# Patient Record
Sex: Female | Born: 1960 | Race: Black or African American | Hispanic: No | Marital: Single | Smoking: Former smoker
Health system: Southern US, Community
[De-identification: ages and names within clinical notes are randomized; demographics above are authoritative.]

## PROBLEM LIST (undated history)

## (undated) DIAGNOSIS — F22 Delusional disorders: Secondary | ICD-10-CM

## (undated) DIAGNOSIS — Z87442 Personal history of urinary calculi: Secondary | ICD-10-CM

## (undated) DIAGNOSIS — J45909 Unspecified asthma, uncomplicated: Secondary | ICD-10-CM

## (undated) DIAGNOSIS — E785 Hyperlipidemia, unspecified: Secondary | ICD-10-CM

## (undated) DIAGNOSIS — D509 Iron deficiency anemia, unspecified: Secondary | ICD-10-CM

## (undated) DIAGNOSIS — B1920 Unspecified viral hepatitis C without hepatic coma: Secondary | ICD-10-CM

## (undated) DIAGNOSIS — G4733 Obstructive sleep apnea (adult) (pediatric): Secondary | ICD-10-CM

## (undated) DIAGNOSIS — F250 Schizoaffective disorder, bipolar type: Secondary | ICD-10-CM

## (undated) DIAGNOSIS — E669 Obesity, unspecified: Secondary | ICD-10-CM

## (undated) DIAGNOSIS — F32A Depression, unspecified: Secondary | ICD-10-CM

## (undated) DIAGNOSIS — J449 Chronic obstructive pulmonary disease, unspecified: Secondary | ICD-10-CM

## (undated) DIAGNOSIS — E119 Type 2 diabetes mellitus without complications: Secondary | ICD-10-CM

## (undated) HISTORY — DX: Chronic obstructive pulmonary disease, unspecified: J44.9

## (undated) HISTORY — DX: Schizoaffective disorder, bipolar type: F25.0

## (undated) HISTORY — PX: UMBILICAL HERNIA REPAIR: SHX196

## (undated) HISTORY — PX: COLONOSCOPY: SHX174

## (undated) HISTORY — DX: Unspecified asthma, uncomplicated: J45.909

## (undated) HISTORY — DX: Obesity, unspecified: E66.9

## (undated) HISTORY — DX: Delusional disorders: F22

## (undated) HISTORY — DX: Obstructive sleep apnea (adult) (pediatric): G47.33

## (undated) HISTORY — DX: Type 2 diabetes mellitus without complications: E11.9

## (undated) HISTORY — DX: Iron deficiency anemia, unspecified: D50.9

## (undated) HISTORY — DX: Unspecified viral hepatitis C without hepatic coma: B19.20

## (undated) HISTORY — PX: CHOLECYSTECTOMY: SHX55

## (undated) HISTORY — DX: Hyperlipidemia, unspecified: E78.5

## (undated) HISTORY — DX: Depression, unspecified: F32.A

---

## 2000-06-19 HISTORY — PX: INGUINAL HERNIA REPAIR: SUR1180

## 2011-06-20 HISTORY — PX: BREAST CYST EXCISION: SHX579

## 2012-06-19 DIAGNOSIS — I639 Cerebral infarction, unspecified: Secondary | ICD-10-CM

## 2012-06-19 HISTORY — DX: Cerebral infarction, unspecified: I63.9

## 2018-06-19 HISTORY — PX: ESOPHAGOGASTRODUODENOSCOPY: SHX1529

## 2020-01-20 ENCOUNTER — Emergency Department: Payer: Medicaid Other

## 2020-01-20 ENCOUNTER — Inpatient Hospital Stay
Admission: EM | Admit: 2020-01-20 | Discharge: 2020-01-30 | DRG: 637 | Disposition: A | Discharge status: Home Health | Payer: Medicaid Other | Attending: Internal Medicine | Admitting: Internal Medicine

## 2020-01-20 DIAGNOSIS — R509 Fever, unspecified: Secondary | ICD-10-CM

## 2020-01-20 DIAGNOSIS — E1111 Type 2 diabetes mellitus with ketoacidosis with coma: Secondary | ICD-10-CM | POA: Diagnosis present

## 2020-01-20 DIAGNOSIS — E870 Hyperosmolality and hypernatremia: Secondary | ICD-10-CM | POA: Diagnosis not present

## 2020-01-20 DIAGNOSIS — Z6832 Body mass index (BMI) 32.0-32.9, adult: Secondary | ICD-10-CM | POA: Diagnosis not present

## 2020-01-20 DIAGNOSIS — N170 Acute kidney failure with tubular necrosis: Secondary | ICD-10-CM | POA: Diagnosis present

## 2020-01-20 DIAGNOSIS — G92 Toxic encephalopathy: Secondary | ICD-10-CM | POA: Diagnosis present

## 2020-01-20 DIAGNOSIS — Z794 Long term (current) use of insulin: Secondary | ICD-10-CM | POA: Diagnosis not present

## 2020-01-20 DIAGNOSIS — R140 Abdominal distension (gaseous): Secondary | ICD-10-CM

## 2020-01-20 DIAGNOSIS — E1110 Type 2 diabetes mellitus with ketoacidosis without coma: Secondary | ICD-10-CM | POA: Diagnosis present

## 2020-01-20 DIAGNOSIS — F319 Bipolar disorder, unspecified: Secondary | ICD-10-CM | POA: Diagnosis present

## 2020-01-20 DIAGNOSIS — J690 Pneumonitis due to inhalation of food and vomit: Secondary | ICD-10-CM | POA: Diagnosis present

## 2020-01-20 DIAGNOSIS — E871 Hypo-osmolality and hyponatremia: Secondary | ICD-10-CM | POA: Diagnosis present

## 2020-01-20 DIAGNOSIS — J9601 Acute respiratory failure with hypoxia: Secondary | ICD-10-CM | POA: Diagnosis present

## 2020-01-20 DIAGNOSIS — Z20822 Contact with and (suspected) exposure to covid-19: Secondary | ICD-10-CM | POA: Diagnosis present

## 2020-01-20 DIAGNOSIS — Z79899 Other long term (current) drug therapy: Secondary | ICD-10-CM | POA: Diagnosis not present

## 2020-01-20 DIAGNOSIS — E875 Hyperkalemia: Secondary | ICD-10-CM | POA: Diagnosis present

## 2020-01-20 DIAGNOSIS — E861 Hypovolemia: Secondary | ICD-10-CM | POA: Diagnosis present

## 2020-01-20 DIAGNOSIS — J9600 Acute respiratory failure, unspecified whether with hypoxia or hypercapnia: Secondary | ICD-10-CM

## 2020-01-20 DIAGNOSIS — R579 Shock, unspecified: Secondary | ICD-10-CM | POA: Diagnosis present

## 2020-01-20 DIAGNOSIS — Z9114 Patient's other noncompliance with medication regimen: Secondary | ICD-10-CM | POA: Diagnosis not present

## 2020-01-20 DIAGNOSIS — J9602 Acute respiratory failure with hypercapnia: Secondary | ICD-10-CM | POA: Diagnosis present

## 2020-01-20 DIAGNOSIS — E0811 Diabetes mellitus due to underlying condition with ketoacidosis with coma: Secondary | ICD-10-CM | POA: Diagnosis not present

## 2020-01-20 DIAGNOSIS — E872 Acidosis: Secondary | ICD-10-CM | POA: Diagnosis present

## 2020-01-20 DIAGNOSIS — Z978 Presence of other specified devices: Secondary | ICD-10-CM

## 2020-01-20 LAB — CBC
HCT: 40.1 % (ref 36.0–46.0)
Hemoglobin: 12.9 g/dL (ref 12.0–15.0)
MCH: 32 pg (ref 26.0–34.0)
MCHC: 32.2 g/dL (ref 30.0–36.0)
MCV: 99.5 fL (ref 80.0–100.0)
Platelets: 231 10*3/uL (ref 150–400)
RBC: 4.03 MIL/uL (ref 3.87–5.11)
RDW: 13.5 % (ref 11.5–15.5)
WBC: 15.1 10*3/uL — ABNORMAL HIGH (ref 4.0–10.5)
nRBC: 0.1 % (ref 0.0–0.2)

## 2020-01-20 LAB — BLOOD GAS, VENOUS
Acid-base deficit: 26 mmol/L — ABNORMAL HIGH (ref 0.0–2.0)
Bicarbonate: 6.1 mmol/L — ABNORMAL LOW (ref 20.0–28.0)
O2 Saturation: 13.1 %
Patient temperature: 37
pCO2, Ven: 31 mmHg — ABNORMAL LOW (ref 44.0–60.0)
pH, Ven: 6.9 — CL (ref 7.250–7.430)
pO2, Ven: <31 mmHg — CL (ref 32.0–45.0)

## 2020-01-20 LAB — COMPREHENSIVE METABOLIC PANEL
ALT: 23 U/L (ref 0–44)
AST: 26 U/L (ref 15–41)
Albumin: 4.2 g/dL (ref 3.5–5.0)
Alkaline Phosphatase: 89 U/L (ref 38–126)
BUN: 27 mg/dL — ABNORMAL HIGH (ref 6–20)
CO2: <7 mmol/L — ABNORMAL LOW (ref 22–32)
Calcium: 9.6 mg/dL (ref 8.9–10.3)
Chloride: 89 mmol/L — ABNORMAL LOW (ref 98–111)
Creatinine, Ser: 2.41 mg/dL — ABNORMAL HIGH (ref 0.44–1.00)
GFR calc Af Amer: 24 mL/min — ABNORMAL LOW (ref >60)
GFR calc non Af Amer: 21 mL/min — ABNORMAL LOW (ref >60)
Glucose, Bld: 800 mg/dL (ref 70–99)
Potassium: 6.1 mmol/L — ABNORMAL HIGH (ref 3.5–5.1)
Sodium: 123 mmol/L — ABNORMAL LOW (ref 135–145)
Total Bilirubin: 2.5 mg/dL — ABNORMAL HIGH (ref 0.3–1.2)
Total Protein: 7.6 g/dL (ref 6.5–8.1)

## 2020-01-20 LAB — TROPONIN I (HIGH SENSITIVITY): Troponin I (High Sensitivity): 15 ng/L (ref <18)

## 2020-01-20 LAB — LACTIC ACID, PLASMA: Lactic Acid, Venous: 3.5 mmol/L (ref 0.5–1.9)

## 2020-01-20 LAB — BETA-HYDROXYBUTYRIC ACID: Beta-Hydroxybutyric Acid: >8 mmol/L — ABNORMAL HIGH (ref 0.05–0.27)

## 2020-01-20 IMAGING — DX DG CHEST 1V PORT
1 series · 1 of 1 positions shown · non-contrast
Comparison: None.

CLINICAL DATA: Altered mental status

EXAM:
PORTABLE CHEST 1 VIEW

[chest ap]
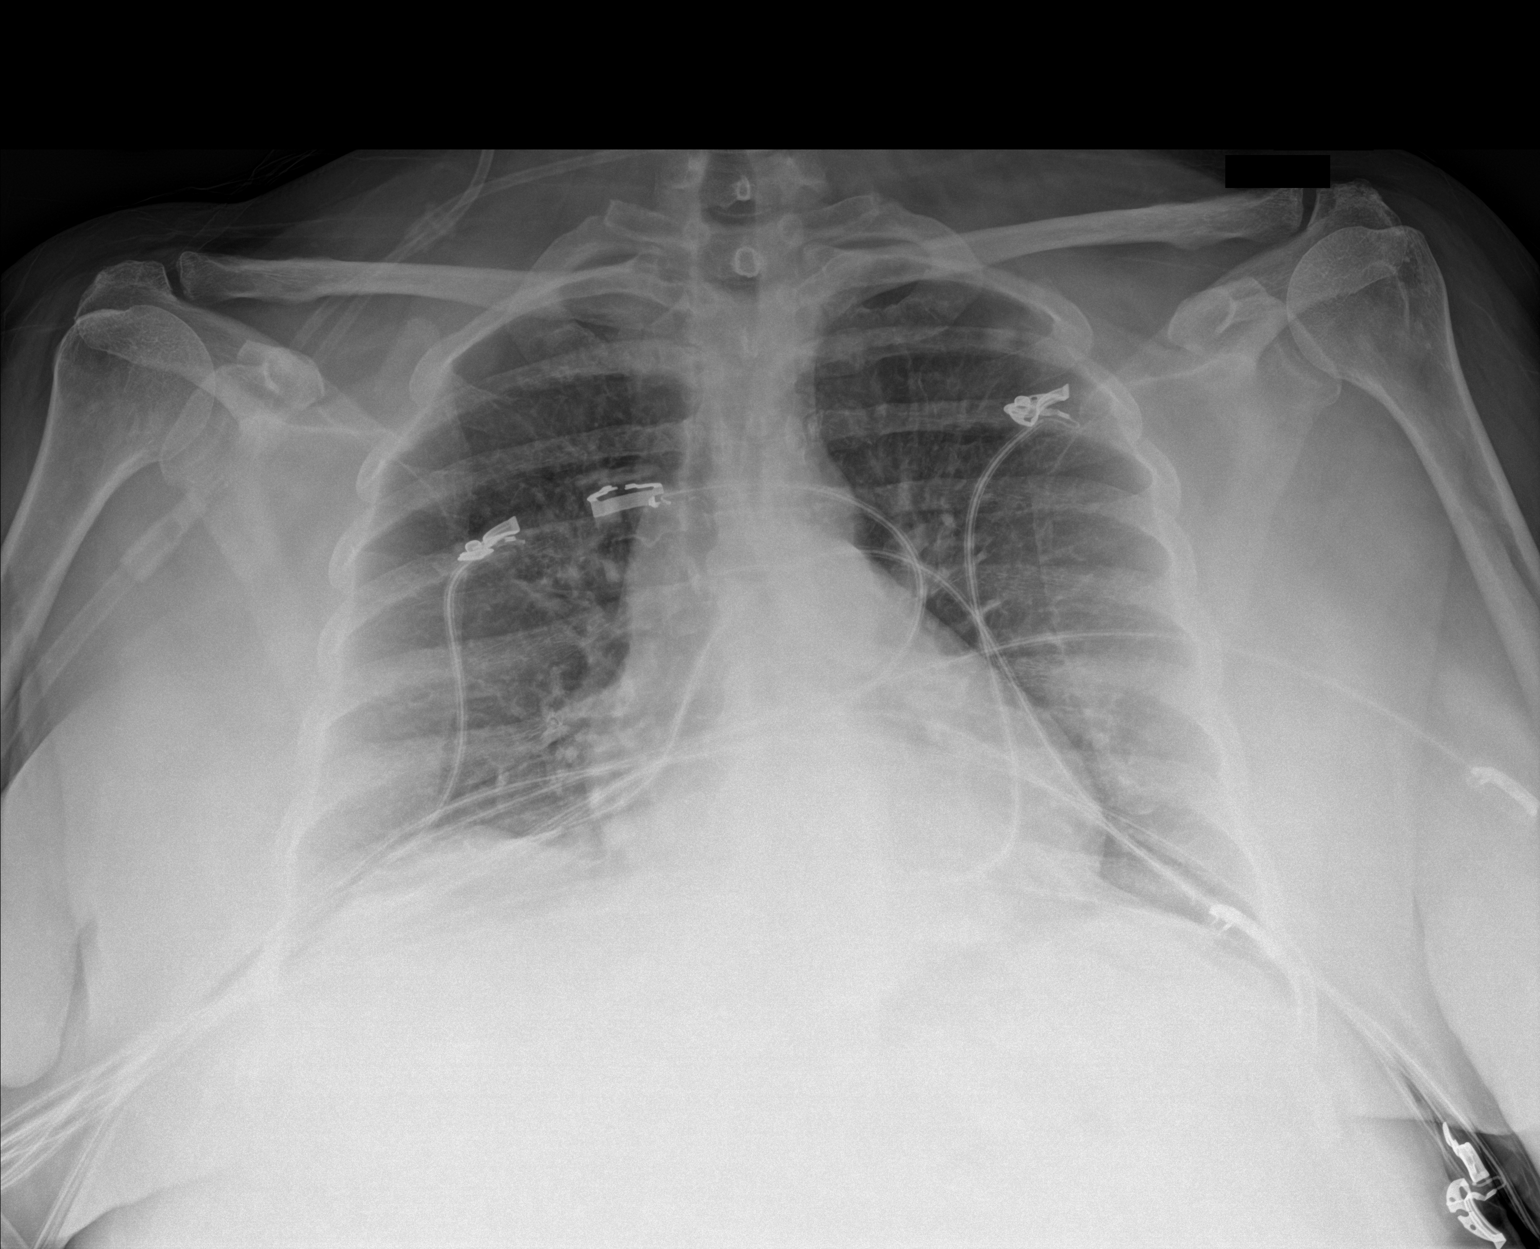

[1 of 1 positions shown; findings below may reference images not displayed]

FINDINGS: Possible patchy airspace opacity in the left lower lung. Normal
heart size. No pneumothorax.
IMPRESSION: Possible patchy airspace opacity/mild pneumonia in the left lower
lung.

## 2020-01-20 MED ORDER — SODIUM CHLORIDE 0.9 % IV SOLN: INTRAVENOUS | Status: DC

## 2020-01-20 MED ORDER — SODIUM CHLORIDE 0.9 % IV SOLN
1000.0000 mL | Freq: Once | INTRAVENOUS | Status: AC
  Administered 2020-01-20: 1000 mL via INTRAVENOUS

## 2020-01-20 MED ORDER — ONDANSETRON HCL 4 MG/2ML IJ SOLN
4.0000 mg | Freq: Four times a day (QID) | INTRAMUSCULAR | Status: DC | PRN
  Administered 2020-01-21: 4 mg via INTRAVENOUS
  Filled 2020-01-20: qty 2

## 2020-01-20 MED ORDER — ACETAMINOPHEN 325 MG PO TABS: 650.0000 mg | ORAL_TABLET | ORAL | Status: DC | PRN

## 2020-01-20 MED ORDER — INSULIN REGULAR(HUMAN) IN NACL 100-0.9 UT/100ML-% IV SOLN
INTRAVENOUS | Status: DC
  Administered 2020-01-21: 5 [IU]/h via INTRAVENOUS
  Administered 2020-01-21: 21 [IU]/h via INTRAVENOUS
  Administered 2020-01-21: 5 [IU]/h via INTRAVENOUS
  Administered 2020-01-21: 8 [IU]/h via INTRAVENOUS
  Administered 2020-01-22: 15 [IU]/h via INTRAVENOUS
  Administered 2020-01-22: 23 [IU]/h via INTRAVENOUS
  Filled 2020-01-20 (×5): qty 100

## 2020-01-20 MED ORDER — DEXTROSE-NACL 5-0.45 % IV SOLN: INTRAVENOUS | Status: DC

## 2020-01-20 MED ORDER — HEPARIN SODIUM (PORCINE) 5000 UNIT/ML IJ SOLN
5000.0000 [IU] | Freq: Three times a day (TID) | INTRAMUSCULAR | Status: DC
  Administered 2020-01-21 – 2020-01-28 (×21): 5000 [IU] via SUBCUTANEOUS
  Filled 2020-01-20 (×20): qty 1

## 2020-01-20 MED ORDER — DEXTROSE 50 % IV SOLN: 0.0000 mL | INTRAVENOUS | Status: DC | PRN

## 2020-01-20 NOTE — ED Notes (Signed)
Patient began to jump out out of the bed. Patient said I need to go to the bathroom. I assisted patient to bathroom and when getting her back in bed she was jumping out the bed. Doctor her at bedside.

## 2020-01-20 NOTE — ED Provider Notes (Signed)
[facility] Emergency Department Provider Note   ____________________________________________    I have reviewed the triage vital signs and the nursing notes.   HISTORY  Chief Complaint Hyperglycemia  History limited by altered mental status   HPI Natalie [name] is a 59 y.o. female brought in by EMS unresponsive.  Apparently son told him that patient recently moved from [place], has not yet obtained insurance here and has been out of her insulin.  They report elevated glucose.  No past medical history on file.  There are no problems to display for this patient.     Prior to Admission medications   Medication Sig Start Date End Date Taking? Authorizing Provider  atorvastatin (LIPITOR) 40 MG tablet Take 40 mg by mouth at bedtime. 01/15/20  Yes [provider]  benztropine (COGENTIN) 1 MG tablet Take 1 tablet by mouth daily. 01/15/20  Yes [provider]  citalopram (CELEXA) 20 MG tablet Take 20 mg by mouth every morning. 01/15/20  Yes [provider]  JARDIANCE 25 MG TABS tablet Take 25 mg by mouth daily. 01/15/20  Yes [provider]  latanoprost (XALATAN) 0.005 % ophthalmic solution Place 1 drop into both eyes at bedtime. 01/15/20  Yes [provider]  LORazepam (ATIVAN) 1 MG tablet Take 1 mg by mouth 2 (two) times daily. 12/17/19  Yes [provider]  Multiple Vitamin (DAILY-VITE MULTIVITAMIN) TABS Take 1 tablet by mouth daily. 01/15/20  Yes [provider]  TRELEGY ELLIPTA 100-62.5-25 MCG/INH AEPB Inhale 1 puff into the lungs daily. 01/15/20  Yes [provider]  TRULICITY 1.5 PP/2.9JJ SOPN Inject 1.5 mg into the skin once a week. 01/15/20  Yes [provider]  ziprasidone (GEODON) 40 MG capsule Take 1 capsule by mouth at bedtime. 01/19/20  Yes [provider]     Allergies Patient has no known allergies.  No family history on file.  Social History  unable to obtain Social History   Tobacco Use  . Smoking status: Not on file  Substance Use Topics  . Alcohol use: Not on file  . Drug use: Not on file    Unable to obtain review of Systems     ____________________________________________   PHYSICAL EXAM:  VITAL SIGNS: ED Triage Vitals  Enc Vitals Group     BP 01/20/20 2213 (!) 126/115     Pulse Rate 01/20/20 2213 (!) 104     Resp 01/20/20 2213 (!) 24     Temp 01/20/20 2216 97.6 F (36.4 C)     Temp Source 01/20/20 2216 Axillary     SpO2 01/20/20 2212 96 %     Weight 01/20/20 2214 90.7 kg (200 lb)     Height 01/20/20 2214 1.651 m (5\' 5" )     Head Circumference --      Peak Flow --      Pain Score --      Pain Loc --      Pain Edu? --      Excl. in [place]? --     Constitutional: Unresponsive Eyes: Conjunctivae are normal.  Pinpoint pupils Head: Atraumatic. Nose: No epistaxis Mouth/Throat: Mucous membranes are moist.    Cardiovascular: Tachycardia, regular rhythm. Grossly normal heart sounds.  Good peripheral circulation. Respiratory: Tachypnea.  No retractions. Lungs CTAB. Gastrointestinal: Soft and nontender. No distention.  No CVA tenderness. Musculoskeletal:   Warm and well perfused Neurologic: Moving all extremities Skin:  Skin is warm, dry and intact. No rash noted. Psychiatric:  Mood and affect are normal. Speech and behavior are normal.  ____________________________________________   LABS (all labs ordered are listed, but only abnormal results are displayed)  Labs Reviewed  CBC - Abnormal; Notable for the following components:      Result Value   WBC 15.1 (*)    All other components within normal limits  COMPREHENSIVE METABOLIC PANEL - Abnormal; Notable for the following components:   Sodium 123 (*)    Potassium 6.1 (*)    Chloride 89 (*)    CO2 <7 (*)    Glucose, Bld 800 (*)    BUN 27 (*)    Creatinine, Ser 2.41 (*)    Total Bilirubin 2.5 (*)    GFR calc non Af Amer 21 (*)    GFR calc Af  Amer 24 (*)    All other components within normal limits  BLOOD GAS, VENOUS - Abnormal; Notable for the following components:   pH, Ven 6.90 (*)    pCO2, Ven 31 (*)    pO2, Ven <31.0 (*)    Bicarbonate 6.1 (*)    Acid-base deficit 26.0 (*)    All other components within normal limits  LACTIC ACID, PLASMA - Abnormal; Notable for the following components:   Lactic Acid, Venous 3.5 (*)    All other components within normal limits  LACTIC ACID, PLASMA  BETA-HYDROXYBUTYRIC ACID  URINALYSIS, COMPLETE (UACMP) WITH MICROSCOPIC  TROPONIN I (HIGH SENSITIVITY)   ____________________________________________  EKG  ED ECG REPORT I, Lavonia Drafts, the attending physician, personally viewed and interpreted this ECG.  Date: 01/20/2020  Rhythm: Sinus tachycardia QRS Axis: normal Intervals: normal ST/T Wave abnormalities: Abnormal Narrative Interpretation: no evidence of acute ischemia  ____________________________________________  RADIOLOGY  Chest x-ray pending ____________________________________________   PROCEDURES  Procedure(s) performed: yes   Angiocath insertion Performed by: Lavonia Drafts  Consent: Verbal consent obtained. Risks and benefits: risks, benefits and alternatives were discussed Time out: Immediately prior to procedure a "time out" was called to verify the correct patient, procedure, equipment, support staff and site/side marked as required.  Preparation: Patient was prepped and draped in the usual sterile fashion.  Vein Location: right brachial  Ultrasound Guided  Gauge: 18  Normal blood return and flush without difficulty Patient tolerance: Patient tolerated the procedure well with no immediate complications.      Marland Kitchen1-3 Lead EKG Interpretation Performed by: Lavonia Drafts, MD Authorized by: Lavonia Drafts, MD     Interpretation: normal     ECG rate assessment: tachycardic     Rhythm: sinus tachycardia     Ectopy: none     Conduction: normal        Critical Care performed: yes  CRITICAL CARE Performed by: Lavonia Drafts   Total critical care time: 35 minutes  Critical care time was exclusive of separately billable procedures and treating other patients.  Critical care was necessary to treat or prevent imminent or life-threatening deterioration.  Critical care was time spent personally by me on the following activities: development of treatment plan with patient and/or surrogate as well as nursing, discussions with consultants, evaluation of patient's response to treatment, examination of patient, obtaining history from patient or surrogate, ordering and performing treatments and interventions, ordering and review of laboratory studies, ordering and review of radiographic studies, pulse oximetry and re-evaluation of patient's condition.  ____________________________________________   INITIAL IMPRESSION / ASSESSMENT AND PLAN / ED COURSE  Pertinent labs & imaging results that were available during my care of the patient were reviewed by  me and considered in my medical decision making (see chart for details).  Patient presents with altered mental status.  Found to have pinpoint pupils.  EMS has some concerns about track marks  Differential includes substance abuse, DKA, sepsis  Patient given 1 mg of IV Narcan with some improvement in responsiveness.  IV fluids infusing  Patient with likely Kussmaul breathing, strongly suspicious for DKA.  Glucometer reads greater than 600  VBG obtained demonstrates pH of 6.9  CMP is resulted demonstrating glucose of 800, sodium of 123, potassium 6.1, creatinine of 2.41  Consistent with DKA, insulin drip ordered  Discussed with son who confirms patient has been unable to get her medications  ----------------------------------------- 11:20 PM on 01/20/2020 -----------------------------------------  Patient is much more alert now, she has been able to sit on the toilet on her  own    ____________________________________________   FINAL CLINICAL IMPRESSION(S) / ED DIAGNOSES  Final diagnoses:  Diabetic ketoacidosis with coma associated with type 2 diabetes mellitus ([place])        Note:  This document was prepared using Dragon voice recognition software and may include unintentional dictation errors.   Lavonia Drafts, MD 01/20/20 [phone]

## 2020-01-20 NOTE — ED Triage Notes (Signed)
Pt from home via [facility] after being found tonight by son to be responsive. Pt family reports patient has been out of insulin for days and unable to get more.   Pt 70's% o2 RA per EMS 98% NRB  CBG 570 per EMS >600 on arrival  Pt presents with needle tracks on arms 1mg  narcan on arrival with slight improvement noted

## 2020-01-21 ENCOUNTER — Inpatient Hospital Stay: Payer: Medicaid Other

## 2020-01-21 DIAGNOSIS — E1110 Type 2 diabetes mellitus with ketoacidosis without coma: Secondary | ICD-10-CM

## 2020-01-21 DIAGNOSIS — J9601 Acute respiratory failure with hypoxia: Secondary | ICD-10-CM

## 2020-01-21 LAB — BASIC METABOLIC PANEL
Anion gap: 24 — ABNORMAL HIGH (ref 5–15)
Anion gap: 21 — ABNORMAL HIGH (ref 5–15)
Anion gap: 17 — ABNORMAL HIGH (ref 5–15)
Anion gap: 18 — ABNORMAL HIGH (ref 5–15)
BUN: 29 mg/dL — ABNORMAL HIGH (ref 6–20)
BUN: 37 mg/dL — ABNORMAL HIGH (ref 6–20)
BUN: 40 mg/dL — ABNORMAL HIGH (ref 6–20)
BUN: 41 mg/dL — ABNORMAL HIGH (ref 6–20)
BUN: 43 mg/dL — ABNORMAL HIGH (ref 6–20)
CO2: <7 mmol/L — ABNORMAL LOW (ref 22–32)
CO2: 14 mmol/L — ABNORMAL LOW (ref 22–32)
CO2: 17 mmol/L — ABNORMAL LOW (ref 22–32)
CO2: 20 mmol/L — ABNORMAL LOW (ref 22–32)
CO2: 17 mmol/L — ABNORMAL LOW (ref 22–32)
Calcium: 8.6 mg/dL — ABNORMAL LOW (ref 8.9–10.3)
Calcium: 8.6 mg/dL — ABNORMAL LOW (ref 8.9–10.3)
Calcium: 8.3 mg/dL — ABNORMAL LOW (ref 8.9–10.3)
Calcium: 8.3 mg/dL — ABNORMAL LOW (ref 8.9–10.3)
Calcium: 7.9 mg/dL — ABNORMAL LOW (ref 8.9–10.3)
Chloride: 96 mmol/L — ABNORMAL LOW (ref 98–111)
Chloride: 97 mmol/L — ABNORMAL LOW (ref 98–111)
Chloride: 98 mmol/L (ref 98–111)
Chloride: 99 mmol/L (ref 98–111)
Chloride: 101 mmol/L (ref 98–111)
Creatinine, Ser: 2.25 mg/dL — ABNORMAL HIGH (ref 0.44–1.00)
Creatinine, Ser: 2.43 mg/dL — ABNORMAL HIGH (ref 0.44–1.00)
Creatinine, Ser: 2.66 mg/dL — ABNORMAL HIGH (ref 0.44–1.00)
Creatinine, Ser: 3.08 mg/dL — ABNORMAL HIGH (ref 0.44–1.00)
Creatinine, Ser: 3.27 mg/dL — ABNORMAL HIGH (ref 0.44–1.00)
GFR calc Af Amer: 27 mL/min — ABNORMAL LOW (ref >60)
GFR calc Af Amer: 24 mL/min — ABNORMAL LOW (ref >60)
GFR calc Af Amer: 22 mL/min — ABNORMAL LOW (ref >60)
GFR calc Af Amer: 18 mL/min — ABNORMAL LOW (ref >60)
GFR calc Af Amer: 17 mL/min — ABNORMAL LOW (ref >60)
GFR calc non Af Amer: 23 mL/min — ABNORMAL LOW (ref >60)
GFR calc non Af Amer: 21 mL/min — ABNORMAL LOW (ref >60)
GFR calc non Af Amer: 19 mL/min — ABNORMAL LOW (ref >60)
GFR calc non Af Amer: 16 mL/min — ABNORMAL LOW (ref >60)
GFR calc non Af Amer: 15 mL/min — ABNORMAL LOW (ref >60)
Glucose, Bld: 720 mg/dL (ref 70–99)
Glucose, Bld: 403 mg/dL — ABNORMAL HIGH (ref 70–99)
Glucose, Bld: 262 mg/dL — ABNORMAL HIGH (ref 70–99)
Glucose, Bld: 250 mg/dL — ABNORMAL HIGH (ref 70–99)
Glucose, Bld: 298 mg/dL — ABNORMAL HIGH (ref 70–99)
Potassium: 4.9 mmol/L (ref 3.5–5.1)
Potassium: 3.9 mmol/L (ref 3.5–5.1)
Potassium: 3.2 mmol/L — ABNORMAL LOW (ref 3.5–5.1)
Potassium: 3.8 mmol/L (ref 3.5–5.1)
Potassium: 3.6 mmol/L (ref 3.5–5.1)
Sodium: 126 mmol/L — ABNORMAL LOW (ref 135–145)
Sodium: 135 mmol/L (ref 135–145)
Sodium: 136 mmol/L (ref 135–145)
Sodium: 136 mmol/L (ref 135–145)
Sodium: 136 mmol/L (ref 135–145)

## 2020-01-21 LAB — GLUCOSE, CAPILLARY
Glucose-Capillary: >600 mg/dL (ref 70–99)
Glucose-Capillary: 491 mg/dL — ABNORMAL HIGH (ref 70–99)
Glucose-Capillary: 383 mg/dL — ABNORMAL HIGH (ref 70–99)
Glucose-Capillary: 396 mg/dL — ABNORMAL HIGH (ref 70–99)
Glucose-Capillary: 351 mg/dL — ABNORMAL HIGH (ref 70–99)
Glucose-Capillary: 308 mg/dL — ABNORMAL HIGH (ref 70–99)
Glucose-Capillary: 310 mg/dL — ABNORMAL HIGH (ref 70–99)
Glucose-Capillary: 254 mg/dL — ABNORMAL HIGH (ref 70–99)
Glucose-Capillary: 268 mg/dL — ABNORMAL HIGH (ref 70–99)
Glucose-Capillary: 220 mg/dL — ABNORMAL HIGH (ref 70–99)
Glucose-Capillary: 239 mg/dL — ABNORMAL HIGH (ref 70–99)
Glucose-Capillary: 262 mg/dL — ABNORMAL HIGH (ref 70–99)
Glucose-Capillary: 302 mg/dL — ABNORMAL HIGH (ref 70–99)
Glucose-Capillary: 290 mg/dL — ABNORMAL HIGH (ref 70–99)
Glucose-Capillary: 292 mg/dL — ABNORMAL HIGH (ref 70–99)
Glucose-Capillary: 316 mg/dL — ABNORMAL HIGH (ref 70–99)
Glucose-Capillary: 344 mg/dL — ABNORMAL HIGH (ref 70–99)

## 2020-01-21 LAB — BLOOD GAS, ARTERIAL
Acid-base deficit: 12.5 mmol/L — ABNORMAL HIGH (ref 0.0–2.0)
Allens test (pass/fail): POSITIVE — AB
Bicarbonate: 14.3 mmol/L — ABNORMAL LOW (ref 20.0–28.0)
FIO2: 0.28
MECHVT: 500 mL
O2 Saturation: 96.3 %
PEEP: 5 cmH2O
Patient temperature: 37
RATE: 18 resp/min
pCO2 arterial: 35 mmHg (ref 32.0–48.0)
pH, Arterial: 7.22 — ABNORMAL LOW (ref 7.350–7.450)
pO2, Arterial: 100 mmHg (ref 83.0–108.0)

## 2020-01-21 LAB — PHOSPHORUS: Phosphorus: 6 mg/dL — ABNORMAL HIGH (ref 2.5–4.6)

## 2020-01-21 LAB — URINALYSIS, COMPLETE (UACMP) WITH MICROSCOPIC
Bilirubin Urine: NEGATIVE
Glucose, UA: >=500 mg/dL — AB
Ketones, ur: 80 mg/dL — AB
Leukocytes,Ua: NEGATIVE
Nitrite: NEGATIVE
Protein, ur: 100 mg/dL — AB
Specific Gravity, Urine: 1.016 (ref 1.005–1.030)
pH: 5 (ref 5.0–8.0)

## 2020-01-21 LAB — ETHANOL: Alcohol, Ethyl (B): <10 mg/dL (ref <10)

## 2020-01-21 LAB — URINE DRUG SCREEN, QUALITATIVE (ARMC ONLY)
Amphetamines, Ur Screen: NOT DETECTED
Barbiturates, Ur Screen: NOT DETECTED
Benzodiazepine, Ur Scrn: NOT DETECTED
Cannabinoid 50 Ng, Ur ~~LOC~~: NOT DETECTED
Cocaine Metabolite,Ur ~~LOC~~: NOT DETECTED
MDMA (Ecstasy)Ur Screen: NOT DETECTED
Methadone Scn, Ur: NOT DETECTED
Opiate, Ur Screen: NOT DETECTED
Phencyclidine (PCP) Ur S: NOT DETECTED
Tricyclic, Ur Screen: NOT DETECTED

## 2020-01-21 LAB — HEMOGLOBIN A1C
Hgb A1c MFr Bld: 11.7 % — ABNORMAL HIGH (ref 4.8–5.6)
Mean Plasma Glucose: 289.09 mg/dL

## 2020-01-21 LAB — CBC
HCT: 40.6 % (ref 36.0–46.0)
Hemoglobin: 13.4 g/dL (ref 12.0–15.0)
MCH: 32 pg (ref 26.0–34.0)
MCHC: 33 g/dL (ref 30.0–36.0)
MCV: 96.9 fL (ref 80.0–100.0)
Platelets: 169 10*3/uL (ref 150–400)
RBC: 4.19 MIL/uL (ref 3.87–5.11)
RDW: 13.2 % (ref 11.5–15.5)
WBC: 13.6 10*3/uL — ABNORMAL HIGH (ref 4.0–10.5)
nRBC: 0.1 % (ref 0.0–0.2)

## 2020-01-21 LAB — HIV ANTIBODY (ROUTINE TESTING W REFLEX): HIV Screen 4th Generation wRfx: NONREACTIVE

## 2020-01-21 LAB — TRIGLYCERIDES: Triglycerides: 782 mg/dL — ABNORMAL HIGH (ref <150)

## 2020-01-21 LAB — MAGNESIUM: Magnesium: 2.8 mg/dL — ABNORMAL HIGH (ref 1.7–2.4)

## 2020-01-21 LAB — SARS CORONAVIRUS 2 BY RT PCR (HOSPITAL ORDER, PERFORMED IN ~~LOC~~ HOSPITAL LAB): SARS Coronavirus 2: NEGATIVE

## 2020-01-21 LAB — LACTIC ACID, PLASMA: Lactic Acid, Venous: 2 mmol/L (ref 0.5–1.9)

## 2020-01-21 LAB — PROCALCITONIN: Procalcitonin: 2.2 ng/mL

## 2020-01-21 IMAGING — DX DG CHEST 1V PORT
2 series · 2 of 2 positions shown · non-contrast
Comparison: [DATE]

CLINICAL DATA: Endotracheal and NG tube placements

EXAM:
PORTABLE CHEST 1 VIEW

[chest ap (1 of 2)]
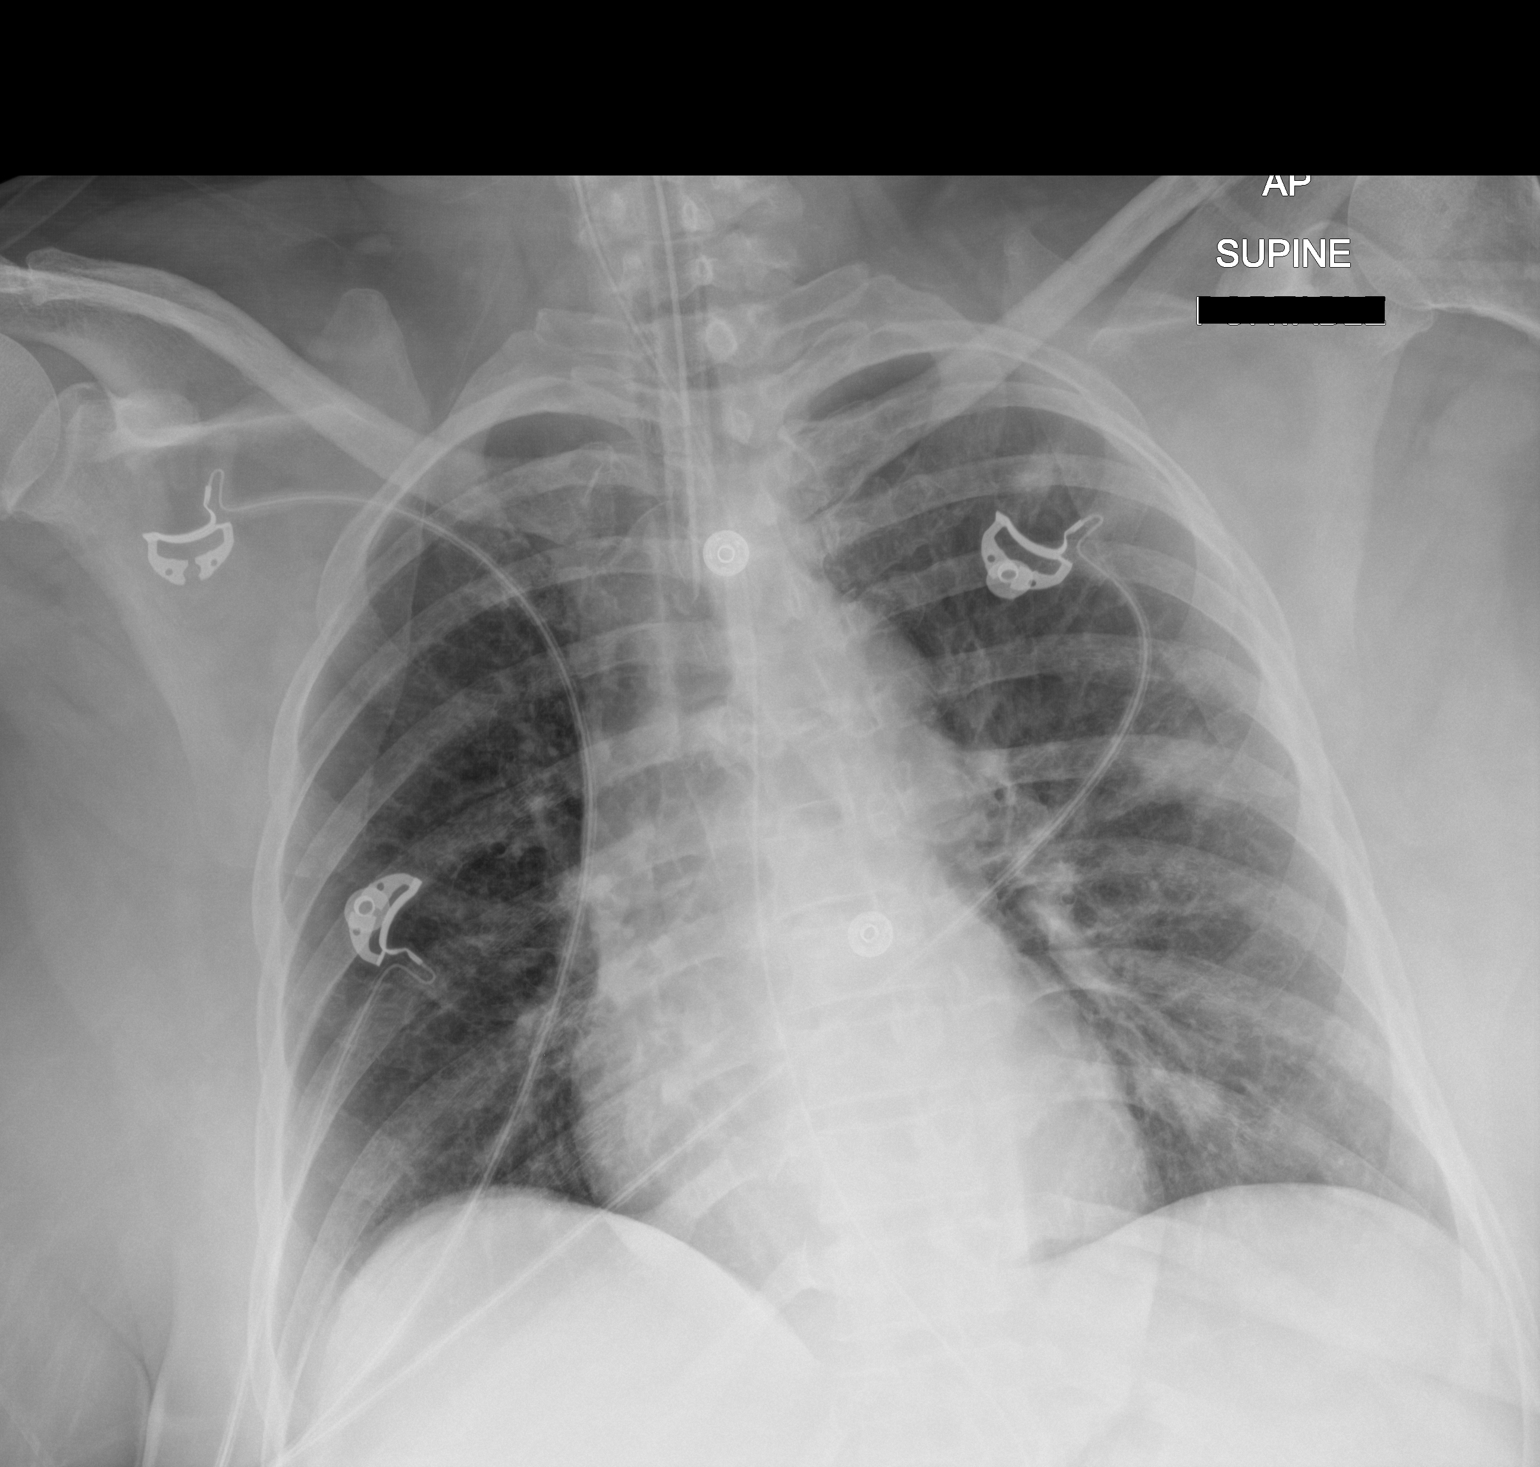

[chest ap (2 of 2)]
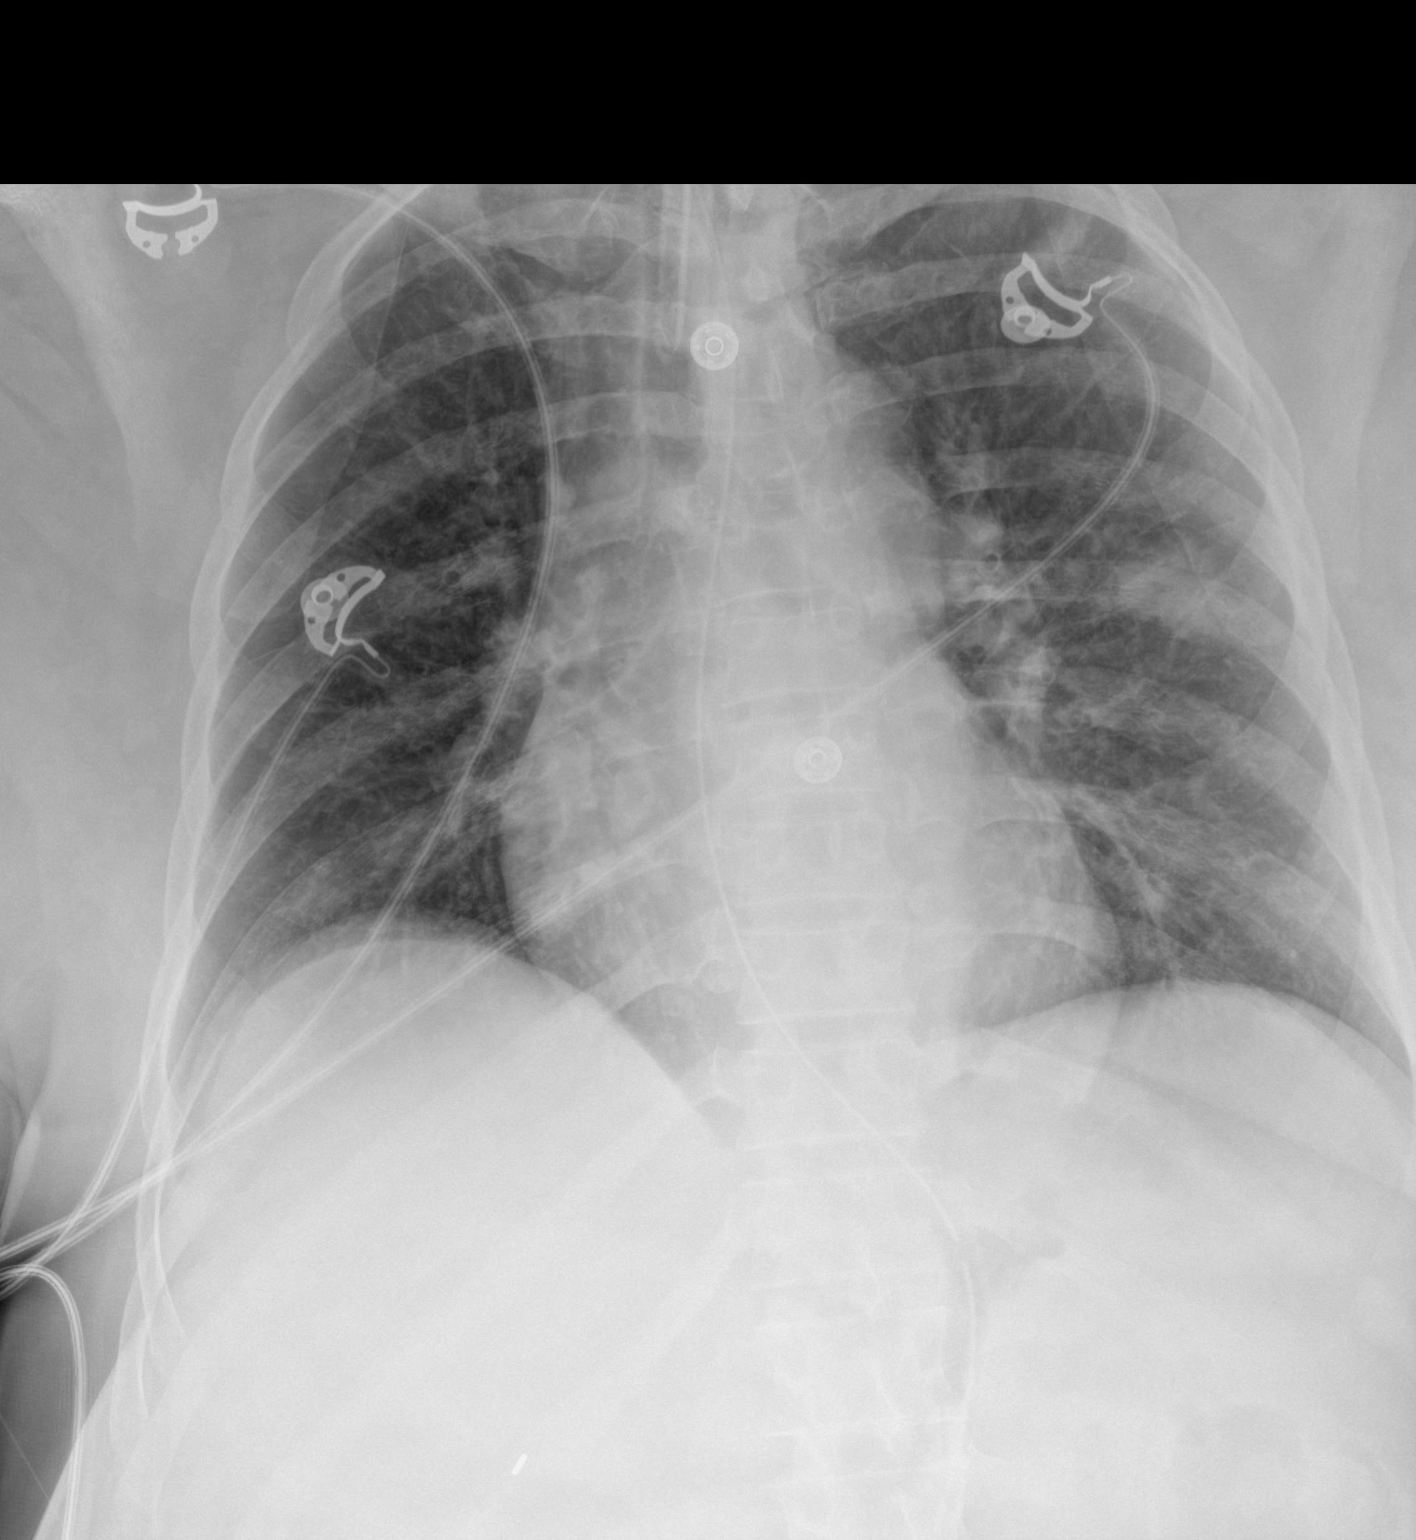

[2 of 2 positions shown; findings below may reference images not displayed]

FINDINGS: Endotracheal tube placed with tip measuring 4.2 cm above the carina.
Enteric tube tip is projected over the upper mid abdomen consistent
location in the upper stomach. Shallow inspiration. Heart size and
pulmonary vascularity are normal. Nodular infiltration in the left
mid and lower lung zone.
IMPRESSION: Appliances appear in satisfactory location. Nodular infiltration in
the left mid and lower lung zone.

## 2020-01-21 IMAGING — CT CT HEAD W/O CM
2 series · 16 of 39 positions shown, 20 images · non-contrast
Comparison: None.

CLINICAL DATA: Severe agitation.  Delirium.

EXAM:
CT HEAD WITHOUT CONTRAST
TECHNIQUE: Contiguous axial images were obtained from the base of the skull
through the vertex without intravenous contrast.

[Series 3: head wo · axial · 0.41mm/px · z∈[-107,+13]mm · 13 of 29 slices shown, 17 images]
[im 3/29  brain]
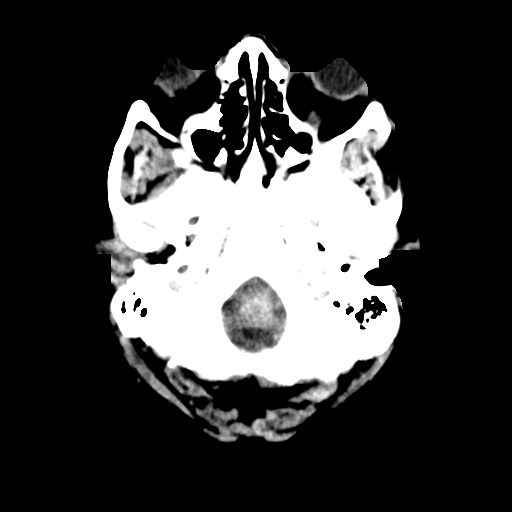
[im 3/29  bone]
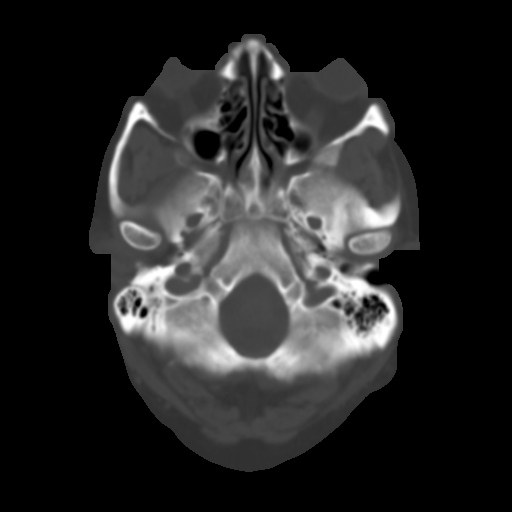
[im 5/29  brain]
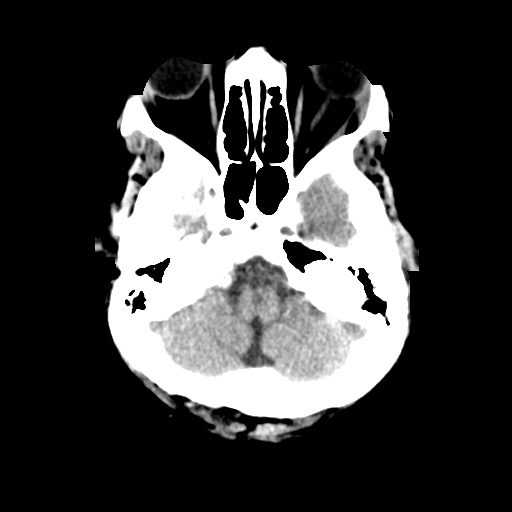
[im 7/29  brain]
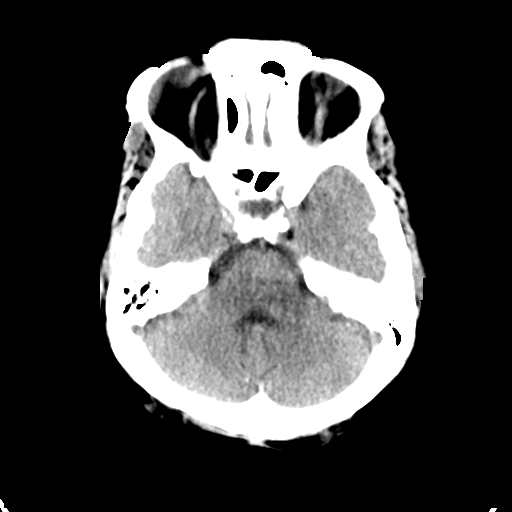
[im 9/29  brain]
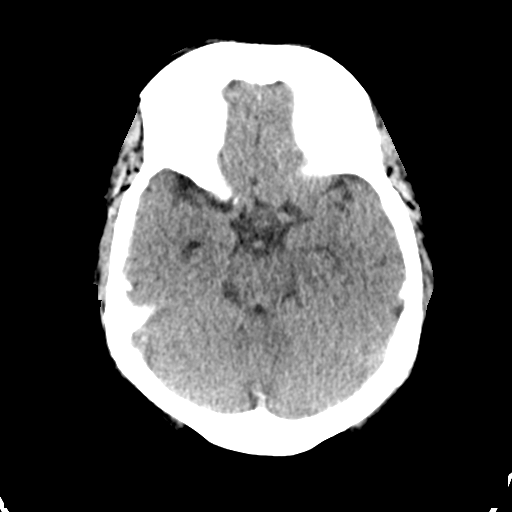
[im 11/29  brain]
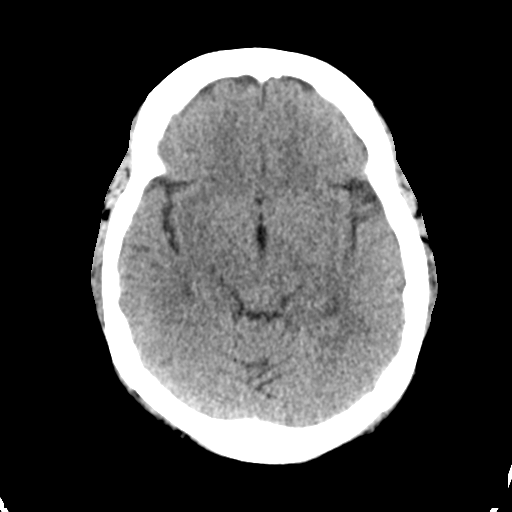
[im 11/29  bone]
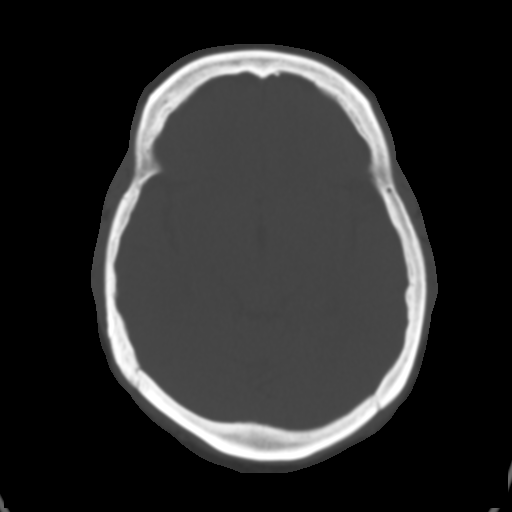
[im 13/29  brain]
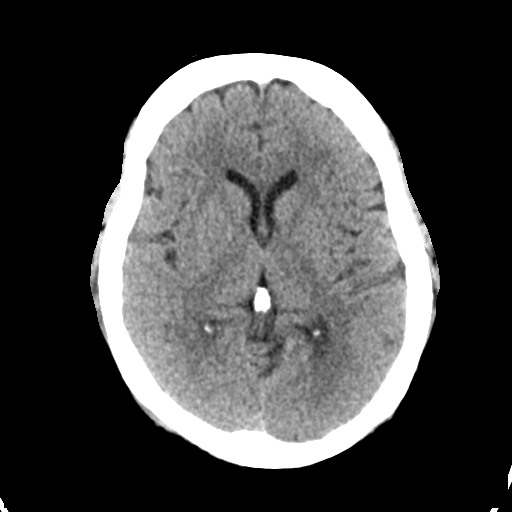
[im 15/29  brain]
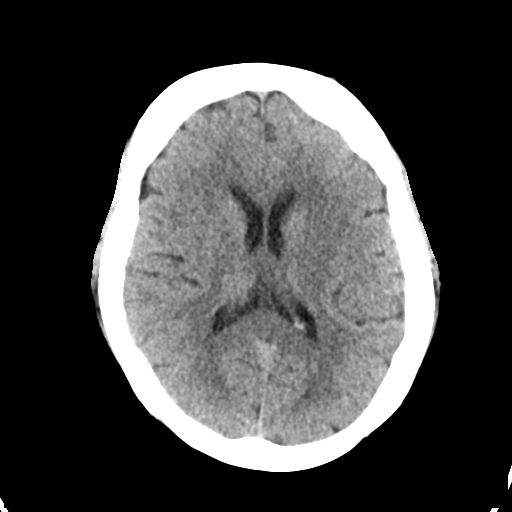
[im 17/29  brain]
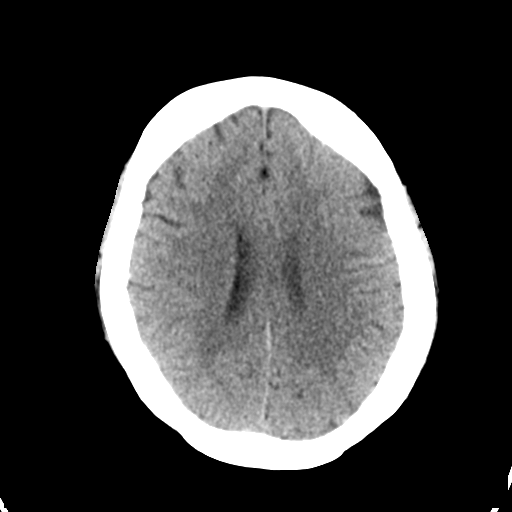
[im 19/29  brain]
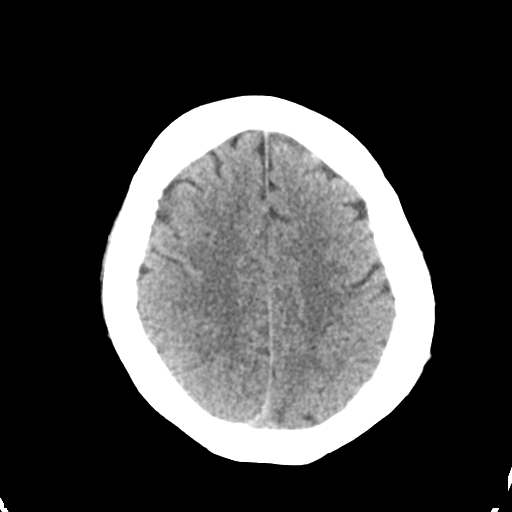
[im 19/29  bone]
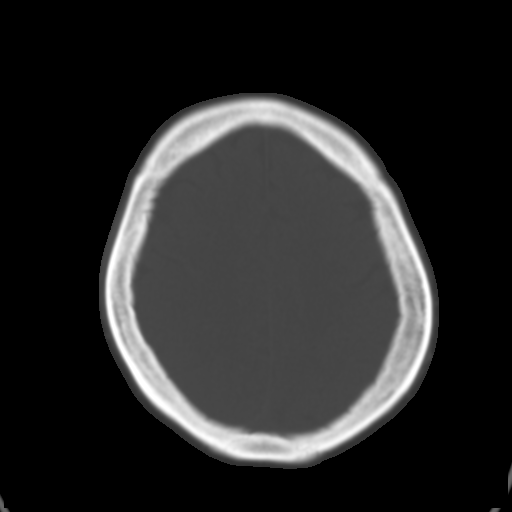
[im 21/29  brain]
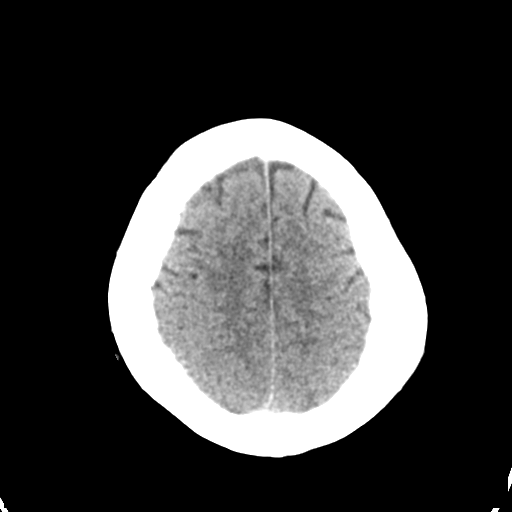
[im 23/29  brain]
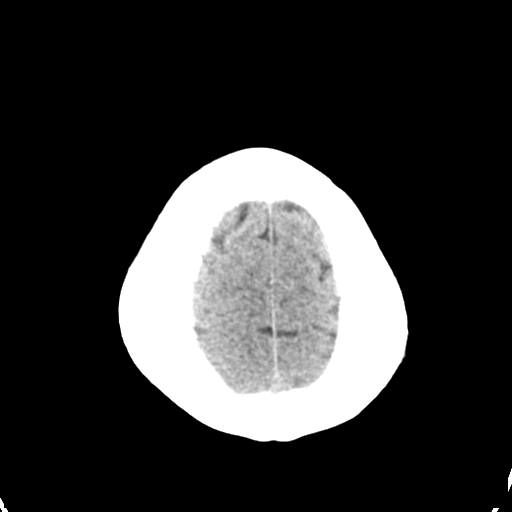
[im 25/29  brain]
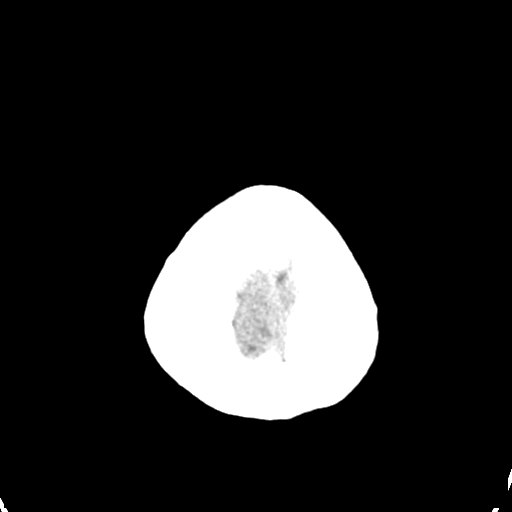
[im 27/29  brain]
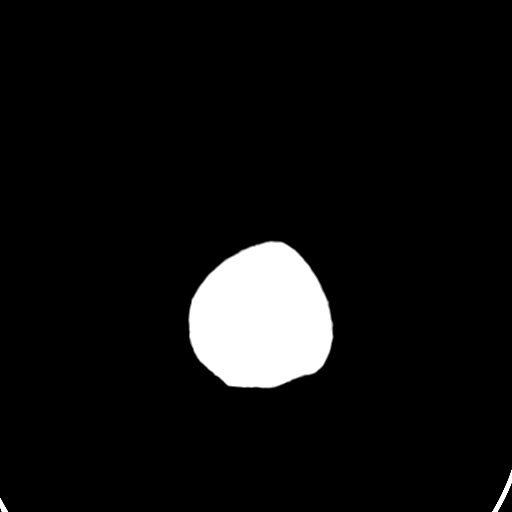
[im 27/29  bone]
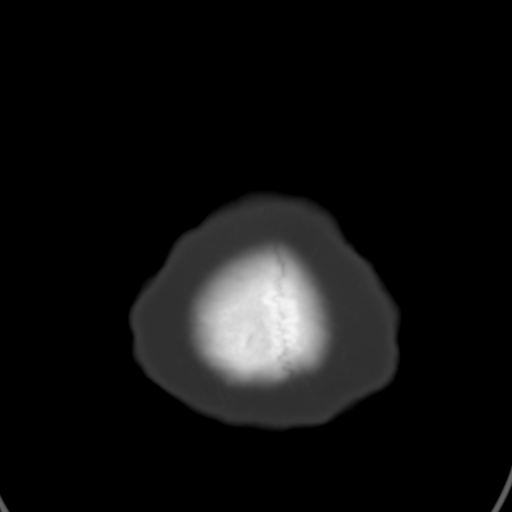

[Series 4: coronal soft tissue · coronal · 0.29mm/px · 3 of 63 slices shown]
[im 21/63  brain]
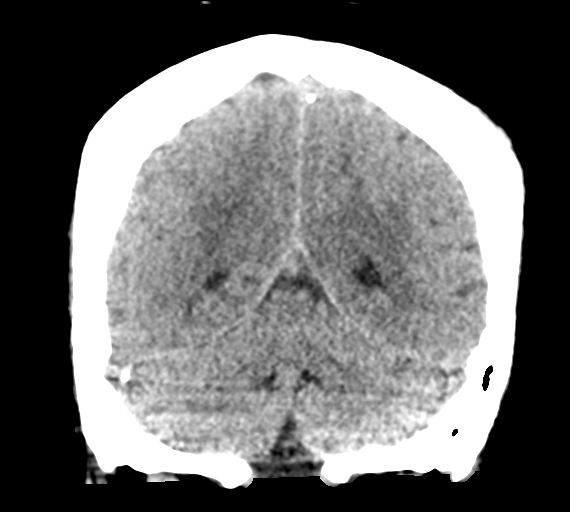
[im 28/63  brain]
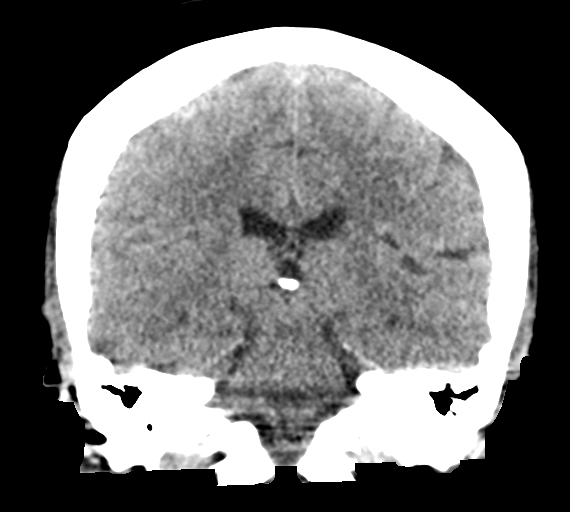
[im 35/63  brain]
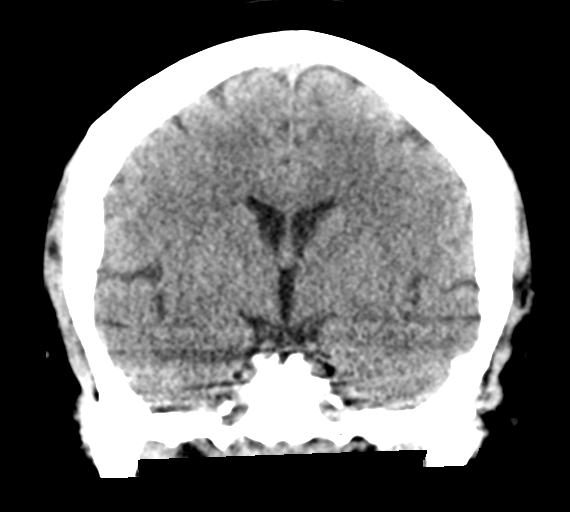

[16 of 39 positions shown; findings below may reference images not displayed]

FINDINGS: Brain: No evidence of acute infarction, hemorrhage, hydrocephalus,
extra-axial collection or mass lesion/mass effect.

Vascular: No hyperdense vessel or unexpected calcification.

Skull: Normal. Negative for fracture or focal lesion.

Sinuses/Orbits: No acute finding.
IMPRESSION: Negative head CT.

## 2020-01-21 MED ORDER — POTASSIUM CHLORIDE 20 MEQ PO PACK
20.0000 meq | PACK | Freq: Once | ORAL | Status: AC
  Administered 2020-01-21: 20 meq
  Filled 2020-01-21: qty 1

## 2020-01-21 MED ORDER — VITAL HIGH PROTEIN PO LIQD: 1000.0000 mL | ORAL | Status: DC

## 2020-01-21 MED ORDER — FENTANYL BOLUS VIA INFUSION
50.0000 ug | INTRAVENOUS | Status: DC | PRN
  Administered 2020-01-22: 50 ug via INTRAVENOUS
  Filled 2020-01-21: qty 50

## 2020-01-21 MED ORDER — ROCURONIUM BROMIDE 50 MG/5ML IV SOLN
INTRAVENOUS | Status: AC
  Administered 2020-01-21: 50 mg via INTRAVENOUS
  Filled 2020-01-21: qty 1

## 2020-01-21 MED ORDER — FENTANYL CITRATE (PF) 100 MCG/2ML IJ SOLN: 100.0000 ug | INTRAMUSCULAR | Status: AC

## 2020-01-21 MED ORDER — DOCUSATE SODIUM 50 MG/5ML PO LIQD
100.0000 mg | Freq: Two times a day (BID) | ORAL | Status: DC
  Administered 2020-01-21 – 2020-01-27 (×9): 100 mg via ORAL
  Filled 2020-01-21 (×17): qty 10

## 2020-01-21 MED ORDER — SODIUM CHLORIDE 0.9% FLUSH: 10.0000 mL | INTRAVENOUS | Status: DC | PRN

## 2020-01-21 MED ORDER — MIDAZOLAM HCL 2 MG/2ML IJ SOLN
2.0000 mg | INTRAMUSCULAR | Status: AC | PRN
  Administered 2020-01-21 (×3): 2 mg via INTRAVENOUS
  Filled 2020-01-21 (×3): qty 2

## 2020-01-21 MED ORDER — POLYETHYLENE GLYCOL 3350 17 G PO PACK
17.0000 g | PACK | Freq: Every day | ORAL | Status: DC
  Administered 2020-01-22 – 2020-01-30 (×6): 17 g via ORAL
  Filled 2020-01-21 (×9): qty 1

## 2020-01-21 MED ORDER — ETOMIDATE 2 MG/ML IV SOLN
INTRAVENOUS | Status: AC
  Administered 2020-01-21: 20 mg via INTRAVENOUS
  Filled 2020-01-21: qty 10

## 2020-01-21 MED ORDER — SODIUM BICARBONATE-DEXTROSE 150-5 MEQ/L-% IV SOLN
150.0000 meq | INTRAVENOUS | Status: DC
  Administered 2020-01-21 – 2020-01-22 (×2): 150 meq via INTRAVENOUS
  Filled 2020-01-21 (×9): qty 1000
  Filled 2020-01-21: qty 150

## 2020-01-21 MED ORDER — FENTANYL CITRATE (PF) 100 MCG/2ML IJ SOLN
INTRAMUSCULAR | Status: AC
  Administered 2020-01-21: 100 ug via INTRAVENOUS
  Filled 2020-01-21: qty 2

## 2020-01-21 MED ORDER — POTASSIUM CHLORIDE 20 MEQ PO PACK
20.0000 meq | PACK | Freq: Once | ORAL | Status: AC
  Administered 2020-01-21: 20 meq

## 2020-01-21 MED ORDER — SODIUM CHLORIDE 0.9 % IV SOLN
2.0000 g | Freq: Two times a day (BID) | INTRAVENOUS | Status: DC
  Administered 2020-01-21: 2 g via INTRAVENOUS
  Filled 2020-01-21 (×3): qty 2

## 2020-01-21 MED ORDER — NOREPINEPHRINE 4 MG/250ML-% IV SOLN
2.0000 ug/min | INTRAVENOUS | Status: DC
  Administered 2020-01-21: 2 ug/min via INTRAVENOUS
  Filled 2020-01-21 (×2): qty 250

## 2020-01-21 MED ORDER — DEXMEDETOMIDINE HCL IN NACL 400 MCG/100ML IV SOLN
0.4000 ug/kg/h | INTRAVENOUS | Status: DC
  Administered 2020-01-21: 0.8 ug/kg/h via INTRAVENOUS

## 2020-01-21 MED ORDER — FENTANYL 2500MCG IN NS 250ML (10MCG/ML) PREMIX INFUSION
0.0000 ug/h | INTRAVENOUS | Status: DC
  Administered 2020-01-21: 50 ug/h via INTRAVENOUS
  Administered 2020-01-21 – 2020-01-22 (×2): 300 ug/h via INTRAVENOUS
  Administered 2020-01-22 (×2): 350 ug/h via INTRAVENOUS
  Administered 2020-01-23: 250 ug/h via INTRAVENOUS
  Filled 2020-01-21 (×7): qty 250

## 2020-01-21 MED ORDER — CHLORHEXIDINE GLUCONATE CLOTH 2 % EX PADS
6.0000 | MEDICATED_PAD | Freq: Every day | CUTANEOUS | Status: DC
  Administered 2020-01-22 – 2020-01-30 (×7): 6 via TOPICAL

## 2020-01-21 MED ORDER — PANTOPRAZOLE SODIUM 40 MG PO PACK
40.0000 mg | PACK | Freq: Every day | ORAL | Status: DC
  Administered 2020-01-21 – 2020-01-25 (×5): 40 mg
  Filled 2020-01-21 (×5): qty 20

## 2020-01-21 MED ORDER — STERILE WATER FOR INJECTION IV SOLN
INTRAVENOUS | Status: DC
  Filled 2020-01-21: qty 150
  Filled 2020-01-21 (×6): qty 850

## 2020-01-21 MED ORDER — FENTANYL CITRATE (PF) 100 MCG/2ML IJ SOLN
50.0000 ug | Freq: Once | INTRAMUSCULAR | Status: DC
  Filled 2020-01-21: qty 2

## 2020-01-21 MED ORDER — SODIUM CHLORIDE 0.9 % IV SOLN
250.0000 mL | INTRAVENOUS | Status: DC
  Administered 2020-01-22: 250 mL via INTRAVENOUS

## 2020-01-21 MED ORDER — VITAL AF 1.2 CAL PO LIQD
1000.0000 mL | ORAL | Status: DC
  Administered 2020-01-21 – 2020-01-22 (×2): 1000 mL

## 2020-01-21 MED ORDER — ROCURONIUM BROMIDE 50 MG/5ML IV SOLN: 50.0000 mg | Freq: Once | INTRAVENOUS | Status: AC

## 2020-01-21 MED ORDER — MIDAZOLAM HCL 2 MG/2ML IJ SOLN
2.0000 mg | INTRAMUSCULAR | Status: DC | PRN
  Administered 2020-01-21 – 2020-01-24 (×9): 2 mg via INTRAVENOUS
  Filled 2020-01-21 (×9): qty 2

## 2020-01-21 MED ORDER — SODIUM BICARBONATE 8.4 % IV SOLN
150.0000 meq | Freq: Once | INTRAVENOUS | Status: AC
  Administered 2020-01-21: 150 meq via INTRAVENOUS
  Filled 2020-01-21: qty 50

## 2020-01-21 MED ORDER — SODIUM BICARBONATE-DEXTROSE 150-5 MEQ/L-% IV SOLN
150.0000 meq | INTRAVENOUS | Status: DC
  Filled 2020-01-21: qty 1000

## 2020-01-21 MED ORDER — SODIUM CHLORIDE 0.9% FLUSH
10.0000 mL | Freq: Two times a day (BID) | INTRAVENOUS | Status: DC
  Administered 2020-01-21 – 2020-01-30 (×14): 10 mL

## 2020-01-21 MED ORDER — PROPOFOL 1000 MG/100ML IV EMUL
5.0000 ug/kg/min | INTRAVENOUS | Status: DC
  Administered 2020-01-21: 10 ug/kg/min via INTRAVENOUS
  Administered 2020-01-21: 30 ug/kg/min via INTRAVENOUS
  Administered 2020-01-22 (×2): 70 ug/kg/min via INTRAVENOUS
  Administered 2020-01-22: 80 ug/kg/min via INTRAVENOUS
  Administered 2020-01-22: 60 ug/kg/min via INTRAVENOUS
  Filled 2020-01-21 (×7): qty 100

## 2020-01-21 MED ORDER — POTASSIUM CHLORIDE 20 MEQ PO PACK: 20.0000 meq | PACK | Freq: Once | ORAL | Status: DC

## 2020-01-21 MED ORDER — POTASSIUM CHLORIDE 20 MEQ PO PACK
40.0000 meq | PACK | ORAL | Status: AC
  Administered 2020-01-21 (×2): 40 meq
  Filled 2020-01-21 (×2): qty 2

## 2020-01-21 MED ORDER — ORAL CARE MOUTH RINSE
15.0000 mL | OROMUCOSAL | Status: DC
  Administered 2020-01-21 – 2020-01-26 (×40): 15 mL via OROMUCOSAL

## 2020-01-21 MED ORDER — SODIUM CHLORIDE 0.9 % IV SOLN
2.0000 g | INTRAVENOUS | Status: DC
  Administered 2020-01-22: 2 g via INTRAVENOUS
  Filled 2020-01-21: qty 2

## 2020-01-21 MED ORDER — CHLORHEXIDINE GLUCONATE 0.12% ORAL RINSE (MEDLINE KIT)
15.0000 mL | Freq: Two times a day (BID) | OROMUCOSAL | Status: DC
  Administered 2020-01-21 – 2020-01-26 (×10): 15 mL via OROMUCOSAL

## 2020-01-21 MED ORDER — ETOMIDATE 2 MG/ML IV SOLN: 20.0000 mg | Freq: Once | INTRAVENOUS | Status: AC

## 2020-01-21 NOTE — Progress Notes (Signed)
PHARMACY CONSULT NOTE - FOLLOW UP  Pharmacy Consult for Electrolyte Monitoring and Replacement   Recent Labs: Potassium (mmol/L)  Date Value  01/21/2020 3.8   Magnesium (mg/dL)  Date Value  01/21/2020 2.8 (H)   Calcium (mg/dL)  Date Value  01/21/2020 8.3 (L)   Albumin (g/dL)  Date Value  01/20/2020 4.2   Phosphorus (mg/dL)  Date Value  01/21/2020 6.0 (H)   Sodium (mmol/L)  Date Value  01/21/2020 136     Assessment: 59 year old female admitted to the ICU intubated and sedated. Patient with DKA on insulin drip, also on bicarbonate drip for acidosis. Pharmacy to manage electrolytes.  Goal of Therapy:  Electrolytes WNL  Plan:  --8/4 1626 K 3.8 improved from 3.2 with repletion. Acidosis is resolving with insulin + bicarb drip. Serum creatinine is trending up. Will order additional potassium 20 mEq per tube x 1 --BMP ordered every 4 hours while on insulin drip.  Benita Gutter  01/21/2020 5:37 PM

## 2020-01-21 NOTE — Progress Notes (Signed)
Pharmacy Antibiotic Note  Natalie [name] is a 59 y.o. female admitted on 01/20/2020 with sepsis.  Pharmacy has been consulted for Cefepime dosing.  Plan: Cefepime 2gm IV q12hrs (renally adjusted)  Height: 5\' 5"  (165.1 cm) Weight: 90.7 kg (200 lb) IBW/kg (Calculated) : 57  Temp (24hrs), Avg:96.9 F (36.1 C), Min:96.2 F (35.7 C), Max:97.6 F (36.4 C)  Recent Labs  Lab 01/20/20 2216 01/20/20 2217 01/21/20 0040 01/21/20 0045 01/21/20 0325  WBC 15.1*  --   --   --  13.6*  CREATININE 2.41*  --  2.25*  --   --   LATICACIDVEN  --  3.5*  --  2.0*  --     Estimated Creatinine Clearance: 29.6 mL/min (A) (by C-G formula based on SCr of 2.25 mg/dL (H)).    No Known Allergies  Antimicrobials this admission:   >>    >>   Dose adjustments this admission:   Microbiology results:  BCx:   UCx:    Sputum:    MRSA PCR:   Thank you for allowing pharmacy to be a part of this patients care.  Hart Robinsons A 01/21/2020 6:06 AM

## 2020-01-21 NOTE — ED Notes (Signed)
Called floor to give report, Per receiving RN she is reviewing the chart and will call back with any questions. Name and number left,   Natalie [name] [phone]

## 2020-01-21 NOTE — ED Notes (Signed)
Pt very mobile in bed requiring frequent redirection. Will require sitter once family leaves

## 2020-01-21 NOTE — H&P (Addendum)
Name: Natalie [name] MRN: [id number] DOB: 06/22/1960    ADMISSION DATE:  01/20/2020 CONSULTATION DATE: 01/21/2020  REFERRING MD : Dr. Corky Downs   CHIEF COMPLAINT: Hyperglycemia   BRIEF PATIENT DESCRIPTION:  59 yo female admitted with DKA requiring insulin gtt after running out of her insulin gtt   SIGNIFICANT EVENTS/STUDIES:  08/4: Pt admitted to ICU with DKA   HISTORY OF PRESENT ILLNESS:   This is a 59 yo female with a PMH of Type II Diabetes Mellitus who presented to [facility] ER on 08/3 with unresponsiveness and hyperglycemia.  Per ER notes pts son reported the pt recently moved from [place], has not obtained insurance here, and has been out of her insulin for days.  EMS reported pt hypoxic on RA with O2 sats in the 70's requiring NRB and CBG 570.  EMS also had concern of possible polysubstance abuse due to suspected track marks on pts arms.  Upon arrival to the ER pt remained encephalopathic with pinpoint pupils.  Therefore, she received 1 mg of iv narcan x1 dose with improvement in mentation. Pt also noted to have Kussmaul respirations, lab result ruled pt in for DKA.  Insulin gtt initiated and ER physician contacted PCCM team for ICU admission.   PAST MEDICAL HISTORY :   has no past medical history on file.  has no past surgical history on file. Prior to Admission medications   Medication Sig Start Date End Date Taking? Authorizing Provider  atorvastatin (LIPITOR) 40 MG tablet Take 40 mg by mouth at bedtime. 01/15/20  Yes [provider]  benztropine (COGENTIN) 1 MG tablet Take 1 tablet by mouth daily. 01/15/20  Yes [provider]  citalopram (CELEXA) 20 MG tablet Take 20 mg by mouth every morning. 01/15/20  Yes [provider]  JARDIANCE 25 MG TABS tablet Take 25 mg by mouth daily. 01/15/20  Yes [provider]  latanoprost (XALATAN) 0.005 % ophthalmic solution Place 1 drop into both eyes at bedtime. 01/15/20  Yes [provider]    LORazepam (ATIVAN) 1 MG tablet Take 1 mg by mouth 2 (two) times daily. 12/17/19  Yes [provider]  Multiple Vitamin (DAILY-VITE MULTIVITAMIN) TABS Take 1 tablet by mouth daily. 01/15/20  Yes [provider]  TRELEGY ELLIPTA 100-62.5-25 MCG/INH AEPB Inhale 1 puff into the lungs daily. 01/15/20  Yes [provider]  TRULICITY 1.5 FG/1.8EX SOPN Inject 1.5 mg into the skin once a week. 01/15/20  Yes [provider]  ziprasidone (GEODON) 40 MG capsule Take 1 capsule by mouth at bedtime. 01/19/20  Yes [provider]   No Known Allergies  FAMILY HISTORY:  family history is not on file. SOCIAL HISTORY:    REVIEW OF SYSTEMS:  Unable to assess pt confused and agitated  SUBJECTIVE:  Unable to assess pt confused and agitated   VITAL SIGNS: Temp:  [97.6 F (36.4 C)] 97.6 F (36.4 C) (08/03 2216) Pulse Rate:  [104-117] 109 (08/04 0027) Resp:  [24-31] 31 (08/03 2222) BP: (112-126)/(60-115) 112/60 (08/04 0027) SpO2:  [91 %-100 %] 100 % (08/04 0027) Weight:  [90.7 kg] 90.7 kg (08/03 2214)  PHYSICAL EXAMINATION: General: restless/agitated Neuro: not following commands, moves all extremities, bilateral pupils 2 mm sluggish HEENT: supple, no JVD  Cardiovascular: sinus tach, no R/G  Lungs: kussmaul respirations, clear throughout Abdomen: +BS x4, obese, soft, non distended  Musculoskeletal: normal bulk and tone, no edema  Skin: intact no rashes or lesions present   Recent Labs  Lab 01/20/20 2216  NA 123*  K 6.1*  CL 89*  CO2 <7*  BUN 27*  CREATININE 2.41*  GLUCOSE 800*   Recent Labs  Lab 01/20/20 2216  HGB 12.9  HCT 40.1  WBC 15.1*  PLT 231   DG Chest Portable 1 View  Result Date: 01/21/2020 CLINICAL DATA:  Altered mental status EXAM: PORTABLE CHEST 1 VIEW COMPARISON:  None. FINDINGS: Possible patchy airspace opacity in the left lower lung. Normal heart size. No pneumothorax. IMPRESSION: Possible patchy airspace opacity/mild pneumonia  in the left lower lung. Electronically Signed   By: Donavan Foil M.D.   On: 01/21/2020 00:11    ASSESSMENT / PLAN:  Diabetic ketoacidosis secondary to medication noncompliance Acute respiratory failure secondary to metabolic acidosis in the setting of DKA   Supplemental O2 for dyspnea and/or hypoxia  HIGH risk for mechanical intubation  Continue insulin gtt until anion gap closed and serum CO2 >20 IV fluids per DKA protocol  BMP q4hrs and CBG's per endo tool recommendations  Hemoglobin A1c pending  Keep NPO for now  Diabetes coordinator consulted appreciate input   Acute renal failure with hyperkalemia, hyponatremia, and metabolic acidosis secondary to hypovolemia in setting of DKA  Lactic acidosis  Trend BMP and lactic acid  Replace electrolytes as indicated  Monitor UOP Avoid nephrotoxic medications  Aggressive fluid resuscitation   Mild leukocytosis without signs of infection  Trend WBC and monitor fever curve Will check PCT  No indication for abx therapy   Acute encephalopathy likely secondary to metabolic derangements Severe agitation/delirium  Urine drug and alcohol level pending  Will start precedex gtt and obtain CT Head   Marda Stalker, [place] Pager [phone] (please enter 7 digits) PCCM Consult Pager [phone] (please enter 7 digits)

## 2020-01-21 NOTE — Procedures (Signed)
Intubation Procedure Note  Natalie [name]  [id number]  1960-10-31  Date:01/21/20  Time:2:56 AM   Provider Performing:Kenadee Gates Shauna Hugh    Procedure: Intubation (76720)  Indication(s) Respiratory Failure  Consent Unable to obtain consent due to emergent nature of procedure.   Anesthesia Etomidate, Fentanyl and Rocuronium   Time Out Verified patient identification, verified procedure, site/side was marked, verified correct patient position, special equipment/implants available, medications/allergies/relevant history reviewed, required imaging and test results available.   Sterile Technique Usual hand hygeine, masks, and gloves were used   Procedure Description Patient positioned in bed supine.  Sedation given as noted above.  Patient was intubated with endotracheal tube using Glidescope.  View was Grade 1 full glottis .  Number of attempts was 1.  Colorimetric CO2 detector was consistent with tracheal placement.   Complications/Tolerance None; patient tolerated the procedure well. Chest X-ray is ordered to verify placement.   EBL Minimal   Specimen(s) None  Pt with severe acute respiratory failure secondary to metabolic acidosis in the setting of DKA.  Marda Stalker, [place] Pager [phone] (please enter 7 digits) PCCM Consult Pager [phone] (please enter 7 digits) a

## 2020-01-21 NOTE — Progress Notes (Signed)
Pt taken to CT on the vent and returned to ICU 13 without incident. Pt remains on vent and is tol well at this time.

## 2020-01-21 NOTE — Progress Notes (Signed)
PHARMACY CONSULT NOTE - FOLLOW UP  Pharmacy Consult for Electrolyte Monitoring and Replacement   Recent Labs: Potassium (mmol/L)  Date Value  01/21/2020 3.6   Magnesium (mg/dL)  Date Value  01/21/2020 2.8 (H)   Calcium (mg/dL)  Date Value  01/21/2020 7.9 (L)   Albumin (g/dL)  Date Value  01/20/2020 4.2   Phosphorus (mg/dL)  Date Value  01/21/2020 6.0 (H)   Sodium (mmol/L)  Date Value  01/21/2020 136     Assessment: 59 year old female admitted to the ICU intubated and sedated. Patient with DKA on insulin drip, also on bicarbonate drip for acidosis. Pharmacy to manage electrolytes.  Goal of Therapy:  Electrolytes WNL  Plan:  --8/4 2005 K 3.6 from 3.8 despite repletion with enteral potassium 20 mEq x 1. Remains acidotic and continues on insulin + bicarb drip. Serum creatinine is trending up. Will order additional potassium 20 mEq per tube x 1 --BMP ordered every 4 hours while on insulin drip.  Benita Gutter  01/21/2020 9:22 PM

## 2020-01-21 NOTE — Progress Notes (Signed)
Updated pts son Ka-Lid Majchrzak via telephone regarding decline in pts condition requiring emergent mechanical intubation and all questions were answered.    Marda Stalker, [place] Pager [phone] (please enter 7 digits) PCCM Consult Pager [phone] (please enter 7 digits)

## 2020-01-21 NOTE — Progress Notes (Signed)
Pharmacy Antibiotic Note  Natalie [name] is a 59 y.o. female admitted on 01/20/2020 with sepsis.  Pharmacy has been consulted for Cefepime dosing.  Plan: Cefepime 2gm IV q24h, adjusted for renal function  Height: 5\' 5"  (165.1 cm) Weight: 82.9 kg (182 lb 12.2 oz) IBW/kg (Calculated) : 57  Temp (24hrs), Avg:98.3 F (36.8 C), Min:96.2 F (35.7 C), Max:99.6 F (37.6 C)  Recent Labs  Lab 01/20/20 2216 01/20/20 2217 01/21/20 0040 01/21/20 0045 01/21/20 0325 01/21/20 0740 01/21/20 1227  WBC 15.1*  --   --   --  13.6*  --   --   CREATININE 2.41*  --  2.25*  --   --  2.43* 2.66*  LATICACIDVEN  --  3.5*  --  2.0*  --   --   --     Estimated Creatinine Clearance: 23.9 mL/min (A) (by C-G formula based on SCr of 2.66 mg/dL (H)).    No Known Allergies  Antimicrobials this admission: Cefepime 8/4 >>   Microbiology results: 8/4 BCx: NGTD 8/4 UCx:  pending   Thank you for allowing pharmacy to be a part of this patient's care.  Tawnya Crook, PharmD 01/21/2020 3:42 PM

## 2020-01-21 NOTE — Progress Notes (Signed)
PHARMACY CONSULT NOTE - FOLLOW UP  Pharmacy Consult for Electrolyte Monitoring and Replacement   Recent Labs: Potassium (mmol/L)  Date Value  01/21/2020 3.2 (L)   Magnesium (mg/dL)  Date Value  01/21/2020 2.8 (H)   Calcium (mg/dL)  Date Value  01/21/2020 8.3 (L)   Albumin (g/dL)  Date Value  01/20/2020 4.2   Phosphorus (mg/dL)  Date Value  01/21/2020 6.0 (H)   Sodium (mmol/L)  Date Value  01/21/2020 136     Assessment: 59 year old female admitted to the ICU intubated and sedated. Patient with DKA on insulin drip, also on bicarbonate drip for acidosis. Pharmacy to manage electrolytes.  Goal of Therapy:  Electrolytes WNL  Plan:  8/4 1227 K 3.2. Potassium 40 mEq per tube x 2 doses ordered. BMP ordered every 4 hours while on insulin drip.  Tawnya Crook ,PharmD Clinical Pharmacist 01/21/2020 3:08 PM

## 2020-01-21 NOTE — Progress Notes (Signed)
Initial Nutrition Assessment  DOCUMENTATION CODES:   Obesity unspecified  INTERVENTION:  Initiate Vital AF 1.2 Cal at 60 mL/hr (1440 mL goal daily volume). Provides 1728 kcal, 108 grams of protein, 1166 mL H2O daily.   NUTRITION DIAGNOSIS:   Inadequate oral intake related to inability to eat as evidenced by NPO status.  GOAL:   Patient will meet greater than or equal to 90% of their needs  MONITOR:   Vent status, Labs, Weight trends, TF tolerance, I & O's  REASON FOR ASSESSMENT:   Ventilator, Consult Enteral/tube feeding initiation and management  ASSESSMENT:   59 year old female with PMHx of DM admitted with DKA, AKI, and acute respiratory failure secondary to metabolic acidosis requiring intubation.   Patient is currently intubated on ventilator support MV: 9.3 L/min Temp (24hrs), Avg:98.1 F (36.7 C), Min:96.2 F (35.7 C), Max:99.6 F (37.6 C)  Medications reviewed and include: Colace 100 mg BID, Protonix, Miralax, cefepime, fentanyl gtt, regular insulin gtt, norepinephrine gtt (off at time of RD assessment), sodium bicarbonate 150 mEq in sterile water at 125 mL/hr.  Labs reviewed: CBG 308-351, Chloride 97, CO2 14, BUN 37, Creatinine 2.43.  I/O: 1200 mL UOP yesterday  No wt history to trend in chart; RD will use [employer] to estimate needs and continue to monitor wt trends  Enteral Access: NGT; terminates in upper stomach per chest x-ray today  Discussed with RN and MD. Plan is to start tube feeds today.  NUTRITION - FOCUSED PHYSICAL EXAM:    Most Recent Value  Orbital Region No depletion  Upper Arm Region No depletion  Thoracic and Lumbar Region No depletion  Buccal Region Unable to assess  Temple Region No depletion  Clavicle Bone Region No depletion  Clavicle and Acromion Bone Region No depletion  Scapular Bone Region Unable to assess  Dorsal Hand No depletion  Patellar Region No depletion  Anterior Thigh Region No depletion  Posterior Calf Region  No depletion  Edema (RD Assessment) None  Hair Reviewed  Eyes Unable to assess  Mouth Unable to assess  Skin Reviewed  Nails Reviewed     Diet Order:   Diet Order            Diet NPO time specified  Diet effective now                EDUCATION NEEDS:   No education needs have been identified at this time  Skin:  Skin Assessment: Reviewed RN Assessment  Last BM:  Unknown/PTA  Height:   Ht Readings from Last 1 Encounters:  01/21/20 5\' 5"  (1.651 m)   Weight:   Wt Readings from Last 1 Encounters:  01/21/20 82.9 kg   Ideal Body Weight:  56.8 kg  BMI:  Body mass index is 30.41 kg/m.  Estimated Nutritional Needs:   Kcal:  1703  Protein:  100-115 grams  Fluid:  2 L/day  Jacklynn Barnacle, MS, RD, LDN Pager number available on Amion

## 2020-01-21 NOTE — Progress Notes (Signed)
Inpatient Diabetes Program Recommendations  AACE/ADA: New Consensus Statement on Inpatient Glycemic Control   Target Ranges:  Prepandial:   less than 140 mg/dL      Peak postprandial:   less than 180 mg/dL (1-2 hours)      Critically ill patients:  140 - 180 mg/dL   Results for Natalie [name], Natalie [name] (MRN [id number]) as of 01/21/2020 09:02  Ref. Range 01/21/2020 01:18 01/21/2020 03:20 01/21/2020 05:39 01/21/2020 06:43 01/21/2020 08:13  Glucose-Capillary Latest Ref Range: 70 - 99 mg/dL >600 (HH) 491 (H) 383 (H) 396 (H) 351 (H)  Results for Natalie [name], Natalie [name] (MRN [id number]) as of 01/21/2020 09:02  Ref. Range 01/20/2020 22:16  CO2 Latest Ref Range: 22 - 32 mmol/L <7 (L)  Glucose Latest Ref Range: 70 - 99 mg/dL 800 (HH)  Anion gap Latest Ref Range: 5 - 15  NOT CALCULATED  Results for Natalie [name], Natalie [name] (MRN [id number]) as of 01/21/2020 09:02  Ref. Range 01/20/2020 22:16  Beta-Hydroxybutyric Acid Latest Ref Range: 0.05 - 0.27 mmol/L >8.00 (H)   Review of Glycemic Control  Diabetes history: DM2 Outpatient Diabetes medications: Trulicity 1.5 mg Qweek, Jardiance 25 mg daily Current orders for Inpatient glycemic control: IV insulin per DKA  Inpatient Diabetes Program Recommendations:    IV insulin: IV insulin should be continued until acidosis completely resolved as evidenced by CO2 20 or greater, Anion Gap <10-12, and Beta-hydroxybutyric acid <0.5).  Labs: Please consider ordering beta-hydroxybutyric acid Q8H until acidosis completely resolved.  Insulin at transition from IV to SQ insulin: Once acidosis is completely resolved and MD is ready to transition from IV to SQ insulin, please consider ordering Lantus 17 units Q24H (based on 82.9 kg x 0.2 units), CBGs Q4H, Novolog 0-15 units Q4H.  NOTE: Per chart, patient admitted with DKA and per H&P family reported patient had recently moved to [facility] from [place] and had been out of insulin for days.  However, noted that [place] Medicaid listed as insurance in chart. No  insulin listed on home medication list so unclear what insulin patient was taking. Patient is currently intubated and unable to provide any information at this time.  Called patient's son Nitya Cauthon) and spoke with him over the phone regarding his mother and DM. He reports that patient moved to [facility] from [place] in April of this year and that she had recently ran out of DM medications. He states that patient does have [place] Medicaid and she recently seen local provider; he took her to [facility] for an appointment last week and they sent in a prescription for her for DM medication. He states that when he went to pick of the medication, he was told it was $5,000 so he was not able to get it filled.  He was planning to call [facility] back today to see about getting something else prescribed or see why the DM medications were so expensive. He reports that when he came home from work yesterday that his mother was less responsive and just mumbling when he was trying to find out what was going on with her so he called 911 and patient was brought to the hospital. [facility] and spoke with Sharee Pimple (nurse) and she confirms that patient had initial visit last week and she was provided with prescriptions to refill Jardiance 25 mg daily and Trulicity 1.5 mg Qweek. Will consult TOC to run benefit check to be sure Jardiance and Trulicity are covered.  Thanks, Barnie Alderman, RN, MSN, CDE Diabetes Coordinator Inpatient Diabetes Program [phone]  Team Pager from [place] to [place])

## 2020-01-22 LAB — BASIC METABOLIC PANEL
Anion gap: 12 (ref 5–15)
Anion gap: 15 (ref 5–15)
Anion gap: 15 (ref 5–15)
Anion gap: 15 (ref 5–15)
Anion gap: 17 — ABNORMAL HIGH (ref 5–15)
BUN: 39 mg/dL — ABNORMAL HIGH (ref 6–20)
BUN: 40 mg/dL — ABNORMAL HIGH (ref 6–20)
BUN: 41 mg/dL — ABNORMAL HIGH (ref 6–20)
BUN: 44 mg/dL — ABNORMAL HIGH (ref 6–20)
BUN: 45 mg/dL — ABNORMAL HIGH (ref 6–20)
CO2: 22 mmol/L (ref 22–32)
CO2: 25 mmol/L (ref 22–32)
CO2: 25 mmol/L (ref 22–32)
CO2: 26 mmol/L (ref 22–32)
CO2: 27 mmol/L (ref 22–32)
Calcium: 7.5 mg/dL — ABNORMAL LOW (ref 8.9–10.3)
Calcium: 7.7 mg/dL — ABNORMAL LOW (ref 8.9–10.3)
Calcium: 8 mg/dL — ABNORMAL LOW (ref 8.9–10.3)
Calcium: 8.2 mg/dL — ABNORMAL LOW (ref 8.9–10.3)
Calcium: 8.3 mg/dL — ABNORMAL LOW (ref 8.9–10.3)
Chloride: 100 mmol/L (ref 98–111)
Chloride: 93 mmol/L — ABNORMAL LOW (ref 98–111)
Chloride: 93 mmol/L — ABNORMAL LOW (ref 98–111)
Chloride: 97 mmol/L — ABNORMAL LOW (ref 98–111)
Chloride: 99 mmol/L (ref 98–111)
Creatinine, Ser: 2.89 mg/dL — ABNORMAL HIGH (ref 0.44–1.00)
Creatinine, Ser: 3.01 mg/dL — ABNORMAL HIGH (ref 0.44–1.00)
Creatinine, Ser: 3.21 mg/dL — ABNORMAL HIGH (ref 0.44–1.00)
Creatinine, Ser: 3.23 mg/dL — ABNORMAL HIGH (ref 0.44–1.00)
Creatinine, Ser: 3.35 mg/dL — ABNORMAL HIGH (ref 0.44–1.00)
GFR calc Af Amer: 16 mL/min — ABNORMAL LOW (ref >60)
GFR calc Af Amer: 17 mL/min — ABNORMAL LOW (ref >60)
GFR calc Af Amer: 17 mL/min — ABNORMAL LOW (ref >60)
GFR calc Af Amer: 19 mL/min — ABNORMAL LOW (ref >60)
GFR calc Af Amer: 20 mL/min — ABNORMAL LOW (ref >60)
GFR calc non Af Amer: 14 mL/min — ABNORMAL LOW (ref >60)
GFR calc non Af Amer: 15 mL/min — ABNORMAL LOW (ref >60)
GFR calc non Af Amer: 15 mL/min — ABNORMAL LOW (ref >60)
GFR calc non Af Amer: 16 mL/min — ABNORMAL LOW (ref >60)
GFR calc non Af Amer: 17 mL/min — ABNORMAL LOW (ref >60)
Glucose, Bld: 207 mg/dL — ABNORMAL HIGH (ref 70–99)
Glucose, Bld: 289 mg/dL — ABNORMAL HIGH (ref 70–99)
Glucose, Bld: 312 mg/dL — ABNORMAL HIGH (ref 70–99)
Glucose, Bld: 402 mg/dL — ABNORMAL HIGH (ref 70–99)
Glucose, Bld: 433 mg/dL — ABNORMAL HIGH (ref 70–99)
Potassium: 2.7 mmol/L — CL (ref 3.5–5.1)
Potassium: 3.2 mmol/L — ABNORMAL LOW (ref 3.5–5.1)
Potassium: 3.4 mmol/L — ABNORMAL LOW (ref 3.5–5.1)
Potassium: 3.5 mmol/L (ref 3.5–5.1)
Potassium: 3.9 mmol/L (ref 3.5–5.1)
Sodium: 135 mmol/L (ref 135–145)
Sodium: 135 mmol/L (ref 135–145)
Sodium: 135 mmol/L (ref 135–145)
Sodium: 137 mmol/L (ref 135–145)
Sodium: 139 mmol/L (ref 135–145)

## 2020-01-22 LAB — GLUCOSE, CAPILLARY
Glucose-Capillary: 187 mg/dL — ABNORMAL HIGH (ref 70–99)
Glucose-Capillary: 221 mg/dL — ABNORMAL HIGH (ref 70–99)
Glucose-Capillary: 227 mg/dL — ABNORMAL HIGH (ref 70–99)
Glucose-Capillary: 233 mg/dL — ABNORMAL HIGH (ref 70–99)
Glucose-Capillary: 234 mg/dL — ABNORMAL HIGH (ref 70–99)
Glucose-Capillary: 234 mg/dL — ABNORMAL HIGH (ref 70–99)
Glucose-Capillary: 237 mg/dL — ABNORMAL HIGH (ref 70–99)
Glucose-Capillary: 243 mg/dL — ABNORMAL HIGH (ref 70–99)
Glucose-Capillary: 243 mg/dL — ABNORMAL HIGH (ref 70–99)
Glucose-Capillary: 247 mg/dL — ABNORMAL HIGH (ref 70–99)
Glucose-Capillary: 280 mg/dL — ABNORMAL HIGH (ref 70–99)
Glucose-Capillary: 298 mg/dL — ABNORMAL HIGH (ref 70–99)
Glucose-Capillary: 313 mg/dL — ABNORMAL HIGH (ref 70–99)
Glucose-Capillary: 316 mg/dL — ABNORMAL HIGH (ref 70–99)
Glucose-Capillary: 327 mg/dL — ABNORMAL HIGH (ref 70–99)
Glucose-Capillary: 331 mg/dL — ABNORMAL HIGH (ref 70–99)
Glucose-Capillary: 336 mg/dL — ABNORMAL HIGH (ref 70–99)
Glucose-Capillary: 364 mg/dL — ABNORMAL HIGH (ref 70–99)
Glucose-Capillary: 383 mg/dL — ABNORMAL HIGH (ref 70–99)
Glucose-Capillary: 393 mg/dL — ABNORMAL HIGH (ref 70–99)
Glucose-Capillary: 400 mg/dL — ABNORMAL HIGH (ref 70–99)
Glucose-Capillary: 400 mg/dL — ABNORMAL HIGH (ref 70–99)
Glucose-Capillary: 413 mg/dL — ABNORMAL HIGH (ref 70–99)
Glucose-Capillary: 429 mg/dL — ABNORMAL HIGH (ref 70–99)

## 2020-01-22 LAB — BLOOD GAS, ARTERIAL
Acid-base deficit: 24 mmol/L — ABNORMAL HIGH (ref 0.0–2.0)
Bicarbonate: 6.7 mmol/L — ABNORMAL LOW (ref 20.0–28.0)
FIO2: 40
MECHVT: 450 mL
Mechanical Rate: 18
O2 Saturation: 97 %
PEEP: 5 cmH2O
Patient temperature: 37
pCO2 arterial: 29 mmHg — ABNORMAL LOW (ref 32.0–48.0)
pH, Arterial: 6.97 — CL (ref 7.350–7.450)
pO2, Arterial: 135 mmHg — ABNORMAL HIGH (ref 83.0–108.0)

## 2020-01-22 LAB — CBC
HCT: 26.5 % — ABNORMAL LOW (ref 36.0–46.0)
Hemoglobin: 9.8 g/dL — ABNORMAL LOW (ref 12.0–15.0)
MCH: 33.1 pg (ref 26.0–34.0)
MCHC: 37 g/dL — ABNORMAL HIGH (ref 30.0–36.0)
MCV: 89.5 fL (ref 80.0–100.0)
Platelets: 89 10*3/uL — ABNORMAL LOW (ref 150–400)
RBC: 2.96 MIL/uL — ABNORMAL LOW (ref 3.87–5.11)
RDW: 13.8 % (ref 11.5–15.5)
WBC: 5.1 10*3/uL (ref 4.0–10.5)
nRBC: 0 % (ref 0.0–0.2)

## 2020-01-22 LAB — URINE CULTURE: Culture: NO GROWTH

## 2020-01-22 LAB — PHOSPHORUS: Phosphorus: <1 mg/dL — CL (ref 2.5–4.6)

## 2020-01-22 LAB — BETA-HYDROXYBUTYRIC ACID
Beta-Hydroxybutyric Acid: 0.79 mmol/L — ABNORMAL HIGH (ref 0.05–0.27)
Beta-Hydroxybutyric Acid: 0.42 mmol/L — ABNORMAL HIGH (ref 0.05–0.27)

## 2020-01-22 LAB — MAGNESIUM: Magnesium: 2.2 mg/dL (ref 1.7–2.4)

## 2020-01-22 MED ORDER — DEXTROSE-NACL 5-0.45 % IV SOLN: INTRAVENOUS | Status: DC

## 2020-01-22 MED ORDER — MIDAZOLAM 50MG/50ML (1MG/ML) PREMIX INFUSION: 0.5000 mg/h | INTRAVENOUS | Status: DC

## 2020-01-22 MED ORDER — POTASSIUM PHOSPHATES 15 MMOLE/5ML IV SOLN
30.0000 mmol | Freq: Once | INTRAVENOUS | Status: AC
  Administered 2020-01-22: 30 mmol via INTRAVENOUS
  Filled 2020-01-22: qty 10

## 2020-01-22 MED ORDER — INSULIN REGULAR(HUMAN) IN NACL 100-0.9 UT/100ML-% IV SOLN
INTRAVENOUS | Status: DC
  Administered 2020-01-22: 14 [IU]/h via INTRAVENOUS
  Administered 2020-01-22: 15 [IU]/h via INTRAVENOUS
  Administered 2020-01-23 (×2): 30 [IU]/h via INTRAVENOUS
  Filled 2020-01-22 (×4): qty 100

## 2020-01-22 MED ORDER — POTASSIUM CHLORIDE 10 MEQ/100ML IV SOLN
10.0000 meq | INTRAVENOUS | Status: AC
  Administered 2020-01-22 (×2): 10 meq via INTRAVENOUS
  Filled 2020-01-22 (×2): qty 100

## 2020-01-22 MED ORDER — LACTATED RINGERS IV BOLUS
500.0000 mL | Freq: Once | INTRAVENOUS | Status: AC
  Administered 2020-01-22: 500 mL via INTRAVENOUS

## 2020-01-22 MED ORDER — CITALOPRAM HYDROBROMIDE 20 MG PO TABS
20.0000 mg | ORAL_TABLET | Freq: Every day | ORAL | Status: DC
  Administered 2020-01-22 – 2020-01-28 (×7): 20 mg
  Filled 2020-01-22 (×8): qty 1

## 2020-01-22 MED ORDER — DEXTROSE 50 % IV SOLN: 0.0000 mL | INTRAVENOUS | Status: DC | PRN

## 2020-01-22 MED ORDER — SODIUM CHLORIDE 0.9 % IV SOLN: INTRAVENOUS | Status: DC

## 2020-01-22 MED ORDER — POTASSIUM CHLORIDE 20 MEQ PO PACK
40.0000 meq | PACK | Freq: Once | ORAL | Status: AC
  Administered 2020-01-22: 40 meq
  Filled 2020-01-22: qty 2

## 2020-01-22 MED ORDER — LORAZEPAM 1 MG PO TABS
1.0000 mg | ORAL_TABLET | Freq: Two times a day (BID) | ORAL | Status: DC
  Administered 2020-01-22 – 2020-01-28 (×12): 1 mg
  Filled 2020-01-22 (×13): qty 1

## 2020-01-22 MED ORDER — POTASSIUM CHLORIDE 20 MEQ PO PACK
20.0000 meq | PACK | Freq: Once | ORAL | Status: AC
  Administered 2020-01-22: 20 meq
  Filled 2020-01-22: qty 1

## 2020-01-22 MED ORDER — POTASSIUM CHLORIDE 10 MEQ/100ML IV SOLN
10.0000 meq | INTRAVENOUS | Status: AC
  Administered 2020-01-22 – 2020-01-23 (×4): 10 meq via INTRAVENOUS
  Filled 2020-01-22 (×4): qty 100

## 2020-01-22 NOTE — Progress Notes (Signed)
CRITICAL CARE NOTE BRIEF PATIENT DESCRIPTION:  59 yo female admitted with DKA requiring insulin gtt after running out of her insulin gtt   SIGNIFICANT EVENTS/STUDIES:  08/4: Pt admitted to ICU with DKA  8/5 severe DKA  CC  follow up respiratory failure  SUBJECTIVE Patient remains critically ill Prognosis is guarded   CBC    Component Value Date/Time   WBC 5.1 01/22/2020 0415   RBC 2.96 (L) 01/22/2020 0415   HGB 9.8 (L) 01/22/2020 0415   HCT 26.5 (L) 01/22/2020 0415   PLT 89 (L) 01/22/2020 0415   MCV 89.5 01/22/2020 0415   MCH 33.1 01/22/2020 0415   MCHC 37.0 (H) 01/22/2020 0415   RDW 13.8 01/22/2020 0415   BMP Latest Ref Rng & Units 01/22/2020 01/22/2020 01/21/2020  Glucose 70 - 99 mg/dL 207(H) 312(H) 298(H)  BUN 6 - 20 mg/dL 44(H) 45(H) 43(H)  Creatinine 0.44 - 1.00 mg/dL 3.23(H) 3.35(H) 3.27(H)  Sodium 135 - 145 mmol/L 139 137 136  Potassium 3.5 - 5.1 mmol/L 3.4(L) 3.5 3.6  Chloride 98 - 111 mmol/L 99 100 101  CO2 22 - 32 mmol/L 25 22 17(L)  Calcium 8.9 - 10.3 mg/dL 8.2(L) 8.3(L) 7.9(L)     BP 121/67   Pulse 95   Temp 99.2 F (37.3 C) (Oral)   Resp 18   Ht 5' 5"  (1.651 m)   Wt 88.5 kg   SpO2 98%   BMI 32.47 kg/m    I/O last 3 completed shifts: In: 1876.4 [I.V.:1776.3; IV Piggyback:100] Out: 1925 [Urine:1925] No intake/output data recorded.  SpO2: 98 % O2 Flow Rate (L/min): 15 L/min FiO2 (%): 28 %  Estimated body mass index is 32.47 kg/m as calculated from the following:   Height as of this encounter: 5' 5"  (1.651 m).   Weight as of this encounter: 88.5 kg.  SIGNIFICANT EVENTS   REVIEW OF SYSTEMS  PATIENT IS UNABLE TO PROVIDE COMPLETE REVIEW OF SYSTEMS DUE TO SEVERE CRITICAL ILLNESS        PHYSICAL EXAMINATION:  GENERAL:critically ill appearing, +resp distress HEAD: Normocephalic, atraumatic.  EYES: Pupils equal, round, reactive to light.  No scleral icterus.  MOUTH: Moist mucosal membrane. NECK: Supple.  PULMONARY: +rhonchi,  +wheezing CARDIOVASCULAR: S1 and S2. Regular rate and rhythm. No murmurs, rubs, or gallops.  GASTROINTESTINAL: Soft, nontender, -distended.  Positive bowel sounds.   MUSCULOSKELETAL: No swelling, clubbing, or edema.  NEUROLOGIC: obtunded, GCS<8 SKIN:intact,warm,dry  MEDICATIONS: I have reviewed all medications and confirmed regimen as documented   CULTURE RESULTS   Recent Results (from the past 240 hour(s))  SARS Coronavirus 2 by RT PCR (hospital order, performed in [facility] hospital lab) Nasopharyngeal Nasopharyngeal Swab     Status: None   Collection Time: 01/21/20 12:45 AM   Specimen: Nasopharyngeal Swab  Result Value Ref Range Status   SARS Coronavirus 2 NEGATIVE NEGATIVE Final    Comment: (NOTE) SARS-CoV-2 target nucleic acids are NOT DETECTED.  The SARS-CoV-2 RNA is generally detectable in upper and lower respiratory specimens during the acute phase of infection. The lowest concentration of SARS-CoV-2 viral copies this assay can detect is 250 copies / mL. A negative result does not preclude SARS-CoV-2 infection and should not be used as the sole basis for treatment or other patient management decisions.  A negative result may occur with improper specimen collection / handling, submission of specimen other than nasopharyngeal swab, presence of viral mutation(s) within the areas targeted by this assay, and inadequate number of viral copies (<250 copies /  mL). A negative result must be combined with clinical observations, patient history, and epidemiological information.  Fact Sheet for Patients:   StrictlyIdeas.no  Fact Sheet for Healthcare Providers: BankingDealers.co.za  This test is not yet approved or  cleared by the [place] FDA and has been authorized for detection and/or diagnosis of SARS-CoV-2 by FDA under an Emergency Use Authorization (EUA).  This EUA will remain in effect (meaning this test can be used) for  the duration of the COVID-19 declaration under Section 564(b)(1) of the Act, 21 U.S.C. section 360bbb-3(b)(1), unless the authorization is terminated or revoked sooner.  Performed at [facility] [address] [place], [place] [address]   CULTURE, BLOOD (ROUTINE X 2) w Reflex to ID Panel     Status: None (Preliminary result)   Collection Time: 01/21/20  3:24 AM   Specimen: BLOOD RIGHT FOREARM  Result Value Ref Range Status   Specimen Description BLOOD RIGHT FOREARM  Final   Special Requests   Final    BOTTLES DRAWN AEROBIC AND ANAEROBIC Blood Culture adequate volume   Culture   Final    NO GROWTH 1 DAY Performed at [facility], [address] [place], [place] [address]    Report Status PENDING  Incomplete  CULTURE, BLOOD (ROUTINE X 2) w Reflex to ID Panel     Status: None (Preliminary result)   Collection Time: 01/21/20  3:24 AM   Specimen: BLOOD  Result Value Ref Range Status   Specimen Description BLOOD LEFT ANTECUBITAL  Final   Special Requests   Final    BOTTLES DRAWN AEROBIC AND ANAEROBIC Blood Culture adequate volume   Culture   Final    NO GROWTH 1 DAY Performed at [facility], [address] [place],  [address]    Report Status PENDING  Incomplete          IMAGING    No results found.   Nutrition Status: Nutrition Problem: Inadequate oral intake Etiology: inability to eat Signs/Symptoms: NPO status Interventions: Tube feeding     Indwelling Urinary Catheter continued, requirement due to   Reason to continue Indwelling Urinary Catheter strict Intake/Output monitoring for hemodynamic instability         Ventilator continued, requirement due to severe respiratory failure   Ventilator Sedation RASS 0 to -2      ASSESSMENT AND PLAN SYNOPSIS  SEVERE ACIDOSIS AND DKA WITH ACUTE RENAL FAILURE ATN   Severe ACUTE Hypoxic and Hypercapnic Respiratory Failure -continue Full MV support -continue  Bronchodilator Therapy -Wean Fio2 and PEEP as tolerated -will perform SAT/SBT when respiratory parameters are met -VAP/VENT bundle implementation  Morbid obesity, possible OSA.   Will certainly impact respiratory mechanics, ventilator weaning Suspect will need to consider additional PEEP  ACUTE KIDNEY INJURY/Renal Failure -continue Foley Catheter-assess need -Avoid nephrotoxic agents -Follow urine output, BMP -Ensure adequate renal perfusion, optimize oxygenation -Renal dose medications     NEUROLOGY Acute toxic metabolic encephalopathy, need for sedation Goal RASS -2 to -3  SHOCK-HYPOVOLUMIC -use vasopressors to keep MAP>65 if needed -aggressive IV fluid resuscitation  CARDIAC ICU monitoring  ID -continue IV abx as prescibed -follow up cultures  GI GI PROPHYLAXIS as indicated  NUTRITIONAL STATUS Nutrition Status: Nutrition Problem: Inadequate oral intake Etiology: inability to eat Signs/Symptoms: NPO status Interventions: Tube feeding   DIET-->TF's as tolerated Constipation protocol as indicated  ENDO - will use ICU hypoglycemic\Hyperglycemia protocol if indicated     ELECTROLYTES -follow labs as needed -replace as needed -pharmacy consultation  and following   DVT/GI PRX ordered and assessed TRANSFUSIONS AS NEEDED MONITOR FSBS I Assessed the need for Labs I Assessed the need for Foley I Assessed the need for Central Venous Line Family Discussion when available I Assessed the need for Mobilization I made an Assessment of medications to be adjusted accordingly Safety Risk assessment completed   CASE DISCUSSED IN MULTIDISCIPLINARY ROUNDS WITH ICU TEAM  Critical Care Time devoted to patient care services described in this note is 32 minutes.   Overall, patient is critically ill, prognosis is guarded.  Patient with Multiorgan failure and at high risk for cardiac arrest and death.    Corrin Parker, M.D.  [facility]  [facility]icine  Medical Director [place] Director [facility] Cardio-Pulmonary Department

## 2020-01-22 NOTE — Clinical Social Work Note (Addendum)
CSW acknowledges consult regarding benefits check for DM medications. Unable to do a benefits check for these medications. CSW attempted calling [facility] twice but phone hung up both times. Will try again later.  Dayton Scrape, [place]  12:23 pm Still unable to reach anyone at [facility]. Tried twice to speak to a pharmacist with same result as this morning. Tried speaking to just a Teaching laboratory technician member but no answer after several minutes on hold.  Dayton Scrape, [place]

## 2020-01-22 NOTE — Progress Notes (Signed)
PHARMACY CONSULT NOTE - FOLLOW UP  Pharmacy Consult for Electrolyte Monitoring and Replacement   Recent Labs: Potassium (mmol/L)  Date Value  01/22/2020 3.2 (L)   Magnesium (mg/dL)  Date Value  01/22/2020 2.2   Calcium (mg/dL)  Date Value  01/22/2020 7.5 (L)   Albumin (g/dL)  Date Value  01/20/2020 4.2   Phosphorus (mg/dL)  Date Value  01/22/2020 <1.0 (LL)   Sodium (mmol/L)  Date Value  01/22/2020 135   Assessment: 59 year old female admitted to the ICU intubated and sedated. Patient with DKA on insulin drip, now off bicarbonate drip with resolution of acidosis. Pharmacy to manage electrolytes.  Goal of Therapy:  Electrolytes WNL  Plan:  --8/5 at 2149 K 3.2, trending down to below goal.  Will order KCL 23meq per tube x 1 dose and recheck labs with next draw.  Pt remains on insulin drip. --Will continue to monitor q4h BMPs --All other electrolytes with morning labs  Hart Robinsons A 01/22/2020 10:50 PM

## 2020-01-22 NOTE — Progress Notes (Signed)
CRITICAL VALUE ALERT  Critical Value:  Phosphorous  Date & Time Notied:  01/22/20 01:05  Provider Notified: NP  Orders Received/Actions taken: pending

## 2020-01-22 NOTE — Plan of Care (Signed)
Pt unable follow commands, combative @ times.. CT scan head & EEG negative. Will continue to monitor pt closely.

## 2020-01-22 NOTE — Progress Notes (Signed)
CRITICAL VALUE ALERT  Critical Value:  2.7   Date & Time Notied:  01/22/2020 @1530    Provider Notified: DR Mortimer Fries  Orders Received/Actions taken: see  New orders of additional K+ runs

## 2020-01-22 NOTE — Progress Notes (Signed)
PHARMACY CONSULT NOTE - FOLLOW UP  Pharmacy Consult for Electrolyte Monitoring and Replacement   Recent Labs: Potassium (mmol/L)  Date Value  01/22/2020 3.5   Magnesium (mg/dL)  Date Value  01/21/2020 2.8 (H)   Calcium (mg/dL)  Date Value  01/22/2020 8.3 (L)   Albumin (g/dL)  Date Value  01/20/2020 4.2   Phosphorus (mg/dL)  Date Value  01/21/2020 6.0 (H)   Sodium (mmol/L)  Date Value  01/22/2020 137   Assessment: 59 year old female admitted to the ICU intubated and sedated. Patient with DKA on insulin drip, also on bicarbonate drip for acidosis. Pharmacy to manage electrolytes.  Goal of Therapy:  Electrolytes WNL  Plan:  --8/4 2005 K 3.6 from 3.8 despite repletion with enteral potassium 20 mEq x 1. Remains acidotic and continues on insulin + bicarb drip. Serum creatinine is trending up. Will order additional potassium 20 mEq per tube x 1 --BMP ordered every 4 hours while on insulin drip.  08/05 @ 0002 K+ = 3.5, WNL but goal for ICU is K+ >/= 4.  Will order an additional KCL 42mEq per tube and recheck K+ w/ next lab draw in am  Ena Dawley  01/22/2020 12:51 AM

## 2020-01-22 NOTE — Progress Notes (Signed)
PHARMACY CONSULT NOTE - FOLLOW UP  Pharmacy Consult for Electrolyte Monitoring and Replacement   Recent Labs: Potassium (mmol/L)  Date Value  01/22/2020 3.9   Magnesium (mg/dL)  Date Value  01/22/2020 2.2   Calcium (mg/dL)  Date Value  01/22/2020 7.7 (L)   Albumin (g/dL)  Date Value  01/20/2020 4.2   Phosphorus (mg/dL)  Date Value  01/22/2020 <1.0 (LL)   Sodium (mmol/L)  Date Value  01/22/2020 135   Assessment: 59 year old female admitted to the ICU intubated and sedated. Patient with DKA on insulin drip, now off bicarbonate drip with resolution of acidosis. Pharmacy to manage electrolytes.  Goal of Therapy:  Electrolytes WNL  Plan:  --8/5 at 1803 K 3.9. No electrolyte repletion indicated at this time. Patient remains on insulin infusion --Will continue to monitor q4h BMPs --All other electrolytes with morning labs  Benita Gutter 01/22/2020 8:03 PM

## 2020-01-22 NOTE — Plan of Care (Signed)
Pt is still on full vent support, weaned off propofol gtt, Levo gtt, pt endo tube was restarted for ICU DKA protocol per Diabetic education suggestion. Blood sugar >400 now Dr.kasa was notified and stated to check the next 2 schedule and NP for night shift will follow up. Pt cont on insulin gtt, fent gtt, NS  MIVF per DKA protocols.  Pt son was updated this morning at bedside. He has concern about his mommy not able to fill medications. MD was notified and CM consult placed.

## 2020-01-22 NOTE — Progress Notes (Addendum)
PHARMACY CONSULT NOTE - FOLLOW UP  Pharmacy Consult for Electrolyte Monitoring and Replacement   Recent Labs: Potassium (mmol/L)  Date Value  01/22/2020 3.4 (L)   Magnesium (mg/dL)  Date Value  01/22/2020 2.2   Calcium (mg/dL)  Date Value  01/22/2020 8.2 (L)   Albumin (g/dL)  Date Value  01/20/2020 4.2   Phosphorus (mg/dL)  Date Value  01/22/2020 <1.0 (LL)   Sodium (mmol/L)  Date Value  01/22/2020 139   Assessment: 59 year old female admitted to the ICU intubated and sedated. Patient with DKA on insulin drip, now off bicarbonate drip with resolution of acidosis. Pharmacy to manage electrolytes.  Goal of Therapy:  Electrolytes WNL  Plan:  Receiving potassium phos 30 mmol IV x 1 for low phos this morning. Awaiting results of 1400 BMP. Patient is currently receiving 2 runs of potassium (hung after BMP drawn). BMPs q4h while on insulin drip. All other electrolytes with morning labs.  UPDATE: K 2.7. I have ordered an additional 2 runs of potassium and 40 mEq per tube. Will continue to monitor q4h BMPs.  Tawnya Crook, PharmD 01/22/2020 3:23 PM

## 2020-01-22 NOTE — Progress Notes (Signed)
Inpatient Diabetes Program Recommendations  AACE/ADA: New Consensus Statement on Inpatient Glycemic Control   Target Ranges:  Prepandial:   less than 140 mg/dL      Peak postprandial:   less than 180 mg/dL (1-2 hours)      Critically ill patients:  140 - 180 mg/dL  Results for Natalie [name], Natalie [name] (MRN [id number]) as of 01/22/2020 07:54  Ref. Range 01/22/2020 00:01 01/22/2020 01:06 01/22/2020 02:06 01/22/2020 03:06 01/22/2020 04:05 01/22/2020 04:56 01/22/2020 06:08 01/22/2020 07:10  Glucose-Capillary Latest Ref Range: 70 - 99 mg/dL 313 (H) 316 (H) 243 (H) 243 (H) 227 (H) 187 (H) 247 (H) 234 (H)  Results for Natalie [name], Natalie [name] (MRN [id number]) as of 01/22/2020 07:54  Ref. Range 01/22/2020 04:15  CO2 Latest Ref Range: 22 - 32 mmol/L 25  Glucose Latest Ref Range: 70 - 99 mg/dL 207 (H)  BUN Latest Ref Range: 6 - 20 mg/dL 44 (H)  Creatinine Latest Ref Range: 0.44 - 1.00 mg/dL 3.23 (H)  Anion gap Latest Ref Range: 5 - 15  15   Results for Natalie [name], Natalie [name] (MRN [id number]) as of 01/22/2020 07:54  Ref. Range 01/21/2020 03:23  Hemoglobin A1C Latest Ref Range: 4.8 - 5.6 % 11.7 (H)   Review of Glycemic Control  Diabetes history: DM2 Outpatient Diabetes medications: Trulicity 1.5 mg Qweek, Jardiance 25 mg daily (out of DM medications for several days) Current orders for Inpatient glycemic control: IV insulin   Inpatient Diabetes Program Recommendations:   Order set: Please discontinue ED Hyperglycemia order set and use Hyperglycemic Crisis Admission order set and select DKA as mode of therapy. Once acidosis is cleared then RN should answer "yes" to acidosis question on EndoTool then Endotool will automatically change goal range to Hyperglycemia goal range=140-180 mg/dl.   HbgA1C: A1C 11.7% on 01/21/2020 indicating an average glucose of 289 mg/dl over the past 2-3 months.  NOTE: Per conversation with patient's son on 01/21/20, patient has [place] Medicaid insurance and she ran out of all DM medications. Patient was seen at  [facility] last week for initial appointment and they sent in a prescription for her for DM medication. He states that when he went to pick of the medication, he was told it was $5,000 so he was not able to get it filled. Consulted TOC on 01/21/20 to run benefit check to be sure Jardiance and Trulicity are covered medications.   Patient currently still has ED Hyperglycemia order set ordered and glucose goals for ENDOX1 is still at 200-250 mg/dl. Once acidosis is cleared, RN should answer "yes" to acidosis question on EndoTool and then glucose goals will change to 140-180 mg/dl. Current IV insulin drip rate is 17 units/hour and has been consistently 10-25 units/hour over the past 8 hours.  Will continue to follow along and see how much insulin patient requires once glucose goals change to 140-180 mg/dl.  Thanks, Barnie Alderman, RN, MSN, CDE Diabetes Coordinator Inpatient Diabetes Program [phone] (Team Pager from 8am to 5pm)

## 2020-01-23 ENCOUNTER — Inpatient Hospital Stay: Payer: Medicaid Other

## 2020-01-23 LAB — BASIC METABOLIC PANEL
Anion gap: 10 (ref 5–15)
Anion gap: 12 (ref 5–15)
Anion gap: 11 (ref 5–15)
Anion gap: 13 (ref 5–15)
BUN: 37 mg/dL — ABNORMAL HIGH (ref 6–20)
BUN: 36 mg/dL — ABNORMAL HIGH (ref 6–20)
BUN: 36 mg/dL — ABNORMAL HIGH (ref 6–20)
BUN: 34 mg/dL — ABNORMAL HIGH (ref 6–20)
CO2: 26 mmol/L (ref 22–32)
CO2: 23 mmol/L (ref 22–32)
CO2: 26 mmol/L (ref 22–32)
CO2: 27 mmol/L (ref 22–32)
Calcium: 7.6 mg/dL — ABNORMAL LOW (ref 8.9–10.3)
Calcium: 7.9 mg/dL — ABNORMAL LOW (ref 8.9–10.3)
Calcium: 8 mg/dL — ABNORMAL LOW (ref 8.9–10.3)
Calcium: 8.2 mg/dL — ABNORMAL LOW (ref 8.9–10.3)
Chloride: 102 mmol/L (ref 98–111)
Chloride: 104 mmol/L (ref 98–111)
Chloride: 104 mmol/L (ref 98–111)
Chloride: 103 mmol/L (ref 98–111)
Creatinine, Ser: 2.83 mg/dL — ABNORMAL HIGH (ref 0.44–1.00)
Creatinine, Ser: 2.82 mg/dL — ABNORMAL HIGH (ref 0.44–1.00)
Creatinine, Ser: 2.64 mg/dL — ABNORMAL HIGH (ref 0.44–1.00)
Creatinine, Ser: 2.6 mg/dL — ABNORMAL HIGH (ref 0.44–1.00)
GFR calc Af Amer: 20 mL/min — ABNORMAL LOW (ref >60)
GFR calc Af Amer: 20 mL/min — ABNORMAL LOW (ref >60)
GFR calc Af Amer: 22 mL/min — ABNORMAL LOW (ref >60)
GFR calc Af Amer: 22 mL/min — ABNORMAL LOW (ref >60)
GFR calc non Af Amer: 17 mL/min — ABNORMAL LOW (ref >60)
GFR calc non Af Amer: 17 mL/min — ABNORMAL LOW (ref >60)
GFR calc non Af Amer: 19 mL/min — ABNORMAL LOW (ref >60)
GFR calc non Af Amer: 19 mL/min — ABNORMAL LOW (ref >60)
Glucose, Bld: 330 mg/dL — ABNORMAL HIGH (ref 70–99)
Glucose, Bld: 276 mg/dL — ABNORMAL HIGH (ref 70–99)
Glucose, Bld: 402 mg/dL — ABNORMAL HIGH (ref 70–99)
Glucose, Bld: 264 mg/dL — ABNORMAL HIGH (ref 70–99)
Potassium: 4.2 mmol/L (ref 3.5–5.1)
Potassium: 3.8 mmol/L (ref 3.5–5.1)
Potassium: 4 mmol/L (ref 3.5–5.1)
Potassium: 3.6 mmol/L (ref 3.5–5.1)
Sodium: 138 mmol/L (ref 135–145)
Sodium: 139 mmol/L (ref 135–145)
Sodium: 141 mmol/L (ref 135–145)
Sodium: 143 mmol/L (ref 135–145)

## 2020-01-23 LAB — GLUCOSE, CAPILLARY
Glucose-Capillary: 205 mg/dL — ABNORMAL HIGH (ref 70–99)
Glucose-Capillary: 209 mg/dL — ABNORMAL HIGH (ref 70–99)
Glucose-Capillary: 219 mg/dL — ABNORMAL HIGH (ref 70–99)
Glucose-Capillary: 221 mg/dL — ABNORMAL HIGH (ref 70–99)
Glucose-Capillary: 248 mg/dL — ABNORMAL HIGH (ref 70–99)
Glucose-Capillary: 271 mg/dL — ABNORMAL HIGH (ref 70–99)
Glucose-Capillary: 272 mg/dL — ABNORMAL HIGH (ref 70–99)
Glucose-Capillary: 277 mg/dL — ABNORMAL HIGH (ref 70–99)
Glucose-Capillary: 282 mg/dL — ABNORMAL HIGH (ref 70–99)
Glucose-Capillary: 292 mg/dL — ABNORMAL HIGH (ref 70–99)
Glucose-Capillary: 309 mg/dL — ABNORMAL HIGH (ref 70–99)
Glucose-Capillary: 319 mg/dL — ABNORMAL HIGH (ref 70–99)
Glucose-Capillary: 322 mg/dL — ABNORMAL HIGH (ref 70–99)
Glucose-Capillary: 322 mg/dL — ABNORMAL HIGH (ref 70–99)
Glucose-Capillary: 325 mg/dL — ABNORMAL HIGH (ref 70–99)
Glucose-Capillary: 332 mg/dL — ABNORMAL HIGH (ref 70–99)
Glucose-Capillary: 333 mg/dL — ABNORMAL HIGH (ref 70–99)
Glucose-Capillary: 344 mg/dL — ABNORMAL HIGH (ref 70–99)
Glucose-Capillary: 366 mg/dL — ABNORMAL HIGH (ref 70–99)
Glucose-Capillary: 368 mg/dL — ABNORMAL HIGH (ref 70–99)
Glucose-Capillary: 382 mg/dL — ABNORMAL HIGH (ref 70–99)
Glucose-Capillary: 387 mg/dL — ABNORMAL HIGH (ref 70–99)

## 2020-01-23 LAB — CBC
HCT: 27 % — ABNORMAL LOW (ref 36.0–46.0)
Hemoglobin: 9.8 g/dL — ABNORMAL LOW (ref 12.0–15.0)
MCH: 32.7 pg (ref 26.0–34.0)
MCHC: 36.3 g/dL — ABNORMAL HIGH (ref 30.0–36.0)
MCV: 90 fL (ref 80.0–100.0)
Platelets: 68 10*3/uL — ABNORMAL LOW (ref 150–400)
RBC: 3 MIL/uL — ABNORMAL LOW (ref 3.87–5.11)
RDW: 14.4 % (ref 11.5–15.5)
WBC: 4.5 10*3/uL (ref 4.0–10.5)
nRBC: 0 % (ref 0.0–0.2)

## 2020-01-23 LAB — PROCALCITONIN: Procalcitonin: 3.76 ng/mL

## 2020-01-23 LAB — BETA-HYDROXYBUTYRIC ACID: Beta-Hydroxybutyric Acid: 0.17 mmol/L (ref 0.05–0.27)

## 2020-01-23 LAB — MAGNESIUM: Magnesium: 1.8 mg/dL (ref 1.7–2.4)

## 2020-01-23 LAB — PHOSPHORUS: Phosphorus: <1 mg/dL — CL (ref 2.5–4.6)

## 2020-01-23 IMAGING — DX DG CHEST 1V PORT
1 series · 1 of 1 positions shown · non-contrast
Comparison: [DATE]

CLINICAL DATA: Fever and chills

EXAM:
PORTABLE CHEST 1 VIEW

[chest ap]
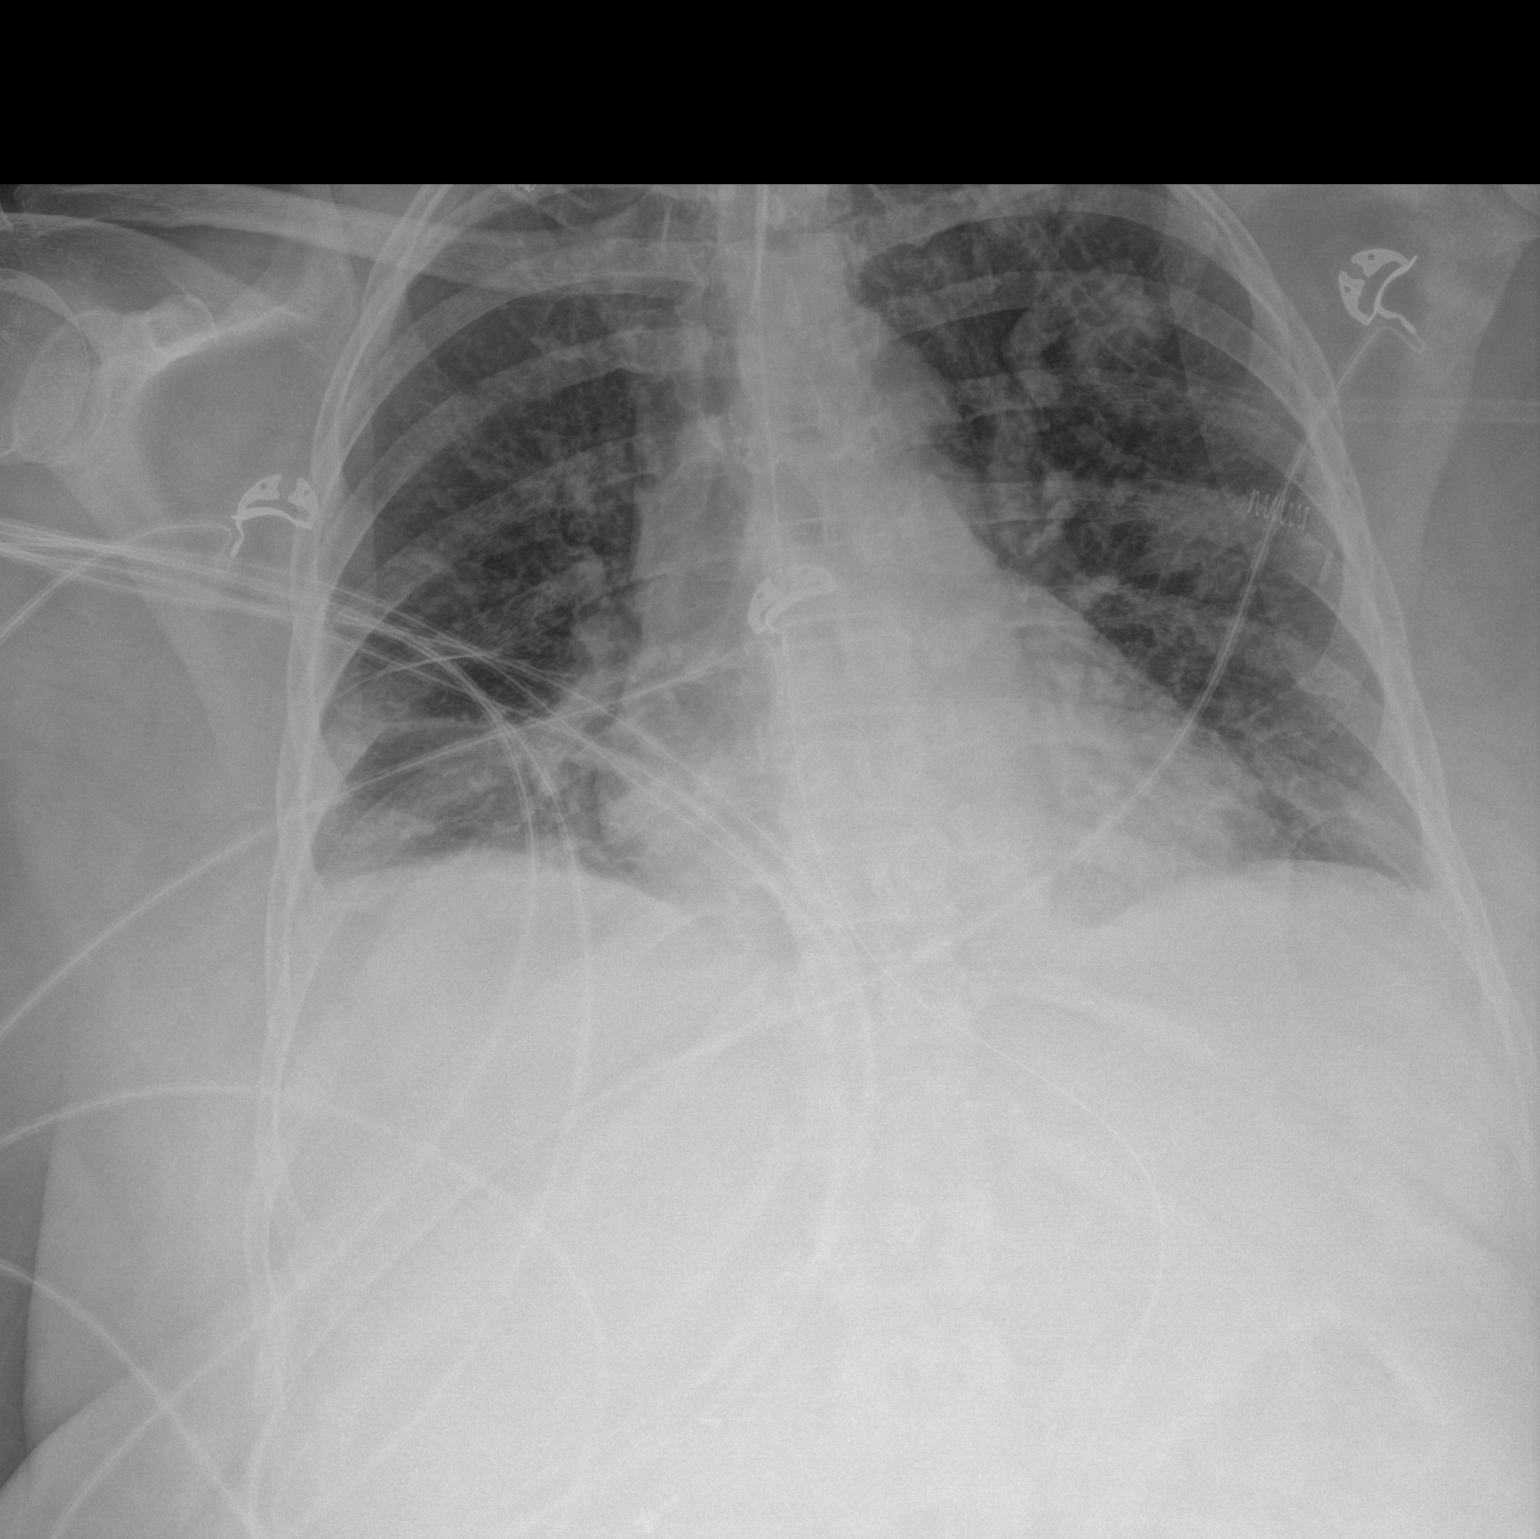

[1 of 1 positions shown; findings below may reference images not displayed]

FINDINGS: Endotracheal tube tip is about 3.4 cm superior to the carina.
Esophageal tube tip is below the diaphragm but incompletely
visualized. Small bilateral effusions. Mild cardiomegaly with
central vascular congestion. Hazy atelectasis at the lung bases.
IMPRESSION: Small bilateral pleural effusions with hazy atelectasis at the lung
bases. Mild cardiomegaly with central vascular congestion.

## 2020-01-23 IMAGING — DX DG ABDOMEN 1V
3 series · 3 of 3 positions shown · non-contrast
Comparison: None.

CLINICAL DATA: Abdominal distension.

EXAM:
ABDOMEN - 1 VIEW

[abdomen supine (1 of 3)]
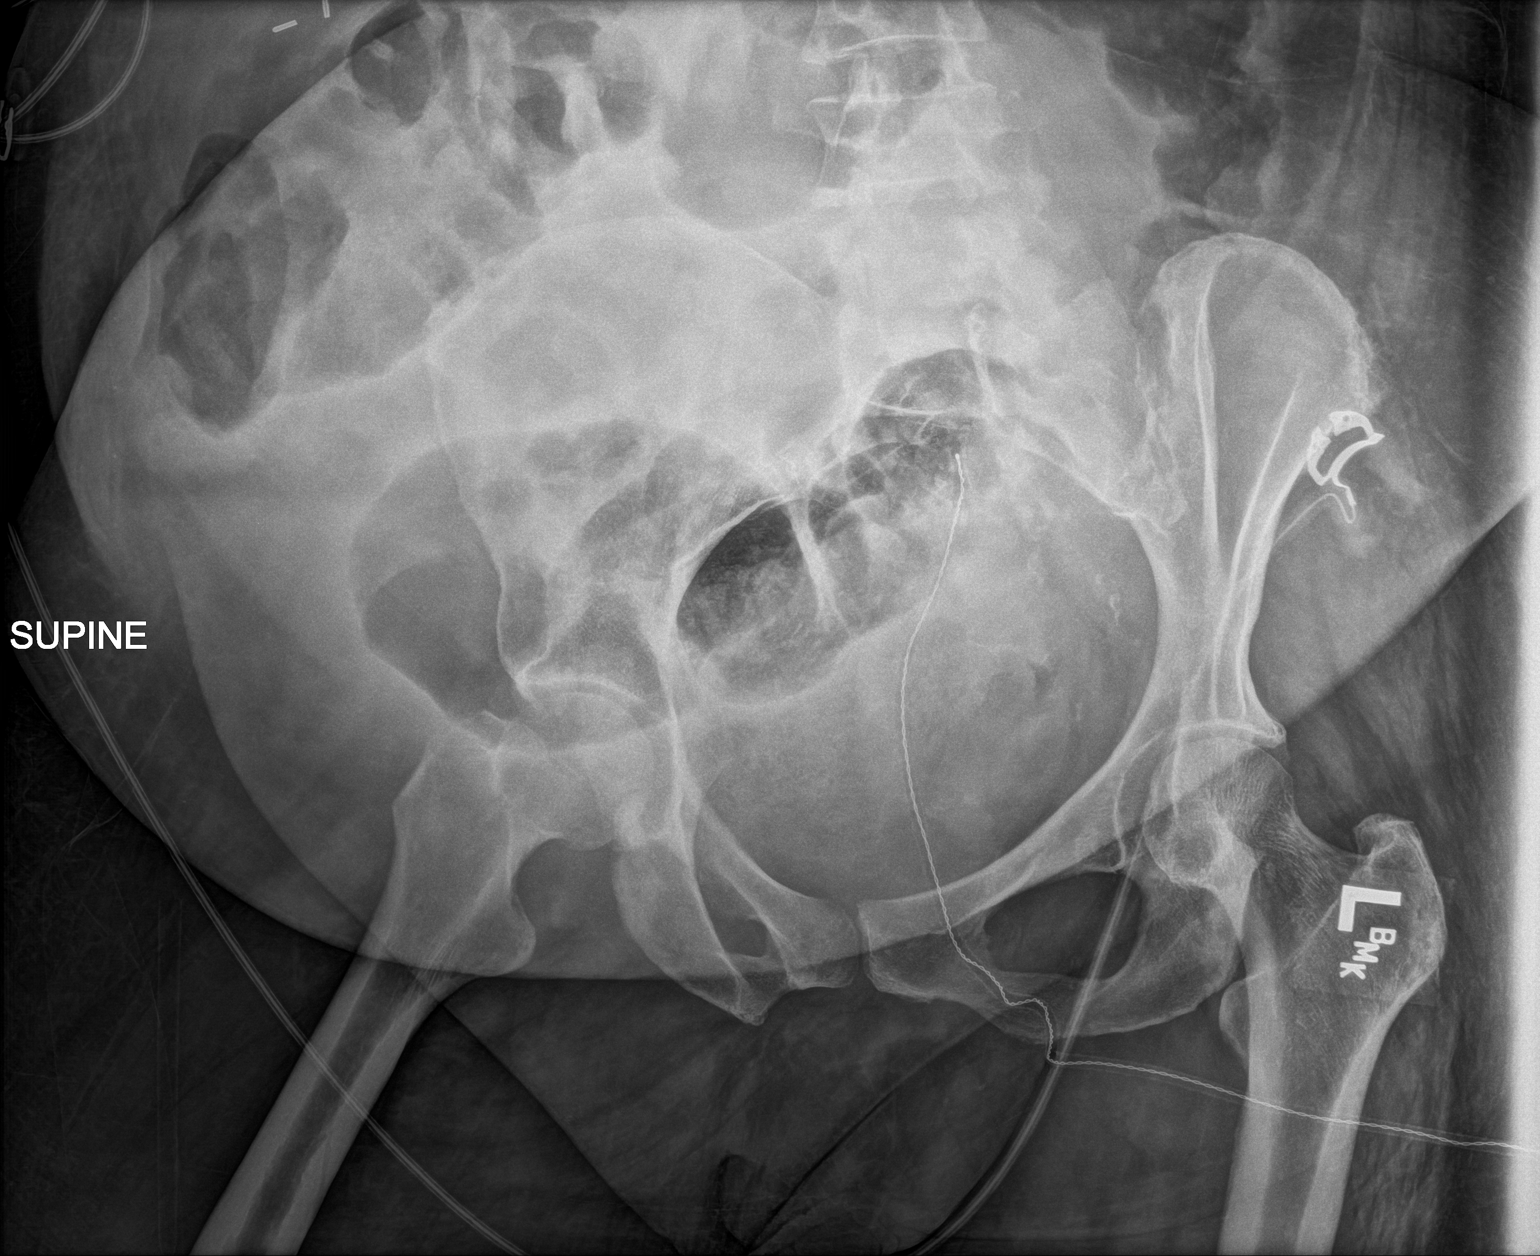

[abdomen supine (2 of 3)]
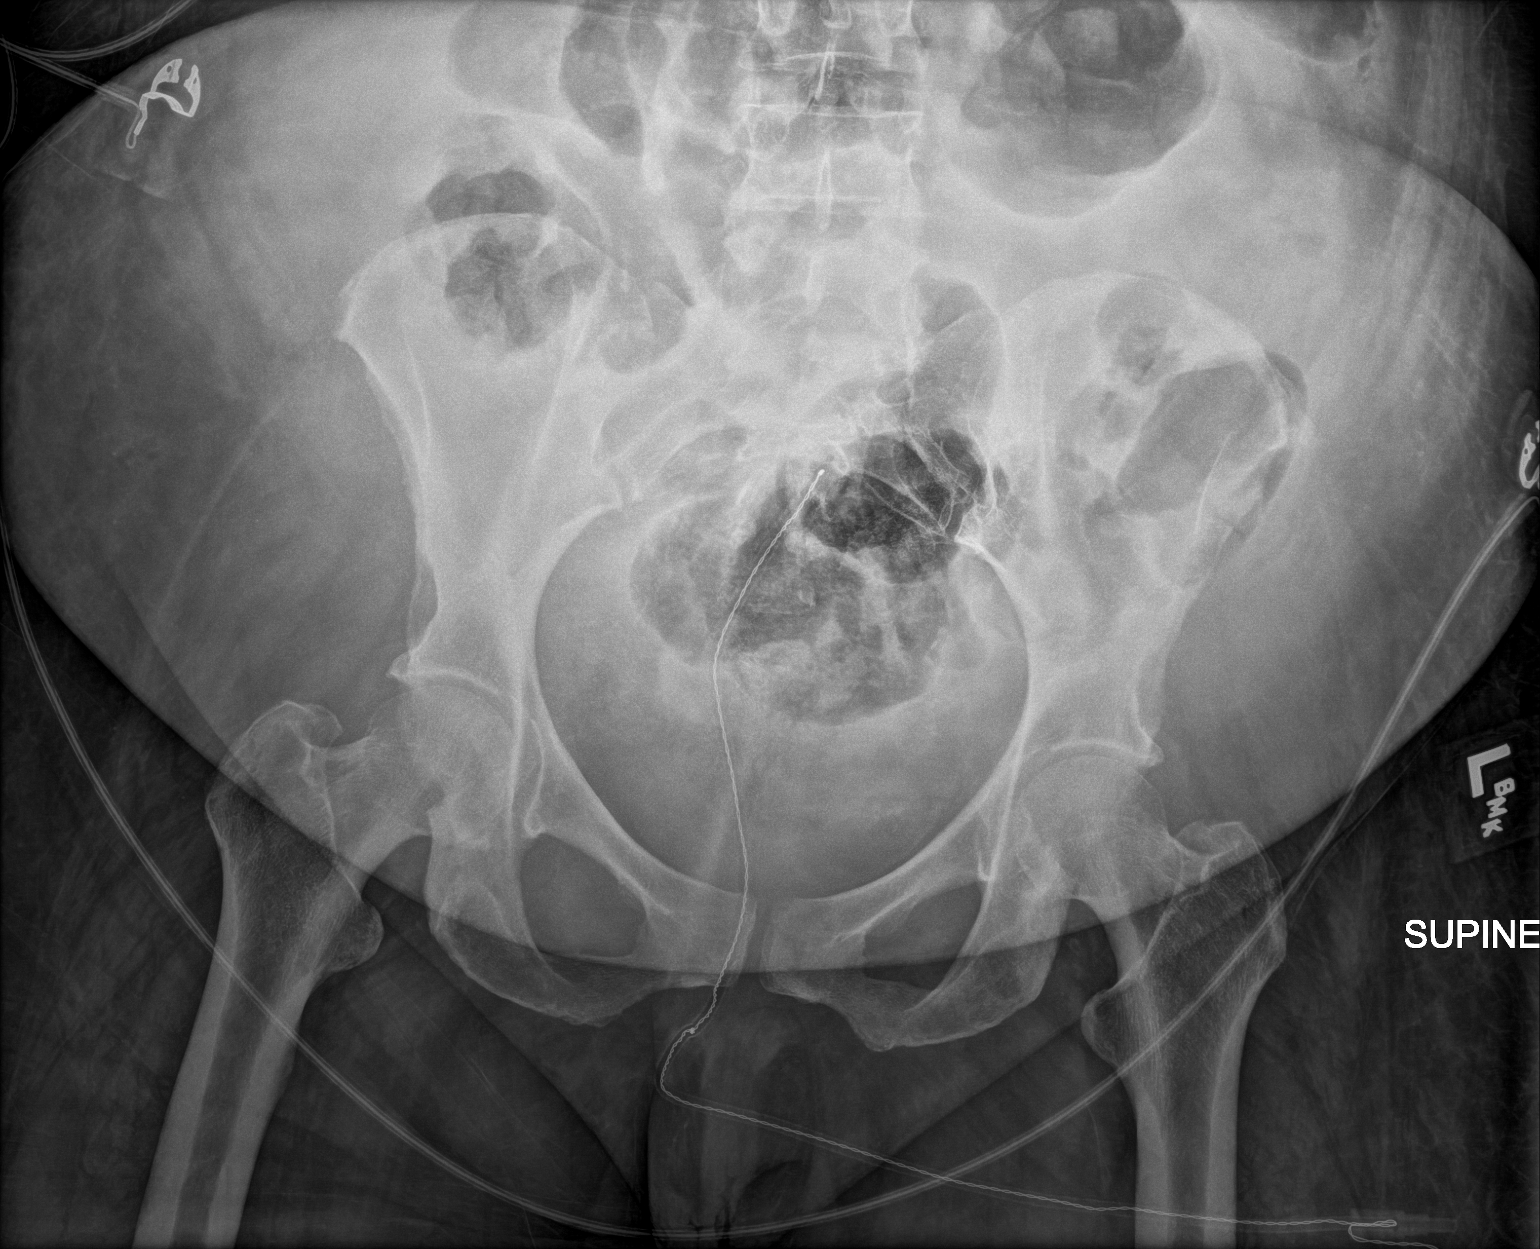

[abdomen supine (3 of 3)]
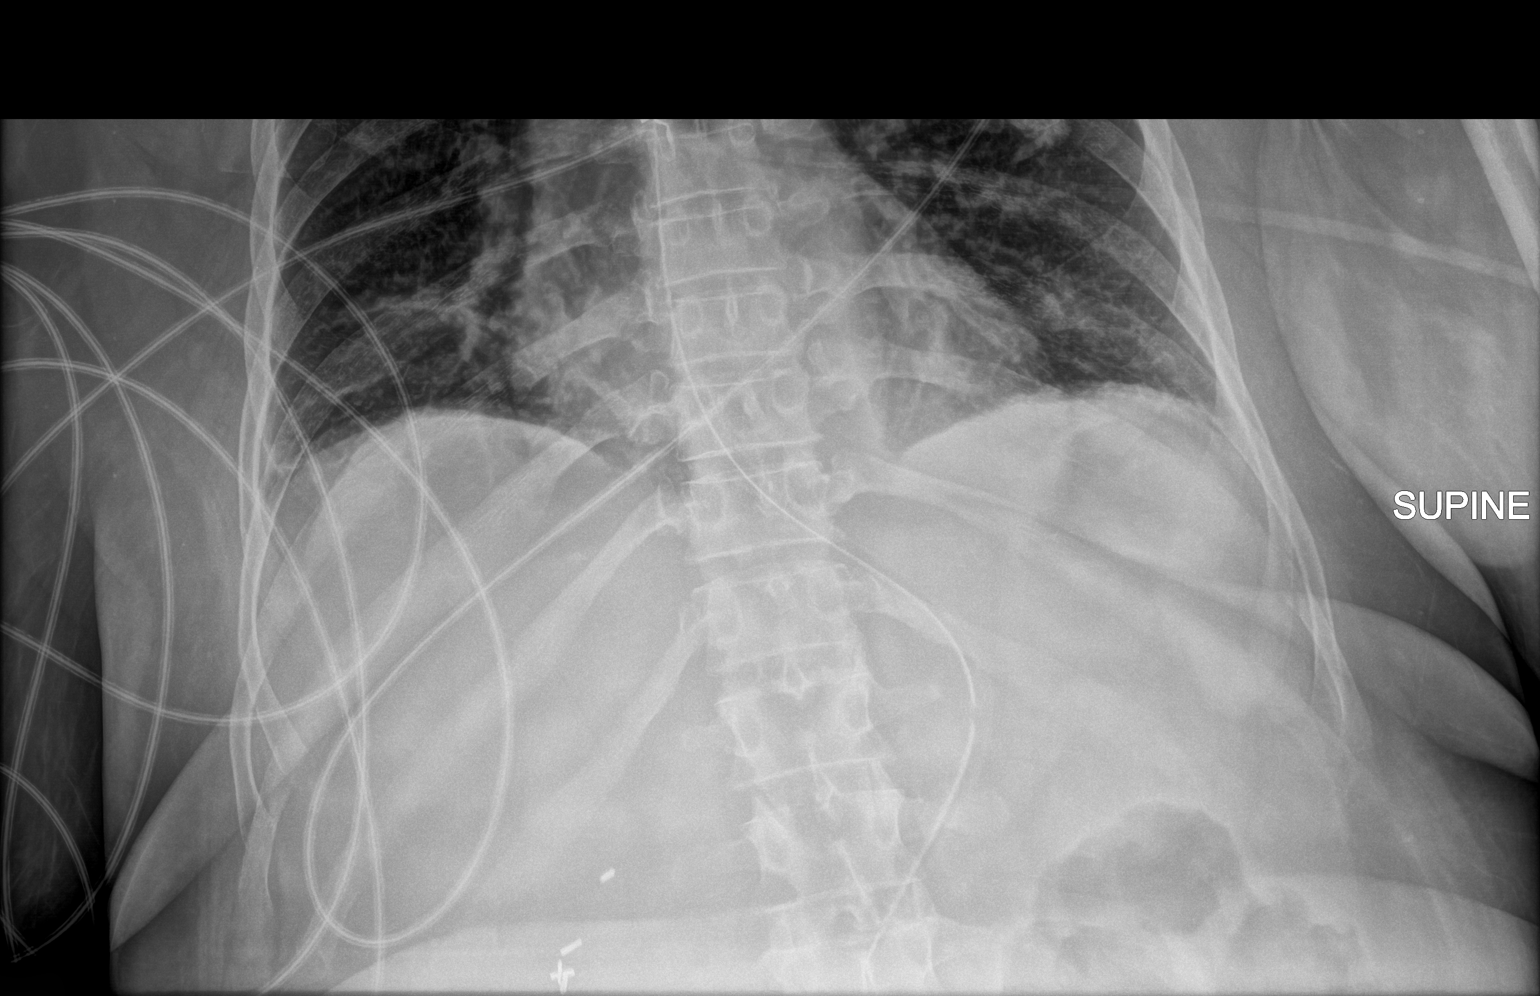

[3 of 3 positions shown; findings below may reference images not displayed]

FINDINGS: A nasogastric tube is seen with its distal tip overlying the body of
the stomach. The bowel gas pattern is normal. Radiopaque surgical
clips are seen overlying the right upper quadrant. No radio-opaque
calculi or other significant radiographic abnormality are seen.
IMPRESSION: No evidence of bowel obstruction or ileus.

## 2020-01-23 MED ORDER — DEXTROSE 50 % IV SOLN: 0.0000 mL | INTRAVENOUS | Status: DC | PRN

## 2020-01-23 MED ORDER — SODIUM CHLORIDE 0.9 % IV SOLN: INTRAVENOUS | Status: DC

## 2020-01-23 MED ORDER — VANCOMYCIN HCL IN DEXTROSE 1-5 GM/200ML-% IV SOLN
1000.0000 mg | INTRAVENOUS | Status: DC
  Administered 2020-01-25: 1000 mg via INTRAVENOUS
  Filled 2020-01-23: qty 200

## 2020-01-23 MED ORDER — DEXTROSE-NACL 5-0.45 % IV SOLN: INTRAVENOUS | Status: DC

## 2020-01-23 MED ORDER — INSULIN REGULAR(HUMAN) IN NACL 100-0.9 UT/100ML-% IV SOLN
INTRAVENOUS | Status: DC
  Administered 2020-01-23: 5 [IU]/h via INTRAVENOUS
  Administered 2020-01-23: 15 [IU]/h via INTRAVENOUS
  Administered 2020-01-24: 8.5 [IU]/h via INTRAVENOUS
  Filled 2020-01-23 (×2): qty 100

## 2020-01-23 MED ORDER — PIPERACILLIN-TAZOBACTAM 3.375 G IVPB
3.3750 g | Freq: Three times a day (TID) | INTRAVENOUS | Status: DC
  Administered 2020-01-23 – 2020-01-27 (×11): 3.375 g via INTRAVENOUS
  Filled 2020-01-23 (×13): qty 50

## 2020-01-23 MED ORDER — POTASSIUM PHOSPHATES 15 MMOLE/5ML IV SOLN
30.0000 mmol | Freq: Once | INTRAVENOUS | Status: AC
  Administered 2020-01-23: 30 mmol via INTRAVENOUS
  Filled 2020-01-23: qty 10

## 2020-01-23 MED ORDER — ACETAMINOPHEN 325 MG PO TABS
650.0000 mg | ORAL_TABLET | ORAL | Status: DC | PRN
  Administered 2020-01-23 – 2020-01-24 (×2): 650 mg via ORAL
  Filled 2020-01-23 (×3): qty 2

## 2020-01-23 MED ORDER — VANCOMYCIN HCL 1500 MG/300ML IV SOLN
1500.0000 mg | Freq: Once | INTRAVENOUS | Status: AC
  Administered 2020-01-23: 1500 mg via INTRAVENOUS
  Filled 2020-01-23: qty 300

## 2020-01-23 NOTE — Consult Note (Signed)
Pharmacy Antibiotic Note  Natalie [name] is a 59 y.o. female admitted on 01/20/2020 with DKA now with pneumonia.  Pharmacy has been consulted for vancomycin and Zosyn dosing. Her renal function is impaired but her baseline level is unclear.  Plan: 1) begin Zosyn 3.375g IV q8h (4 hour infusion)  2) vancomycin 1500 mg IV x 1 loading dose, then 1000 mg Q 48 hrs  Ke: 0.021 h-1, T1/2: 32.5 h  Css (calculated): 33.6/12.3 mcg/mL  SCr in am to assess renal function   Height: 5\' 5"  (165.1 cm) Weight: 93.3 kg (205 lb 11 oz) IBW/kg (Calculated) : 57  Temp (24hrs), Avg:100.9 F (38.3 C), Min:99.7 F (37.6 C), Max:102.9 F (39.4 C)  Recent Labs  Lab 01/20/20 2216 01/20/20 2217 01/21/20 0040 01/21/20 0045 01/21/20 0325 01/21/20 0740 01/22/20 0415 01/22/20 1404 01/22/20 1803 01/22/20 2149 01/23/20 0323 01/23/20 0717 01/23/20 1547  WBC 15.1*  --   --   --  13.6*  --  5.1  --   --   --  4.5  --   --   CREATININE 2.41*  --    < >  --   --    < > 3.23*   < > 3.01* 2.89* 2.83* 2.82* 2.64*  LATICACIDVEN  --  3.5*  --  2.0*  --   --   --   --   --   --   --   --   --    < > = values in this interval not displayed.    Estimated Creatinine Clearance: 25.6 mL/min (A) (by C-G formula based on SCr of 2.64 mg/dL (H)).    No Known Allergies  Antimicrobials this admission: Cefepime 8/3 >> 8/4 vancomycin 8/6 >>  Zosyn 8/6 >>   Microbiology results: 8/4 BCx: NG x 2 days 8/4 UCx: NG final  8/6 Sputum: pending  8/4 SARS CoV-2: negative 8/6 MRSA PCR: pending  Thank you for allowing pharmacy to be a part of this patient's care.  Dallie Piles 01/23/2020 6:12 PM

## 2020-01-23 NOTE — Progress Notes (Signed)
PHARMACY CONSULT NOTE - FOLLOW UP  Pharmacy Consult for Electrolyte Monitoring and Replacement   Recent Labs: Potassium (mmol/L)  Date Value  01/23/2020 4.2   Magnesium (mg/dL)  Date Value  01/22/2020 2.2   Calcium (mg/dL)  Date Value  01/23/2020 7.6 (L)   Albumin (g/dL)  Date Value  01/20/2020 4.2   Phosphorus (mg/dL)  Date Value  01/22/2020 <1.0 (LL)   Sodium (mmol/L)  Date Value  01/23/2020 138   Assessment: 59 year old female admitted to the ICU intubated and sedated. Patient with DKA on insulin drip, now off bicarbonate drip with resolution of acidosis. Pharmacy to manage electrolytes.  Goal of Therapy:  Electrolytes WNL  Plan:  --8/6 at 0323 K 4.2, at goal.  No additional supplementation needed at this time.  Pt remains on insulin drip. --Will continue to monitor q4h BMPs --All other electrolytes with morning labs  Hart Robinsons A 01/23/2020 4:22 AM

## 2020-01-23 NOTE — Progress Notes (Addendum)
Endo tool stopped one hour and will restated pt on ICU Glucemic control Phase 2 insulin Hyperglycemia mode at 1103.

## 2020-01-23 NOTE — Progress Notes (Signed)
Tube feed on hold per MD for now.

## 2020-01-23 NOTE — Progress Notes (Signed)
Pt FIO2 decreased to 30%, tolerating it well.

## 2020-01-23 NOTE — Progress Notes (Signed)
Pt placed on PS FiO2 30% peep of 5 and pt  toerating it well.

## 2020-01-23 NOTE — Clinical Social Work Note (Signed)
Tried Geographical information systems officer. Was on hold for 11 minutes, unable to speak with anyone. Will try again later.  Dayton Scrape, [place]

## 2020-01-23 NOTE — Progress Notes (Addendum)
PHARMACY CONSULT NOTE - FOLLOW UP  Pharmacy Consult for Electrolyte Monitoring and Replacement   Recent Labs: Potassium (mmol/L)  Date Value  01/23/2020 3.8   Magnesium (mg/dL)  Date Value  01/23/2020 1.8   Calcium (mg/dL)  Date Value  01/23/2020 7.9 (L)   Albumin (g/dL)  Date Value  01/20/2020 4.2   Phosphorus (mg/dL)  Date Value  01/23/2020 <1.0 (LL)   Sodium (mmol/L)  Date Value  01/23/2020 139   Assessment: 59 year old female admitted to the ICU intubated and sedated. Patient with DKA on insulin drip, now off bicarbonate drip with resolution of acidosis. Pharmacy to manage electrolytes.  Goal of Therapy:  Electrolytes WNL  Plan:  Phos remains low. Will give potassium phos 30 mmol again today. Repeat BMP pending.  Will continue to monitor q4h BMPs. All other electrolytes with morning labs.  ADDENDUM 8/6 1600: BMP ordered as add on at 1200 was discontinued as duplicate. Lab for 1600 has been drawn. Follow up potassium while patient remains on insulin drip.  Tawnya Crook, PharmD 01/23/2020 12:09 PM

## 2020-01-23 NOTE — Progress Notes (Signed)
CRITICAL CARE PROGRESS NOTE    Name: Natalie [name] MRN: [id number] DOB: [birth year]     LOS: 3   SUBJECTIVE FINDINGS & SIGNIFICANT EVENTS    Patient description:  59 yo female admitted with DKA requiring insulin gtt after running out of her insulingtt. Patient comatose.   SIGNIFICANT EVENTS/STUDIES: 08/4: Pt admitted to ICU with DKA 8/5 severe DKA and unresponsive state.  8/6- DKA regimen reviewed with Diabetic coordinator and adjusted, patient remains unresponsive despite no sedation today.   Lines/tubes : Airway 7.5 mm (Active)  Secured at (cm) 23 cm 01/23/20 0655  Measured From Lips 01/23/20 0655  Secured Location Right 01/23/20 0655  Secured By [employer] 01/23/20 0655  Tube Holder Repositioned Yes 01/23/20 0655  Cuff Pressure (cm H2O) 28 cm H2O 01/23/20 0655  Site Condition Cool;Dry 01/23/20 0655     NG/OG Tube Nasogastric Right nare Xray 60 cm (Active)  Cm Marking at Nare/Corner of Mouth (if applicable) 58 cm 43/32/95 1945  External Length of Tube (cm) - (if applicable) 59 cm 18/84/16 1930  Site Assessment Clean;Dry;Intact 01/22/20 1930  Ongoing Placement Verification No change in cm markings or external length of tube from initial placement 01/22/20 1930  Status Infusing tube feed 01/22/20 1930  Drainage Appearance None 01/22/20 1930  Intake (mL) 60 mL 01/22/20 1720  Output (mL) 0 mL 01/21/20 1537     Urethral Catheter (Active)  Indication for Insertion or Continuance of Catheter Unstable critically ill patients first 24-48 hours (See Criteria) 01/22/20 1600  Site Assessment Clean;Intact;Dry 01/22/20 2000  Catheter Maintenance Bag below level of bladder;Catheter secured;Drainage bag/tubing not touching floor;Insertion date on drainage bag;No dependent loops;Seal intact;Bag  emptied prior to transport 01/22/20 2000  Collection Container Standard drainage bag 01/22/20 2000  Securement Method Securing device (Describe) 01/22/20 2000  Urinary Catheter Interventions (if applicable) Unclamped 60/63/01 2000  Output (mL) 1650 mL 01/23/20 0615    Microbiology/Sepsis markers: Results for orders placed or performed during the hospital encounter of 01/20/20  SARS Coronavirus 2 by RT PCR (hospital order, performed in [facility] hospital lab) Nasopharyngeal Nasopharyngeal Swab     Status: None   Collection Time: 01/21/20 12:45 AM   Specimen: Nasopharyngeal Swab  Result Value Ref Range Status   SARS Coronavirus 2 NEGATIVE NEGATIVE Final    Comment: (NOTE) SARS-CoV-2 target nucleic acids are NOT DETECTED.  The SARS-CoV-2 RNA is generally detectable in upper and lower respiratory specimens during the acute phase of infection. The lowest concentration of SARS-CoV-2 viral copies this assay can detect is 250 copies / mL. A negative result does not preclude SARS-CoV-2 infection and should not be used as the sole basis for treatment or other patient management decisions.  A negative result may occur with improper specimen collection / handling, submission of specimen other than nasopharyngeal swab, presence of viral mutation(s) within the areas targeted by this assay, and inadequate number of viral copies (<250 copies / mL). A negative result must be combined with clinical observations, patient history, and epidemiological information.  Fact Sheet for Patients:   StrictlyIdeas.no  Fact Sheet for Healthcare Providers: BankingDealers.[name].za  This test is not yet approved or  cleared by the [place] FDA and has been authorized for detection and/or diagnosis of SARS-CoV-2 by FDA under an Emergency Use Authorization (EUA).  This EUA will remain in effect (meaning this test can be used) for the duration of the COVID-19  declaration under Section 564(b)(1) of the Act, 21 U.S.C. section 360bbb-3(b)(1), unless the authorization  is terminated or revoked sooner.  Performed at [facility], [address] [place], [place] [address]   CULTURE, BLOOD (ROUTINE X 2) w Reflex to ID Panel     Status: None (Preliminary result)   Collection Time: 01/21/20  3:24 AM   Specimen: BLOOD RIGHT FOREARM  Result Value Ref Range Status   Specimen Description BLOOD RIGHT FOREARM  Final   Special Requests   Final    BOTTLES DRAWN AEROBIC AND ANAEROBIC Blood Culture adequate volume   Culture   Final    NO GROWTH 2 DAYS Performed at [facility], [address], [place], [place] [address]    Report Status PENDING  Incomplete  CULTURE, BLOOD (ROUTINE X 2) w Reflex to ID Panel     Status: None (Preliminary result)   Collection Time: 01/21/20  3:24 AM   Specimen: BLOOD  Result Value Ref Range Status   Specimen Description BLOOD LEFT ANTECUBITAL  Final   Special Requests   Final    BOTTLES DRAWN AEROBIC AND ANAEROBIC Blood Culture adequate volume   Culture   Final    NO GROWTH 2 DAYS Performed at [facility], [address] [place], [place] [address]    Report Status PENDING  Incomplete  Urine Culture     Status: None   Collection Time: 01/21/20  5:06 AM   Specimen: Urine, Random  Result Value Ref Range Status   Specimen Description   Final    URINE, RANDOM Performed at [facility], [address], [place], [place] [address]    Special Requests   Final    NONE Performed at [facility], [address] [place], [place] [address]    Culture   Final    NO GROWTH Performed at [facility], [place] [address], [place], [place] [address]    Report Status 01/22/2020 FINAL  Final    Anti-infectives:  Anti-infectives (From admission, onward)   Start     Dose/Rate Route Frequency Ordered Stop   01/22/20 0800  ceFEPIme (MAXIPIME) 2 g in sodium chloride 0.9 % 100 mL  IVPB  Status:  Discontinued        2 g 200 mL/hr over 30 Minutes Intravenous Every 24 hours 01/21/20 1541 01/22/20 1032   01/21/20 0700  ceFEPIme (MAXIPIME) 2 g in sodium chloride 0.9 % 100 mL IVPB  Status:  Discontinued        2 g 200 mL/hr over 30 Minutes Intravenous Every 12 hours 01/21/20 0607 01/21/20 1541       PAST MEDICAL HISTORY   No past medical history on file.   SURGICAL HISTORY   None    FAMILY HISTORY   No family history on file.   SOCIAL HISTORY   Social History   Tobacco Use  . Smoking status: Not on file  Substance Use Topics  . Alcohol use: Not on file  . Drug use: Not on file     MEDICATIONS   Current Medication:  Current Facility-Administered Medications:  .  0.9 %  sodium chloride infusion, 250 mL, Intravenous, Continuous, Awilda Bill, NP, Stopped at 01/22/20 1720 .  0.9 %  sodium chloride infusion, , Intravenous, Continuous, Kasa, Kurian, MD, Last Rate: 75 mL/hr at 01/23/20 0800, Rate Verify at 01/23/20 0800 .  0.9 %  sodium chloride infusion, , Intravenous, Continuous, Milliani Herrada, MD .  chlorhexidine gluconate (MEDLINE KIT) (PERIDEX) 0.12 % solution 15 mL, 15 mL, Mouth Rinse, BID, Kasa,  Maretta Bees, MD, 15 mL at 01/23/20 0827 .  Chlorhexidine Gluconate Cloth 2 % PADS 6 each, 6 each, Topical, Daily, Flora Lipps, MD, 6 each at 01/22/20 0919 .  citalopram (CELEXA) tablet 20 mg, 20 mg, Per Tube, Daily, Kasa, Kurian, MD, 20 mg at 01/22/20 1206 .  dextrose 5 %-0.45 % sodium chloride infusion, , Intravenous, Continuous, Flora Lipps, MD, Paused at 01/22/20 1639 .  dextrose 5 %-0.45 % sodium chloride infusion, , Intravenous, Continuous, Keyasia Jolliff, MD .  dextrose 50 % solution 0-50 mL, 0-50 mL, Intravenous, PRN, Lanney Gins, Cristoval Teall, MD .  docusate (COLACE) 50 MG/5ML liquid 100 mg, 100 mg, Oral, BID, Awilda Bill, NP, 100 mg at 01/22/20 2139 .  feeding supplement (VITAL AF 1.2 CAL) liquid 1,000 mL, 1,000 mL, Per Tube, Continuous, Kasa,  Kurian, MD, Last Rate: 60 mL/hr at 01/22/20 1600, Rate Verify at 01/22/20 1600 .  fentaNYL (SUBLIMAZE) bolus via infusion 50 mcg, 50 mcg, Intravenous, Q15 min PRN, Awilda Bill, NP, 50 mcg at 01/22/20 0914 .  fentaNYL (SUBLIMAZE) injection 50 mcg, 50 mcg, Intravenous, Once, Blakeney, Dana G, NP .  fentaNYL 2517mg in NS 2570m(1038mml) infusion-PREMIX, 0-400 mcg/hr, Intravenous, Continuous, Kasa, Kurian, MD, Last Rate: 25 mL/hr at 01/23/20 0244, 250 mcg/hr at 01/23/20 0244 .  heparin injection 5,000 Units, 5,000 Units, Subcutaneous, Q8H, BlaAwilda BillP, 5,000 Units at 01/23/20 0511 .  insulin regular, human (MYXREDLIN) 100 units/ 100 mL infusion, , Intravenous, Continuous, Lilton Pare, MD .  LORazepam (ATIVAN) tablet 1 mg, 1 mg, Per Tube, BID, KasFlora LippsD, 1 mg at 01/22/20 2139 .  MEDLINE mouth rinse, 15 mL, Mouth Rinse, 10 times per day, KasFlora LippsD, 15 mL at 01/23/20 0936 .  midazolam (VERSED) 50 mg/50 mL (1 mg/mL) premix infusion, 0.5-10 mg/hr, Intravenous, Continuous, Kasa, Kurian, MD .  midazolam (VERSED) injection 2 mg, 2 mg, Intravenous, Q2H PRN, BlaAwilda BillP, 2 mg at 01/22/20 0519 .  norepinephrine (LEVOPHED) '4mg'$  in 250m58memix infusion, 2-10 mcg/min, Intravenous, Titrated, BlakAwilda Bill, Stopped at 01/22/20 1546 .  ondansetron (ZOFRAN) injection 4 mg, 4 mg, Intravenous, Q6H PRN, BlakAwilda Bill, 4 mg at 01/21/20 0021 .  pantoprazole sodium (PROTONIX) 40 mg/20 mL oral suspension 40 mg, 40 mg, Per Tube, Daily, KasaFlora Lipps, 40 mg at 01/22/20 0926 .  polyethylene glycol (MIRALAX / GLYCOLAX) packet 17 g, 17 g, Oral, Daily, BlakAwilda Bill, 17 g at 01/22/20 0925 .  potassium PHOSPHATE 30 mmol in dextrose 5 % 500 mL infusion, 30 mmol, Intravenous, Once, Kasa, Kurian, MD .  sodium chloride flush (NS) 0.9 % injection 10-40 mL, 10-40 mL, Intracatheter, Q12H, Kasa, Kurian, MD, 10 mL at 01/22/20 2140 .  sodium chloride flush (NS) 0.9 % injection  10-40 mL, 10-40 mL, Intracatheter, PRN, KasaFlora Lipps    ALLERGIES   Patient has no known allergies.    REVIEW OF SYSTEMS     Unable to Obtain due to  GCS-7T  PHYSICAL EXAMINATION   Vital Signs: Temp:  [99.7 F (37.6 C)-100.5 F (38.1 C)] 100.1 F (37.8 C) (08/06 0800) Pulse Rate:  [93-118] 112 (08/06 0800) Resp:  [16-18] 16 (08/06 0800) BP: (104-143)/(56-77) 143/77 (08/06 0800) SpO2:  [96 %-100 %] 100 % (08/06 0800) FiO2 (%):  [28 %-40 %] 40 % (08/06 0800) Weight:  [93.3 kg] 93.3 kg (08/06 0500)  GENERAL:Age appropirate HEAD: Normocephalic, atraumatic.  EYES: Pupils equal, round, reactive to light.  No scleral icterus.  MOUTH: Moist mucosal membrane. NECK: Supple. No thyromegaly. No nodules. No JVD.  PULMONARY: mildly rhonchorous breath sounds CARDIOVASCULAR: S1 and S2. Regular rate and rhythm. No murmurs, rubs, or gallops.  GASTROINTESTINAL: Soft, nontender,obese,  non-distended. No masses. Positive bowel sounds. No hepatosplenomegaly.  MUSCULOSKELETAL: No swelling, clubbing, or edema.  NEUROLOGIC: GCS7T SKIN:intact,warm,dry   PERTINENT DATA     Infusions: . sodium chloride Stopped (01/22/20 1720)  . sodium chloride 75 mL/hr at 01/23/20 0800  . sodium chloride    . dextrose 5 % and 0.45% NaCl Stopped (01/22/20 1639)  . dextrose 5 % and 0.45% NaCl    . feeding supplement (VITAL AF 1.2 CAL) 60 mL/hr at 01/22/20 1600  . fentaNYL infusion INTRAVENOUS 250 mcg/hr (01/23/20 0244)  . insulin    . midazolam    . norepinephrine (LEVOPHED) Adult infusion Stopped (01/22/20 1546)  . potassium PHOSPHATE IVPB (in mmol)     Scheduled Medications: . chlorhexidine gluconate (MEDLINE KIT)  15 mL Mouth Rinse BID  . Chlorhexidine Gluconate Cloth  6 each Topical Daily  . citalopram  20 mg Per Tube Daily  . docusate  100 mg Oral BID  . fentaNYL (SUBLIMAZE) injection  50 mcg Intravenous Once  . heparin  5,000 Units Subcutaneous Q8H  . LORazepam  1 mg Per Tube BID  .  mouth rinse  15 mL Mouth Rinse 10 times per day  . pantoprazole sodium  40 mg Per Tube Daily  . polyethylene glycol  17 g Oral Daily  . sodium chloride flush  10-40 mL Intracatheter Q12H   PRN Medications: dextrose, fentaNYL, midazolam, ondansetron (ZOFRAN) IV, sodium chloride flush Hemodynamic parameters:   Intake/Output: 08/05 0701 - 08/06 0700 In: 4181.6 [I.V.:2561.3; NG/GT:1343.1; IV Piggyback:277.2] Out: 2925 [Urine:2925]  Ventilator  Settings: Vent Mode: PRVC FiO2 (%):  [28 %-40 %] 40 % Set Rate:  [18 bmp] 18 bmp Vt Set:  [500 mL] 500 mL PEEP:  [5 cmH20-8 cmH20] 8 cmH20 Plateau Pressure:  [17 cmH20-23 cmH20] 22 cmH20   LAB RESULTS:  Basic Metabolic Panel: Recent Labs  Lab 01/21/20 0325 01/21/20 0740 01/22/20 0002 01/22/20 0415 01/22/20 1404 01/22/20 1404 01/22/20 1803 01/22/20 1803 01/22/20 2149 01/22/20 2149 01/23/20 0323 01/23/20 0717  NA  --    < > 137   < > 135  --  135  --  135  --  138 139  K  --    < > 3.5   < > 2.7*   < > 3.9   < > 3.2*   < > 4.2 3.8  CL  --    < > 100   < > 93*  --  93*  --  97*  --  102 104  CO2  --    < > 22   < > 27  --  25  --  26  --  26 23  GLUCOSE  --    < > 312*   < > 289*  --  433*  --  402*  --  330* 276*  BUN  --    < > 45*   < > 41*  --  40*  --  39*  --  37* 36*  CREATININE  --    < > 3.35*   < > 3.21*  --  3.01*  --  2.89*  --  2.83* 2.82*  CALCIUM  --    < > 8.3*   < > 8.0*  --  7.7*  --  7.5*  --  7.6* 7.9*  MG 2.8*  --  2.2  --   --   --   --   --   --   --  1.8  --   PHOS 6.0*  --  <1.0*  --   --   --   --   --   --   --  <1.0*  --    < > = values in this interval not displayed.   Liver Function Tests: Recent Labs  Lab 01/20/20 2216  AST 26  ALT 23  ALKPHOS 89  BILITOT 2.5*  PROT 7.6  ALBUMIN 4.2   No results for input(s): LIPASE, AMYLASE in the last 168 hours. No results for input(s): AMMONIA in the last 168 hours. CBC: Recent Labs  Lab 01/20/20 2216 01/21/20 0325 01/22/20 0415 01/23/20 0323  WBC  15.1* 13.6* 5.1 4.5  HGB 12.9 13.4 9.8* 9.8*  HCT 40.1 40.6 26.5* 27.0*  MCV 99.5 96.9 89.5 90.0  PLT 231 169 89* 68*   Cardiac Enzymes: No results for input(s): CKTOTAL, CKMB, CKMBINDEX, TROPONINI in the last 168 hours. BNP: Invalid input(s): POCBNP CBG: Recent Labs  Lab 01/23/20 0502 01/23/20 0608 01/23/20 0721 01/23/20 0821 01/23/20 0930  GLUCAP 309* 282* 272* 277* 248*       IMAGING RESULTS:  Imaging: No results found. _0 @ No results found.   ASSESSMENT AND PLAN    -Multidisciplinary rounds held today  Acute hypoxemic respiratory failure  - due to DKA with encephalopathy  - patient s/p intubation - weaning FiO2  -will continue to stay off of sedation to plan for SBT and liberation from MV - stop OGT nourishment in am   Diabetic Keto Acidosis  - DKA protocol phase 1-3  - discussed with coordinator and RN  - insulin gtt modified   -urine and blood cultures negative thus far.    Altered mental status with encephalopathy  - due to AKI with recirculation of sedatives and DKA related encephalopathy and metabolic encephalopathy due electrolyte derrangements and Uremic encephalopathy due to AKI   - will correct electrolytes, improve AKI, d/c centrally acting RX, treat DKA -CTH negative for acute abnormalities ICU telemetry monitoring   Acute Renal Failure stage 3  - due to decreased effective circulating volume in setting of DKA  - treat underlying cause    - d/c nephrotoxic meds -follow chem 7 -follow UO -continue Foley Catheter-assess need daily  Morbid obesity   - query OSA   - hindering liberation from MV   ID -continue IV abx as prescibed -follow up cultures  GI/Nutrition GI PROPHYLAXIS as indicated DIET-->TF's as tolerated Constipation protocol as indicated  ENDO - ICU hypoglycemic\Hyperglycemia protocol -check FSBS per protocol   ELECTROLYTES -follow labs as needed -replace as needed -pharmacy consultation   DVT/GI  PRX ordered -SCDs  TRANSFUSIONS AS NEEDED MONITOR FSBS ASSESS the need for LABS as needed   Critical care provider statement:    Critical care time (minutes):  109   Critical care time was exclusive of:  Separately billable procedures and treating other patients   Critical care was necessary to treat or prevent imminent or life-threatening deterioration of the following conditions:  DKA, encephalopathy, AKI, multiple comoribid conditions   Critical care was time spent personally by me on the following activities:  Development of treatment plan with patient or surrogate, discussions with consultants, evaluation of patient's response to treatment, examination of patient, obtaining history from patient or surrogate, ordering and performing  treatments and interventions, ordering and review of laboratory studies and re-evaluation of patient's condition.  I assumed direction of critical care for this patient from another provider in my specialty: no    This document was prepared using Dragon voice recognition software and may include unintentional dictation errors.    Ottie Glazier, M.D.  Division of [place]

## 2020-01-23 NOTE — Progress Notes (Signed)
   01/23/20 1000  Clinical Encounter Type  Visited With Patient and family together  Visit Type Initial  Referral From Chaplain  Consult/Referral To Chaplain  After rounds chaplain notice son at patient's bedside. As chaplain spoke with son, she noticed his eyes teared up. Chaplain asked son to explain what his tears were about. Son said, "I don't like seeing my mother like this." He said he brought his mother here from [place] in April and she has been doing fine. Son said that when he found his mother on Wednesday, he knew this was behind not being able to get her medicine. Son said when they tried to get meds from [facility] that it costed over $4000. He was told that someone here would check on how patient can get her meds. Chaplain will reach out to case manager, asking them to contact son, either visit with him in the room or call on at [phone]. Son said he won't be working as long as his mother is in this condition. Chaplain will follow up with him later.

## 2020-01-23 NOTE — Progress Notes (Signed)
Inpatient Diabetes Program Recommendations  AACE/ADA: New Consensus Statement on Inpatient Glycemic Control   Target Ranges:  Prepandial:   less than 140 mg/dL      Peak postprandial:   less than 180 mg/dL (1-2 hours)      Critically ill patients:  140 - 180 mg/dL   Results for PASCHA, FOGAL (MRN [id number]) as of 01/23/2020 07:19  Ref. Range 01/23/2020 00:39 01/23/2020 01:36 01/23/2020 02:37 01/23/2020 03:52 01/23/2020 05:02 01/23/2020 06:08  Glucose-Capillary Latest Ref Range: 70 - 99 mg/dL 322 (H) 344 (H) 325 (H) 332 (H) 309 (H) 282 (H)   Review of Glycemic Control  Diabetes history:DM2 Outpatient Diabetes medications:Trulicity 1.5 mg Qweek, Jardiance 25 mg daily (out of DM medications for several days) Current orders for Inpatient glycemic control:IV insulin   Inpatient Diabetes Program Recommendations:    IV insulin: Glucose remains consistently 272-344 mg/dl over the past 7 hours despite IV insulin. Patient remains on ventilator and on tube feedings at this time as well. Recommend stopping IV insulin drip for 1 hour then restarting EndoTool as a new patient and use ICU Glycemic Control Phase 2 IV insulin Hyperglycemia Mode. Would also recommend starting initial insulin drip rate at 15 units/hour (would have to override EndoTool recommendation and use per MD order).  RN can also note clinical events in EndoTool for ventilator and tube feeding.  Thanks, Barnie Alderman, RN, MSN, CDE Diabetes Coordinator Inpatient Diabetes Program [phone] (Team Pager from 8am to 5pm)

## 2020-01-24 ENCOUNTER — Inpatient Hospital Stay: Payer: Medicaid Other

## 2020-01-24 LAB — BASIC METABOLIC PANEL
Anion gap: 13 (ref 5–15)
Anion gap: 13 (ref 5–15)
Anion gap: 14 (ref 5–15)
Anion gap: 16 — ABNORMAL HIGH (ref 5–15)
Anion gap: 16 — ABNORMAL HIGH (ref 5–15)
BUN: 32 mg/dL — ABNORMAL HIGH (ref 6–20)
BUN: 34 mg/dL — ABNORMAL HIGH (ref 6–20)
BUN: 34 mg/dL — ABNORMAL HIGH (ref 6–20)
BUN: 35 mg/dL — ABNORMAL HIGH (ref 6–20)
BUN: 38 mg/dL — ABNORMAL HIGH (ref 6–20)
CO2: 22 mmol/L (ref 22–32)
CO2: 26 mmol/L (ref 22–32)
CO2: 26 mmol/L (ref 22–32)
CO2: 26 mmol/L (ref 22–32)
CO2: 26 mmol/L (ref 22–32)
Calcium: 7.8 mg/dL — ABNORMAL LOW (ref 8.9–10.3)
Calcium: 8.1 mg/dL — ABNORMAL LOW (ref 8.9–10.3)
Calcium: 8.1 mg/dL — ABNORMAL LOW (ref 8.9–10.3)
Calcium: 8.3 mg/dL — ABNORMAL LOW (ref 8.9–10.3)
Calcium: 8.3 mg/dL — ABNORMAL LOW (ref 8.9–10.3)
Chloride: 104 mmol/L (ref 98–111)
Chloride: 106 mmol/L (ref 98–111)
Chloride: 106 mmol/L (ref 98–111)
Chloride: 107 mmol/L (ref 98–111)
Chloride: 107 mmol/L (ref 98–111)
Creatinine, Ser: 2.36 mg/dL — ABNORMAL HIGH (ref 0.44–1.00)
Creatinine, Ser: 2.38 mg/dL — ABNORMAL HIGH (ref 0.44–1.00)
Creatinine, Ser: 2.45 mg/dL — ABNORMAL HIGH (ref 0.44–1.00)
Creatinine, Ser: 2.53 mg/dL — ABNORMAL HIGH (ref 0.44–1.00)
Creatinine, Ser: 2.56 mg/dL — ABNORMAL HIGH (ref 0.44–1.00)
GFR calc Af Amer: 23 mL/min — ABNORMAL LOW (ref >60)
GFR calc Af Amer: 23 mL/min — ABNORMAL LOW (ref >60)
GFR calc Af Amer: 24 mL/min — ABNORMAL LOW (ref >60)
GFR calc Af Amer: 25 mL/min — ABNORMAL LOW (ref >60)
GFR calc Af Amer: 25 mL/min — ABNORMAL LOW (ref >60)
GFR calc non Af Amer: 20 mL/min — ABNORMAL LOW (ref >60)
GFR calc non Af Amer: 20 mL/min — ABNORMAL LOW (ref >60)
GFR calc non Af Amer: 21 mL/min — ABNORMAL LOW (ref >60)
GFR calc non Af Amer: 21 mL/min — ABNORMAL LOW (ref >60)
GFR calc non Af Amer: 22 mL/min — ABNORMAL LOW (ref >60)
Glucose, Bld: 141 mg/dL — ABNORMAL HIGH (ref 70–99)
Glucose, Bld: 153 mg/dL — ABNORMAL HIGH (ref 70–99)
Glucose, Bld: 221 mg/dL — ABNORMAL HIGH (ref 70–99)
Glucose, Bld: 305 mg/dL — ABNORMAL HIGH (ref 70–99)
Glucose, Bld: 378 mg/dL — ABNORMAL HIGH (ref 70–99)
Potassium: 3.2 mmol/L — ABNORMAL LOW (ref 3.5–5.1)
Potassium: 3.9 mmol/L (ref 3.5–5.1)
Potassium: 3.9 mmol/L (ref 3.5–5.1)
Potassium: 4.4 mmol/L (ref 3.5–5.1)
Potassium: 4.4 mmol/L (ref 3.5–5.1)
Sodium: 144 mmol/L (ref 135–145)
Sodium: 144 mmol/L (ref 135–145)
Sodium: 146 mmol/L — ABNORMAL HIGH (ref 135–145)
Sodium: 146 mmol/L — ABNORMAL HIGH (ref 135–145)
Sodium: 148 mmol/L — ABNORMAL HIGH (ref 135–145)

## 2020-01-24 LAB — GLUCOSE, CAPILLARY
Glucose-Capillary: 108 mg/dL — ABNORMAL HIGH (ref 70–99)
Glucose-Capillary: 129 mg/dL — ABNORMAL HIGH (ref 70–99)
Glucose-Capillary: 162 mg/dL — ABNORMAL HIGH (ref 70–99)
Glucose-Capillary: 168 mg/dL — ABNORMAL HIGH (ref 70–99)
Glucose-Capillary: 179 mg/dL — ABNORMAL HIGH (ref 70–99)
Glucose-Capillary: 179 mg/dL — ABNORMAL HIGH (ref 70–99)
Glucose-Capillary: 201 mg/dL — ABNORMAL HIGH (ref 70–99)
Glucose-Capillary: 305 mg/dL — ABNORMAL HIGH (ref 70–99)
Glucose-Capillary: 91 mg/dL (ref 70–99)

## 2020-01-24 LAB — MAGNESIUM: Magnesium: 1.7 mg/dL (ref 1.7–2.4)

## 2020-01-24 LAB — TRIGLYCERIDES: Triglycerides: 315 mg/dL — ABNORMAL HIGH (ref <150)

## 2020-01-24 LAB — PHOSPHORUS: Phosphorus: 1.8 mg/dL — ABNORMAL LOW (ref 2.5–4.6)

## 2020-01-24 LAB — PROCALCITONIN: Procalcitonin: >150 ng/mL

## 2020-01-24 IMAGING — DX DG CHEST 1V PORT
1 series · 1 of 1 positions shown · non-contrast
Comparison: [DATE] and [DATE]

CLINICAL DATA: Acute respiratory failure.

EXAM:
PORTABLE CHEST 1 VIEW

[chest ap]
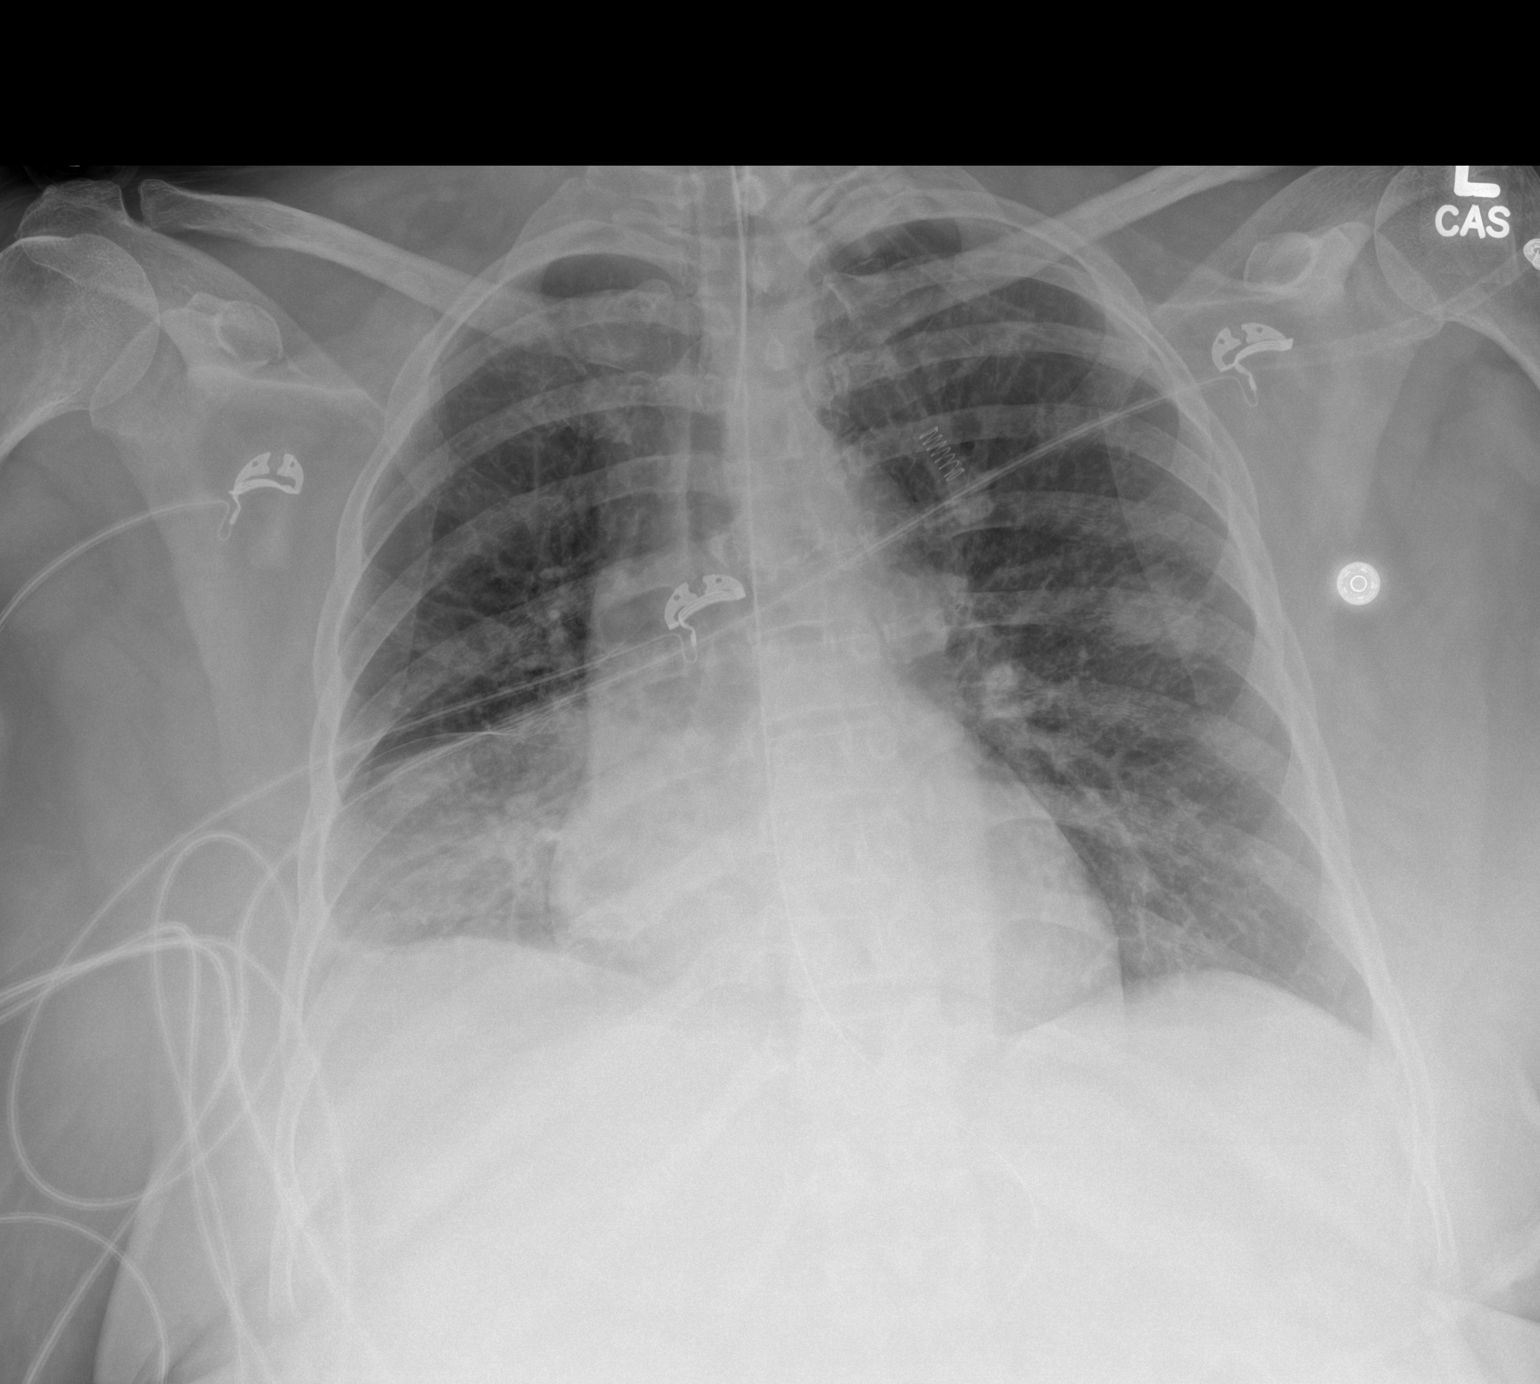

[1 of 1 positions shown; findings below may reference images not displayed]

FINDINGS: Endotracheal tube is 5.1 cm above the carina. Nasogastric tube
extends into the abdomen but the tip is beyond the image. Increased
densities at the right lung base. Focal rounded or nodular density
in the mid left lung is more conspicuous. Heart size is stable.
Negative for a pneumothorax.
IMPRESSION: 1. Increasing densities at the right lung base. Findings are
suggestive for right pleural fluid and likely airspace
disease/consolidation.
2. Focal density in the mid left lung has become more conspicuous
over the recent chest radiographs. Suspect this is a small focus of
airspace disease. Recommend attention on follow-up imaging.

## 2020-01-24 MED ORDER — INSULIN GLARGINE 100 UNIT/ML ~~LOC~~ SOLN
20.0000 [IU] | Freq: Every day | SUBCUTANEOUS | Status: DC
  Administered 2020-01-24: 20 [IU] via SUBCUTANEOUS
  Filled 2020-01-24 (×2): qty 0.2

## 2020-01-24 MED ORDER — INSULIN ASPART 100 UNIT/ML ~~LOC~~ SOLN
0.0000 [IU] | SUBCUTANEOUS | Status: DC
  Administered 2020-01-24: 11 [IU] via SUBCUTANEOUS
  Administered 2020-01-24: 15 [IU] via SUBCUTANEOUS
  Administered 2020-01-25: 5 [IU] via SUBCUTANEOUS
  Administered 2020-01-25: 3 [IU] via SUBCUTANEOUS
  Administered 2020-01-25 (×2): 8 [IU] via SUBCUTANEOUS
  Administered 2020-01-25 (×2): 5 [IU] via SUBCUTANEOUS
  Administered 2020-01-26 (×2): 2 [IU] via SUBCUTANEOUS
  Administered 2020-01-26: 3 [IU] via SUBCUTANEOUS
  Administered 2020-01-26: 2 [IU] via SUBCUTANEOUS
  Administered 2020-01-26 – 2020-01-27 (×3): 5 [IU] via SUBCUTANEOUS
  Administered 2020-01-27: 3 [IU] via SUBCUTANEOUS
  Filled 2020-01-24 (×17): qty 1

## 2020-01-24 MED ORDER — FENTANYL CITRATE (PF) 100 MCG/2ML IJ SOLN
100.0000 ug | Freq: Once | INTRAMUSCULAR | Status: AC
  Administered 2020-01-24: 100 ug via INTRAVENOUS

## 2020-01-24 MED ORDER — DEXTROSE-NACL 5-0.45 % IV SOLN: INTRAVENOUS | Status: DC

## 2020-01-24 MED ORDER — INSULIN ASPART 100 UNIT/ML ~~LOC~~ SOLN: 1.0000 [IU] | SUBCUTANEOUS | Status: DC

## 2020-01-24 MED ORDER — INSULIN GLARGINE 100 UNIT/ML ~~LOC~~ SOLN: 20.0000 [IU] | Freq: Every day | SUBCUTANEOUS | Status: DC

## 2020-01-24 MED ORDER — INSULIN DETEMIR 100 UNIT/ML ~~LOC~~ SOLN
15.0000 [IU] | Freq: Two times a day (BID) | SUBCUTANEOUS | Status: DC
Start: 2020-01-24 — End: 2020-01-24

## 2020-01-24 MED ORDER — MIDAZOLAM HCL 2 MG/2ML IJ SOLN: 2.0000 mg | Freq: Once | INTRAMUSCULAR | Status: DC

## 2020-01-24 MED ORDER — INSULIN GLARGINE 100 UNIT/ML ~~LOC~~ SOLN
10.0000 [IU] | Freq: Every day | SUBCUTANEOUS | Status: DC
  Administered 2020-01-25 – 2020-01-26 (×2): 10 [IU] via SUBCUTANEOUS
  Filled 2020-01-24 (×2): qty 0.1

## 2020-01-24 MED ORDER — INSULIN ASPART 100 UNIT/ML ~~LOC~~ SOLN: 3.0000 [IU] | SUBCUTANEOUS | Status: DC

## 2020-01-24 MED ORDER — POTASSIUM CHLORIDE 20 MEQ PO PACK
40.0000 meq | PACK | Freq: Once | ORAL | Status: AC
  Administered 2020-01-24: 40 meq
  Filled 2020-01-24: qty 2

## 2020-01-24 MED ORDER — DEXTROSE 10 % IV SOLN: INTRAVENOUS | Status: DC

## 2020-01-24 NOTE — Progress Notes (Signed)
[place] visited pt. per OR; pt. sitting up in bed, intubated but appearing to be aware of her son's presence in chair at bedside.  Son shared pt. recently relocated to [facility] from [place] and has had trouble getting necessary diabetes meds --> this led to current hospital admission. Son requests meeting w/social worker to discuss options for getting meds for pt.; per chart note, [facility] Bo has been following situation and already attempted social work contact yesterday, but it did not appear SW had been able to follow up yet.  [place] made phone call to SW Katharine Look who plans to visit pt./son later today if possible. No other apparent needs at this time.

## 2020-01-24 NOTE — Progress Notes (Signed)
PHARMACY CONSULT NOTE - FOLLOW UP  Pharmacy Consult for Electrolyte Monitoring and Replacement   Recent Labs: Potassium (mmol/L)  Date Value  01/23/2020 3.2 (L)   Magnesium (mg/dL)  Date Value  01/23/2020 1.8   Calcium (mg/dL)  Date Value  01/23/2020 8.1 (L)   Albumin (g/dL)  Date Value  01/20/2020 4.2   Phosphorus (mg/dL)  Date Value  01/23/2020 <1.0 (LL)   Sodium (mmol/L)  Date Value  01/23/2020 146 (H)   Assessment: 59 year old female admitted to the ICU intubated and sedated. Patient with DKA on insulin drip, now off bicarbonate drip with resolution of acidosis. Pharmacy to manage electrolytes.  Goal of Therapy:  Electrolytes WNL  Plan:  --8/6 at 2355 K 3.2, below goal.  Will give KCL 67meq per tube x 1 and f/u K+ w/ next labs.  Pt remains on insulin drip. --All other electrolytes with morning labs  Hart Robinsons A 01/24/2020 1:12 AM

## 2020-01-24 NOTE — Progress Notes (Signed)
Extubated patient and placed on 4 LPM [place] tolerated good. HR 113 RR 20 Sat 97%.

## 2020-01-24 NOTE — Progress Notes (Signed)
CRITICAL CARE PROGRESS NOTE    Name: Sharde Gover MRN: [id number] DOB: 09-Mar-1961     LOS: 4   SUBJECTIVE FINDINGS & SIGNIFICANT EVENTS    Patient description:  59 yo female admitted with DKA requiring insulin gtt after running out of her insulingtt. Patient comatose.   SIGNIFICANT EVENTS/STUDIES: 08/4: Pt admitted to ICU with DKA 8/5 severe DKA and unresponsive state.  8/6- DKA regimen reviewed with Diabetic coordinator and adjusted, patient remains unresponsive despite no sedation today.  01/24/20 - Patient medically optimized with resolution of DKA , s/p liberation from Mechanical intubation. Care plan reviewed with son at bedside.    Lines/tubes : Airway 7.5 mm (Active)  Secured at (cm) 23 cm 01/23/20 0655  Measured From Lips 01/23/20 0655  Secured Location Right 01/23/20 0655  Secured By [employer] 01/23/20 0655  Tube Holder Repositioned Yes 01/23/20 0655  Cuff Pressure (cm H2O) 28 cm H2O 01/23/20 0655  Site Condition Cool;Dry 01/23/20 0655     NG/OG Tube Nasogastric Right nare Xray 60 cm (Active)  Cm Marking at Nare/Corner of Mouth (if applicable) 58 cm 26/94/85 1945  External Length of Tube (cm) - (if applicable) 59 cm 46/27/03 1930  Site Assessment Clean;Dry;Intact 01/22/20 1930  Ongoing Placement Verification No change in cm markings or external length of tube from initial placement 01/22/20 1930  Status Infusing tube feed 01/22/20 1930  Drainage Appearance None 01/22/20 1930  Intake (mL) 60 mL 01/22/20 1720  Output (mL) 0 mL 01/21/20 1537     Urethral Catheter (Active)  Indication for Insertion or Continuance of Catheter Unstable critically ill patients first 24-48 hours (See Criteria) 01/22/20 1600  Site Assessment Clean;Intact;Dry 01/22/20 2000  Catheter Maintenance  Bag below level of bladder;Catheter secured;Drainage bag/tubing not touching floor;Insertion date on drainage bag;No dependent loops;Seal intact;Bag emptied prior to transport 01/22/20 2000  Collection Container Standard drainage bag 01/22/20 2000  Securement Method Securing device (Describe) 01/22/20 2000  Urinary Catheter Interventions (if applicable) Unclamped 50/09/38 2000  Output (mL) 1650 mL 01/23/20 0615    Microbiology/Sepsis markers: Results for orders placed or performed during the hospital encounter of 01/20/20  SARS Coronavirus 2 by RT PCR (hospital order, performed in [facility] hospital lab) Nasopharyngeal Nasopharyngeal Swab     Status: None   Collection Time: 01/21/20 12:45 AM   Specimen: Nasopharyngeal Swab  Result Value Ref Range Status   SARS Coronavirus 2 NEGATIVE NEGATIVE Final    Comment: (NOTE) SARS-CoV-2 target nucleic acids are NOT DETECTED.  The SARS-CoV-2 RNA is generally detectable in upper and lower respiratory specimens during the acute phase of infection. The lowest concentration of SARS-CoV-2 viral copies this assay can detect is 250 copies / mL. A negative result does not preclude SARS-CoV-2 infection and should not be used as the sole basis for treatment or other patient management decisions.  A negative result may occur with improper specimen collection / handling, submission of specimen other than nasopharyngeal swab, presence of viral mutation(s) within the areas targeted by this assay, and inadequate number of viral copies (<250 copies / mL). A negative result must be combined with clinical observations, patient history, and epidemiological information.  Fact Sheet for Patients:   StrictlyIdeas.no  Fact Sheet for Healthcare Providers: BankingDealers.co.za  This test is not yet approved or  cleared by the [place] FDA and has been authorized for detection and/or diagnosis of SARS-CoV-2 by FDA  under an Emergency Use Authorization (EUA).  This EUA will remain in effect (meaning this  test can be used) for the duration of the COVID-19 declaration under Section 564(b)(1) of the Act, 21 U.S.C. section 360bbb-3(b)(1), unless the authorization is terminated or revoked sooner.  Performed at [facility], [address] [place], [place] [address]   CULTURE, BLOOD (ROUTINE X 2) w Reflex to ID Panel     Status: None (Preliminary result)   Collection Time: 01/21/20  3:24 AM   Specimen: BLOOD RIGHT FOREARM  Result Value Ref Range Status   Specimen Description BLOOD RIGHT FOREARM  Final   Special Requests   Final    BOTTLES DRAWN AEROBIC AND ANAEROBIC Blood Culture adequate volume   Culture   Final    NO GROWTH 3 DAYS Performed at [facility], [address], [place], [place] [address]    Report Status PENDING  Incomplete  CULTURE, BLOOD (ROUTINE X 2) w Reflex to ID Panel     Status: None (Preliminary result)   Collection Time: 01/21/20  3:24 AM   Specimen: BLOOD  Result Value Ref Range Status   Specimen Description BLOOD LEFT ANTECUBITAL  Final   Special Requests   Final    BOTTLES DRAWN AEROBIC AND ANAEROBIC Blood Culture adequate volume   Culture   Final    NO GROWTH 3 DAYS Performed at [facility], [address] [place], [place] [address]    Report Status PENDING  Incomplete  Urine Culture     Status: None   Collection Time: 01/21/20  5:06 AM   Specimen: Urine, Random  Result Value Ref Range Status   Specimen Description   Final    URINE, RANDOM Performed at [facility], [address], [place], [place] [address]    Special Requests   Final    NONE Performed at [facility], [address] [place], [place] [address]    Culture   Final    NO GROWTH Performed at [facility], [place] [address] [place], [place] [address]    Report Status 01/22/2020 FINAL  Final  Culture, respiratory (non-expectorated)      Status: None (Preliminary result)   Collection Time: 01/23/20  6:00 PM   Specimen: Tracheal Aspirate; Respiratory  Result Value Ref Range Status   Specimen Description   Final    TRACHEAL ASPIRATE Performed at [facility], [address], [place], [place] [address]    Special Requests   Final    NONE Performed at [facility], [address] [place], [place] [address]    Gram Stain   Final    FEW WBC PRESENT, PREDOMINANTLY PMN MODERATE GRAM NEGATIVE RODS FEW GRAM POSITIVE COCCI RARE GRAM POSITIVE RODS    Culture   Final    NO GROWTH < 12 HOURS Performed at [facility], [place] [address], [place], [place] [address]    Report Status PENDING  Incomplete  Culture, blood (routine x 2)     Status: None (Preliminary result)   Collection Time: 01/23/20  6:45 PM   Specimen: BLOOD  Result Value Ref Range Status   Specimen Description BLOOD BLOOD LEFT HAND  Final   Special Requests   Final    BOTTLES DRAWN AEROBIC AND ANAEROBIC Blood Culture adequate volume   Culture   Final    NO GROWTH < 12 HOURS Performed at [facility], [address] [place],  [address]    Report Status PENDING  Incomplete  Culture, blood (routine x 2)  Status: None (Preliminary result)   Collection Time: 01/23/20  7:56 PM   Specimen: BLOOD  Result Value Ref Range Status   Specimen Description BLOOD BLOOD RIGHT HAND  Final   Special Requests   Final    BOTTLES DRAWN AEROBIC AND ANAEROBIC Blood Culture results may not be optimal due to an excessive volume of blood received in culture bottles   Culture   Final    NO GROWTH < 12 HOURS Performed at [facility], [address] [place], [place] [address]    Report Status PENDING  Incomplete    Anti-infectives:  Anti-infectives (From admission, onward)   Start     Dose/Rate Route Frequency Ordered Stop   01/25/20 0600  vancomycin (VANCOCIN) IVPB 1000 mg/200 mL premix     Discontinue     1,000 mg 200  mL/hr over 60 Minutes Intravenous Every 48 hours 01/23/20 1850     01/23/20 2000  piperacillin-tazobactam (ZOSYN) IVPB 3.375 g     Discontinue     3.375 g 12.5 mL/hr over 240 Minutes Intravenous Every 8 hours 01/23/20 1816     01/23/20 2000  vancomycin (VANCOREADY) IVPB 1500 mg/300 mL        1,500 mg 150 mL/hr over 120 Minutes Intravenous  Once 01/23/20 1816 01/23/20 2230   01/22/20 0800  ceFEPIme (MAXIPIME) 2 g in sodium chloride 0.9 % 100 mL IVPB  Status:  Discontinued        2 g 200 mL/hr over 30 Minutes Intravenous Every 24 hours 01/21/20 1541 01/22/20 1032   01/21/20 0700  ceFEPIme (MAXIPIME) 2 g in sodium chloride 0.9 % 100 mL IVPB  Status:  Discontinued        2 g 200 mL/hr over 30 Minutes Intravenous Every 12 hours 01/21/20 0607 01/21/20 1541       PAST MEDICAL HISTORY   No past medical history on file.   SURGICAL HISTORY   None    FAMILY HISTORY   No family history on file.   SOCIAL HISTORY   Social History   Tobacco Use  . Smoking status: Not on file  Substance Use Topics  . Alcohol use: Not on file  . Drug use: Not on file     MEDICATIONS   Current Medication:  Current Facility-Administered Medications:  .  0.9 %  sodium chloride infusion, 250 mL, Intravenous, Continuous, Blakeney, Dreama Saa, NP, Stopped at 01/22/20 1720 .  acetaminophen (TYLENOL) tablet 650 mg, 650 mg, Oral, Q4H PRN, Ottie Glazier, MD, 650 mg at 01/24/20 1038 .  chlorhexidine gluconate (MEDLINE KIT) (PERIDEX) 0.12 % solution 15 mL, 15 mL, Mouth Rinse, BID, Kasa, Kurian, MD, 15 mL at 01/24/20 0800 .  Chlorhexidine Gluconate Cloth 2 % PADS 6 each, 6 each, Topical, Daily, Flora Lipps, MD, 6 each at 01/24/20 [phone] .  citalopram (CELEXA) tablet 20 mg, 20 mg, Per Tube, Daily, Kasa, Kurian, MD, 20 mg at 01/24/20 1024 .  dextrose 10 % infusion, , Intravenous, Continuous, Starlet Gallentine, MD, Last Rate: 20 mL/hr at 01/24/20 1700, Rate Verify at 01/24/20 1700 .  docusate (COLACE) 50 MG/5ML  liquid 100 mg, 100 mg, Oral, BID, Awilda Bill, NP, 100 mg at 01/24/20 1024 .  feeding supplement (VITAL AF 1.2 CAL) liquid 1,000 mL, 1,000 mL, Per Tube, Continuous, Flora Lipps, MD, Paused at 01/23/20 1400 .  fentaNYL (SUBLIMAZE) bolus via infusion 50 mcg, 50 mcg, Intravenous, Q15 min PRN, Awilda Bill, NP, 50 mcg at 01/22/20 0914 .  fentaNYL (  SUBLIMAZE) injection 50 mcg, 50 mcg, Intravenous, Once, Blakeney, Dana G, NP .  fentaNYL 252mg in NS 2565m(1011mml) infusion-PREMIX, 0-400 mcg/hr, Intravenous, Continuous, Kasa, Kurian, MD, Last Rate: 25 mL/hr at 01/23/20 0244, 250 mcg/hr at 01/23/20 0244 .  heparin injection 5,000 Units, 5,000 Units, Subcutaneous, Q8H, BlaAwilda BillP, 5,000 Units at 01/24/20 1548 .  insulin aspart (novoLOG) injection 0-15 Units, 0-15 Units, Subcutaneous, Q4H, AleOttie GlazierD, 11 Units at 01/24/20 1632 .  insulin glargine (LANTUS) injection 20 Units, 20 Units, Subcutaneous, Daily, AleOttie GlazierD, 20 Units at 01/24/20 1126 .  LORazepam (ATIVAN) tablet 1 mg, 1 mg, Per Tube, BID, KasFlora LippsD, 1 mg at 01/23/20 2236 .  MEDLINE mouth rinse, 15 mL, Mouth Rinse, 10 times per day, KasFlora LippsD, 15 mL at 01/24/20 1149 .  midazolam (VERSED) 50 mg/50 mL (1 mg/mL) premix infusion, 0.5-10 mg/hr, Intravenous, Continuous, Kasa, Kurian, MD .  midazolam (VERSED) injection 2 mg, 2 mg, Intravenous, Q2H PRN, BlaAwilda BillP, 2 mg at 01/24/20 0721 .  midazolam (VERSED) injection 2 mg, 2 mg, Intravenous, Once, Tukov-Yual, Magdalene S, NP .  ondansetron (ZOFRAN) injection 4 mg, 4 mg, Intravenous, Q6H PRN, BlaAwilda BillP, 4 mg at 01/21/20 0021 .  pantoprazole sodium (PROTONIX) 40 mg/20 mL oral suspension 40 mg, 40 mg, Per Tube, Daily, Kasa, Kurian, MD, 40 mg at 01/24/20 1024 .  piperacillin-tazobactam (ZOSYN) IVPB 3.375 g, 3.375 g, Intravenous, Q8H, GruDallie PilesPH, Last Rate: 12.5 mL/hr at 01/24/20 1700, Rate Verify at 01/24/20 1700 .  polyethylene  glycol (MIRALAX / GLYCOLAX) packet 17 g, 17 g, Oral, Daily, Blakeney, Dana G, NP, 17 g at 01/24/20 1024 .  sodium chloride flush (NS) 0.9 % injection 10-40 mL, 10-40 mL, Intracatheter, Q12H, Kasa, Kurian, MD, 10 mL at 01/23/20 2237 .  sodium chloride flush (NS) 0.9 % injection 10-40 mL, 10-40 mL, Intracatheter, PRN, KasFlora LippsD .  [STDerrill Memo 01/25/2020] vancomycin (VANCOCIN) IVPB 1000 mg/200 mL premix, 1,000 mg, Intravenous, Q48H, GruDallie PilesPH    ALLERGIES   Patient has no known allergies.    REVIEW OF SYSTEMS     Unable to Obtain due to  GCS-7T  PHYSICAL EXAMINATION   Vital Signs: Temp:  [98.9 F (37.2 C)-102.4 F (39.1 C)] 98.9 F (37.2 C) (08/07 1626) Pulse Rate:  [87-125] 117 (08/07 1626) Resp:  [9-25] 16 (08/07 1700) BP: (106-149)/(53-104) 106/80 (08/07 1700) SpO2:  [89 %-100 %] 97 % (08/07 1626) FiO2 (%):  [30 %-60 %] 30 % (08/07 1300)  GENERAL:Age appropirate HEAD: Normocephalic, atraumatic.  EYES: Pupils equal, round, reactive to light.  No scleral icterus.  MOUTH: Moist mucosal membrane. NECK: Supple. No thyromegaly. No nodules. No JVD.  PULMONARY: mildly rhonchorous breath sounds CARDIOVASCULAR: S1 and S2. Regular rate and rhythm. No murmurs, rubs, or gallops.  GASTROINTESTINAL: Soft, nontender,obese,  non-distended. No masses. Positive bowel sounds. No hepatosplenomegaly.  MUSCULOSKELETAL: No swelling, clubbing, or edema.  NEUROLOGIC: GCS7T SKIN:intact,warm,dry   PERTINENT DATA     Infusions: . sodium chloride Stopped (01/22/20 1720)  . dextrose 20 mL/hr at 01/24/20 1700  . feeding supplement (VITAL AF 1.2 CAL) Stopped (01/23/20 1400)  . fentaNYL infusion INTRAVENOUS 250 mcg/hr (01/23/20 0244)  . midazolam    . piperacillin-tazobactam (ZOSYN)  IV 12.5 mL/hr at 01/24/20 1700  . [START ON 01/25/2020] vancomycin     Scheduled Medications: . chlorhexidine gluconate (MEDLINE KIT)  15 mL Mouth Rinse BID  . Chlorhexidine Gluconate Cloth  6 each  Topical Daily  . citalopram  20 mg Per Tube Daily  . docusate  100 mg Oral BID  . fentaNYL (SUBLIMAZE) injection  50 mcg Intravenous Once  . heparin  5,000 Units Subcutaneous Q8H  . insulin aspart  0-15 Units Subcutaneous Q4H  . insulin glargine  20 Units Subcutaneous Daily  . LORazepam  1 mg Per Tube BID  . mouth rinse  15 mL Mouth Rinse 10 times per day  . midazolam  2 mg Intravenous Once  . pantoprazole sodium  40 mg Per Tube Daily  . polyethylene glycol  17 g Oral Daily  . sodium chloride flush  10-40 mL Intracatheter Q12H   PRN Medications: acetaminophen, fentaNYL, midazolam, ondansetron (ZOFRAN) IV, sodium chloride flush Hemodynamic parameters:   Intake/Output: 08/06 0701 - 08/07 0700 In: 3321.8 [I.V.:2101.8; NG/GT:660; IV Piggyback:560] Out: 2633 [Urine:3950]  Ventilator  Settings: Vent Mode: PRVC FiO2 (%):  [30 %-60 %] 30 % Set Rate:  [18 bmp] 18 bmp Vt Set:  [500 mL] 500 mL PEEP:  [5 cmH20] 5 cmH20 Pressure Support:  [8 cmH20] 8 cmH20 Plateau Pressure:  [23 cmH20] 23 cmH20   LAB RESULTS:  Basic Metabolic Panel: Recent Labs  Lab 01/21/20 0325 01/21/20 0740 01/22/20 0002 01/22/20 0415 01/23/20 0323 01/23/20 0717 01/23/20 1956 01/23/20 1956 01/23/20 2355 01/23/20 2355 01/24/20 0704 01/24/20 0704 01/24/20 1140 01/24/20 1555  NA  --    < > 137   < > 138   < > 143  --  146*  --  146*  --  148* 144  K  --    < > 3.5   < > 4.2   < > 3.6   < > 3.2*   < > 3.9   < > 3.9 4.4  CL  --    < > 100   < > 102   < > 103  --  107  --  107  --  106 106  CO2  --    < > 22   < > 26   < > 27  --  26  --  26  --  26 22  GLUCOSE  --    < > 312*   < > 330*   < > 264*  --  221*  --  141*  --  153* 305*  BUN  --    < > 45*   < > 37*   < > 34*  --  34*  --  32*  --  34* 35*  CREATININE  --    < > 3.35*   < > 2.83*   < > 2.60*  --  2.53*  --  2.56*  --  2.36* 2.45*  CALCIUM  --    < > 8.3*   < > 7.6*   < > 8.2*  --  8.1*  --  8.3*  --  8.3* 8.1*  MG 2.8*  --  2.2  --  1.8  --    --   --   --   --  1.7  --   --   --   PHOS 6.0*  --  <1.0*  --  <1.0*  --   --   --   --   --  1.8*  --   --   --    < > = values in this interval not displayed.   Liver Function Tests: Recent Labs  Lab 01/20/20 2216  AST 26  ALT 23  ALKPHOS 89  BILITOT 2.5*  PROT 7.6  ALBUMIN 4.2   No results for input(s): LIPASE, AMYLASE in the last 168 hours. No results for input(s): AMMONIA in the last 168 hours. CBC: Recent Labs  Lab 01/20/20 2216 01/21/20 0325 01/22/20 0415 01/23/20 0323  WBC 15.1* 13.6* 5.1 4.5  HGB 12.9 13.4 9.8* 9.8*  HCT 40.1 40.6 26.5* 27.0*  MCV 99.5 96.9 89.5 90.0  PLT 231 169 89* 68*   Cardiac Enzymes: No results for input(s): CKTOTAL, CKMB, CKMBINDEX, TROPONINI in the last 168 hours. BNP: Invalid input(s): POCBNP CBG: Recent Labs  Lab 01/24/20 0942 01/24/20 1043 01/24/20 1138 01/24/20 1259 01/24/20 1551  GLUCAP 91 108* 129* 201* 305*       IMAGING RESULTS:  Imaging: DG Abd 1 View  Result Date: 01/23/2020 CLINICAL DATA:  Abdominal distension. EXAM: ABDOMEN - 1 VIEW COMPARISON:  None. FINDINGS: A nasogastric tube is seen with its distal tip overlying the body of the stomach. The bowel gas pattern is normal. Radiopaque surgical clips are seen overlying the right upper quadrant. No radio-opaque calculi or other significant radiographic abnormality are seen. IMPRESSION: No evidence of bowel obstruction or ileus. Electronically Signed   By: Virgina Norfolk M.D.   On: 01/23/2020 21:38   DG Chest Port 1 View  Result Date: 01/24/2020 CLINICAL DATA:  Acute respiratory failure. EXAM: PORTABLE CHEST 1 VIEW COMPARISON:  01/23/2020 and 01/20/2020 FINDINGS: Endotracheal tube is 5.1 cm above the carina. Nasogastric tube extends into the abdomen but the tip is beyond the image. Increased densities at the right lung base. Focal rounded or nodular density in the mid left lung is more conspicuous. Heart size is stable. Negative for a pneumothorax. IMPRESSION: 1.  Increasing densities at the right lung base. Findings are suggestive for right pleural fluid and likely airspace disease/consolidation. 2. Focal density in the mid left lung has become more conspicuous over the recent chest radiographs. Suspect this is a small focus of airspace disease. Recommend attention on follow-up imaging. Electronically Signed   By: Markus Daft M.D.   On: 01/24/2020 08:43   DG Chest Port 1 View  Result Date: 01/23/2020 CLINICAL DATA:  Fever and chills EXAM: PORTABLE CHEST 1 VIEW COMPARISON:  01/21/2020 FINDINGS: Endotracheal tube tip is about 3.4 cm superior to the carina. Esophageal tube tip is below the diaphragm but incompletely visualized. Small bilateral effusions. Mild cardiomegaly with central vascular congestion. Hazy atelectasis at the lung bases. IMPRESSION: Small bilateral pleural effusions with hazy atelectasis at the lung bases. Mild cardiomegaly with central vascular congestion. Electronically Signed   By: Donavan Foil M.D.   On: 01/23/2020 19:41   @PROBHOSP @ DG Abd 1 View  Result Date: 01/23/2020 CLINICAL DATA:  Abdominal distension. EXAM: ABDOMEN - 1 VIEW COMPARISON:  None. FINDINGS: A nasogastric tube is seen with its distal tip overlying the body of the stomach. The bowel gas pattern is normal. Radiopaque surgical clips are seen overlying the right upper quadrant. No radio-opaque calculi or other significant radiographic abnormality are seen. IMPRESSION: No evidence of bowel obstruction or ileus. Electronically Signed   By: Virgina Norfolk M.D.   On: 01/23/2020 21:38   DG Chest Port 1 View  Result Date: 01/24/2020 CLINICAL DATA:  Acute respiratory failure. EXAM: PORTABLE CHEST 1 VIEW COMPARISON:  01/23/2020 and 01/20/2020 FINDINGS: Endotracheal tube is 5.1 cm above the carina. Nasogastric tube extends into the abdomen but the tip is beyond the image. Increased densities at the right  lung base. Focal rounded or nodular density in the mid left lung is more  conspicuous. Heart size is stable. Negative for a pneumothorax. IMPRESSION: 1. Increasing densities at the right lung base. Findings are suggestive for right pleural fluid and likely airspace disease/consolidation. 2. Focal density in the mid left lung has become more conspicuous over the recent chest radiographs. Suspect this is a small focus of airspace disease. Recommend attention on follow-up imaging. Electronically Signed   By: Markus Daft M.D.   On: 01/24/2020 08:43   DG Chest Port 1 View  Result Date: 01/23/2020 CLINICAL DATA:  Fever and chills EXAM: PORTABLE CHEST 1 VIEW COMPARISON:  01/21/2020 FINDINGS: Endotracheal tube tip is about 3.4 cm superior to the carina. Esophageal tube tip is below the diaphragm but incompletely visualized. Small bilateral effusions. Mild cardiomegaly with central vascular congestion. Hazy atelectasis at the lung bases. IMPRESSION: Small bilateral pleural effusions with hazy atelectasis at the lung bases. Mild cardiomegaly with central vascular congestion. Electronically Signed   By: Donavan Foil M.D.   On: 01/23/2020 19:41     ASSESSMENT AND PLAN    -Multidisciplinary rounds held today  Acute hypoxemic respiratory failure  - due to DKA with encephalopathy  - patient s/p intubation - weaning FiO2  -will continue to stay off of sedation to plan for SBT and liberation from MV - stop OGT nourishment in am   Diabetic Keto Acidosis-resolved  - DKA protocol phase 1-3  - discussed with coordinator and RN  - insulin gtt modified   -urine and blood cultures negative thus far.    Altered mental status with encephalopathy-resolved  - due to AKI with recirculation of sedatives and DKA related encephalopathy and metabolic encephalopathy due electrolyte derrangements and Uremic encephalopathy due to AKI   - will correct electrolytes, improve AKI, d/c centrally acting RX, treat DKA -CTH negative for acute abnormalities ICU telemetry monitoring   Acute Renal  Failure stage 3  - due to decreased effective circulating volume in setting of DKA  - treat underlying cause    - d/c nephrotoxic meds -follow chem 7 -follow UO -continue Foley Catheter-assess need daily  Morbid obesity   - query OSA   - hindering liberation from MV   ID -continue IV abx as prescibed -follow up cultures  GI/Nutrition GI PROPHYLAXIS as indicated DIET-->TF's as tolerated Constipation protocol as indicated  ENDO - ICU hypoglycemic\Hyperglycemia protocol -check FSBS per protocol   ELECTROLYTES -follow labs as needed -replace as needed -pharmacy consultation   DVT/GI PRX ordered -SCDs  TRANSFUSIONS AS NEEDED MONITOR FSBS ASSESS the need for LABS as needed   Critical care provider statement:    Critical care time (minutes):  109   Critical care time was exclusive of:  Separately billable procedures and treating other patients   Critical care was necessary to treat or prevent imminent or life-threatening deterioration of the following conditions:  DKA, encephalopathy, AKI, multiple comoribid conditions   Critical care was time spent personally by me on the following activities:  Development of treatment plan with patient or surrogate, discussions with consultants, evaluation of patient's response to treatment, examination of patient, obtaining history from patient or surrogate, ordering and performing treatments and interventions, ordering and review of laboratory studies and re-evaluation of patient's condition.  I assumed direction of critical care for this patient from another provider in my specialty: no    This document was prepared using Dragon voice recognition software and may include unintentional dictation errors.  Ottie Glazier, M.D.  Division of [facility]

## 2020-01-25 LAB — MAGNESIUM: Magnesium: 1.5 mg/dL — ABNORMAL LOW (ref 1.7–2.4)

## 2020-01-25 LAB — BASIC METABOLIC PANEL
Anion gap: 10 (ref 5–15)
BUN: 41 mg/dL — ABNORMAL HIGH (ref 6–20)
CO2: 30 mmol/L (ref 22–32)
Calcium: 8.1 mg/dL — ABNORMAL LOW (ref 8.9–10.3)
Chloride: 106 mmol/L (ref 98–111)
Creatinine, Ser: 2.17 mg/dL — ABNORMAL HIGH (ref 0.44–1.00)
GFR calc Af Amer: 28 mL/min — ABNORMAL LOW (ref >60)
GFR calc non Af Amer: 24 mL/min — ABNORMAL LOW (ref >60)
Glucose, Bld: 206 mg/dL — ABNORMAL HIGH (ref 70–99)
Potassium: 4 mmol/L (ref 3.5–5.1)
Sodium: 146 mmol/L — ABNORMAL HIGH (ref 135–145)

## 2020-01-25 LAB — GLUCOSE, CAPILLARY
Glucose-Capillary: 185 mg/dL — ABNORMAL HIGH (ref 70–99)
Glucose-Capillary: 186 mg/dL — ABNORMAL HIGH (ref 70–99)
Glucose-Capillary: 201 mg/dL — ABNORMAL HIGH (ref 70–99)
Glucose-Capillary: 215 mg/dL — ABNORMAL HIGH (ref 70–99)
Glucose-Capillary: 224 mg/dL — ABNORMAL HIGH (ref 70–99)
Glucose-Capillary: 262 mg/dL — ABNORMAL HIGH (ref 70–99)
Glucose-Capillary: 294 mg/dL — ABNORMAL HIGH (ref 70–99)

## 2020-01-25 LAB — PHOSPHORUS: Phosphorus: 3.1 mg/dL (ref 2.5–4.6)

## 2020-01-25 LAB — PROCALCITONIN: Procalcitonin: 136.13 ng/mL

## 2020-01-25 MED ORDER — INSULIN ASPART 100 UNIT/ML ~~LOC~~ SOLN: 10.0000 [IU] | Freq: Three times a day (TID) | SUBCUTANEOUS | Status: DC

## 2020-01-25 MED ORDER — MAGNESIUM SULFATE 2 GM/50ML IV SOLN
2.0000 g | Freq: Once | INTRAVENOUS | Status: AC
  Administered 2020-01-25: 2 g via INTRAVENOUS
  Filled 2020-01-25: qty 50

## 2020-01-25 NOTE — Progress Notes (Signed)
PCCM to [facility] transfer:  Patient with PMH of DM who presenting on 8/3 with DKA.  Essentially comatose on arrival so intubated.  Now extubated, doing better.  Needs PT/OT. TOC team is involved due to expensive meds, needs prior authorization assistance since she couldn't afford medications before admission and this led to DKA.  She had a fever, tracheal aspirate was done and culture is pending - ?normal flora.  On empiric antibiotics for PNA.  TRH will assume care on 8/9.   Carlyon Shadow, M.D.

## 2020-01-25 NOTE — Progress Notes (Signed)
[facility] for Electrolyte Monitoring and Replacement   Recent Labs: Potassium (mmol/L)  Date Value  01/25/2020 4.0   Magnesium (mg/dL)  Date Value  01/24/2020 1.7   Calcium (mg/dL)  Date Value  01/25/2020 8.1 (L)   Albumin (g/dL)  Date Value  01/20/2020 4.2   Phosphorus (mg/dL)  Date Value  01/24/2020 1.8 (L)   Sodium (mmol/L)  Date Value  01/25/2020 146 (H)   Corrected Ca: 7.94 mg/dL Add-On magnesium: 1.5 mg/dL Add-On phosphorous: 3.1 mg/dL  Assessment: 59 year old female admitted to the ICU intubated and sedated with DKA on insulin drip which has since resolved and transitioned to subcutaneous insulin. now off bicarbonate drip with resolution of acidosis. Pharmacy to manage electrolytes.  Goal of Therapy:  Electrolytes WNL  Plan:   IV magnesium sulfate 2 grams x 1  Re-check electrolytes with morning labs  Dallie Piles 01/25/2020 11:26 AM

## 2020-01-25 NOTE — Progress Notes (Signed)
CRITICAL CARE PROGRESS NOTE    Name: Jamileth Putzier MRN: [id number] DOB: [birth year]     LOS: 5   SUBJECTIVE FINDINGS & SIGNIFICANT EVENTS    Patient description:  59 yo female admitted with DKA requiring insulin gtt after running out of her insulingtt. Patient comatose.   SIGNIFICANT EVENTS/STUDIES: 08/4: Pt admitted to ICU with DKA 8/5 severe DKA and unresponsive state.  8/6- DKA regimen reviewed with Diabetic coordinator and adjusted, patient remains unresponsive despite no sedation today.  01/24/20 - Patient medically optimized with resolution of DKA , s/p liberation from Mechanical intubation. Care plan reviewed with son at bedside.  01/25/20- patient is speaking and communicating appropriately with resolution of DKA. No events overnight post extubation. Signed out to [facility] - Dr Lorin Mercy.   Lines/tubes : Airway 7.5 mm (Active)  Secured at (cm) 23 cm 01/23/20 0655  Measured From Lips 01/23/20 0655  Secured Location Right 01/23/20 0655  Secured By [employer] 01/23/20 0655  Tube Holder Repositioned Yes 01/23/20 0655  Cuff Pressure (cm H2O) 28 cm H2O 01/23/20 0655  Site Condition Cool;Dry 01/23/20 0655     NG/OG Tube Nasogastric Right nare Xray 60 cm (Active)  Cm Marking at Nare/Corner of Mouth (if applicable) 58 cm 44/31/54 1945  External Length of Tube (cm) - (if applicable) 59 cm 00/86/76 1930  Site Assessment Clean;Dry;Intact 01/22/20 1930  Ongoing Placement Verification No change in cm markings or external length of tube from initial placement 01/22/20 1930  Status Infusing tube feed 01/22/20 1930  Drainage Appearance None 01/22/20 1930  Intake (mL) 60 mL 01/22/20 1720  Output (mL) 0 mL 01/21/20 1537     Urethral Catheter (Active)  Indication for Insertion or Continuance of Catheter  Unstable critically ill patients first 24-48 hours (See Criteria) 01/22/20 1600  Site Assessment Clean;Intact;Dry 01/22/20 2000  Catheter Maintenance Bag below level of bladder;Catheter secured;Drainage bag/tubing not touching floor;Insertion date on drainage bag;No dependent loops;Seal intact;Bag emptied prior to transport 01/22/20 2000  Collection Container Standard drainage bag 01/22/20 2000  Securement Method Securing device (Describe) 01/22/20 2000  Urinary Catheter Interventions (if applicable) Unclamped 61/50/93 2000  Output (mL) 1650 mL 01/23/20 0615    Microbiology/Sepsis markers: Results for orders placed or performed during the hospital encounter of 01/20/20  SARS Coronavirus 2 by RT PCR (hospital order, performed in [facility] hospital lab) Nasopharyngeal Nasopharyngeal Swab     Status: None   Collection Time: 01/21/20 12:45 AM   Specimen: Nasopharyngeal Swab  Result Value Ref Range Status   SARS Coronavirus 2 NEGATIVE NEGATIVE Final    Comment: (NOTE) SARS-CoV-2 target nucleic acids are NOT DETECTED.  The SARS-CoV-2 RNA is generally detectable in upper and lower respiratory specimens during the acute phase of infection. The lowest concentration of SARS-CoV-2 viral copies this assay can detect is 250 copies / mL. A negative result does not preclude SARS-CoV-2 infection and should not be used as the sole basis for treatment or other patient management decisions.  A negative result may occur with improper specimen collection / handling, submission of specimen other than nasopharyngeal swab, presence of viral mutation(s) within the areas targeted by this assay, and inadequate number of viral copies (<250 copies / mL). A negative result must be combined with clinical observations, patient history, and epidemiological information.  Fact Sheet for Patients:   StrictlyIdeas.no  Fact Sheet for Healthcare  Providers: BankingDealers.co.za  This test is not yet approved or  cleared by the [place] FDA and has been authorized  for detection and/or diagnosis of SARS-CoV-2 by FDA under an Emergency Use Authorization (EUA).  This EUA will remain in effect (meaning this test can be used) for the duration of the COVID-19 declaration under Section 564(b)(1) of the Act, 21 U.S.C. section 360bbb-3(b)(1), unless the authorization is terminated or revoked sooner.  Performed at [facility], [address] [place], [place] [address]   CULTURE, BLOOD (ROUTINE X 2) w Reflex to ID Panel     Status: None (Preliminary result)   Collection Time: 01/21/20  3:24 AM   Specimen: BLOOD RIGHT FOREARM  Result Value Ref Range Status   Specimen Description BLOOD RIGHT FOREARM  Final   Special Requests   Final    BOTTLES DRAWN AEROBIC AND ANAEROBIC Blood Culture adequate volume   Culture   Final    NO GROWTH 4 DAYS Performed at [facility], [address], [place], [place] [address]    Report Status PENDING  Incomplete  CULTURE, BLOOD (ROUTINE X 2) w Reflex to ID Panel     Status: None (Preliminary result)   Collection Time: 01/21/20  3:24 AM   Specimen: BLOOD  Result Value Ref Range Status   Specimen Description BLOOD LEFT ANTECUBITAL  Final   Special Requests   Final    BOTTLES DRAWN AEROBIC AND ANAEROBIC Blood Culture adequate volume   Culture   Final    NO GROWTH 4 DAYS Performed at [facility] [address], [place], [place] [address]    Report Status PENDING  Incomplete  Urine Culture     Status: None   Collection Time: 01/21/20  5:06 AM   Specimen: Urine, Random  Result Value Ref Range Status   Specimen Description   Final    URINE, RANDOM Performed at [facility], [address] [place], [place] [address]    Special Requests   Final    NONE Performed at [facility], [address] [place], [place] [address]     Culture   Final    NO GROWTH Performed at [facility], [place] [address] [place], [place] [address]    Report Status 01/22/2020 FINAL  Final  Culture, respiratory (non-expectorated)     Status: None (Preliminary result)   Collection Time: 01/23/20  6:00 PM   Specimen: Tracheal Aspirate; Respiratory  Result Value Ref Range Status   Specimen Description   Final    TRACHEAL ASPIRATE Performed at [facility], [address] [place], [place] [address]    Special Requests   Final    NONE Performed at [facility], [address] [place], [place] [address]    Gram Stain   Final    FEW WBC PRESENT, PREDOMINANTLY PMN MODERATE GRAM NEGATIVE RODS FEW GRAM POSITIVE COCCI RARE GRAM POSITIVE RODS    Culture   Final    CULTURE REINCUBATED FOR BETTER GROWTH Performed at [facility], [place] [address], [place], [place] [address]    Report Status PENDING  Incomplete  Culture, blood (routine x 2)     Status: None (Preliminary result)   Collection Time: 01/23/20  6:45 PM   Specimen: BLOOD  Result Value Ref Range Status   Specimen Description BLOOD BLOOD LEFT HAND  Final   Special Requests   Final    BOTTLES DRAWN AEROBIC AND ANAEROBIC Blood Culture adequate volume   Culture   Final    NO GROWTH 2 DAYS Performed at [facility], [address] [place], [place] [address]    Report Status PENDING  Incomplete  Culture, blood (routine x 2)     Status: None (Preliminary result)   Collection Time: 01/23/20  7:56 PM   Specimen: BLOOD  Result Value Ref Range Status   Specimen Description BLOOD BLOOD RIGHT HAND  Final   Special Requests   Final    BOTTLES DRAWN AEROBIC AND ANAEROBIC Blood Culture results may not be optimal due to an excessive volume of blood received in culture bottles   Culture   Final    NO GROWTH 2 DAYS Performed at [facility], [address] [place], [place] [address]    Report Status PENDING  Incomplete     Anti-infectives:  Anti-infectives (From admission, onward)   Start     Dose/Rate Route Frequency Ordered Stop   01/25/20 0600  vancomycin (VANCOCIN) IVPB 1000 mg/200 mL premix     Discontinue     1,000 mg 200 mL/hr over 60 Minutes Intravenous Every 48 hours 01/23/20 1850     01/23/20 2000  piperacillin-tazobactam (ZOSYN) IVPB 3.375 g     Discontinue     3.375 g 12.5 mL/hr over 240 Minutes Intravenous Every 8 hours 01/23/20 1816     01/23/20 2000  vancomycin (VANCOREADY) IVPB 1500 mg/300 mL        1,500 mg 150 mL/hr over 120 Minutes Intravenous  Once 01/23/20 1816 01/23/20 2230   01/22/20 0800  ceFEPIme (MAXIPIME) 2 g in sodium chloride 0.9 % 100 mL IVPB  Status:  Discontinued        2 g 200 mL/hr over 30 Minutes Intravenous Every 24 hours 01/21/20 1541 01/22/20 1032   01/21/20 0700  ceFEPIme (MAXIPIME) 2 g in sodium chloride 0.9 % 100 mL IVPB  Status:  Discontinued        2 g 200 mL/hr over 30 Minutes Intravenous Every 12 hours 01/21/20 0607 01/21/20 1541       PAST MEDICAL HISTORY   No past medical history on file.   SURGICAL HISTORY   None    FAMILY HISTORY   No family history on file.   SOCIAL HISTORY   Social History   Tobacco Use  . Smoking status: Not on file  Substance Use Topics  . Alcohol use: Not on file  . Drug use: Not on file     MEDICATIONS   Current Medication:  Current Facility-Administered Medications:  .  0.9 %  sodium chloride infusion, 250 mL, Intravenous, Continuous, Blakeney, Dreama Saa, NP, Stopped at 01/22/20 1720 .  acetaminophen (TYLENOL) tablet 650 mg, 650 mg, Oral, Q4H PRN, Ottie Glazier, MD, 650 mg at 01/24/20 1038 .  chlorhexidine gluconate (MEDLINE KIT) (PERIDEX) 0.12 % solution 15 mL, 15 mL, Mouth Rinse, BID, Kasa, Kurian, MD, 15 mL at 01/25/20 0800 .  Chlorhexidine Gluconate Cloth 2 % PADS 6 each, 6 each, Topical, Daily, Flora Lipps, MD, 6 each at 01/24/20 [phone] .  citalopram (CELEXA) tablet 20 mg, 20 mg, Per Tube, Daily,  Kasa, Kurian, MD, 20 mg at 01/25/20 0840 .  dextrose 5 %-0.45 % sodium chloride infusion, , Intravenous, Continuous, Tukov-Yual, Magdalene S, NP, Last Rate: 50 mL/hr at 01/25/20 0600, Rate Verify at 01/25/20 0600 .  docusate (COLACE) 50 MG/5ML liquid 100 mg, 100 mg, Oral, BID, Awilda Bill, NP, 100 mg at 01/25/20 0841 .  feeding supplement (VITAL AF 1.2 CAL) liquid 1,000 mL, 1,000 mL, Per Tube, Continuous, Flora Lipps, MD, Paused at 01/23/20 1400 .  fentaNYL (SUBLIMAZE) bolus via infusion 50 mcg, 50 mcg, Intravenous, Q15 min PRN, Awilda Bill, NP, 50 mcg at 01/22/20 0914 .  fentaNYL (SUBLIMAZE) injection 50 mcg, 50 mcg, Intravenous, Once, Blakeney, Dana G, NP .  fentaNYL 257mg in NS 2562m(1020mml) infusion-PREMIX, 0-400 mcg/hr, Intravenous, Continuous, Kasa, Kurian, MD, Last Rate: 25 mL/hr at 01/23/20 0244, 250 mcg/hr at 01/23/20 0244 .  heparin injection 5,000 Units, 5,000 Units, Subcutaneous, Q8H, BlaAwilda BillP, 5,000 Units at 01/25/20 0529 .  insulin aspart (novoLOG) injection 0-15 Units, 0-15 Units, Subcutaneous, Q4H, AleOttie GlazierD, 3 Units at 01/25/20 [phone] insulin glargine (LANTUS) injection 10 Units, 10 Units, Subcutaneous, Daily, Tukov-Yual, Magdalene S, NP .  LORazepam (ATIVAN) tablet 1 mg, 1 mg, Per Tube, BID, KasFlora LippsD, 1 mg at 01/25/20 0840 .  MEDLINE mouth rinse, 15 mL, Mouth Rinse, 10 times per day, KasFlora LippsD, 15 mL at 01/25/20 0610 .  midazolam (VERSED) 50 mg/50 mL (1 mg/mL) premix infusion, 0.5-10 mg/hr, Intravenous, Continuous, Kasa, Kurian, MD .  midazolam (VERSED) injection 2 mg, 2 mg, Intravenous, Q2H PRN, BlaAwilda BillP, 2 mg at 01/24/20 0721 .  midazolam (VERSED) injection 2 mg, 2 mg, Intravenous, Once, Tukov-Yual, Magdalene S, NP .  ondansetron (ZOFRAN) injection 4 mg, 4 mg, Intravenous, Q6H PRN, BlaAwilda BillP, 4 mg at 01/21/20 0021 .  pantoprazole sodium (PROTONIX) 40 mg/20 mL oral suspension 40 mg, 40 mg, Per Tube, Daily,  KasFlora LippsD, 40 mg at 01/25/20 0841 .  piperacillin-tazobactam (ZOSYN) IVPB 3.375 g, 3.375 g, Intravenous, Q8H, GruDallie PilesPH, Last Rate: 12.5 mL/hr at 01/25/20 0644, 3.375 g at 01/25/20 0644 .  polyethylene glycol (MIRALAX / GLYCOLAX) packet 17 g, 17 g, Oral, Daily, BlaAwilda BillP, 17 g at 01/25/20 0841 .  sodium chloride flush (NS) 0.9 % injection 10-40 mL, 10-40 mL, Intracatheter, Q12H, Kasa, Kurian, MD, 10 mL at 01/24/20 2154 .  sodium chloride flush (NS) 0.9 % injection 10-40 mL, 10-40 mL, Intracatheter, PRN, KasFlora LippsD .  vancomycin (VANCOCIN) IVPB 1000 mg/200 mL premix, 1,000 mg, Intravenous, Q48H, GruDallie PilesPH, Last Rate: 200 mL/hr at 01/25/20 0526, 1,000 mg at 01/25/20 0526    ALLERGIES   Patient has no known allergies.    REVIEW OF SYSTEMS     Unable to Obtain due to  GCS-7T  PHYSICAL EXAMINATION   Vital Signs: Temp:  [97.7 F (36.5 C)-101 F (38.3 C)] 97.9 F (36.6 C) (08/08 0800) Pulse Rate:  [86-125] 103 (08/08 0800) Resp:  [11-25] 20 (08/08 0800) BP: (91-136)/(53-104) 115/62 (08/08 0800) SpO2:  [97 %-100 %] 100 % (08/08 0800) FiO2 (%):  [30 %-60 %] 30 % (08/07 1425) Weight:  [92.5 kg] 92.5 kg (08/08 0500)  GENERAL:Age appropirate HEAD: Normocephalic, atraumatic.  EYES: Pupils equal, round, reactive to light.  No scleral icterus.  MOUTH: Moist mucosal membrane. NECK: Supple. No thyromegaly. No nodules. No JVD.  PULMONARY: mildly rhonchorous breath sounds CARDIOVASCULAR: S1 and S2. Regular rate and rhythm. No murmurs, rubs, or gallops.  GASTROINTESTINAL: Soft, nontender,obese,  non-distended. No masses. Positive bowel sounds. No hepatosplenomegaly.  MUSCULOSKELETAL: No swelling, clubbing, or edema.  NEUROLOGIC: GCS7T SKIN:intact,warm,dry   PERTINENT DATA     Infusions: . sodium chloride Stopped (01/22/20 1720)  . dextrose 5 % and 0.45% NaCl 50 mL/hr at 01/25/20 0600  . feeding supplement (VITAL AF 1.2 CAL) Stopped  (01/23/20 1400)  . fentaNYL infusion INTRAVENOUS 250 mcg/hr (01/23/20 0244)  .  midazolam    . piperacillin-tazobactam (ZOSYN)  IV 3.375 g (01/25/20 0644)  . vancomycin 1,000 mg (01/25/20 0526)   Scheduled Medications: . chlorhexidine gluconate (MEDLINE KIT)  15 mL Mouth Rinse BID  . Chlorhexidine Gluconate Cloth  6 each Topical Daily  . citalopram  20 mg Per Tube Daily  . docusate  100 mg Oral BID  . fentaNYL (SUBLIMAZE) injection  50 mcg Intravenous Once  . heparin  5,000 Units Subcutaneous Q8H  . insulin aspart  0-15 Units Subcutaneous Q4H  . insulin glargine  10 Units Subcutaneous Daily  . LORazepam  1 mg Per Tube BID  . mouth rinse  15 mL Mouth Rinse 10 times per day  . midazolam  2 mg Intravenous Once  . pantoprazole sodium  40 mg Per Tube Daily  . polyethylene glycol  17 g Oral Daily  . sodium chloride flush  10-40 mL Intracatheter Q12H   PRN Medications: acetaminophen, fentaNYL, midazolam, ondansetron (ZOFRAN) IV, sodium chloride flush Hemodynamic parameters:   Intake/Output: 08/07 0701 - 08/08 0700 In: 2211.2 [I.V.:1829.1; NG/GT:200; IV Piggyback:182.2] Out: 287 [Urine:650]  Ventilator  Settings: Vent Mode: PSV;CPAP FiO2 (%):  [30 %-60 %] 30 % Set Rate:  [18 bmp] 18 bmp Vt Set:  [500 mL] 500 mL PEEP:  [5 cmH20-10 cmH20] 5 cmH20 Pressure Support:  [5 cmH20] 5 cmH20 Plateau Pressure:  [22 cmH20] 22 cmH20   LAB RESULTS:  Basic Metabolic Panel: Recent Labs  Lab 01/21/20 0325 01/21/20 0740 01/22/20 0002 01/22/20 0415 01/23/20 0323 01/23/20 0717 01/23/20 2355 01/23/20 2355 01/24/20 0704 01/24/20 0704 01/24/20 1140 01/24/20 1140 01/24/20 1555 01/24/20 2017  NA  --    < > 137   < > 138   < > 146*  --  146*  --  148*  --  144 144  K  --    < > 3.5   < > 4.2   < > 3.2*   < > 3.9   < > 3.9   < > 4.4 4.4  CL  --    < > 100   < > 102   < > 107  --  107  --  106  --  106 104  CO2  --    < > 22   < > 26   < > 26  --  26  --  26  --  22 26  GLUCOSE  --    < >  312*   < > 330*   < > 221*  --  141*  --  153*  --  305* 378*  BUN  --    < > 45*   < > 37*   < > 34*  --  32*  --  34*  --  35* 38*  CREATININE  --    < > 3.35*   < > 2.83*   < > 2.53*  --  2.56*  --  2.36*  --  2.45* 2.38*  CALCIUM  --    < > 8.3*   < > 7.6*   < > 8.1*  --  8.3*  --  8.3*  --  8.1* 7.8*  MG 2.8*  --  2.2  --  1.8  --   --   --  1.7  --   --   --   --   --   PHOS 6.0*  --  <1.0*  --  <1.0*  --   --   --  1.8*  --   --   --   --   --    < > = values in this interval not displayed.   Liver Function Tests: Recent Labs  Lab 01/20/20 2216  AST 26  ALT 23  ALKPHOS 89  BILITOT 2.5*  PROT 7.6  ALBUMIN 4.2   No results for input(s): LIPASE, AMYLASE in the last 168 hours. No results for input(s): AMMONIA in the last 168 hours. CBC: Recent Labs  Lab 01/20/20 2216 01/21/20 0325 01/22/20 0415 01/23/20 0323  WBC 15.1* 13.6* 5.1 4.5  HGB 12.9 13.4 9.8* 9.8*  HCT 40.1 40.6 26.5* 27.0*  MCV 99.5 96.9 89.5 90.0  PLT 231 169 89* 68*   Cardiac Enzymes: No results for input(s): CKTOTAL, CKMB, CKMBINDEX, TROPONINI in the last 168 hours. BNP: Invalid input(s): POCBNP CBG: Recent Labs  Lab 01/24/20 1259 01/24/20 1551 01/25/20 0014 01/25/20 0343 01/25/20 0729  GLUCAP 201* 305* 294* 201* 185*       IMAGING RESULTS:  Imaging: DG Abd 1 View  Result Date: 01/23/2020 CLINICAL DATA:  Abdominal distension. EXAM: ABDOMEN - 1 VIEW COMPARISON:  None. FINDINGS: A nasogastric tube is seen with its distal tip overlying the body of the stomach. The bowel gas pattern is normal. Radiopaque surgical clips are seen overlying the right upper quadrant. No radio-opaque calculi or other significant radiographic abnormality are seen. IMPRESSION: No evidence of bowel obstruction or ileus. Electronically Signed   By: Virgina Norfolk M.D.   On: 01/23/2020 21:38   DG Chest Port 1 View  Result Date: 01/24/2020 CLINICAL DATA:  Acute respiratory failure. EXAM: PORTABLE CHEST 1 VIEW  COMPARISON:  01/23/2020 and 01/20/2020 FINDINGS: Endotracheal tube is 5.1 cm above the carina. Nasogastric tube extends into the abdomen but the tip is beyond the image. Increased densities at the right lung base. Focal rounded or nodular density in the mid left lung is more conspicuous. Heart size is stable. Negative for a pneumothorax. IMPRESSION: 1. Increasing densities at the right lung base. Findings are suggestive for right pleural fluid and likely airspace disease/consolidation. 2. Focal density in the mid left lung has become more conspicuous over the recent chest radiographs. Suspect this is a small focus of airspace disease. Recommend attention on follow-up imaging. Electronically Signed   By: Markus Daft M.D.   On: 01/24/2020 08:43   DG Chest Port 1 View  Result Date: 01/23/2020 CLINICAL DATA:  Fever and chills EXAM: PORTABLE CHEST 1 VIEW COMPARISON:  01/21/2020 FINDINGS: Endotracheal tube tip is about 3.4 cm superior to the carina. Esophageal tube tip is below the diaphragm but incompletely visualized. Small bilateral effusions. Mild cardiomegaly with central vascular congestion. Hazy atelectasis at the lung bases. IMPRESSION: Small bilateral pleural effusions with hazy atelectasis at the lung bases. Mild cardiomegaly with central vascular congestion. Electronically Signed   By: Donavan Foil M.D.   On: 01/23/2020 19:41   @PROBHOSP @ No results found.   ASSESSMENT AND PLAN    -Multidisciplinary rounds held today  Acute hypoxemic respiratory failure  - due to DKA with encephalopathy  - patient s/p intubation - weaning FiO2  -will continue to stay off of sedation to plan for SBT and liberation from MV - stop OGT nourishment in am   Diabetic Keto Acidosis-resolved  - DKA protocol phase 1-3  - discussed with coordinator and RN  - insulin gtt modified   -urine and blood cultures negative thus far.    Altered mental status with encephalopathy-resolved  - due  to AKI with recirculation  of sedatives and DKA related encephalopathy and metabolic encephalopathy due electrolyte derrangements and Uremic encephalopathy due to AKI   - will correct electrolytes, improve AKI, d/c centrally acting RX, treat DKA -CTH negative for acute abnormalities ICU telemetry monitoring -Foster regimen adjusted with 10 lantus daily, 10 novolog tid ac, plus ISS  Acute Renal Failure stage 3  - due to decreased effective circulating volume in setting of DKA  - treat underlying cause    - d/c nephrotoxic meds -follow chem 7 -follow UO -continue Foley Catheter-assess need daily  Morbid obesity   - query OSA   - hindering liberation from MV   ID -continue IV abx as prescibed -follow up cultures  GI/Nutrition GI PROPHYLAXIS as indicated DIET-->TF's as tolerated Constipation protocol as indicated  ENDO - ICU hypoglycemic\Hyperglycemia protocol -check FSBS per protocol   ELECTROLYTES -follow labs as needed -replace as needed -pharmacy consultation   DVT/GI PRX ordered -SCDs  TRANSFUSIONS AS NEEDED MONITOR FSBS ASSESS the need for LABS as needed   Critical care provider statement:    Critical care time (minutes):  33   Critical care time was exclusive of:  Separately billable procedures and treating other patients   Critical care was necessary to treat or prevent imminent or life-threatening deterioration of the following conditions:  DKA, encephalopathy, AKI, multiple comoribid conditions   Critical care was time spent personally by me on the following activities:  Development of treatment plan with patient or surrogate, discussions with consultants, evaluation of patient's response to treatment, examination of patient, obtaining history from patient or surrogate, ordering and performing treatments and interventions, ordering and review of laboratory studies and re-evaluation of patient's condition.  I assumed direction of critical care for this patient from another provider in my  specialty: no    This document was prepared using Dragon voice recognition software and may include unintentional dictation errors.    Ottie Glazier, M.D.  Division of [facility]

## 2020-01-25 NOTE — TOC Progression Note (Signed)
Transition of Care [facility]) Benefit Eligibility Note    Patient Details  Name: Natalie [name] MRN: [id number] Date of Birth: [birth year]  CSW contacted patient's insurance, some medications now require pre-authorization before dispensing, Jardiance and Trulicity both require pre-authorization, I have printed off the forms with instructions and the fax # is in the form to send once complete. Once authorized Pt's copay is never more than $3 per perscription.                               Meriel Flavors, LCSW Phone Number: 01/25/2020, 8:59 AM

## 2020-01-26 ENCOUNTER — Inpatient Hospital Stay: Payer: Medicaid Other

## 2020-01-26 ENCOUNTER — Other Ambulatory Visit: Payer: Self-pay

## 2020-01-26 ENCOUNTER — Encounter: Payer: Self-pay | Admitting: Internal Medicine

## 2020-01-26 DIAGNOSIS — E0811 Diabetes mellitus due to underlying condition with ketoacidosis with coma: Secondary | ICD-10-CM

## 2020-01-26 LAB — RENAL FUNCTION PANEL
Albumin: 2.6 g/dL — ABNORMAL LOW (ref 3.5–5.0)
Anion gap: 7 (ref 5–15)
BUN: 31 mg/dL — ABNORMAL HIGH (ref 6–20)
CO2: 32 mmol/L (ref 22–32)
Calcium: 8.5 mg/dL — ABNORMAL LOW (ref 8.9–10.3)
Chloride: 111 mmol/L (ref 98–111)
Creatinine, Ser: 1.42 mg/dL — ABNORMAL HIGH (ref 0.44–1.00)
GFR calc Af Amer: 46 mL/min — ABNORMAL LOW (ref >60)
GFR calc non Af Amer: 40 mL/min — ABNORMAL LOW (ref >60)
Glucose, Bld: 161 mg/dL — ABNORMAL HIGH (ref 70–99)
Phosphorus: 3.2 mg/dL (ref 2.5–4.6)
Potassium: 3.5 mmol/L (ref 3.5–5.1)
Sodium: 150 mmol/L — ABNORMAL HIGH (ref 135–145)

## 2020-01-26 LAB — CULTURE, BLOOD (ROUTINE X 2)
Culture: NO GROWTH
Culture: NO GROWTH
Special Requests: ADEQUATE
Special Requests: ADEQUATE

## 2020-01-26 LAB — CULTURE, RESPIRATORY W GRAM STAIN: Culture: NORMAL

## 2020-01-26 LAB — CBC
HCT: 27.4 % — ABNORMAL LOW (ref 36.0–46.0)
Hemoglobin: 8.9 g/dL — ABNORMAL LOW (ref 12.0–15.0)
MCH: 31.7 pg (ref 26.0–34.0)
MCHC: 32.5 g/dL (ref 30.0–36.0)
MCV: 97.5 fL (ref 80.0–100.0)
Platelets: 187 10*3/uL (ref 150–400)
RBC: 2.81 MIL/uL — ABNORMAL LOW (ref 3.87–5.11)
RDW: 15.6 % — ABNORMAL HIGH (ref 11.5–15.5)
WBC: 5.2 10*3/uL (ref 4.0–10.5)
nRBC: 0 % (ref 0.0–0.2)

## 2020-01-26 LAB — GLUCOSE, CAPILLARY
Glucose-Capillary: 122 mg/dL — ABNORMAL HIGH (ref 70–99)
Glucose-Capillary: 122 mg/dL — ABNORMAL HIGH (ref 70–99)
Glucose-Capillary: 368 mg/dL — ABNORMAL HIGH (ref 70–99)
Glucose-Capillary: 136 mg/dL — ABNORMAL HIGH (ref 70–99)
Glucose-Capillary: 144 mg/dL — ABNORMAL HIGH (ref 70–99)
Glucose-Capillary: 179 mg/dL — ABNORMAL HIGH (ref 70–99)
Glucose-Capillary: 190 mg/dL — ABNORMAL HIGH (ref 70–99)
Glucose-Capillary: 241 mg/dL — ABNORMAL HIGH (ref 70–99)

## 2020-01-26 LAB — MAGNESIUM: Magnesium: 2.3 mg/dL (ref 1.7–2.4)

## 2020-01-26 LAB — MRSA PCR SCREENING: MRSA by PCR: NEGATIVE

## 2020-01-26 IMAGING — DX DG CHEST 1V PORT
1 series · 1 of 1 positions shown · non-contrast
Comparison: [DATE].

CLINICAL DATA: Acute respiratory failure.

EXAM:
PORTABLE CHEST 1 VIEW

[chest pa]
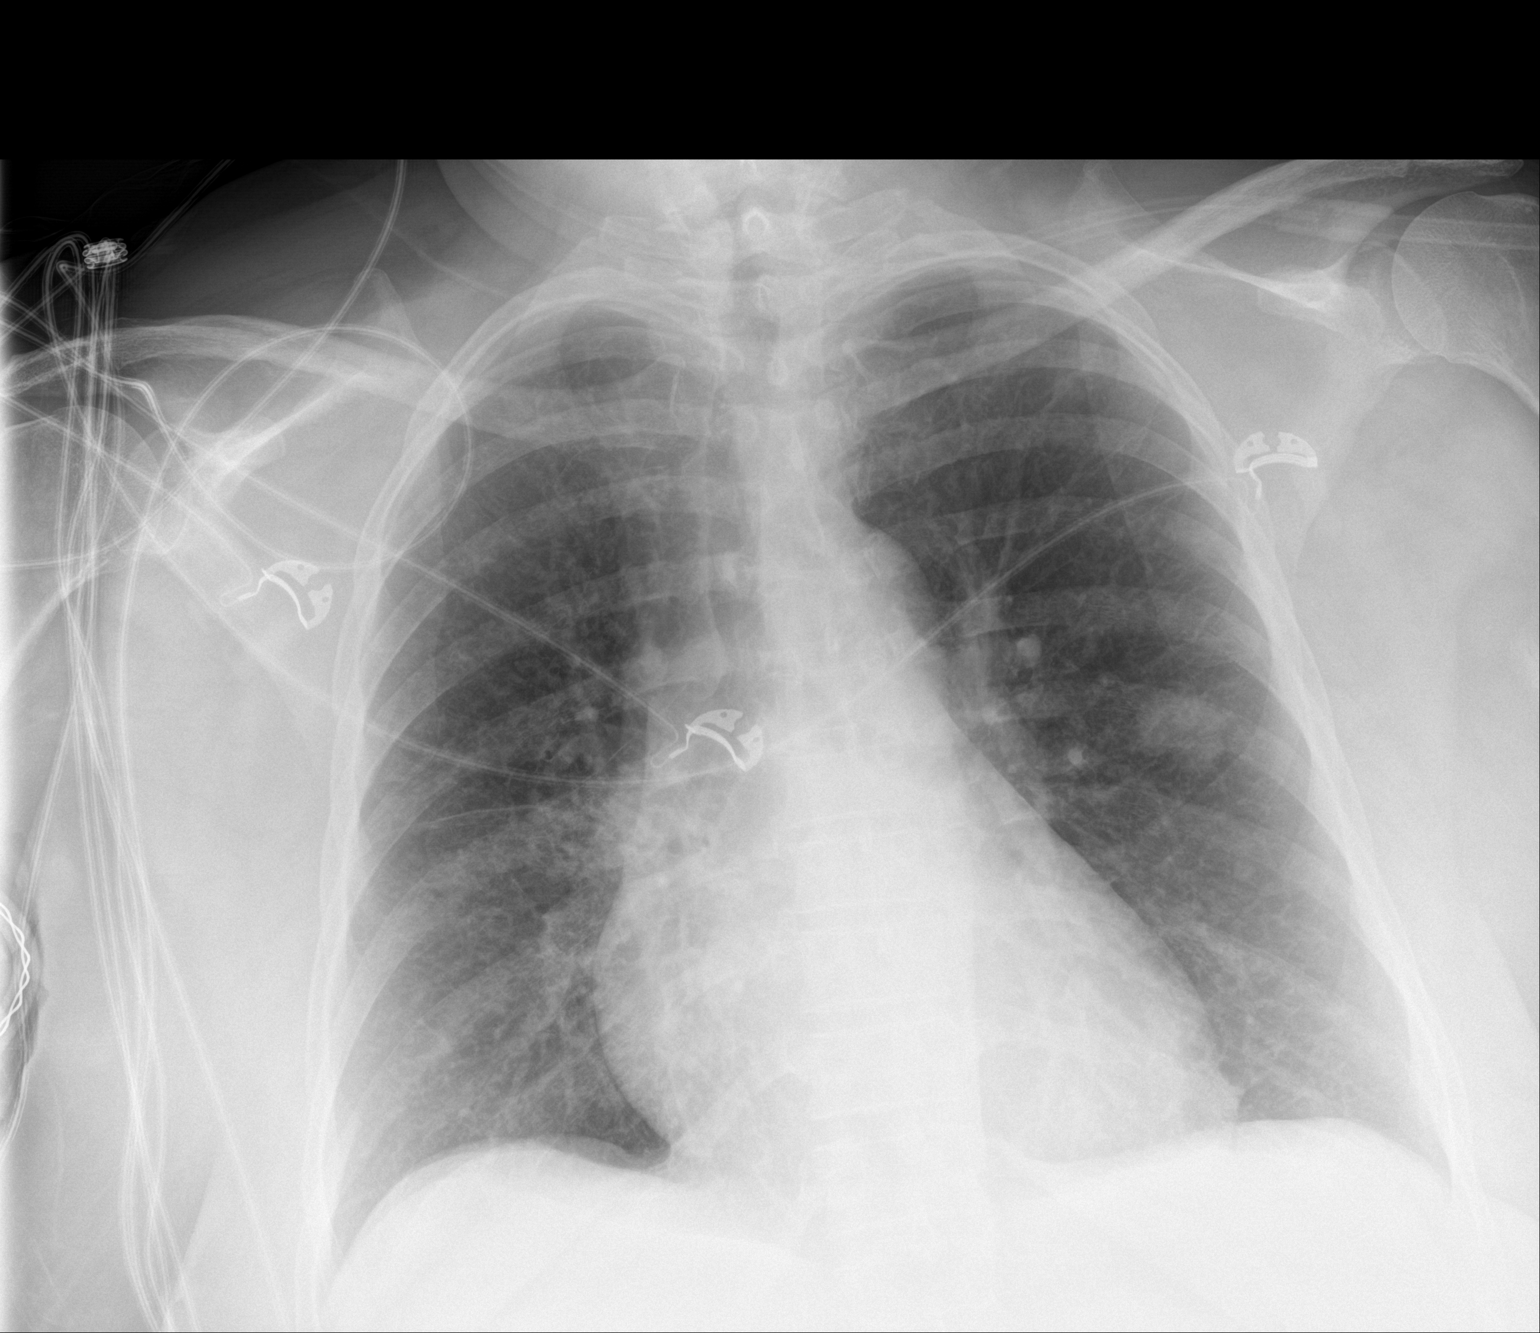

[1 of 1 positions shown; findings below may reference images not displayed]

FINDINGS: Mild cardiac enlargement. No pleural effusion or edema. Diffuse,
chronic interstitial coarsening noted bilaterally. Left midlung
nodular density is persistent when compared with previous exam.
IMPRESSION: 1. Persistent left midlung nodular density. Further investigation
with contrast enhanced CT of the chest is advised to assess for
underlying malignancy.
2. No new findings.

## 2020-01-26 MED ORDER — PANTOPRAZOLE SODIUM 40 MG IV SOLR
40.0000 mg | INTRAVENOUS | Status: DC
  Administered 2020-01-26 – 2020-01-27 (×2): 40 mg via INTRAVENOUS
  Filled 2020-01-26 (×2): qty 40

## 2020-01-26 MED ORDER — INSULIN GLARGINE 100 UNIT/ML ~~LOC~~ SOLN
10.0000 [IU] | Freq: Two times a day (BID) | SUBCUTANEOUS | Status: DC
  Administered 2020-01-26 – 2020-01-27 (×2): 10 [IU] via SUBCUTANEOUS
  Filled 2020-01-26 (×4): qty 0.1

## 2020-01-26 NOTE — Progress Notes (Signed)
[place] for Electrolyte Monitoring and Replacement   Recent Labs: Potassium (mmol/L)  Date Value  01/26/2020 3.5   Magnesium (mg/dL)  Date Value  01/26/2020 2.3   Calcium (mg/dL)  Date Value  01/26/2020 8.5 (L)   Albumin (g/dL)  Date Value  01/26/2020 2.6 (L)   Phosphorus (mg/dL)  Date Value  01/26/2020 3.2   Sodium (mmol/L)  Date Value  01/26/2020 150 (H)   Corrected Ca: 9.6 mg/dL  Assessment: 59 year old female admitted to the ICU intubated and sedated with DKA on insulin drip which has since resolved and transitioned to subcutaneous insulin. now off bicarbonate drip with resolution of acidosis. Pharmacy to manage electrolytes.  Goal of Therapy:  Electrolytes WNL  Plan:   No electrolyte replacement warranted today  Re-check electrolytes with morning labs  Natalie [name] 01/26/2020 7:51 AM

## 2020-01-26 NOTE — Progress Notes (Addendum)
Inpatient Diabetes Program Recommendations  AACE/ADA: New Consensus Statement on Inpatient Glycemic Control   Target Ranges:  Prepandial:   less than 140 mg/dL      Peak postprandial:   less than 180 mg/dL (1-2 hours)      Critically ill patients:  140 - 180 mg/dL   Results for Natalie [name], Natalie [name] (MRN [id number]) as of 01/26/2020 08:44  Ref. Range 01/25/2020 11:32 01/25/2020 15:59 01/25/2020 20:10 01/25/2020 23:23 01/26/2020 03:44 01/26/2020 07:15  Glucose-Capillary Latest Ref Range: 70 - 99 mg/dL 186 (H) 224 (H) 262 (H) 215 (H) 136 (H) 144 (H)  Results for Natalie [name], Natalie [name] (MRN [id number]) as of 01/26/2020 08:44  Ref. Range 01/21/2020 03:23  Hemoglobin A1C Latest Ref Range: 4.8 - 5.6 % 11.7 (H)   Review of Glycemic Control  Diabetes history:DM2 Outpatient Diabetes medications:Trulicity 1.5 mg Qweek, Jardiance 25 mg daily, Metformin 500 mg BID(out of DM medications since May) Current orders for Inpatient glycemic control: Lantus 10 units daily, Novolog 0-15 units Q4H  Inpatient Diabetes Program Recommendations:    Insulin: Please consider increasing Lantus to 10 units BID.  HbgA1C:  A1C 11.7% on 01/21/20 indicating an average glucose of 289 mg/dl over the past 2-3 months.  Addendum 01/26/20@12 :15-Spoke with patient regarding DM medication regimen and control. Patient confirms that she has [place] Medicaid and that she was taking Trulicity 1.5 mg Qweek, Jardiance 25 mg daily, and Metformin 500 mg BID for DM control. Patient states that she ran out of DM medications in May and that she recently was able to establish care with [facility] and they refilled her DM medications. However, when her son went to pick up the medications, it was over $5000 so he was not able to pick them up. TOC team has been following patient and noted that S. Hardin Negus, LCSW made a note on 01/25/20 indicating that Jardiance and Trulicity both require pre-authorization. Informed patient that per notes in the chart, Trulicity and  Jardiance require a pre-authorization. Patient was not aware of that and states that no one has said anything about that to her that she can recall. Patient states that she has never taken insulin in the past. Discussed that we may need to use other DM medication (short term) if Trulicity and Jardiance pre-authorizations take long to get approved and covered. Informed patient that I would reach out to [facility] team to see if they have any updates regarding pre-authorization for Trulicity and Jardiance. Patient states that she has testing supplies for glucose monitoring at home. Discussed A1C of 11.7% on 01/21/20 indicating an average glucose of 289 mg/dl (note patient has been out of DM medications since May). Reviewed glucose and A1C goals. Patient verbalized understanding of information discussed and states that she has no questions at this time related to DM.  Thanks, Barnie Alderman, RN, MSN, CDE Diabetes Coordinator Inpatient Diabetes Program [phone] (Team Pager from 8am to 5pm)

## 2020-01-26 NOTE — Progress Notes (Signed)
PROGRESS NOTE    Natalie [name]  TDD:[id number] DOB: [birth year] DOA: 01/20/2020 PCP: [facility]    Brief Narrative:  59 year old female with history of type 2 diabetes apparently on Trulicity and Jardiance, recently not taking medicine because they were too expensive presented to the emergency room on 8/3 with unresponsiveness and hypoglycemia.  Patient recently moved from [place], no insurance here and has been out of insulin for some time.  EMS found her with saturation 70% requiring nonrebreather mask, blood glucose 570.  Patient was encephalopathic with pinpoint pupils.  In the ER, she received dose of Narcan with improvement in mentation.  Insulin GTT started and admitted to ICU.  08/4: Pt admitted to ICU with DKA 8/5 severe DKA and unresponsive state.  Intubated. 8/6- DKA regimen reviewed with Diabetic coordinator and adjusted, patient remains unresponsive despite no sedation. 01/24/20 - Patient medically optimized with resolution of DKA , s/p liberation from Mechanical intubation. Care plan reviewed with son at bedside.  01/25/20-extubated.  Transfer to triad care.  Assessment & Plan:   Active Problems:   DKA (diabetic ketoacidoses) ([place])  Diabetic ketoacidosis: Due to noncompliance to insulin use because of unavailability.  Ran out of medicine since May.  Hemoglobin A1c 11.7. Treated with insulin drip with improvement.  Converted to subcu insulin. Change Levemir to 10 units twice a day, keep on aggressive sliding scale every 4 hours.  Once she starts eating well, will start on prandial insulin. Patient was prescribed Trulicity and Jardiance which are very expensive medications, will prescribe generic insulin on discharge.  Acute hypoxemic respiratory failure secondary to DKA, metabolic encephalopathy: Improved.  On room air now.  Continue to monitor.  Mobilize.  Respite therapy and incentive spirometry. Patient treated for aspiration  pneumonia/community-acquired pneumonia.  Treated with vancomycin and Zosyn.  Discontinue vancomycin.  We will continue Zosyn today and reevaluate tomorrow morning.  Altered mental status with encephalopathy: Resolved.  Due to DKA.  CT head was negative.  Acute renal failure: Improved.  Normalized.  Remove Foley catheter when mobilized.  Hypernatremia: Patient has not eaten or have any drink since yesterday morning.  Swallow evaluation and is started on free water today.  Keep on D5 half-normal saline.  Okay to transfer to MedSurg bed with telemetry.    DVT prophylaxis: SCDs Start: 01/22/20 1404 heparin injection 5,000 Units Start: 01/21/20 0400   Code Status: Full code. Family Communication: None today.  We will communicate with her son regarding insulin management. Disposition Plan: Status is: Inpatient  Remains inpatient appropriate because:Persistent severe electrolyte disturbances and IV treatments appropriate due to intensity of illness or inability to take PO   Dispo: The patient is from: Home              Anticipated d/c is to: Home              Anticipated d/c date is: 2 days              Patient currently is not medically stable to d/c.         Consultants:   PCCM  Procedures:   Mechanical ventilation  Antimicrobials:  Antibiotics Given (last 72 hours)    Date/Time Action Medication Dose Rate   01/23/20 2030 New Bag/Given   vancomycin (VANCOREADY) IVPB 1500 mg/300 mL 1,500 mg 150 mL/hr   01/23/20 2259 New Bag/Given   piperacillin-tazobactam (ZOSYN) IVPB 3.375 g 3.375 g 12.5 mL/hr   01/24/20 1023 New Bag/Given   piperacillin-tazobactam (ZOSYN) IVPB 3.375 g  3.375 g 12.5 mL/hr   01/24/20 1628 New Bag/Given   piperacillin-tazobactam (ZOSYN) IVPB 3.375 g 3.375 g 12.5 mL/hr   01/24/20 2151 New Bag/Given   piperacillin-tazobactam (ZOSYN) IVPB 3.375 g 3.375 g 12.5 mL/hr   01/25/20 0526 New Bag/Given   vancomycin (VANCOCIN) IVPB 1000 mg/200 mL premix 1,000 mg  200 mL/hr   01/25/20 0644 New Bag/Given   piperacillin-tazobactam (ZOSYN) IVPB 3.375 g 3.375 g 12.5 mL/hr   01/25/20 1425 New Bag/Given   piperacillin-tazobactam (ZOSYN) IVPB 3.375 g 3.375 g 12.5 mL/hr   01/25/20 2139 New Bag/Given   piperacillin-tazobactam (ZOSYN) IVPB 3.375 g 3.375 g 12.5 mL/hr   01/26/20 0555 New Bag/Given   piperacillin-tazobactam (ZOSYN) IVPB 3.375 g 3.375 g 12.5 mL/hr   01/26/20 1305 New Bag/Given   piperacillin-tazobactam (ZOSYN) IVPB 3.375 g 3.375 g 12.5 mL/hr         Subjective: Patient was seen and examined in ICU.  She was alert awake, poor historian overall.  She was thirsty and hungry.  Later on evaluated by speech therapy and is started on diet.  Blood sugar remained stable.  Objective: Vitals:   01/26/20 1200 01/26/20 1300 01/26/20 1400 01/26/20 1500  BP: 138/79 130/62 (!) 144/85   Pulse: 84 87 86 83  Resp: (!) 21 18 20  (!) 22  Temp:      TempSrc:      SpO2: 98% 98% 99% 94%  Weight:      Height:        Intake/Output Summary (Last 24 hours) at 01/26/2020 1633 Last data filed at 01/26/2020 1101 Gross per 24 hour  Intake 2569.55 ml  Output 1450 ml  Net 1119.55 ml   Filed Weights   01/23/20 0500 01/25/20 0500 01/26/20 0417  Weight: 93.3 kg 92.5 kg 93.6 kg    Examination:  General exam: Appears calm and comfortable , chronically sick looking.  Not in any distress. Respiratory system: Clear to auscultation.  Some conducted airway sounds. Cardiovascular system: S1 & S2 heard, RRR. No JVD, murmurs, rubs, gallops or clicks. No pedal edema. Gastrointestinal system: Abdomen is nondistended, soft and nontender. No organomegaly or masses felt. Normal bowel sounds heard. Central nervous system: Alert and oriented. No focal neurological deficits. Extremities: Symmetric 5 x 5 power. Skin: No rashes, lesions or ulcers Psychiatry: Judgement and insight appear normal. Mood & affect flat.    Data Reviewed: I have personally reviewed following labs and  imaging studies  CBC: Recent Labs  Lab 01/20/20 2216 01/21/20 0325 01/22/20 0415 01/23/20 0323 01/26/20 0531  WBC 15.1* 13.6* 5.1 4.5 5.2  HGB 12.9 13.4 9.8* 9.8* 8.9*  HCT 40.1 40.6 26.5* 27.0* 27.4*  MCV 99.5 96.9 89.5 90.0 97.5  PLT 231 169 89* 68* 174   Basic Metabolic Panel: Recent Labs  Lab 01/22/20 0002 01/22/20 0415 01/23/20 0323 01/23/20 0717 01/24/20 0704 01/24/20 0704 01/24/20 1140 01/24/20 1555 01/24/20 2017 01/25/20 0837 01/25/20 1241 01/26/20 0531  NA 137   < > 138   < > 146*   < > 148* 144 144 146*  --  150*  K 3.5   < > 4.2   < > 3.9   < > 3.9 4.4 4.4 4.0  --  3.5  CL 100   < > 102   < > 107   < > 106 106 104 106  --  111  CO2 22   < > 26   < > 26   < > 26 22 26 30   --  32  GLUCOSE 312*   < > 330*   < > 141*   < > 153* 305* 378* 206*  --  161*  BUN 45*   < > 37*   < > 32*   < > 34* 35* 38* 41*  --  31*  CREATININE 3.35*   < > 2.83*   < > 2.56*   < > 2.36* 2.45* 2.38* 2.17*  --  1.42*  CALCIUM 8.3*   < > 7.6*   < > 8.3*   < > 8.3* 8.1* 7.8* 8.1*  --  8.5*  MG 2.2  --  1.8  --  1.7  --   --   --   --   --  1.5* 2.3  PHOS <1.0*  --  <1.0*  --  1.8*  --   --   --   --   --  3.1 3.2   < > = values in this interval not displayed.   GFR: Estimated Creatinine Clearance: 47.6 mL/min (A) (by C-G formula based on SCr of 1.42 mg/dL (H)). Liver Function Tests: Recent Labs  Lab 01/20/20 2216 01/26/20 0531  AST 26  --   ALT 23  --   ALKPHOS 89  --   BILITOT 2.5*  --   PROT 7.6  --   ALBUMIN 4.2 2.6*   No results for input(s): LIPASE, AMYLASE in the last 168 hours. No results for input(s): AMMONIA in the last 168 hours. Coagulation Profile: No results for input(s): INR, PROTIME in the last 168 hours. Cardiac Enzymes: No results for input(s): CKTOTAL, CKMB, CKMBINDEX, TROPONINI in the last 168 hours. BNP (last 3 results) No results for input(s): PROBNP in the last 8760 hours. HbA1C: No results for input(s): HGBA1C in the last 72 hours. CBG: Recent  Labs  Lab 01/25/20 2010 01/25/20 2323 01/26/20 0344 01/26/20 0715 01/26/20 1147  GLUCAP 262* 215* 136* 144* 179*   Lipid Profile: Recent Labs    01/24/20 1555  TRIG 315*   Thyroid Function Tests: No results for input(s): TSH, T4TOTAL, FREET4, T3FREE, THYROIDAB in the last 72 hours. Anemia Panel: No results for input(s): VITAMINB12, FOLATE, FERRITIN, TIBC, IRON, RETICCTPCT in the last 72 hours. Sepsis Labs: Recent Labs  Lab 01/20/20 2217 01/21/20 0045 01/21/20 0325 01/23/20 1135 01/24/20 0704 01/25/20 0837  PROCALCITON  --   --  2.20 3.76 >150.00 136.13  LATICACIDVEN 3.5* 2.0*  --   --   --   --     Recent Results (from the past 240 hour(s))  SARS Coronavirus 2 by RT PCR (hospital order, performed in [place] hospital lab) Nasopharyngeal Nasopharyngeal Swab     Status: None   Collection Time: 01/21/20 12:45 AM   Specimen: Nasopharyngeal Swab  Result Value Ref Range Status   SARS Coronavirus 2 NEGATIVE NEGATIVE Final    Comment: (NOTE) SARS-CoV-2 target nucleic acids are NOT DETECTED.  The SARS-CoV-2 RNA is generally detectable in upper and lower respiratory specimens during the acute phase of infection. The lowest concentration of SARS-CoV-2 viral copies this assay can detect is 250 copies / mL. A negative result does not preclude SARS-CoV-2 infection and should not be used as the sole basis for treatment or other patient management decisions.  A negative result may occur with improper specimen collection / handling, submission of specimen other than nasopharyngeal swab, presence of viral mutation(s) within the areas targeted by this assay, and inadequate number of viral copies (<250 copies / mL). A negative  result must be combined with clinical observations, patient history, and epidemiological information.  Fact Sheet for Patients:   StrictlyIdeas.no  Fact Sheet for Healthcare  Providers: BankingDealers.co.za  This test is not yet approved or  cleared by the [place] FDA and has been authorized for detection and/or diagnosis of SARS-CoV-2 by FDA under an Emergency Use Authorization (EUA).  This EUA will remain in effect (meaning this test can be used) for the duration of the COVID-19 declaration under Section 564(b)(1) of the Act, 21 U.S.C. section 360bbb-3(b)(1), unless the authorization is terminated or revoked sooner.  Performed at [facility], [address] [place], [place] [address]   CULTURE, BLOOD (ROUTINE X 2) w Reflex to ID Panel     Status: None   Collection Time: 01/21/20  3:24 AM   Specimen: BLOOD RIGHT FOREARM  Result Value Ref Range Status   Specimen Description BLOOD RIGHT FOREARM  Final   Special Requests   Final    BOTTLES DRAWN AEROBIC AND ANAEROBIC Blood Culture adequate volume   Culture   Final    NO GROWTH 5 DAYS Performed at [facility], [address] [place], [place] [address]    Report Status 01/26/2020 FINAL  Final  CULTURE, BLOOD (ROUTINE X 2) w Reflex to ID Panel     Status: None   Collection Time: 01/21/20  3:24 AM   Specimen: BLOOD  Result Value Ref Range Status   Specimen Description BLOOD LEFT ANTECUBITAL  Final   Special Requests   Final    BOTTLES DRAWN AEROBIC AND ANAEROBIC Blood Culture adequate volume   Culture   Final    NO GROWTH 5 DAYS Performed at [facility], [address] [place], [place] [address]    Report Status 01/26/2020 FINAL  Final  Urine Culture     Status: None   Collection Time: 01/21/20  5:06 AM   Specimen: Urine, Random  Result Value Ref Range Status   Specimen Description   Final    URINE, RANDOM Performed at [facility], [address] [place], [place] [address]    Special Requests   Final    NONE Performed at [facility], [address], [place], [place] [address]    Culture   Final    NO  GROWTH Performed at [facility], [place] [address] [place], [place] [address]    Report Status 01/22/2020 FINAL  Final  Culture, respiratory (non-expectorated)     Status: None   Collection Time: 01/23/20  6:00 PM   Specimen: Tracheal Aspirate; Respiratory  Result Value Ref Range Status   Specimen Description   Final    TRACHEAL ASPIRATE Performed at [facility], [address], [place], [place] [address]    Special Requests   Final    NONE Performed at [facility], [address] [place], [place] [address]    Gram Stain   Final    FEW WBC PRESENT, PREDOMINANTLY PMN MODERATE GRAM NEGATIVE RODS FEW GRAM POSITIVE COCCI RARE GRAM POSITIVE RODS    Culture   Final    RARE Normal respiratory flora-no Staph aureus or Pseudomonas seen Performed at [facility], [address], [place], [place] [address]    Report Status 01/26/2020 FINAL  Final  Culture, blood (routine x 2)     Status: None (Preliminary result)   Collection Time: 01/23/20  6:45 PM   Specimen: BLOOD  Result Value Ref Range Status  Specimen Description BLOOD BLOOD LEFT HAND  Final   Special Requests   Final    BOTTLES DRAWN AEROBIC AND ANAEROBIC Blood Culture adequate volume   Culture   Final    NO GROWTH 3 DAYS Performed at [facility], [address] [place], [place] [address]    Report Status PENDING  Incomplete  Culture, blood (routine x 2)     Status: None (Preliminary result)   Collection Time: 01/23/20  7:56 PM   Specimen: BLOOD  Result Value Ref Range Status   Specimen Description BLOOD BLOOD RIGHT HAND  Final   Special Requests   Final    BOTTLES DRAWN AEROBIC AND ANAEROBIC Blood Culture results may not be optimal due to an excessive volume of blood received in culture bottles   Culture   Final    NO GROWTH 3 DAYS Performed at [facility], [address], [place], [place] [address]    Report Status PENDING  Incomplete  MRSA PCR Screening      Status: None   Collection Time: 01/26/20  8:42 AM   Specimen: Nasal Mucosa; Nasopharyngeal  Result Value Ref Range Status   MRSA by PCR NEGATIVE NEGATIVE Final    Comment:        The GeneXpert MRSA Assay (FDA approved for NASAL specimens only), is one component of a comprehensive MRSA colonization surveillance program. It is not intended to diagnose MRSA infection nor to guide or monitor treatment for MRSA infections. Performed at [facility], [address] [place], [place] [address]          Radiology Studies: [facility] Chest [place] 1 View  Result Date: 01/26/2020 CLINICAL DATA:  Acute respiratory failure. EXAM: PORTABLE CHEST 1 VIEW COMPARISON:  01/21/2020. FINDINGS: Mild cardiac enlargement. No pleural effusion or edema. Diffuse, chronic interstitial coarsening noted bilaterally. Left midlung nodular density is persistent when compared with previous exam. IMPRESSION: 1. Persistent left midlung nodular density. Further investigation with contrast enhanced CT of the chest is advised to assess for underlying malignancy. 2. No new findings. Electronically Signed   By: Kerby Moors M.D.   On: 01/26/2020 07:50        Scheduled Meds: . Chlorhexidine Gluconate Cloth  6 each Topical Daily  . citalopram  20 mg Per Tube Daily  . docusate  100 mg Oral BID  . heparin  5,000 Units Subcutaneous Q8H  . insulin aspart  0-15 Units Subcutaneous Q4H  . insulin glargine  10 Units Subcutaneous BID  . LORazepam  1 mg Per Tube BID  . midazolam  2 mg Intravenous Once  . pantoprazole (PROTONIX) IV  40 mg Intravenous Q24H  . polyethylene glycol  17 g Oral Daily  . sodium chloride flush  10-40 mL Intracatheter Q12H   Continuous Infusions: . sodium chloride Stopped (01/22/20 1720)  . piperacillin-tazobactam (ZOSYN)  IV 3.375 g (01/26/20 1305)     LOS: 6 days    Time spent: 35 minutes    Barb Merino, MD [facility] Pager [phone]

## 2020-01-26 NOTE — Evaluation (Signed)
Occupational Therapy Evaluation Patient Details Name: Natalie [name] MRN: [id number] DOB: February 15, 1961 Today's Date: 01/26/2020    History of Present Illness Per MD note: 59 year old female with history of type 2 diabetes apparently on Trulicity and Jardiance, recently not taking medicine because they were too expensive presented to the emergency room on 8/3 with unresponsiveness and hypoglycemia.  Patient recently moved from [place], no insurance here and has been out of insulin for some time.  EMS found her with saturation 70% requiring nonrebreather mask, blood glucose 570.  Patient was encephalopathic with pinpoint pupils.  In the ER, she received dose of Narcan with improvement in mentation.  Insulin GTT started and admitted to ICU. Pt now off gtt and extub 8/8.   Clinical Impression   Pt was seen for OT evaluation this date. Prior to hospital admission, pt was Indep with self care ADLs and most IADLs with assist from her son for transport to appts and getting groceries. Pt lives in apt with her son on second floor with ~12 steps to get to her level. Currently pt demonstrates impairments as described below (See OT problem list) which functionally limit her ability to perform ADL/self-care tasks. Pt currently requires MIN/MOD A with arm in arm for transfers and MAX A with LB dressing in sitting, MAX A with peri care, Setup to MIN A with UB ADLs including self-feeding.  Pt would benefit from skilled OT to address noted impairments and functional limitations (see below for any additional details) in order to maximize safety and independence while minimizing falls risk and caregiver burden. Upon hospital discharge, recommend STR to maximize pt safety and return to PLOF.     Follow Up Recommendations  SNF    Equipment Recommendations  3 in 1 bedside commode;Tub/shower seat    Recommendations for Other Services       Precautions / Restrictions Precautions Precautions:  Fall Restrictions Weight Bearing Restrictions: No      Mobility Bed Mobility Overal bed mobility: Needs Assistance Bed Mobility: Supine to Sit     Supine to sit: Min assist;HOB elevated        Transfers Overall transfer level: Needs assistance Equipment used: 1 person hand held assist Transfers: Sit to/from [facility] Sit to Stand: Min assist;Mod assist Stand pivot transfers: Min assist;Mod assist       General transfer comment: arm in arm technique, cues for safety, line mgt assist    Balance Overall balance assessment: Needs assistance Sitting-balance support: Single extremity supported Sitting balance-Leahy Scale: Fair     Standing balance support: Bilateral upper extremity supported Standing balance-Leahy Scale: Poor Standing balance comment: requires UE support and MIN/MOD A for t/f, MIN A for static                           ADL either performed or assessed with clinical judgement   ADL Overall ADL's : Needs assistance/impaired                                       General ADL Comments: MAX A with LB dressing in sitting, MAX A with peri care, MIN/MOD A with SPS t/f to/from [facility] and recliner with arm in arm. Setup to MIN A with UB ADLs including self-feeding to scoop ice chips to mouth in sitting.     Vision Baseline Vision/History: Wears glasses Wears Glasses: At all times  Patient Visual Report: No change from baseline       Perception     Praxis      Pertinent Vitals/Pain Pain Assessment: No/denies pain     Hand Dominance Right   Extremity/Trunk Assessment Upper Extremity Assessment Upper Extremity Assessment: Overall WFL for tasks assessed;Generalized weakness (ROM WFL, MMT grossly 4/5)   Lower Extremity Assessment Lower Extremity Assessment: Defer to PT evaluation;Generalized weakness       Communication Communication Communication: No difficulties   Cognition Arousal/Alertness:  Awake/alert Behavior During Therapy: WFL for tasks assessed/performed Overall Cognitive Status: Impaired/Different from baseline Area of Impairment: Orientation;Following commands;Safety/judgement;Problem solving                 Orientation Level: Disoriented to;Time;Place (knows she is in hospital, but forgot which town)     Following Commands: Follows one step commands with increased time Safety/Judgement: Decreased awareness of deficits   Problem Solving: Slow processing;Requires verbal cues     General Comments       Exercises Other Exercises Other Exercises: OT facilaites ed re: role of OT, potential equipment needs for home, potential need for rehab following hospitalization, safety with ADL transfers including use of call light for assist. Pt with moderate understanding, will require f/u.   Shoulder Instructions      Home Living Family/patient expects to be discharged to:: Private residence Living Arrangements: Children (son) Available Help at Discharge: Family;Available PRN/intermittently (son works) Type of Home: Apartment Home Access: Stairs to enter Technical brewer of Steps: 12 Entrance Stairs-Rails: Right;Left Home Layout: One level     Bathroom Shower/Tub: Teacher, early years/pre: [employer] - single point          Prior Functioning/Environment Level of Independence: Independent with assistive device(s)        Comments: MOD I with fxl mobility with use of SPC, son drives pt to appts and gets groceries, pt indep wtih self care ADLs and HH IADLs.        OT Problem List: Decreased strength;Decreased activity tolerance;Impaired balance (sitting and/or standing);Decreased cognition;Decreased safety awareness;Decreased knowledge of use of DME or AE;Cardiopulmonary status limiting activity      OT Treatment/Interventions: Self-care/ADL training;Therapeutic exercise;DME and/or AE instruction;Therapeutic  activities;Balance training;Patient/family education    OT Goals(Current goals can be found in the care plan section) Acute Rehab OT Goals Patient Stated Goal: to go home OT Goal Formulation: With patient Time For Goal Achievement: 02/09/20 Potential to Achieve Goals: Good  OT Frequency: Min 1X/week   Barriers to D/C:            Co-evaluation              AM-PAC OT "6 Clicks" Daily Activity     Outcome Measure Help from another person eating meals?: A Little Help from another person taking care of personal grooming?: A Little Help from another person toileting, which includes using toliet, bedpan, or urinal?: A Lot Help from another person bathing (including washing, rinsing, drying)?: A Lot Help from another person to put on and taking off regular upper body clothing?: A Little Help from another person to put on and taking off regular lower body clothing?: A Lot 6 Click Score: 15   End of Session Equipment Utilized During Treatment: Gait belt Nurse Communication: Mobility status  Activity Tolerance: Patient tolerated treatment well Patient left: in chair;with call bell/phone within reach;with chair alarm set  OT Visit Diagnosis: Unsteadiness on feet (R26.81);Muscle weakness (generalized) (M62.81)  Time: 1443-6016 OT Time Calculation (min): 57 min Charges:  OT General Charges $OT Visit: 1 Visit OT Evaluation $OT Eval Moderate Complexity: 1 Mod OT Treatments $Self Care/Home Management : 23-37 mins $Therapeutic Activity: 8-22 mins  Gerrianne Scale, MS, OTR/L ascom [phone] 01/26/20, 5:11 PM

## 2020-01-26 NOTE — Evaluation (Signed)
Clinical/Bedside Swallow Evaluation Patient Details  Name: Natalie [name] MRN: [id number] Date of Birth: [birth year]  Today's Date: 01/26/2020 Time: SLP Start Time (ACUTE ONLY): 0923 SLP Stop Time (ACUTE ONLY): 1345 SLP Time Calculation (min) (ACUTE ONLY): 60 min  Past Medical History: No past medical history on file. HPI:  Pt is a 59 yo female with a PMH of Type II Diabetes Mellitus who presented to [facility] ER on 08/3 with unresponsiveness and hyperglycemia.  Per ER notes pts son reported the pt recently moved from [place], has not obtained insurance here, and has been out of her insulin for days.  EMS reported pt hypoxic on RA with O2 sats in the 70's requiring NRB and CBG 570.  EMS also had concern of possible polysubstance abuse due to suspected track marks on pts arms.  Upon arrival to the ER pt remained encephalopathic with pinpoint pupils.  Therefore, she received 1 mg of iv narcan x1 dose with improvement in mentation. Pt also noted to have Kussmaul respirations, lab result ruled pt in for DKA.  Insulin gtt initiated.  Pt was orally intubated/extubated d/t complications of DKA.    Assessment / Plan / Recommendation Clinical Impression  Pt appears to present w/ grossly adequate oropharyngeal phase swallow w/ No significant, acute oropharyngeal phase dysphagia noted, No Neuromuscular deficits noted.Pt consumed po trials w/ No immediate, overt clinical s/s of aspiration during po trials. Pt appears at reduced risk for aspiration when following general aspiration precautions including thin liquids Via CUP, No Straws AND taking her Time w/ all sips, Slowly. During po trials, pt consumed all consistencies w/ no immediate, overt coughing, decline in vocal quality, or change in respiratory presentation during trials. A Delayed cough occurred post sequential sips but did not appear directly related to the po trials; no decline in O2 sats (99%) or RR 21. Vocal quality was clear post trials. Pt has  Baseline phlegm post oral extubation yesterday and exhibits cough at Baseline per NSG. The cough did NOT increase in frequency as po trials continued. Oral phase appeared [facility] w/ timely bolus management and control of bolus propulsion for A-P transfer for swallowing of thin liquids and purees; min increased Time noted for mastication/gumming of increased textured trials d/t missing Upper Dentition; some missing Lower Dentition and gum soreness; NSG informed. Oral clearing achieved w/ all trial consistencies moistening the trials well and giving Time. OM Exam appeared [facility] w/ no unilateral weakness noted. Speech Clear. Pt fed self w/ setup support. Recommend a Mech Soft diet consistency w/ MINCED meats, moistened/cooked foods; Thin liquidsVIA CUP. Recommend general aspiration precautions, Pills WHOLE in Puree for safer, easier swallowing. Education given on Pills in Puree; food consistencies and easy to eat options d/t Dentition status; general aspiration precautions including No Straws. ST services will f/u w/ toleration of diet x1-2. NSG agreed.    SLP Visit Diagnosis: Dysphagia, oral phase (R13.11) (missing Dentition for mastication; soreness)    Aspiration Risk  Mild aspiration risk;Risk for inadequate nutrition/hydration (reduced following precautions)    Diet Recommendation  Dysphagia level 3 w/ MINCED meats, gravies added to moisten. Thin liquids VIA CUP only; NO Straws. General aspiration precautions. Tray setup and positioning at meals.  Medication Administration: Whole meds with puree (for safer swallowing)    Other  Recommendations Recommended Consults:  (Dietician f/u) Oral Care Recommendations: Oral care BID;Oral care before and after PO;Staff/trained caregiver to provide oral care Other Recommendations:  (n/a at this time)   Follow up Recommendations None  Frequency and Duration min 2x/week  1 week       Prognosis Prognosis for Safe Diet Advancement: Fair (-Good) Barriers to  Reach Goals: Severity of deficits;Motivation Barriers/Prognosis Comment: Dentition      Swallow Study   General Date of Onset: 01/21/20 HPI: Pt is a 59 yo female with a PMH of Type II Diabetes Mellitus who presented to [facility] ER on 08/3 with unresponsiveness and hyperglycemia.  Per ER notes pts son reported the pt recently moved from [place], has not obtained insurance here, and has been out of her insulin for days.  EMS reported pt hypoxic on RA with O2 sats in the 70's requiring NRB and CBG 570.  EMS also had concern of possible polysubstance abuse due to suspected track marks on pts arms.  Upon arrival to the ER pt remained encephalopathic with pinpoint pupils.  Therefore, she received 1 mg of iv narcan x1 dose with improvement in mentation. Pt also noted to have Kussmaul respirations, lab result ruled pt in for DKA.  Insulin gtt initiated.  Pt was orally intubated/extubated d/t complications of DKA.  Type of Study: Bedside Swallow Evaluation Previous Swallow Assessment: none Diet Prior to this Study: NPO (regular diet at home) Temperature Spikes Noted: No (wbc 5.2) Respiratory Status: Nasal cannula (2L) History of Recent Intubation: Yes Length of Intubations (days): 5 days Date extubated: 01/25/20 Behavior/Cognition: Alert;Cooperative;Pleasant mood;Distractible;Requires cueing Oral Cavity Assessment: Dry (min) Oral Care Completed by SLP: Yes Oral Cavity - Dentition: Poor condition;Missing dentition (mouth sore on her R side d/t poor dentition) Vision: Functional for self-feeding Self-Feeding Abilities: Able to feed self;Needs assist;Needs set up Patient Positioning: Upright in bed (needed full positioning) Baseline Vocal Quality: Normal Volitional Cough: Strong Volitional Swallow: Able to elicit    Oral/Motor/Sensory Function Overall Oral Motor/Sensory Function: Within functional limits   Ice Chips Ice chips: Within functional limits Presentation: Spoon (fed; 3 trials)   Thin  Liquid Thin Liquid: Impaired Presentation: Cup;Self Fed (12 trials) Oral Phase Impairments:  (none) Pharyngeal  Phase Impairments: Cough - Delayed (x2) Other Comments: did not appear directly related to the po trials; no decline in O2 sats (99%) or RR 21. Vocal quality was clear post trials. Pt has Baseline phlegm post oral extubation yesterday per NSG and exhibits cough at baseline.     Nectar Thick Nectar Thick Liquid: Not tested   Honey Thick Honey Thick Liquid: Not tested   Puree Puree: Within functional limits Presentation: Spoon;Self Fed (8 trails)   Solid     Solid: Impaired Presentation: Self Fed;Spoon (5 trials) Oral Phase Impairments: Impaired mastication (missing dentition) Oral Phase Functional Implications: Impaired mastication (missing dentition) Pharyngeal Phase Impairments:  (none)       Orinda Kenner, MS, CCC-SLP Jailynne Opperman 01/26/2020,4:06 PM

## 2020-01-26 NOTE — Progress Notes (Signed)
Pt transferred to RM #[id number] at this time. VSS prior to transfer. Report given to receiving RN.

## 2020-01-27 LAB — GLUCOSE, CAPILLARY
Glucose-Capillary: 244 mg/dL — ABNORMAL HIGH (ref 70–99)
Glucose-Capillary: 213 mg/dL — ABNORMAL HIGH (ref 70–99)
Glucose-Capillary: 152 mg/dL — ABNORMAL HIGH (ref 70–99)
Glucose-Capillary: 221 mg/dL — ABNORMAL HIGH (ref 70–99)
Glucose-Capillary: 243 mg/dL — ABNORMAL HIGH (ref 70–99)
Glucose-Capillary: 235 mg/dL — ABNORMAL HIGH (ref 70–99)

## 2020-01-27 LAB — BASIC METABOLIC PANEL
Anion gap: 12 (ref 5–15)
BUN: 20 mg/dL (ref 6–20)
CO2: 28 mmol/L (ref 22–32)
Calcium: 8.6 mg/dL — ABNORMAL LOW (ref 8.9–10.3)
Chloride: 104 mmol/L (ref 98–111)
Creatinine, Ser: 1.27 mg/dL — ABNORMAL HIGH (ref 0.44–1.00)
GFR calc Af Amer: 53 mL/min — ABNORMAL LOW (ref >60)
GFR calc non Af Amer: 46 mL/min — ABNORMAL LOW (ref >60)
Glucose, Bld: 219 mg/dL — ABNORMAL HIGH (ref 70–99)
Potassium: 3 mmol/L — ABNORMAL LOW (ref 3.5–5.1)
Sodium: 144 mmol/L (ref 135–145)

## 2020-01-27 LAB — MAGNESIUM: Magnesium: 1.8 mg/dL (ref 1.7–2.4)

## 2020-01-27 MED ORDER — AMOXICILLIN-POT CLAVULANATE 875-125 MG PO TABS
1.0000 | ORAL_TABLET | Freq: Two times a day (BID) | ORAL | Status: DC
  Administered 2020-01-27 – 2020-01-30 (×7): 1 via ORAL
  Filled 2020-01-27 (×7): qty 1

## 2020-01-27 MED ORDER — INSULIN ASPART 100 UNIT/ML ~~LOC~~ SOLN
0.0000 [IU] | Freq: Three times a day (TID) | SUBCUTANEOUS | Status: DC
  Administered 2020-01-27 – 2020-01-28 (×2): 3 [IU] via SUBCUTANEOUS
  Administered 2020-01-28 – 2020-01-29 (×3): 2 [IU] via SUBCUTANEOUS
  Administered 2020-01-29 – 2020-01-30 (×3): 3 [IU] via SUBCUTANEOUS
  Filled 2020-01-27 (×8): qty 1

## 2020-01-27 MED ORDER — FLUTICASONE FUROATE-VILANTEROL 100-25 MCG/INH IN AEPB
1.0000 | INHALATION_SPRAY | Freq: Every day | RESPIRATORY_TRACT | Status: DC
  Administered 2020-01-27 – 2020-01-30 (×4): 1 via RESPIRATORY_TRACT
  Filled 2020-01-27: qty 28

## 2020-01-27 MED ORDER — INSULIN ASPART 100 UNIT/ML ~~LOC~~ SOLN
4.0000 [IU] | Freq: Three times a day (TID) | SUBCUTANEOUS | Status: DC
  Administered 2020-01-27 – 2020-01-28 (×2): 4 [IU] via SUBCUTANEOUS
  Filled 2020-01-27 (×2): qty 1

## 2020-01-27 MED ORDER — ENSURE MAX PROTEIN PO LIQD
11.0000 [oz_av] | Freq: Every day | ORAL | Status: DC
  Administered 2020-01-28 – 2020-01-30 (×3): 11 [oz_av] via ORAL
  Filled 2020-01-27: qty 330

## 2020-01-27 MED ORDER — ADULT MULTIVITAMIN W/MINERALS CH
1.0000 | ORAL_TABLET | Freq: Every day | ORAL | Status: DC
  Administered 2020-01-27 – 2020-01-30 (×4): 1 via ORAL
  Filled 2020-01-27 (×4): qty 1

## 2020-01-27 MED ORDER — INSULIN GLARGINE 100 UNIT/ML ~~LOC~~ SOLN
12.0000 [IU] | Freq: Two times a day (BID) | SUBCUTANEOUS | Status: DC
  Administered 2020-01-27 – 2020-01-29 (×4): 12 [IU] via SUBCUTANEOUS
  Filled 2020-01-27 (×6): qty 0.12

## 2020-01-27 MED ORDER — FLUTICASONE-UMECLIDIN-VILANT 100-62.5-25 MCG/INH IN AEPB: 1.0000 | INHALATION_SPRAY | Freq: Every day | RESPIRATORY_TRACT | Status: DC

## 2020-01-27 MED ORDER — MAGNESIUM OXIDE 400 (241.3 MG) MG PO TABS
400.0000 mg | ORAL_TABLET | Freq: Two times a day (BID) | ORAL | Status: DC
  Administered 2020-01-27 – 2020-01-30 (×7): 400 mg via ORAL
  Filled 2020-01-27 (×7): qty 1

## 2020-01-27 MED ORDER — UMECLIDINIUM BROMIDE 62.5 MCG/INH IN AEPB
1.0000 | INHALATION_SPRAY | Freq: Every day | RESPIRATORY_TRACT | Status: DC
  Administered 2020-01-27 – 2020-01-30 (×4): 1 via RESPIRATORY_TRACT
  Filled 2020-01-27: qty 7

## 2020-01-27 MED ORDER — ATORVASTATIN CALCIUM 20 MG PO TABS
40.0000 mg | ORAL_TABLET | Freq: Every day | ORAL | Status: DC
  Administered 2020-01-27 – 2020-01-29 (×3): 40 mg via ORAL
  Filled 2020-01-27 (×3): qty 2

## 2020-01-27 MED ORDER — BENZTROPINE MESYLATE 1 MG PO TABS
1.0000 mg | ORAL_TABLET | Freq: Every day | ORAL | Status: DC
  Administered 2020-01-27 – 2020-01-30 (×4): 1 mg via ORAL
  Filled 2020-01-27 (×4): qty 1

## 2020-01-27 MED ORDER — INSULIN STARTER KIT- PEN NEEDLES (ENGLISH)
1.0000 | Freq: Once | Status: AC
  Administered 2020-01-27: 10:00:00 1
  Filled 2020-01-27: qty 1

## 2020-01-27 MED ORDER — LATANOPROST 0.005 % OP SOLN
1.0000 [drp] | Freq: Every day | OPHTHALMIC | Status: DC
  Administered 2020-01-27 – 2020-01-29 (×3): 1 [drp] via OPHTHALMIC
  Filled 2020-01-27: qty 2.5

## 2020-01-27 MED ORDER — INSULIN ASPART 100 UNIT/ML ~~LOC~~ SOLN
0.0000 [IU] | Freq: Every day | SUBCUTANEOUS | Status: DC
  Administered 2020-01-27 – 2020-01-28 (×2): 3 [IU] via SUBCUTANEOUS
  Administered 2020-01-29: 21:00:00 4 [IU] via SUBCUTANEOUS
  Filled 2020-01-27 (×3): qty 1

## 2020-01-27 MED ORDER — ZIPRASIDONE HCL 40 MG PO CAPS
40.0000 mg | ORAL_CAPSULE | Freq: Every day | ORAL | Status: DC
  Administered 2020-01-27 – 2020-01-29 (×3): 40 mg via ORAL
  Filled 2020-01-27 (×4): qty 1

## 2020-01-27 MED ORDER — POTASSIUM CHLORIDE CRYS ER 20 MEQ PO TBCR
40.0000 meq | EXTENDED_RELEASE_TABLET | Freq: Two times a day (BID) | ORAL | Status: AC
  Administered 2020-01-27 – 2020-01-28 (×4): 40 meq via ORAL
  Filled 2020-01-27 (×4): qty 2

## 2020-01-27 NOTE — Evaluation (Signed)
Physical Therapy Evaluation Patient Details Name: Natalie [name] MRN: [id number] DOB: Dec 19, 1960 Today's Date: 01/27/2020   History of Present Illness  59 year old female with history of type 2 diabetes apparently on Trulicity and Jardiance, recently not taking medicine because they were too expensive presented to the emergency room on 8/3 with unresponsiveness and hypoglycemia.  Patient recently moved from [place], no insurance here and has been out of insulin for some time.  EMS found her with saturation 70% requiring nonrebreather mask, blood glucose 570.  Patient was encephalopathic with pinpoint pupils.  In the ER, she received dose of Narcan with improvement in mentation.  Insulin GTT started and admitted to ICU. Intubated 8/5 - 8/8.  Clinical Impression  Pt eager to work with PT but was quite limited compared to her baseline.  She was able to ambulate ~35 ft but had multiple LOBs, one of which required considerable assist to keep from falling backward.  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  She agrees that she is not safe/strong enough to go home and is open to the idea of rehab.  She showed good effort and stable vitals t/o PT session, but had multiple safety issues and consistent unsteadiness even with very slow and guarded ambulation.     Follow Up Recommendations SNF;Supervision/Assistance - 24 hour    Equipment Recommendations   (TBD at next venue of care)    Recommendations for Other Services       Precautions / Restrictions Precautions Precautions: Fall Restrictions Weight Bearing Restrictions: No      Mobility  Bed Mobility               General bed mobility comments: in recliner on arrival, not tested  Transfers Overall transfer level: Needs assistance Equipment used: Rolling walker (2 wheeled) Transfers: Sit to/from Stand Sit to Stand: Min assist         General transfer comment: Pt  needing extra cuing to UE use, set up, walker manipulation.    Ambulation/Gait Ambulation/Gait assistance: Min assist;Mod assist Gait Distance (Feet): 35 Feet Assistive device: Rolling walker (2 wheeled)       General Gait Details: Pt was able to ambulate with walker very slowly and with heavy reliance on walker.  She did have multiple LOBs, one requiring mod assist to keep from falling backward.  Pt reports feeling significantly different than her baseline.   Stairs            Wheelchair Mobility    Modified Rankin (Stroke Patients Only)       Balance Overall balance assessment: Needs assistance   Sitting balance-Leahy Scale: Fair     Standing balance support: Bilateral upper extremity supported Standing balance-Leahy Scale: Poor Standing balance comment: Pt had multiple LOBs while walking even with b/l UEs on walker, poor safety awareness and righting response                             Pertinent Vitals/Pain Pain Assessment: No/denies pain    Home Living Family/patient expects to be discharged to:: Private residence Living Arrangements: Children Available Help at Discharge: Family;Available PRN/intermittently (son works (apparently from home)) Type of Home: Apartment Home Access: Stairs to enter Entrance Stairs-Rails: Psychiatric nurse of Steps: 12 Home Layout: One level Home Equipment: Cane - single point      Prior Function Level of Independence: Independent with assistive device(s)  Comments: MOD I with fxl mobility with use of SPC, son drives pt to appts and gets groceries, pt indep wtih self care ADLs and HH IADLs.  Reports no recent falls until this AM.     Hand Dominance        Extremity/Trunk Assessment   Upper Extremity Assessment Upper Extremity Assessment: Generalized weakness    Lower Extremity Assessment Lower Extremity Assessment: Generalized weakness       Communication   Communication: No  difficulties (at times slow to process/answer)  Cognition Arousal/Alertness: Awake/alert Behavior During Therapy: WFL for tasks assessed/performed Overall Cognitive Status: Impaired/Different from baseline                         Following Commands: Follows one step commands with increased time Safety/Judgement: Decreased awareness of deficits   Problem Solving: Slow processing;Requires verbal cues        General Comments      Exercises     Assessment/Plan    PT Assessment Patient needs continued PT services  PT Problem List Decreased strength;Decreased range of motion;Decreased activity tolerance;Decreased balance;Decreased mobility;Decreased coordination;Decreased knowledge of use of DME;Decreased safety awareness;Decreased cognition       PT Treatment Interventions DME instruction;Gait training;Stair training;Functional mobility training;Therapeutic activities;Therapeutic exercise;Balance training;Neuromuscular re-education;Cognitive remediation;Patient/family education    PT Goals (Current goals can be found in the Care Plan section)  Acute Rehab PT Goals Patient Stated Goal: get strong/safe enough to go home PT Goal Formulation: With patient Time For Goal Achievement: 02/10/20 Potential to Achieve Goals: Fair    Frequency Min 2X/week   Barriers to discharge        Co-evaluation               AM-PAC PT "6 Clicks" Mobility  Outcome Measure Help needed turning from your back to your side while in a flat bed without using bedrails?: A Little Help needed moving from lying on your back to sitting on the side of a flat bed without using bedrails?: A Little Help needed moving to and from a bed to a chair (including a wheelchair)?: A Lot Help needed standing up from a chair using your arms (e.g., wheelchair or bedside chair)?: A Little Help needed to walk in hospital room?: A Lot Help needed climbing 3-5 steps with a railing? : A Lot 6 Click Score:  15    End of Session Equipment Utilized During Treatment: Gait belt Activity Tolerance: Patient limited by fatigue Patient left: with chair alarm set;with call bell/phone within reach Nurse Communication: Mobility status PT Visit Diagnosis: Muscle weakness (generalized) (M62.81);Difficulty in walking, not elsewhere classified (R26.2);Unsteadiness on feet (R26.81)    Time: 6712-4580 PT Time Calculation (min) (ACUTE ONLY): 22 min   Charges:   PT Evaluation $PT Eval Low Complexity: 1 Low PT Treatments $Gait Training: 8-22 mins        Kreg Shropshire, DPT 01/27/2020, 10:26 AM

## 2020-01-27 NOTE — Progress Notes (Signed)
Occupational Therapy Treatment Patient Details Name: Natalie [name] MRN: [id number] DOB: 1961/05/29 Today's Date: 01/27/2020    History of present illness 59 year old female with history of type 2 diabetes apparently on Trulicity and Jardiance, recently not taking medicine because they were too expensive presented to the emergency room on 8/3 with unresponsiveness and hypoglycemia.  Patient recently moved from [place], no insurance here and has been out of insulin for some time.  EMS found her with saturation 70% requiring nonrebreather mask, blood glucose 570.  Patient was encephalopathic with pinpoint pupils.  In the ER, she received dose of Narcan with improvement in mentation.  Insulin GTT started and admitted to ICU. Intubated 8/5 - 8/8.   OT comments  Pt seen for OT tx this date to f/u re: safety with ADLs/ADL mobility/transfers. Pt requires MIN A with RW for sit to stand to static standing with MIN verbal cues for safe hand placement. Pt tolerates static standing x2 mins to complete hand washing task with setup and MIN A to sustain static balance with alternating hands. Pt with some instances of sway and needs gentle correcting to maintain safe stand. SNF continues to appear to be safest d/c recommendation at this time.   Follow Up Recommendations  SNF    Equipment Recommendations  3 in 1 bedside commode;Tub/shower seat    Recommendations for Other Services      Precautions / Restrictions Precautions Precautions: Fall Restrictions Weight Bearing Restrictions: No       Mobility Bed Mobility               General bed mobility comments: in chair pre/post session  Transfers Overall transfer level: Needs assistance Equipment used: Rolling walker (2 wheeled) Transfers: Sit to/from Stand Sit to Stand: Min assist         General transfer comment: MIN verbal/tactile cues for safe hand placement    Balance Overall balance assessment: Needs  assistance Sitting-balance support: Single extremity supported Sitting balance-Leahy Scale: Fair     Standing balance support: Bilateral upper extremity supported Standing balance-Leahy Scale: Poor Standing balance comment: F with static stand with B UE support, P with attempts to alternate hands to participate in fxl task                           ADL either performed or assessed with clinical judgement   ADL Overall ADL's : Needs assistance/impaired     Grooming: Wash/dry hands;Standing;Minimal assistance Grooming Details (indicate cue type and reason): OT facilitates setup on BST in front of recliner and has pt stand with RW with MIN A to wash/dry hands. Pt requires MIN A to complete task for standing balance and MIN verbal cues for safety to alternate hands.                                     Vision Baseline Vision/History: Wears glasses Wears Glasses: At all times Patient Visual Report: No change from baseline     Perception     Praxis      Cognition Arousal/Alertness: Awake/alert Behavior During Therapy: WFL for tasks assessed/performed Overall Cognitive Status: Impaired/Different from baseline Area of Impairment: Orientation;Following commands;Safety/judgement;Problem solving                 Orientation Level: Disoriented to;Time     Following Commands: Follows one step commands with increased time;Follows one step commands consistently  Safety/Judgement: Decreased awareness of deficits   Problem Solving: Requires verbal cues          Exercises Other Exercises Other Exercises: OT facilitates ed re: safe hand placement with use of RW for ADL transfers   Shoulder Instructions       General Comments      Pertinent Vitals/ Pain       Pain Assessment: No/denies pain  Home Living                                          Prior Functioning/Environment              Frequency  Min 1X/week         Progress Toward Goals  OT Goals(current goals can now be found in the care plan section)  Progress towards OT goals: Progressing toward goals  Acute Rehab OT Goals Patient Stated Goal: get strong/safe enough to go home OT Goal Formulation: With patient Time For Goal Achievement: 02/09/20 Potential to Achieve Goals: Good  Plan Discharge plan remains appropriate    Co-evaluation                 AM-PAC OT "6 Clicks" Daily Activity     Outcome Measure   Help from another person eating meals?: A Little Help from another person taking care of personal grooming?: A Little Help from another person toileting, which includes using toliet, bedpan, or urinal?: A Lot Help from another person bathing (including washing, rinsing, drying)?: A Lot Help from another person to put on and taking off regular upper body clothing?: A Little Help from another person to put on and taking off regular lower body clothing?: A Lot 6 Click Score: 15    End of Session Equipment Utilized During Treatment: Gait belt  OT Visit Diagnosis: Unsteadiness on feet (R26.81);Muscle weakness (generalized) (M62.81)   Activity Tolerance Patient tolerated treatment well   Patient Left in chair;with call bell/phone within reach;with chair alarm set   Nurse Communication Mobility status        Time: 8867-7373 OT Time Calculation (min): 16 min  Charges: OT General Charges $OT Visit: 1 Visit OT Treatments $Self Care/Home Management : 8-22 mins  Gerrianne Scale, [place], OTR/L ascom [phone] 01/27/20, 4:34 PM

## 2020-01-27 NOTE — TOC Initial Note (Signed)
Transition of Care [facility]) - Initial/Assessment Note    Patient Details  Name: Natalie [name] MRN: [id number] Date of Birth: Jul 07, 1960  Transition [facility] CM/SW Contact:    Elease Hashimoto, LCSW Phone Number: 01/27/2020, 2:32 PM  Clinical Narrative:   Met with pt and son when he came in to visit to discuss discharge plans. Pt realizes she has balance issues and is not able to ambulate on her own she is in agreement with going to rehab for a short time. Son is in agreement with this plan. Pt does need to be mod/i with her walker before going home since she will be alone while her works. She recently moved from [facility] to stay with her son. She has five other children who are supportive all are involved but live spread out over the [place]. Pt aware with her Medicaid it may take 4-5 days to get approval for rehab. Will begin process of SNF, FL2 and bed search. Will continue to work on SNF bed.                Expected Discharge Plan: Skilled Nursing Facility Barriers to Discharge: Continued Medical Work up   Patient Goals and CMS Choice Patient states their goals for this hospitalization and ongoing recovery are:: I need to go to rehab and get better, I'm not back to where I was. CMS Medicare.gov Compare Post Acute Care list provided to:: Patient Choice offered to / list presented to : Patient  Expected Discharge Plan and Services Expected Discharge Plan: [place] In-house Referral: Clinical Social Work   Post Acute Care Choice: [place] Living arrangements for the past 2 months: Apartment                                      Prior Living Arrangements/Services Living arrangements for the past 2 months: Apartment Lives with:: Adult Children Patient language and need for interpreter reviewed:: No Do you feel safe going back to the place where you live?: Yes      Need for Family Participation in Patient Care: Yes (Comment) Care giver support  system in place?: Yes (comment)   Criminal Activity/Legal Involvement Pertinent to Current Situation/Hospitalization: No - Comment as needed  Activities of Daily Living Home Assistive Devices/Equipment: CBG Meter ADL Screening (condition at time of admission) Patient's cognitive ability adequate to safely complete daily activities?: Yes Is the patient deaf or have difficulty hearing?: No Does the patient have difficulty seeing, even when wearing glasses/contacts?: No Does the patient have difficulty concentrating, remembering, or making decisions?: No Patient able to express need for assistance with ADLs?: Yes Does the patient have difficulty dressing or bathing?: No Independently performs ADLs?: Yes (appropriate for developmental age) Does the patient have difficulty walking or climbing stairs?: No Weakness of Legs: Both Weakness of Arms/Hands: None  Permission Sought/Granted Permission sought to share information with : Family Supports, Chartered certified accountant granted to share information with : Yes, Verbal Permission Granted  Share Information with NAME: Ka-lid  Permission granted to share info w AGENCY: SNF  Permission granted to share info w Relationship: son  Permission granted to share info w Contact Information: Admission Coordinator  Emotional Assessment Appearance:: Appears stated age Attitude/Demeanor/Rapport: Engaged Affect (typically observed): Adaptable, Accepting Orientation: : Oriented to Self, Oriented to Place, Oriented to Situation, Oriented to  Time   Psych Involvement: No (comment)  Admission  diagnosis:  DKA (diabetic ketoacidoses) ([place]) [E11.10] Diabetic ketoacidosis with coma associated with type 2 diabetes mellitus ([place]) [E11.11] Patient Active Problem List   Diagnosis Date Noted  . DKA (diabetic ketoacidoses) ([place]) 01/20/2020   PCP:  [facility] [place] Pharmacy:   [id number] [place], [place] - [address] [place] [address] Phone: [phone] Fax: [phone]     Social Determinants of Health (SDOH) Interventions    Readmission Risk Interventions No flowsheet data found.

## 2020-01-27 NOTE — Progress Notes (Signed)
PROGRESS NOTE    Natalie [name]  ELF:[id number] DOB: Feb 16, 1961 DOA: 01/20/2020 PCP: [facility]    Brief Narrative:  59 year old female with history of type 2 diabetes, bipolar disorder, apparently on Trulicity and Jardiance, recently not taking medicine because they were too expensive presented to the emergency room on 8/3 with unresponsiveness and hypoglycemia.  Patient recently moved from [place], no insurance here and has been out of insulin for some time.  EMS found her with saturation 70% requiring nonrebreather mask, blood glucose 570.  Patient was encephalopathic with pinpoint pupils.  In the ER, she received dose of Narcan with improvement in mentation.  Insulin GTT started and admitted to ICU.  08/4: Pt admitted to ICU with DKA 8/5 severe DKA and unresponsive state.  Intubated. 8/6- DKA regimen reviewed with Diabetic coordinator and adjusted, patient remains unresponsive despite no sedation. 01/24/20 - Patient medically optimized with resolution of DKA , s/p liberation from Mechanical intubation. Care plan reviewed with son at bedside.  01/25/20-extubated.  Transfer to triad care.  Assessment & Plan:   Active Problems:   DKA (diabetic ketoacidoses) ([place])  Diabetic ketoacidosis: With history of insulin-dependent diabetes. Not taken any treatment since 3 months.  Prescribed Trulicity and Jardiance that she is not able to afford.  hemoglobin A1c 11.7. Treated with insulin drip with improvement.  Converted to subcu insulin. Patient can be treated with conventional insulin that is covered by her Medicaid.   Change to Lantus 12 units twice a day, NovoLog 4 units with meals and sliding scale insulin.  This will be her discharge treatment.  We will also add Metformin on discharge.  Patient was prescribed Trulicity and Jardiance which are very expensive medications, will prescribe generic insulin on discharge.  Acute hypoxemic respiratory failure secondary  to DKA, metabolic encephalopathy: Improved.  On room air now.  Continue to monitor.  Mobilize.  Continue incentive spirometry. Patient treated for aspiration pneumonia/community-acquired pneumonia.  Treated with vancomycin and Zosyn.  Discontinue vancomycin.  Change Zosyn to Augmentin for total 7 days.   Altered mental status with encephalopathy: Resolved.  Due to DKA.  CT head was negative.  Acute renal failure: Improved.  Normalized.  Foley removed.  Hypernatremia: Improved.  Encourage oral liquid intake.   Bipolar disorder: On multiple medications including Celexa, Geodon, Cogentin, Ativan at home.  Verified with family.  Start today.  Ambulate with PT OT.  Refer to a skilled nursing facility for rehab.   DVT prophylaxis: SCDs Start: 01/22/20 1404 heparin injection 5,000 Units Start: 01/21/20 0400   Code Status: Full code. Family Communication: Son on the phone. Disposition Plan: Status is: Inpatient  Remains inpatient appropriate because:Persistent severe electrolyte disturbances and IV treatments appropriate due to intensity of illness or inability to take PO   Dispo: The patient is from: Home              Anticipated d/c is to: Skilled nursing facility.              Anticipated d/c date is: Architectural technologist.              Patient currently is not medically stable to d/c.         Consultants:   PCCM  Procedures:   Mechanical ventilation  Antimicrobials:  Antibiotics Given (last 72 hours)    Date/Time Action Medication Dose Rate   01/24/20 1628 New Bag/Given   piperacillin-tazobactam (ZOSYN) IVPB 3.375 g 3.375 g 12.5 mL/hr   01/24/20 2151 New Bag/Given  piperacillin-tazobactam (ZOSYN) IVPB 3.375 g 3.375 g 12.5 mL/hr   01/25/20 0526 New Bag/Given   vancomycin (VANCOCIN) IVPB 1000 mg/200 mL premix 1,000 mg 200 mL/hr   01/25/20 0644 New Bag/Given   piperacillin-tazobactam (ZOSYN) IVPB 3.375 g 3.375 g 12.5 mL/hr   01/25/20 1425 New Bag/Given   piperacillin-tazobactam  (ZOSYN) IVPB 3.375 g 3.375 g 12.5 mL/hr   01/25/20 2139 New Bag/Given   piperacillin-tazobactam (ZOSYN) IVPB 3.375 g 3.375 g 12.5 mL/hr   01/26/20 0555 New Bag/Given   piperacillin-tazobactam (ZOSYN) IVPB 3.375 g 3.375 g 12.5 mL/hr   01/26/20 1305 New Bag/Given   piperacillin-tazobactam (ZOSYN) IVPB 3.375 g 3.375 g 12.5 mL/hr   01/26/20 2319 New Bag/Given   piperacillin-tazobactam (ZOSYN) IVPB 3.375 g 3.375 g 12.5 mL/hr   01/27/20 0542 New Bag/Given   piperacillin-tazobactam (ZOSYN) IVPB 3.375 g 3.375 g 12.5 mL/hr   01/27/20 1024 Given   amoxicillin-clavulanate (AUGMENTIN) 875-125 MG per tablet 1 tablet 1 tablet          Subjective: Seen and examined.  No overnight events.  Denies any nausea vomiting.  Blood sugars remained stable.  She was on room air.  Able to communicate well.  Objective: Vitals:   01/27/20 0424 01/27/20 0723 01/27/20 0832 01/27/20 1100  BP: (!) 144/62 98/63 (!) 142/83 115/78  Pulse: 78 89 98 87  Resp: 17 18 16 16   Temp: 99.1 F (37.3 C) 99.2 F (37.3 C) 99.4 F (37.4 C) 99 F (37.2 C)  TempSrc: Oral Oral Oral Oral  SpO2: 100% 94% 99% 100%  Weight:      Height:        Intake/Output Summary (Last 24 hours) at 01/27/2020 1315 Last data filed at 01/27/2020 0959 Gross per 24 hour  Intake 577.48 ml  Output 1350 ml  Net -772.52 ml   Filed Weights   01/23/20 0500 01/25/20 0500 01/26/20 0417  Weight: 93.3 kg 92.5 kg 93.6 kg    Examination:  General exam: Appears calm and comfortable , chronically sick looking.  Not in any distress. Respiratory system: Clear to auscultation.  Some conducted airway sounds. Cardiovascular system: S1 & S2 heard, RRR. No JVD, murmurs, rubs, gallops or clicks. No pedal edema. Gastrointestinal system: Abdomen is nondistended, soft and nontender. No organomegaly or masses felt. Normal bowel sounds heard. Central nervous system: Alert and oriented. No focal neurological deficits. Extremities: Symmetric 5 x 5 power. Skin: No  rashes, lesions or ulcers Psychiatry: Judgement and insight appear normal. Mood & affect normal.    Data Reviewed: I have personally reviewed following labs and imaging studies  CBC: Recent Labs  Lab 01/20/20 2216 01/21/20 0325 01/22/20 0415 01/23/20 0323 01/26/20 0531  WBC 15.1* 13.6* 5.1 4.5 5.2  HGB 12.9 13.4 9.8* 9.8* 8.9*  HCT 40.1 40.6 26.5* 27.0* 27.4*  MCV 99.5 96.9 89.5 90.0 97.5  PLT 231 169 89* 68* 283   Basic Metabolic Panel: Recent Labs  Lab 01/22/20 0002 01/22/20 0415 01/23/20 0323 01/23/20 0717 01/24/20 0704 01/24/20 1140 01/24/20 1555 01/24/20 2017 01/25/20 0837 01/25/20 1241 01/26/20 0531 01/27/20 0440  NA 137   < > 138   < > 146*   < > 144 144 146*  --  150* 144  K 3.5   < > 4.2   < > 3.9   < > 4.4 4.4 4.0  --  3.5 3.0*  CL 100   < > 102   < > 107   < > 106 104 106  --  111  104  CO2 22   < > 26   < > 26   < > 22 26 30   --  32 28  GLUCOSE 312*   < > 330*   < > 141*   < > 305* 378* 206*  --  161* 219*  BUN 45*   < > 37*   < > 32*   < > 35* 38* 41*  --  31* 20  CREATININE 3.35*   < > 2.83*   < > 2.56*   < > 2.45* 2.38* 2.17*  --  1.42* 1.27*  CALCIUM 8.3*   < > 7.6*   < > 8.3*   < > 8.1* 7.8* 8.1*  --  8.5* 8.6*  MG 2.2  --  1.8  --  1.7  --   --   --   --  1.5* 2.3 1.8  PHOS <1.0*  --  <1.0*  --  1.8*  --   --   --   --  3.1 3.2  --    < > = values in this interval not displayed.   GFR: Estimated Creatinine Clearance: 53.2 mL/min (A) (by C-G formula based on SCr of 1.27 mg/dL (H)). Liver Function Tests: Recent Labs  Lab 01/20/20 2216 01/26/20 0531  AST 26  --   ALT 23  --   ALKPHOS 89  --   BILITOT 2.5*  --   PROT 7.6  --   ALBUMIN 4.2 2.6*   No results for input(s): LIPASE, AMYLASE in the last 168 hours. No results for input(s): AMMONIA in the last 168 hours. Coagulation Profile: No results for input(s): INR, PROTIME in the last 168 hours. Cardiac Enzymes: No results for input(s): CKTOTAL, CKMB, CKMBINDEX, TROPONINI in the last 168  hours. BNP (last 3 results) No results for input(s): PROBNP in the last 8760 hours. HbA1C: No results for input(s): HGBA1C in the last 72 hours. CBG: Recent Labs  Lab 01/26/20 2118 01/27/20 0106 01/27/20 0419 01/27/20 0725 01/27/20 1104  GLUCAP 241* 244* 213* 152* 221*   Lipid Profile: Recent Labs    01/24/20 1555  TRIG 315*   Thyroid Function Tests: No results for input(s): TSH, T4TOTAL, FREET4, T3FREE, THYROIDAB in the last 72 hours. Anemia Panel: No results for input(s): VITAMINB12, FOLATE, FERRITIN, TIBC, IRON, RETICCTPCT in the last 72 hours. Sepsis Labs: Recent Labs  Lab 01/20/20 2217 01/21/20 0045 01/21/20 0325 01/23/20 1135 01/24/20 0704 01/25/20 0837  PROCALCITON  --   --  2.20 3.76 >150.00 136.13  LATICACIDVEN 3.5* 2.0*  --   --   --   --     Recent Results (from the past 240 hour(s))  SARS Coronavirus 2 by RT PCR (hospital order, performed in [facility] hospital lab) Nasopharyngeal Nasopharyngeal Swab     Status: None   Collection Time: 01/21/20 12:45 AM   Specimen: Nasopharyngeal Swab  Result Value Ref Range Status   SARS Coronavirus 2 NEGATIVE NEGATIVE Final    Comment: (NOTE) SARS-CoV-2 target nucleic acids are NOT DETECTED.  The SARS-CoV-2 RNA is generally detectable in upper and lower respiratory specimens during the acute phase of infection. The lowest concentration of SARS-CoV-2 viral copies this assay can detect is 250 copies / mL. A negative result does not preclude SARS-CoV-2 infection and should not be used as the sole basis for treatment or other patient management decisions.  A negative result may occur with improper specimen collection / handling, submission of specimen other than nasopharyngeal swab,  presence of viral mutation(s) within the areas targeted by this assay, and inadequate number of viral copies (<250 copies / mL). A negative result must be combined with clinical observations, patient history, and epidemiological  information.  Fact Sheet for Patients:   StrictlyIdeas.no  Fact Sheet for Healthcare Providers: BankingDealers.co.za  This test is not yet approved or  cleared by the [place] FDA and has been authorized for detection and/or diagnosis of SARS-CoV-2 by FDA under an Emergency Use Authorization (EUA).  This EUA will remain in effect (meaning this test can be used) for the duration of the COVID-19 declaration under Section 564(b)(1) of the Act, 21 U.S.C. section 360bbb-3(b)(1), unless the authorization is terminated or revoked sooner.  Performed at [facility], [address] [place], [place] [address]   CULTURE, BLOOD (ROUTINE X 2) w Reflex to ID Panel     Status: None   Collection Time: 01/21/20  3:24 AM   Specimen: BLOOD RIGHT FOREARM  Result Value Ref Range Status   Specimen Description BLOOD RIGHT FOREARM  Final   Special Requests   Final    BOTTLES DRAWN AEROBIC AND ANAEROBIC Blood Culture adequate volume   Culture   Final    NO GROWTH 5 DAYS Performed at [facility], [address] [place], [place] [address]    Report Status 01/26/2020 FINAL  Final  CULTURE, BLOOD (ROUTINE X 2) w Reflex to ID Panel     Status: None   Collection Time: 01/21/20  3:24 AM   Specimen: BLOOD  Result Value Ref Range Status   Specimen Description BLOOD LEFT ANTECUBITAL  Final   Special Requests   Final    BOTTLES DRAWN AEROBIC AND ANAEROBIC Blood Culture adequate volume   Culture   Final    NO GROWTH 5 DAYS Performed at [facility], [address] [place], [place] [address]    Report Status 01/26/2020 FINAL  Final  Urine Culture     Status: None   Collection Time: 01/21/20  5:06 AM   Specimen: Urine, Random  Result Value Ref Range Status   Specimen Description   Final    URINE, RANDOM Performed at [facility], [address] [place], [place] [address]    Special Requests   Final     NONE Performed at [facility], [address], [place], [place] [address]    Culture   Final    NO GROWTH Performed at [facility], [place] [address] [place], [place] [address]    Report Status 01/22/2020 FINAL  Final  Culture, respiratory (non-expectorated)     Status: None   Collection Time: 01/23/20  6:00 PM   Specimen: Tracheal Aspirate; Respiratory  Result Value Ref Range Status   Specimen Description   Final    TRACHEAL ASPIRATE Performed at [facility], [address], [place], [place] [address]    Special Requests   Final    NONE Performed at [facility], [address] [place], [place] [address]    Gram Stain   Final    FEW WBC PRESENT, PREDOMINANTLY PMN MODERATE GRAM NEGATIVE RODS FEW GRAM POSITIVE COCCI RARE GRAM POSITIVE RODS    Culture   Final    RARE Normal respiratory flora-no Staph aureus or Pseudomonas seen Performed at [facility], [address] [place], [place] [address]    Report Status 01/26/2020 FINAL  Final  Culture, blood (routine x 2)  Status: None (Preliminary result)   Collection Time: 01/23/20  6:45 PM   Specimen: BLOOD  Result Value Ref Range Status   Specimen Description BLOOD BLOOD LEFT HAND  Final   Special Requests   Final    BOTTLES DRAWN AEROBIC AND ANAEROBIC Blood Culture adequate volume   Culture   Final    NO GROWTH 4 DAYS Performed at [facility], [address], [place], [place] [address]    Report Status PENDING  Incomplete  Culture, blood (routine x 2)     Status: None (Preliminary result)   Collection Time: 01/23/20  7:56 PM   Specimen: BLOOD  Result Value Ref Range Status   Specimen Description BLOOD BLOOD RIGHT HAND  Final   Special Requests   Final    BOTTLES DRAWN AEROBIC AND ANAEROBIC Blood Culture results may not be optimal due to an excessive volume of blood received in culture bottles   Culture   Final    NO GROWTH 4 DAYS Performed at [facility], [address], [place], [place] [address]    Report Status PENDING  Incomplete  MRSA PCR Screening     Status: None   Collection Time: 01/26/20  8:42 AM   Specimen: Nasal Mucosa; Nasopharyngeal  Result Value Ref Range Status   MRSA by PCR NEGATIVE NEGATIVE Final    Comment:        The GeneXpert MRSA Assay (FDA approved for NASAL specimens only), is one component of a comprehensive MRSA colonization surveillance program. It is not intended to diagnose MRSA infection nor to guide or monitor treatment for MRSA infections. Performed at [facility], [address], [place], [place] [address]          Radiology Studies: [facility] Chest [place] 1 View  Result Date: 01/26/2020 CLINICAL DATA:  Acute respiratory failure. EXAM: PORTABLE CHEST 1 VIEW COMPARISON:  01/21/2020. FINDINGS: Mild cardiac enlargement. No pleural effusion or edema. Diffuse, chronic interstitial coarsening noted bilaterally. Left midlung nodular density is persistent when compared with previous exam. IMPRESSION: 1. Persistent left midlung nodular density. Further investigation with contrast enhanced CT of the chest is advised to assess for underlying malignancy. 2. No new findings. Electronically Signed   By: Kerby Moors M.D.   On: 01/26/2020 07:50        Scheduled Meds: . amoxicillin-clavulanate  1 tablet Oral Q12H  . atorvastatin  40 mg Oral QHS  . benztropine  1 mg Oral Daily  . Chlorhexidine Gluconate Cloth  6 each Topical Daily  . citalopram  20 mg Per Tube Daily  . docusate  100 mg Oral BID  . fluticasone furoate-vilanterol  1 puff Inhalation Daily   And  . umeclidinium bromide  1 puff Inhalation Daily  . heparin  5,000 Units Subcutaneous Q8H  . insulin aspart  0-5 Units Subcutaneous QHS  . insulin aspart  0-9 Units Subcutaneous TID WC  . insulin aspart  4 Units Subcutaneous TID WC  . insulin glargine  12 Units Subcutaneous BID  . latanoprost  1 drop Both Eyes QHS  . LORazepam  1 mg Per  Tube BID  . magnesium oxide  400 mg Oral BID  . multivitamin with minerals  1 tablet Oral Daily  . polyethylene glycol  17 g Oral Daily  . potassium chloride  40 mEq Oral BID  . sodium chloride flush  10-40 mL Intracatheter Q12H  . ziprasidone  40 mg Oral QHS   Continuous Infusions: . sodium chloride Stopped (  01/22/20 1720)     LOS: 7 days    Time spent: 30 minutes    Barb Merino, MD [facility] Pager [phone]

## 2020-01-27 NOTE — Progress Notes (Signed)
[place] for Electrolyte Monitoring and Replacement   Recent Labs: Potassium (mmol/L)  Date Value  01/27/2020 3.0 (L)   Magnesium (mg/dL)  Date Value  01/27/2020 1.8   Calcium (mg/dL)  Date Value  01/27/2020 8.6 (L)   Albumin (g/dL)  Date Value  01/26/2020 2.6 (L)   Phosphorus (mg/dL)  Date Value  01/26/2020 3.2   Sodium (mmol/L)  Date Value  01/27/2020 144   Corrected Ca: 9.6 mg/dL  Assessment: 59 year old female admitted to the ICU intubated and sedated with DKA on insulin drip which has since resolved and transitioned to subcutaneous insulin. now off bicarbonate drip with resolution of acidosis. Pharmacy to manage electrolytes.  Goal of Therapy:  Electrolytes WNL  Plan:   K 3.0 - MD has ordered KCl 51mEq PO BID x 4  Re-check electrolytes with morning labs  Paulina Fusi, PharmD, BCPS 01/27/2020 2:51 PM

## 2020-01-27 NOTE — Progress Notes (Signed)
Patient was being assisted to bathroom and started feeling weak. Was helped to floor by nursing assistant. Family and MD notified. VSS. Will continue to monitor.

## 2020-01-27 NOTE — NC FL2 (Signed)
[place] LEVEL OF CARE SCREENING TOOL     IDENTIFICATION  Patient Name: Natalie [name] Birthdate: 26-Jan-1961 Sex: female Admission Date (Current Location): 01/20/2020  [place] and [place] Number:  Selena Lesser [id number] Facility and Address:  [facility], [address] [place], [place] [address]      Provider Number: [id number]  Attending Physician Name and Address:  Barb Merino, MD  Relative Name and Phone Number:  Briteny Fulghum [phone]    Current Level of Care: Hospital Recommended Level of Care: [place] Prior Approval Number:    Date Approved/Denied:   PASRR Number: [id number]  Discharge Plan: SNF    Current Diagnoses: Patient Active Problem List   Diagnosis Date Noted  . DKA (diabetic ketoacidoses) ([place]) 01/20/2020    Orientation RESPIRATION BLADDER Height & Weight     Self, Situation, Place  Normal Indwelling catheter Weight: 206 lb 5.6 oz (93.6 kg) Height:  5\' 5"  (165.1 cm)  BEHAVIORAL SYMPTOMS/MOOD NEUROLOGICAL BOWEL NUTRITION STATUS      Continent Diet (Dys 3 thin liquids)  AMBULATORY STATUS COMMUNICATION OF NEEDS Skin   Extensive Assist Verbally Normal                       Personal Care Assistance Level of Assistance  Bathing, Dressing Bathing Assistance: Limited assistance   Dressing Assistance: Limited assistance     Functional Limitations Info  Sight Sight Info: Impaired        SPECIAL CARE FACTORS FREQUENCY  PT (By licensed PT), OT (By licensed OT), Speech therapy     PT Frequency: 5x week OT Frequency: 5x week     Speech Therapy Frequency: 3x week      Contractures Contractures Info: Not present    Additional Factors Info  Code Status, Allergies Code Status Info: Full Code Allergies Info: NKDA           Current Medications (01/27/2020):  This is the current hospital active medication list Current Facility-Administered Medications   Medication Dose Route Frequency Provider Last Rate Last Admin  . 0.9 %  sodium chloride infusion  250 mL Intravenous Continuous Awilda Bill, NP   Stopped at 01/22/20 1720  . acetaminophen (TYLENOL) tablet 650 mg  650 mg Oral Q4H PRN Ottie Glazier, MD   650 mg at 01/24/20 1038  . amoxicillin-clavulanate (AUGMENTIN) 875-125 MG per tablet 1 tablet  1 tablet Oral Q12H Barb Merino, MD   1 tablet at 01/27/20 1024  . atorvastatin (LIPITOR) tablet 40 mg  40 mg Oral QHS Barb Merino, MD      . benztropine (COGENTIN) tablet 1 mg  1 mg Oral Daily Barb Merino, MD   1 mg at 01/27/20 1141  . Chlorhexidine Gluconate Cloth 2 % PADS 6 each  6 each Topical Daily Flora Lipps, MD   6 each at 01/27/20 [phone]  . citalopram (CELEXA) tablet 20 mg  20 mg Per Tube Daily Flora Lipps, MD   20 mg at 01/27/20 0840  . docusate (COLACE) 50 MG/5ML liquid 100 mg  100 mg Oral BID Awilda Bill, NP   100 mg at 01/27/20 0841  . fluticasone furoate-vilanterol (BREO ELLIPTA) 100-25 MCG/INH 1 puff  1 puff Inhalation Daily Barb Merino, MD   1 puff at 01/27/20 1337   And  . umeclidinium bromide (INCRUSE ELLIPTA) 62.5 MCG/INH 1 puff  1 puff Inhalation Daily Barb Merino, MD   1 puff at 01/27/20 1337  . heparin injection 5,000 Units  5,000 Units Subcutaneous Q8H Awilda Bill, NP   5,000 Units at 01/27/20 1338  . insulin aspart (novoLOG) injection 0-5 Units  0-5 Units Subcutaneous QHS Ghimire, Dante Gang, MD      . insulin aspart (novoLOG) injection 0-9 Units  0-9 Units Subcutaneous TID WC Ghimire, Kuber, MD      . insulin aspart (novoLOG) injection 4 Units  4 Units Subcutaneous TID WC Ghimire, Kuber, MD      . insulin glargine (LANTUS) injection 12 Units  12 Units Subcutaneous BID Ghimire, Dante Gang, MD      . latanoprost (XALATAN) 0.005 % ophthalmic solution 1 drop  1 drop Both Eyes QHS Ghimire, Kuber, MD      . LORazepam (ATIVAN) tablet 1 mg  1 mg Per Tube BID Flora Lipps, MD   1 mg at 01/27/20 0840  . magnesium oxide  (MAG-OX) tablet 400 mg  400 mg Oral BID Barb Merino, MD   400 mg at 01/27/20 0842  . multivitamin with minerals tablet 1 tablet  1 tablet Oral Daily Barb Merino, MD   1 tablet at 01/27/20 1141  . ondansetron (ZOFRAN) injection 4 mg  4 mg Intravenous Q6H PRN Awilda Bill, NP   4 mg at 01/21/20 0021  . polyethylene glycol (MIRALAX / GLYCOLAX) packet 17 g  17 g Oral Daily Awilda Bill, NP   17 g at 01/27/20 0844  . potassium chloride SA (KLOR-CON) CR tablet 40 mEq  40 mEq Oral BID Barb Merino, MD   40 mEq at 01/27/20 0845  . [START ON 01/28/2020] protein supplement (ENSURE MAX) liquid  11 oz Oral Daily Ghimire, Dante Gang, MD      . sodium chloride flush (NS) 0.9 % injection 10-40 mL  10-40 mL Intracatheter Q12H Flora Lipps, MD   10 mL at 01/26/20 0906  . sodium chloride flush (NS) 0.9 % injection 10-40 mL  10-40 mL Intracatheter PRN Flora Lipps, MD      . ziprasidone (GEODON) capsule 40 mg  40 mg Oral QHS Barb Merino, MD         Discharge Medications: Please see discharge summary for a list of discharge medications.  Relevant Imaging Results:  Relevant Lab Results:   Additional Information SNN: [id number]  Caasi Giglia, Gardiner Rhyme, LCSW

## 2020-01-27 NOTE — Progress Notes (Signed)
Inpatient Diabetes Program Recommendations  AACE/ADA: New Consensus Statement on Inpatient Glycemic Control   Target Ranges:  Prepandial:   less than 140 mg/dL      Peak postprandial:   less than 180 mg/dL (1-2 hours)      Critically ill patients:  140 - 180 mg/dL   Results for Hergert, Aldea (MRN [id number]) as of 01/27/2020 10:02  Ref. Range 01/26/2020 07:15 01/26/2020 11:47 01/26/2020 16:43 01/26/2020 21:18 01/27/2020 01:06 01/27/2020 04:19 01/27/2020 07:25  Glucose-Capillary Latest Ref Range: 70 - 99 mg/dL 144 (H) 179 (H) 190 (H) 241 (H) 244 (H) 213 (H) 152 (H)   Review of Glycemic Control  Diabetes history:DM2 Outpatient Diabetes medications:Trulicity 1.5 mg Qweek, Jardiance 25 mg daily, Metformin 500 mg BID(out of DM medications since May) Current orders for Inpatient glycemic control: Lantus 10 units BID, Novolog 0-15 units Q4H  Inpatient Diabetes Program Recommendations:    HbgA1C:  A1C 11.7% on 01/21/20 indicating an average glucose of 289 mg/dl over the past 2-3 months. Based on glucose trends, would recommend discharging patient on Lantus 13 units BID, Novolog 4 units TID with meals, and Novolog 0-9 units TID with meals for correction.  NOTE: Since Trulicity and Jardiance require pre-authorization and unsure how long that may take, MD plans to discharge patient on insulin regimen. Will plan to talk with patient today regarding discharging new to insulin.   Spoke with patient again about DM and likely being discharged on insulin. Patient was using Trulicity pens so she is familiar with self administering SQ injection. Discussed that since Trulicity and Jardiance require pre-authorization, MD feels it would be best to discharge on an insulin regimen to ensure she is able to get medication for DM management at time of discharge. Discussed Lantus and Novolog insulin and how each works for DM control. Discussed how to store insulin, injection sites, and importance of rotating injection  sites. Educated patient  on insulin pen use at home. Reviewed contents of insulin flexpen starter kit. Reviewed all steps of insulin pen including attachment of needle, 2-unit air shot, dialing up dose, giving injection, removing needle, disposal of sharps, storage of unused insulin, disposal of insulin etc. Patient able to provide successful return demonstration. Reviewed hypoglycemia along with proper treatment as well. Patient notes that she has experienced hypoglycemia in the past and she is able to recognize if her glucose is getting too low. Encouraged patient to follow up with [facility] regarding DM control. Patient verbalized understanding of information discussed and states that she has no questions at this time. If discharged on insulin, will need Rx for: Lantus SoloStar (#[id number]), Novolog Flexpen (#[id number]), and insulin pen needles (#[id number]).    Thanks, Marie , RN, MSN, CDE Diabetes Coordinator Inpatient Diabetes Program [phone] (Team Pager from 8am to 5pm)   

## 2020-01-27 NOTE — Progress Notes (Addendum)
Speech Language Pathology Treatment: Dysphagia  Patient Details Name: Natalie [name] MRN: [id number] DOB: 1960-10-26 Today's Date: 01/27/2020 Time: 4315-4008 SLP Time Calculation (min) (ACUTE ONLY): 30 min  Assessment / Plan / Recommendation Clinical Impression  Pt seen for ongoing assessment of swallowing. She appears much improved and alert today; more verbally responsive and followed instructions w/ min cues; slight ease for distraction but re-attended w/ verbal cues. Pt is on RA; wbc wnl.  Pt explained general aspiration precautions and agreed verbally to the need for following them especially sitting upright for all oral intake and drinking liquids Slowly. Pt assisted w/ setup then given trials of thin liquids and purees. No overt clinical s/s of aspiration were noted w/ consistencies/trials; respiratory status remained calm and unlabored, vocal quality clear b/t trials. Pt fed self and followed through w/ instruction for single, small sips slowly. Straw trials then followed w/ thin liquids; noted use in room. No overt s/s of aspiration noted w/ straw use. Oral phase appeared grossly [facility] for bolus management and timely A-P transfer for swallowing; oral clearing achieved w/ all consistencies.  Recommend continue a Dysphagia level 3 diet (mech soft) w/ gravies added to moisten foods; Thin liquids. Recommend general aspiration precautions; Pills Whole in Puree for cohesiveness and safer swallowing; tray setup and positioning assistance for meals as needed. No further skilled ST services indicated currently; NSG to reconsult if any new needs arise while admitted. Precautions posted at bedside. NSG agreed.    HPI HPI: Pt is a 59 yo female with a PMH of Type II Diabetes Mellitus and Bipolar disorder(on multiple medications including Celexa, Geodon, Cogentin, Ativan at home) who presented to [facility] ER on 08/3 with unresponsiveness and hyperglycemia.  Per ER notes pts son reported the pt recently moved  from [place], has not obtained insurance here, and has been out of her insulin for days.  EMS reported pt hypoxic on RA with O2 sats in the 70's requiring NRB and CBG 570.  EMS also had concern of possible polysubstance abuse due to suspected track marks on pts arms.  Upon arrival to the ER pt remained encephalopathic with pinpoint pupils.  Therefore, she received 1 mg of iv narcan x1 dose with improvement in mentation. Pt also noted to have Kussmaul respirations, lab result ruled pt in for DKA.  Insulin gtt initiated.  Pt was orally intubated/extubated d/t complications of DKA.  Pt was admitted w/ DKA (diabetic ketoacidoses), insulin-dependent diabetes but Not taken any treatment since 3 months d/t being unable to afford the medications per her report.       SLP Plan  All goals met       Recommendations  Diet recommendations: Dysphagia 3 (mechanical soft);Thin liquid Liquids provided via: Cup;Straw Medication Administration: Whole meds with puree (for safer swallowing) Supervision: Patient able to self feed;Intermittent supervision to cue for compensatory strategies (min -- slow down when drinking) Compensations: Small sips/bites;Slow rate;Minimize environmental distractions;Lingual sweep for clearance of pocketing;Multiple dry swallows after each bite/sip;Follow solids with liquid Postural Changes and/or Swallow Maneuvers: Seated upright 90 degrees;Upright 30-60 min after meal                General recommendations:  (Dietician f/u as needed) Oral Care Recommendations: Oral care BID;Oral care before and after PO;Staff/trained caregiver to provide oral care Follow up Recommendations: None SLP Visit Diagnosis: Dysphagia, unspecified (R13.10) (missing Dentition; sore Mouth) Plan: All goals met       GO  Natalie [name], [place], CCC-SLP Natalie [name] 01/27/2020, 3:40 PM

## 2020-01-27 NOTE — Progress Notes (Signed)
Nutrition Follow-up  DOCUMENTATION CODES:   Obesity unspecified  INTERVENTION:  Provide Ensure Max Protein po once daily, each supplement provides 150 kcal and 30 grams of protein.  NUTRITION DIAGNOSIS:   Inadequate oral intake related to inability to eat as evidenced by NPO status.  Resolving - diet now advanced.  GOAL:   Patient will meet greater than or equal to 90% of their needs  Progressing.  MONITOR:   Vent status, Labs, Weight trends, TF tolerance, I & O's  REASON FOR ASSESSMENT:   Ventilator, Consult Enteral/tube feeding initiation and management  ASSESSMENT:   59 year old female with PMHx of DM admitted with DKA, AKI, and acute respiratory failure secondary to metabolic acidosis requiring intubation.  8/7 extubated 8/9 diet advanced to dysphagia 3 with thin liquids following SLP evaluation  Met with patient at bedside. She reports her appetite is improving. She ate 60% of her dinner yesterday and was able to eat 100% of both breakfast and lunch today. Discussed importance of adequate protein intake. Patient is amenable to drinking Ensure max Protein once daily to help meet protein needs.  Medications reviewed and include: Augmentin, Colace 100 mg BID, Novolog 0-9 units TID, Novolog 4 units TID, Novolog 0-5 units QHS, Lantus 12 untis BID, Ativan, magnesium oxide 400 mg BID, MVI daily, Miralax, potassium chloride 40 mEq BID.  Labs reviewed: CBG 152-244, Potassium 3, Creatinine 1.27.  Diet Order:   Diet Order            DIET DYS 3 Room service appropriate? Yes with Assist; Fluid consistency: Thin  Diet effective now                EDUCATION NEEDS:   No education needs have been identified at this time  Skin:  Skin Assessment: Reviewed RN Assessment  Last BM:  01/27/2020 - small type 5  Height:   Ht Readings from Last 1 Encounters:  01/23/20 5' 5"  (1.651 m)   Weight:   Wt Readings from Last 1 Encounters:  01/26/20 93.6 kg   Ideal Body Weight:   56.8 kg  BMI:  Body mass index is 34.34 kg/m.  Estimated Nutritional Needs:   Kcal:  1800-2000  Protein:  100-115 grams  Fluid:  2 L/day  Jacklynn Barnacle, MS, RD, LDN Pager number available on Amion

## 2020-01-28 LAB — CULTURE, BLOOD (ROUTINE X 2)
Culture: NO GROWTH
Culture: NO GROWTH
Special Requests: ADEQUATE

## 2020-01-28 LAB — BASIC METABOLIC PANEL
Anion gap: 9 (ref 5–15)
BUN: 11 mg/dL (ref 6–20)
CO2: 29 mmol/L (ref 22–32)
Calcium: 8.5 mg/dL — ABNORMAL LOW (ref 8.9–10.3)
Chloride: 110 mmol/L (ref 98–111)
Creatinine, Ser: 0.95 mg/dL (ref 0.44–1.00)
GFR calc Af Amer: >60 mL/min (ref >60)
GFR calc non Af Amer: >60 mL/min (ref >60)
Glucose, Bld: 172 mg/dL — ABNORMAL HIGH (ref 70–99)
Potassium: 3.6 mmol/L (ref 3.5–5.1)
Sodium: 148 mmol/L — ABNORMAL HIGH (ref 135–145)

## 2020-01-28 LAB — GLUCOSE, CAPILLARY
Glucose-Capillary: 154 mg/dL — ABNORMAL HIGH (ref 70–99)
Glucose-Capillary: 234 mg/dL — ABNORMAL HIGH (ref 70–99)
Glucose-Capillary: 193 mg/dL — ABNORMAL HIGH (ref 70–99)
Glucose-Capillary: 271 mg/dL — ABNORMAL HIGH (ref 70–99)

## 2020-01-28 LAB — MAGNESIUM: Magnesium: 1.7 mg/dL (ref 1.7–2.4)

## 2020-01-28 MED ORDER — LORAZEPAM 1 MG PO TABS
1.0000 mg | ORAL_TABLET | Freq: Two times a day (BID) | ORAL | Status: DC
  Administered 2020-01-28 – 2020-01-30 (×4): 1 mg via ORAL
  Filled 2020-01-28 (×4): qty 1

## 2020-01-28 MED ORDER — INSULIN ASPART 100 UNIT/ML ~~LOC~~ SOLN
5.0000 [IU] | Freq: Three times a day (TID) | SUBCUTANEOUS | Status: DC
  Administered 2020-01-28 – 2020-01-29 (×3): 5 [IU] via SUBCUTANEOUS
  Filled 2020-01-28 (×3): qty 1

## 2020-01-28 MED ORDER — ENOXAPARIN SODIUM 40 MG/0.4ML ~~LOC~~ SOLN
40.0000 mg | SUBCUTANEOUS | Status: DC
  Administered 2020-01-28 – 2020-01-29 (×2): 40 mg via SUBCUTANEOUS
  Filled 2020-01-28 (×2): qty 0.4

## 2020-01-28 MED ORDER — CITALOPRAM HYDROBROMIDE 20 MG PO TABS
20.0000 mg | ORAL_TABLET | Freq: Every day | ORAL | Status: DC
  Administered 2020-01-29 – 2020-01-30 (×2): 20 mg via ORAL
  Filled 2020-01-28 (×2): qty 1

## 2020-01-28 NOTE — Progress Notes (Signed)
PROGRESS NOTE    Natalie [name]  WCH:[id number] DOB: [birth year] DOA: 01/20/2020 PCP: [facility]    Brief Narrative:  59 year old female with history of type 2 diabetes, bipolar disorder, apparently on Trulicity and Jardiance, recently not taking medicine because they were too expensive presented to the emergency room on 8/3 with unresponsiveness and hypoglycemia.  Patient recently moved from [place], no insurance here and has been out of insulin for some time.  EMS found her with saturation 70% requiring nonrebreather mask, blood glucose 570.  Patient was encephalopathic with pinpoint pupils.  In the ER, she received dose of Narcan with improvement in mentation.  Insulin GTT started and admitted to ICU.  08/4: Pt admitted to ICU with DKA 8/5 severe DKA and unresponsive state.  Intubated. 8/6- DKA regimen reviewed with Diabetic coordinator and adjusted, patient remains unresponsive despite no sedation. 01/24/20 - Patient medically optimized with resolution of DKA , s/p liberation from Mechanical intubation. Care plan reviewed with son at bedside.  01/25/20-extubated.  Transfer to [facility].  Assessment & Plan:   Active Problems:   DKA (diabetic ketoacidoses) ([place])  Diabetic ketoacidosis: With history of insulin-dependent diabetes. Not taken any treatment since 3 months.  Prescribed Trulicity and Jardiance that she is not able to afford.  hemoglobin A1c 11.7. Treated with insulin drip with improvement.  Converted to subcu insulin. Patient can be treated with conventional insulin that is covered by her Medicaid.   Change to Lantus 12 units twice a day, NovoLog 5 units with meals and sliding scale insulin.  This will be her discharge treatment.  We will also add Metformin on discharge.  Patient was prescribed Trulicity and Jardiance which are very expensive medications, will prescribe generic insulin on discharge.  Acute hypoxemic respiratory failure secondary  to DKA, metabolic encephalopathy: Improved.  On room air now.  Continue to monitor.  Mobilize.  Continue incentive spirometry. Patient treated for aspiration pneumonia/community-acquired pneumonia.  Treated with vancomycin and Zosyn.  Discontinue vancomycin.  Change Zosyn to Augmentin for total 2/7 days.   Altered mental status with encephalopathy: Resolved.  Due to DKA.  CT head was negative.  Acute renal failure: Improved.  Normalized.  Foley removed.  Hypernatremia: Mildly elevated.  Encourage oral liquid intake.   Bipolar disorder: On multiple medications including Celexa, Geodon, Cogentin, Ativan at home.  Verified with family.  Restarted.  Continue to work with PT OT.  Profound physical deconditioning and she will benefit with short-term rehab at a skilled nursing facility before going home to live with her son.     DVT prophylaxis: SCDs Start: 01/22/20 1404 heparin injection 5,000 Units Start: 01/21/20 0400   Code Status: Full code. Family Communication: Son on the phone 8/10. Disposition Plan: Status is: Inpatient  Remains inpatient appropriate because: Unsafe discharge disposition.   Dispo: The patient is from: Home              Anticipated d/c is to: Skilled nursing facility.              Anticipated d/c date is: When bed available.              Patient currently is not medically stable to d/c.         Consultants:   PCCM  Procedures:   Mechanical ventilation  Antimicrobials:  Antibiotics Given (last 72 hours)    Date/Time Action Medication Dose Rate   01/25/20 1425 New Bag/Given   piperacillin-tazobactam (ZOSYN) IVPB 3.375 g 3.375 g 12.5 mL/hr  01/25/20 2139 New Bag/Given   piperacillin-tazobactam (ZOSYN) IVPB 3.375 g 3.375 g 12.5 mL/hr   01/26/20 0555 New Bag/Given   piperacillin-tazobactam (ZOSYN) IVPB 3.375 g 3.375 g 12.5 mL/hr   01/26/20 1305 New Bag/Given   piperacillin-tazobactam (ZOSYN) IVPB 3.375 g 3.375 g 12.5 mL/hr   01/26/20 2319 New  Bag/Given   piperacillin-tazobactam (ZOSYN) IVPB 3.375 g 3.375 g 12.5 mL/hr   01/27/20 0542 New Bag/Given   piperacillin-tazobactam (ZOSYN) IVPB 3.375 g 3.375 g 12.5 mL/hr   01/27/20 1024 Given   amoxicillin-clavulanate (AUGMENTIN) 875-125 MG per tablet 1 tablet 1 tablet    01/27/20 2251 Given   amoxicillin-clavulanate (AUGMENTIN) 875-125 MG per tablet 1 tablet 1 tablet    01/28/20 0951 Given   amoxicillin-clavulanate (AUGMENTIN) 875-125 MG per tablet 1 tablet 1 tablet          Subjective: No overnight events.  Blood sugars are fairly stable.  Objective: Vitals:   01/27/20 2344 01/28/20 0257 01/28/20 0545 01/28/20 0719  BP: 131/78 126/71 (!) 121/57 (!) 133/49  Pulse: 88 87 73 66  Resp: 19 18 16 16   Temp: 98.6 F (37 C) 99.2 F (37.3 C) 99.6 F (37.6 C) 99.1 F (37.3 C)  TempSrc: Oral Oral Oral Oral  SpO2: 95% 95% 95% 95%  Weight:   95.7 kg   Height:        Intake/Output Summary (Last 24 hours) at 01/28/2020 1128 Last data filed at 01/28/2020 0847 Gross per 24 hour  Intake 720 ml  Output --  Net 720 ml   Filed Weights   01/25/20 0500 01/26/20 0417 01/28/20 0545  Weight: 92.5 kg 93.6 kg 95.7 kg    Examination:  General exam: looks very comfortable.  Not in any distress. Respiratory system: Clear to auscultation.  Some conducted airway sounds. Cardiovascular system: S1 & S2 heard, RRR. No JVD, murmurs, rubs, gallops or clicks. No pedal edema. Gastrointestinal system: Abdomen is nondistended, soft and nontender. No organomegaly or masses felt. Normal bowel sounds heard. Central nervous system: Alert and oriented. No focal neurological deficits. Extremities: Symmetric 5 x 5 power. Skin: No rashes, lesions or ulcers Psychiatry: Judgement and insight appear normal. Mood & affect normal.    Data Reviewed: I have personally reviewed following labs and imaging studies  CBC: Recent Labs  Lab 01/22/20 0415 01/23/20 0323 01/26/20 0531  WBC 5.1 4.5 5.2  HGB 9.8*  9.8* 8.9*  HCT 26.5* 27.0* 27.4*  MCV 89.5 90.0 97.5  PLT 89* 68* 326   Basic Metabolic Panel: Recent Labs  Lab 01/22/20 0002 01/22/20 0415 01/23/20 0323 01/23/20 0717 01/24/20 0704 01/24/20 1140 01/24/20 2017 01/25/20 0837 01/25/20 1241 01/26/20 0531 01/27/20 0440 01/28/20 0518  NA 137   < > 138   < > 146*   < > 144 146*  --  150* 144 148*  K 3.5   < > 4.2   < > 3.9   < > 4.4 4.0  --  3.5 3.0* 3.6  CL 100   < > 102   < > 107   < > 104 106  --  111 104 110  CO2 22   < > 26   < > 26   < > 26 30  --  32 28 29  GLUCOSE 312*   < > 330*   < > 141*   < > 378* 206*  --  161* 219* 172*  BUN 45*   < > 37*   < > 32*   < >  38* 41*  --  31* 20 11  CREATININE 3.35*   < > 2.83*   < > 2.56*   < > 2.38* 2.17*  --  1.42* 1.27* 0.95  CALCIUM 8.3*   < > 7.6*   < > 8.3*   < > 7.8* 8.1*  --  8.5* 8.6* 8.5*  MG 2.2  --  1.8   < > 1.7  --   --   --  1.5* 2.3 1.8 1.7  PHOS <1.0*  --  <1.0*  --  1.8*  --   --   --  3.1 3.2  --   --    < > = values in this interval not displayed.   GFR: Estimated Creatinine Clearance: 72.1 mL/min (by C-G formula based on SCr of 0.95 mg/dL). Liver Function Tests: Recent Labs  Lab 01/26/20 0531  ALBUMIN 2.6*   No results for input(s): LIPASE, AMYLASE in the last 168 hours. No results for input(s): AMMONIA in the last 168 hours. Coagulation Profile: No results for input(s): INR, PROTIME in the last 168 hours. Cardiac Enzymes: No results for input(s): CKTOTAL, CKMB, CKMBINDEX, TROPONINI in the last 168 hours. BNP (last 3 results) No results for input(s): PROBNP in the last 8760 hours. HbA1C: No results for input(s): HGBA1C in the last 72 hours. CBG: Recent Labs  Lab 01/27/20 0725 01/27/20 1104 01/27/20 1729 01/27/20 2017 01/28/20 0718  GLUCAP 152* 221* 243* 235* 154*   Lipid Profile: No results for input(s): CHOL, HDL, LDLCALC, TRIG, CHOLHDL, LDLDIRECT in the last 72 hours. Thyroid Function Tests: No results for input(s): TSH, T4TOTAL, FREET4,  T3FREE, THYROIDAB in the last 72 hours. Anemia Panel: No results for input(s): VITAMINB12, FOLATE, FERRITIN, TIBC, IRON, RETICCTPCT in the last 72 hours. Sepsis Labs: Recent Labs  Lab 01/23/20 1135 01/24/20 0704 01/25/20 0837  PROCALCITON 3.76 >150.00 136.13    Recent Results (from the past 240 hour(s))  SARS Coronavirus 2 by RT PCR (hospital order, performed in [facility] hospital lab) Nasopharyngeal Nasopharyngeal Swab     Status: None   Collection Time: 01/21/20 12:45 AM   Specimen: Nasopharyngeal Swab  Result Value Ref Range Status   SARS Coronavirus 2 NEGATIVE NEGATIVE Final    Comment: (NOTE) SARS-CoV-2 target nucleic acids are NOT DETECTED.  The SARS-CoV-2 RNA is generally detectable in upper and lower respiratory specimens during the acute phase of infection. The lowest concentration of SARS-CoV-2 viral copies this assay can detect is 250 copies / mL. A negative result does not preclude SARS-CoV-2 infection and should not be used as the sole basis for treatment or other patient management decisions.  A negative result may occur with improper specimen collection / handling, submission of specimen other than nasopharyngeal swab, presence of viral mutation(s) within the areas targeted by this assay, and inadequate number of viral copies (<250 copies / mL). A negative result must be combined with clinical observations, patient history, and epidemiological information.  Fact Sheet for Patients:   StrictlyIdeas.no  Fact Sheet for Healthcare Providers: BankingDealers.co.za  This test is not yet approved or  cleared by the [place] FDA and has been authorized for detection and/or diagnosis of SARS-CoV-2 by FDA under an Emergency Use Authorization (EUA).  This EUA will remain in effect (meaning this test can be used) for the duration of the COVID-19 declaration under Section 564(b)(1) of the Act, 21 U.S.C. section  360bbb-3(b)(1), unless the authorization is terminated or revoked sooner.  Performed at [facility], [address] [place], [place] [address]   CULTURE, BLOOD (ROUTINE X 2) w Reflex to ID Panel     Status: None   Collection Time: 01/21/20  3:24 AM   Specimen: BLOOD RIGHT FOREARM  Result Value Ref Range Status   Specimen Description BLOOD RIGHT FOREARM  Final   Special Requests   Final    BOTTLES DRAWN AEROBIC AND ANAEROBIC Blood Culture adequate volume   Culture   Final    NO GROWTH 5 DAYS Performed at [facility], [address] [place], [place] [address]    Report Status 01/26/2020 FINAL  Final  CULTURE, BLOOD (ROUTINE X 2) w Reflex to ID Panel     Status: None   Collection Time: 01/21/20  3:24 AM   Specimen: BLOOD  Result Value Ref Range Status   Specimen Description BLOOD LEFT ANTECUBITAL  Final   Special Requests   Final    BOTTLES DRAWN AEROBIC AND ANAEROBIC Blood Culture adequate volume   Culture   Final    NO GROWTH 5 DAYS Performed at [facility], [address], [place], [place] [address]    Report Status 01/26/2020 FINAL  Final  Urine Culture     Status: None   Collection Time: 01/21/20  5:06 AM   Specimen: Urine, Random  Result Value Ref Range Status   Specimen Description   Final    URINE, RANDOM Performed at [facility], [address], [place], [place] [address]    Special Requests   Final    NONE Performed at [facility], [address], [place], [place] [address]    Culture   Final    NO GROWTH Performed at [facility], [place] [address] [place], [place] [address]    Report Status 01/22/2020 FINAL  Final  Culture, respiratory (non-expectorated)     Status: None   Collection Time: 01/23/20  6:00 PM   Specimen: Tracheal Aspirate; Respiratory  Result Value Ref Range Status   Specimen Description   Final    TRACHEAL ASPIRATE Performed at [facility], [address]  [place], [place] [address]    Special Requests   Final    NONE Performed at [facility], [address] [place], [place] [address]    Gram Stain   Final    FEW WBC PRESENT, PREDOMINANTLY PMN MODERATE GRAM NEGATIVE RODS FEW GRAM POSITIVE COCCI RARE GRAM POSITIVE RODS    Culture   Final    RARE Normal respiratory flora-no Staph aureus or Pseudomonas seen Performed at [facility], [address] [place], [place] [address]    Report Status 01/26/2020 FINAL  Final  Culture, blood (routine x 2)     Status: None   Collection Time: 01/23/20  6:45 PM   Specimen: BLOOD  Result Value Ref Range Status   Specimen Description BLOOD BLOOD LEFT HAND  Final   Special Requests   Final    BOTTLES DRAWN AEROBIC AND ANAEROBIC Blood Culture adequate volume   Culture   Final    NO GROWTH 5 DAYS Performed at [facility], [address] [place], [place] [address]    Report Status 01/28/2020 FINAL  Final  Culture, blood (routine x 2)     Status: None   Collection Time: 01/23/20  7:56 PM   Specimen: BLOOD  Result Value Ref Range Status   Specimen Description BLOOD BLOOD RIGHT HAND  Final   Special Requests   Final  BOTTLES DRAWN AEROBIC AND ANAEROBIC Blood Culture results may not be optimal due to an excessive volume of blood received in culture bottles   Culture   Final    NO GROWTH 5 DAYS Performed at [facility], [address] [place], [place] [address]    Report Status 01/28/2020 FINAL  Final  MRSA PCR Screening     Status: None   Collection Time: 01/26/20  8:42 AM   Specimen: Nasal Mucosa; Nasopharyngeal  Result Value Ref Range Status   MRSA by PCR NEGATIVE NEGATIVE Final    Comment:        The GeneXpert MRSA Assay (FDA approved for NASAL specimens only), is one component of a comprehensive MRSA colonization surveillance program. It is not intended to diagnose MRSA infection nor to guide or monitor treatment for MRSA infections. Performed at  [facility], [address] [place], [place] [address]          Radiology Studies: No results found.      Scheduled Meds: . amoxicillin-clavulanate  1 tablet Oral Q12H  . atorvastatin  40 mg Oral QHS  . benztropine  1 mg Oral Daily  . Chlorhexidine Gluconate Cloth  6 each Topical Daily  . [START ON 01/29/2020] citalopram  20 mg Oral Daily  . docusate  100 mg Oral BID  . fluticasone furoate-vilanterol  1 puff Inhalation Daily   And  . umeclidinium bromide  1 puff Inhalation Daily  . heparin  5,000 Units Subcutaneous Q8H  . insulin aspart  0-5 Units Subcutaneous QHS  . insulin aspart  0-9 Units Subcutaneous TID WC  . insulin aspart  5 Units Subcutaneous TID WC  . insulin glargine  12 Units Subcutaneous BID  . latanoprost  1 drop Both Eyes QHS  . LORazepam  1 mg Oral BID  . magnesium oxide  400 mg Oral BID  . multivitamin with minerals  1 tablet Oral Daily  . polyethylene glycol  17 g Oral Daily  . potassium chloride  40 mEq Oral BID  . Ensure Max Protein  11 oz Oral Daily  . sodium chloride flush  10-40 mL Intracatheter Q12H  . ziprasidone  40 mg Oral QHS   Continuous Infusions: . sodium chloride Stopped (01/22/20 1720)     LOS: 8 days    Time spent: 25 minutes    Barb Merino, MD [facility] Pager [phone]

## 2020-01-28 NOTE — Progress Notes (Signed)
Occupational Therapy Treatment Patient Details Name: Natalie [name] MRN: [id number] DOB: 12-21-1960 Today's Date: 01/28/2020    History of present illness 59 year old female with history of type 2 diabetes apparently on Trulicity and Jardiance, recently not taking medicine because they were too expensive presented to the emergency room on 8/3 with unresponsiveness and hypoglycemia.  Patient recently moved from [place], no insurance here and has been out of insulin for some time.  EMS found her with saturation 70% requiring nonrebreather mask, blood glucose 570.  Patient was encephalopathic with pinpoint pupils.  In the ER, she received dose of Narcan with improvement in mentation.  Insulin GTT started and admitted to ICU. Intubated 8/5 - 8/8.   OT comments  Pt seen for OT treatment this date to follow up re: safety and endurance for ADL and functional mobility. Pt initially states she is not feeling good today, but is agreeable to OT session. Pt performed sup to sit with SUPV. LB dressing with CGA to thread socks. Pt required MIN A for EOB to stand w/ RW and for functional mobility w/in room. Pt engaged in grooming tasks standing at sink with RW & MIN A. After becoming fatigued, however, pt demonstrated two posterior LOB episodes. Continue to recommend SNF, pt expresses agreement.   Follow Up Recommendations  SNF    Equipment Recommendations  3 in 1 bedside commode;Tub/shower seat    Recommendations for Other Services      Precautions / Restrictions Precautions Precautions: Fall Restrictions Weight Bearing Restrictions: No       Mobility Bed Mobility Overal bed mobility: Needs Assistance Bed Mobility: Supine to Sit     Supine to sit: Supervision        Transfers Overall transfer level: Needs assistance Equipment used: Rolling walker (2 wheeled) Transfers: Sit to/from Stand Sit to Stand: Min assist         General transfer comment: MIN verbal/tactile cues for  safe hand placement, ed provided about rationale    Balance Overall balance assessment: Needs assistance Sitting-balance support: Single extremity supported Sitting balance-Leahy Scale: Fair     Standing balance support: Bilateral upper extremity supported Standing balance-Leahy Scale: Poor Standing balance comment: F with static stand with B UE support, P with attempts to alternate hands to participate in fxl task                           ADL either performed or assessed with clinical judgement   ADL Overall ADL's : Needs assistance/impaired     Grooming: Oral care;Min guard;Minimal assistance Grooming Details (indicate cue type and reason): CGA with static stand sink-side, but as pt faitgued, started to require MIN A to sustain stand and ultimately required verbal/tactile cue to initiate seated rest break to chair. Fatigue noted through shaking UEs supporting from counter-top. Two brief episodes of posterior LOB requiring therapist intervention to prevent fall.             Lower Body Dressing: Min guard Lower Body Dressing Details (indicate cue type and reason): threading socks in sitting. Anticiapte increased assist required for clothing mgt over hips in standing d/t balance             Functional mobility during ADLs: Rolling walker;Minimal assistance       Vision Baseline Vision/History: Wears glasses Wears Glasses: At all times Patient Visual Report: No change from baseline     Perception     Praxis      Cognition  Arousal/Alertness: Awake/alert Behavior During Therapy: WFL for tasks assessed/performed Overall Cognitive Status: Impaired/Different from baseline Area of Impairment: Orientation;Safety/judgement;Following commands;Problem solving                 Orientation Level: Person;Place;Time;Situation     Following Commands: Follows one step commands consistently;Follows one step commands with increased time Safety/Judgement:  Decreased awareness of deficits;Decreased awareness of safety   Problem Solving: Slow processing;Requires tactile cues          Exercises Other Exercises Other Exercises: OT facilitates ed re: safe hand placement with use of RW for ADL transfers, safety with eccentric control to chair. Other Exercises: OT facilitates ed re: importance of medication organization/appropriate mgt/checking feet/monitoring temp r/t DM mgt r/t outcomes with functional activity performance. Pt with moderate understanding. Would benefit from f/u.   Shoulder Instructions       General Comments      Pertinent Vitals/ Pain       Pain Assessment: No/denies pain  Home Living                                          Prior Functioning/Environment              Frequency  Min 1X/week        Progress Toward Goals  OT Goals(current goals can now be found in the care plan section)  Progress towards OT goals: Progressing toward goals  Acute Rehab OT Goals Patient Stated Goal: get strong/safe enough to go home OT Goal Formulation: With patient Time For Goal Achievement: 02/09/20 Potential to Achieve Goals: Good  Plan Discharge plan remains appropriate    Co-evaluation                 AM-PAC OT "6 Clicks" Daily Activity     Outcome Measure   Help from another person eating meals?: None Help from another person taking care of personal grooming?: A Little Help from another person toileting, which includes using toliet, bedpan, or urinal?: A Lot Help from another person bathing (including washing, rinsing, drying)?: A Lot Help from another person to put on and taking off regular upper body clothing?: A Little Help from another person to put on and taking off regular lower body clothing?: A Lot 6 Click Score: 16    End of Session Equipment Utilized During Treatment: Gait belt;Rolling walker  OT Visit Diagnosis: Unsteadiness on feet (R26.81);Muscle weakness (generalized)  (M62.81)   Activity Tolerance Patient tolerated treatment well   Patient Left in chair;with call bell/phone within reach;with chair alarm set   Nurse Communication Mobility status        Time: 2458-0998 OT Time Calculation (min): 33 min  Charges: OT General Charges $OT Visit: 1 Visit OT Treatments $Self Care/Home Management : 8-22 mins $Therapeutic Activity: 8-22 mins    Gerrianne Scale, [place], OTR/L ascom [phone] 01/28/20, 3:30 PM

## 2020-01-28 NOTE — Progress Notes (Signed)
Per Dr Sloan Leiter discontinue tele monitor

## 2020-01-28 NOTE — Progress Notes (Signed)
[place] for Electrolyte Monitoring and Replacement   Recent Labs: Potassium (mmol/L)  Date Value  01/28/2020 3.6   Magnesium (mg/dL)  Date Value  01/28/2020 1.7   Calcium (mg/dL)  Date Value  01/28/2020 8.5 (L)   Albumin (g/dL)  Date Value  01/26/2020 2.6 (L)   Phosphorus (mg/dL)  Date Value  01/26/2020 3.2   Sodium (mmol/L)  Date Value  01/28/2020 148 (H)   Corrected Ca: 9.25 mg/dL  Assessment: 59 year old female admitted to the ICU intubated and sedated with DKA on insulin drip which has since resolved. Pt transitioned to subcutaneous insulin. now off bicarbonate drip with resolution of acidosis. Pharmacy to manage electrolytes.  Goal of Therapy:  Electrolytes WNL  Plan:   Electrolytes WNL - Re-check electrolytes with morning labs  Benn Moulder, PharmD Pharmacy Resident  01/28/2020 9:54 AM

## 2020-01-29 LAB — GLUCOSE, CAPILLARY
Glucose-Capillary: 195 mg/dL — ABNORMAL HIGH (ref 70–99)
Glucose-Capillary: 240 mg/dL — ABNORMAL HIGH (ref 70–99)
Glucose-Capillary: 199 mg/dL — ABNORMAL HIGH (ref 70–99)
Glucose-Capillary: 280 mg/dL — ABNORMAL HIGH (ref 70–99)
Glucose-Capillary: 342 mg/dL — ABNORMAL HIGH (ref 70–99)

## 2020-01-29 LAB — BASIC METABOLIC PANEL
Anion gap: 8 (ref 5–15)
BUN: 7 mg/dL (ref 6–20)
CO2: 29 mmol/L (ref 22–32)
Calcium: 8.5 mg/dL — ABNORMAL LOW (ref 8.9–10.3)
Chloride: 109 mmol/L (ref 98–111)
Creatinine, Ser: 0.85 mg/dL (ref 0.44–1.00)
GFR calc Af Amer: >60 mL/min (ref >60)
GFR calc non Af Amer: >60 mL/min (ref >60)
Glucose, Bld: 205 mg/dL — ABNORMAL HIGH (ref 70–99)
Potassium: 3.6 mmol/L (ref 3.5–5.1)
Sodium: 146 mmol/L — ABNORMAL HIGH (ref 135–145)

## 2020-01-29 MED ORDER — FUROSEMIDE 40 MG PO TABS
40.0000 mg | ORAL_TABLET | Freq: Once | ORAL | Status: AC
  Administered 2020-01-29: 16:00:00 40 mg via ORAL
  Filled 2020-01-29: qty 1

## 2020-01-29 MED ORDER — INSULIN ASPART 100 UNIT/ML ~~LOC~~ SOLN
7.0000 [IU] | Freq: Three times a day (TID) | SUBCUTANEOUS | Status: DC
  Administered 2020-01-29 – 2020-01-30 (×3): 7 [IU] via SUBCUTANEOUS
  Filled 2020-01-29 (×3): qty 1

## 2020-01-29 MED ORDER — INSULIN GLARGINE 100 UNIT/ML ~~LOC~~ SOLN
14.0000 [IU] | Freq: Two times a day (BID) | SUBCUTANEOUS | Status: DC
  Administered 2020-01-29 – 2020-01-30 (×2): 14 [IU] via SUBCUTANEOUS
  Filled 2020-01-29 (×4): qty 0.14

## 2020-01-29 NOTE — Progress Notes (Signed)
PROGRESS NOTE    Natalie [name]  SWF:[id number] DOB: [birth year] DOA: 01/20/2020 PCP: [facility]    Brief Narrative:  59 year old female with history of type 2 diabetes, bipolar disorder, apparently on Trulicity and Jardiance, recently not taking medicine because they were too expensive presented to the emergency room on 8/3 with unresponsiveness and hypoglycemia.  Patient recently moved from [place], no insurance here and has been out of insulin for some time.  EMS found her with saturation 70% requiring nonrebreather mask, blood glucose 570.  Patient was encephalopathic with pinpoint pupils.  In the ER, she received dose of Narcan with improvement in mentation.  Insulin GTT started and admitted to ICU.  08/4: Pt admitted to ICU with DKA 8/5 severe DKA and unresponsive state.  Intubated. 8/6- DKA regimen reviewed with Diabetic coordinator and adjusted, patient remains unresponsive despite no sedation. 01/24/20 - Patient medically optimized with resolution of DKA , s/p liberation from Mechanical intubation. Care plan reviewed with son at bedside.  01/25/20-extubated.  Transfer to triad care.  Assessment & Plan:   Active Problems:   DKA (diabetic ketoacidoses) ([place])  Diabetic ketoacidosis: With history of insulin-dependent diabetes. Not taken any treatment since 3 months.  Prescribed Trulicity and Jardiance that she is not able to afford.  hemoglobin A1c 11.7. Treated with insulin drip with improvement.  Converted to subcu insulin. Patient can be treated with conventional insulin that is covered by her Medicaid.   Patient was prescribed Trulicity and Jardiance which are very expensive medications, will prescribe generic insulin on discharge. Blood sugar fairly controlled.  Increase dose of Levemir to 14 units twice a day and NovoLog 7 units 3 times a day.  Will add Metformin on discharge.  Acute hypoxemic respiratory failure secondary to DKA, metabolic  encephalopathy: Improved.  On room air now.  Continue to monitor.  Mobilize.  Continue incentive spirometry. Patient treated for aspiration pneumonia/community-acquired pneumonia.  Treated with vancomycin and Zosyn.  Discontinue vancomycin.  Change Zosyn to Augmentin for total 3/7 days.   Altered mental status with encephalopathy: Resolved.  Due to DKA.  CT head was negative.  Acute renal failure: Improved.  Normalized.  Foley removed.  Hypernatremia: Mildly elevated.  Encourage oral liquid intake.   Bipolar disorder: On multiple medications including Celexa, Geodon, Cogentin, Ativan at home.  Verified with family.  Restarted.  Continue to work with PT OT.  Profound physical deconditioning and she will benefit with short-term rehab at a skilled nursing facility before going home to live with her son.     DVT prophylaxis: enoxaparin (LOVENOX) injection 40 mg Start: 01/28/20 2200 SCDs Start: 01/22/20 1404   Code Status: Full code. Family Communication: Son on the phone 8/10. Disposition Plan: Status is: Inpatient  Remains inpatient appropriate because: Unsafe discharge disposition.   Dispo: The patient is from: Home              Anticipated d/c is to: Skilled nursing facility.              Anticipated d/c date is: When bed available.              Patient currently is not medically stable to d/c.         Consultants:   PCCM  Procedures:   Mechanical ventilation  Antimicrobials:  Antibiotics Given (last 72 hours)    Date/Time Action Medication Dose Rate   01/26/20 2319 New Bag/Given   piperacillin-tazobactam (ZOSYN) IVPB 3.375 g 3.375 g 12.5 mL/hr   01/27/20  0542 New Bag/Given   piperacillin-tazobactam (ZOSYN) IVPB 3.375 g 3.375 g 12.5 mL/hr   01/27/20 1024 Given   amoxicillin-clavulanate (AUGMENTIN) 875-125 MG per tablet 1 tablet 1 tablet    01/27/20 2251 Given   amoxicillin-clavulanate (AUGMENTIN) 875-125 MG per tablet 1 tablet 1 tablet    01/28/20 0951 Given    amoxicillin-clavulanate (AUGMENTIN) 875-125 MG per tablet 1 tablet 1 tablet    01/28/20 2057 Given   amoxicillin-clavulanate (AUGMENTIN) 875-125 MG per tablet 1 tablet 1 tablet    01/29/20 0945 Given   amoxicillin-clavulanate (AUGMENTIN) 875-125 MG per tablet 1 tablet 1 tablet          Subjective: No overnight events.  Blood sugar slightly elevated.  She has swelling of both feet.  Will use 1 dose of Lasix, elevate leg.  Objective: Vitals:   01/29/20 0030 01/29/20 0437 01/29/20 0826 01/29/20 1216  BP: 136/68 (!) 141/72 (!) 141/81 (!) 144/68  Pulse: 82 89 90 74  Resp: 19 18 16 18   Temp: 98.9 F (37.2 C) 98.8 F (37.1 C) 99.4 F (37.4 C) 98.6 F (37 C)  TempSrc: Oral  Oral Oral  SpO2: 97% 93% 96% 96%  Weight:      Height:        Intake/Output Summary (Last 24 hours) at 01/29/2020 1327 Last data filed at 01/29/2020 1003 Gross per 24 hour  Intake 360 ml  Output --  Net 360 ml   Filed Weights   01/25/20 0500 01/26/20 0417 01/28/20 0545  Weight: 92.5 kg 93.6 kg 95.7 kg    Examination:  General exam: looks very comfortable.  Not in any distress. Respiratory system: Clear to auscultation.  Some conducted airway sounds. Cardiovascular system: S1 & S2 heard, RRR. No JVD, murmurs, rubs, gallops or clicks.  1+ pedal edema. Gastrointestinal system: Abdomen is nondistended, soft and nontender. No organomegaly or masses felt. Normal bowel sounds heard. Central nervous system: Alert and oriented. No focal neurological deficits. Extremities: Symmetric 5 x 5 power. Skin: No rashes, lesions or ulcers Psychiatry: Judgement and insight appear normal. Mood & affect normal.    Data Reviewed: I have personally reviewed following labs and imaging studies  CBC: Recent Labs  Lab 01/23/20 0323 01/26/20 0531  WBC 4.5 5.2  HGB 9.8* 8.9*  HCT 27.0* 27.4*  MCV 90.0 97.5  PLT 68* 098   Basic Metabolic Panel: Recent Labs  Lab 01/23/20 0323 01/23/20 0717 01/24/20 0704  01/24/20 1140 01/25/20 0837 01/25/20 1241 01/26/20 0531 01/27/20 0440 01/28/20 0518 01/29/20 0427  NA 138   < > 146*   < > 146*  --  150* 144 148* 146*  K 4.2   < > 3.9   < > 4.0  --  3.5 3.0* 3.6 3.6  CL 102   < > 107   < > 106  --  111 104 110 109  CO2 26   < > 26   < > 30  --  32 28 29 29   GLUCOSE 330*   < > 141*   < > 206*  --  161* 219* 172* 205*  BUN 37*   < > 32*   < > 41*  --  31* 20 11 7   CREATININE 2.83*   < > 2.56*   < > 2.17*  --  1.42* 1.27* 0.95 0.85  CALCIUM 7.6*   < > 8.3*   < > 8.1*  --  8.5* 8.6* 8.5* 8.5*  MG 1.8   < > 1.7  --   --  1.5* 2.3 1.8 1.7  --   PHOS <1.0*  --  1.8*  --   --  3.1 3.2  --   --   --    < > = values in this interval not displayed.   GFR: Estimated Creatinine Clearance: 80.6 mL/min (by C-G formula based on SCr of 0.85 mg/dL). Liver Function Tests: Recent Labs  Lab 01/26/20 0531  ALBUMIN 2.6*   No results for input(s): LIPASE, AMYLASE in the last 168 hours. No results for input(s): AMMONIA in the last 168 hours. Coagulation Profile: No results for input(s): INR, PROTIME in the last 168 hours. Cardiac Enzymes: No results for input(s): CKTOTAL, CKMB, CKMBINDEX, TROPONINI in the last 168 hours. BNP (last 3 results) No results for input(s): PROBNP in the last 8760 hours. HbA1C: No results for input(s): HGBA1C in the last 72 hours. CBG: Recent Labs  Lab 01/28/20 1558 01/28/20 2208 01/29/20 0557 01/29/20 0822 01/29/20 1240  GLUCAP 193* 271* 195* 240* 199*   Lipid Profile: No results for input(s): CHOL, HDL, LDLCALC, TRIG, CHOLHDL, LDLDIRECT in the last 72 hours. Thyroid Function Tests: No results for input(s): TSH, T4TOTAL, FREET4, T3FREE, THYROIDAB in the last 72 hours. Anemia Panel: No results for input(s): VITAMINB12, FOLATE, FERRITIN, TIBC, IRON, RETICCTPCT in the last 72 hours. Sepsis Labs: Recent Labs  Lab 01/23/20 1135 01/24/20 0704 01/25/20 0837  PROCALCITON 3.76 >150.00 136.13    Recent Results (from the past  240 hour(s))  SARS Coronavirus 2 by RT PCR (hospital order, performed in [facility] hospital lab) Nasopharyngeal Nasopharyngeal Swab     Status: None   Collection Time: 01/21/20 12:45 AM   Specimen: Nasopharyngeal Swab  Result Value Ref Range Status   SARS Coronavirus 2 NEGATIVE NEGATIVE Final    Comment: (NOTE) SARS-CoV-2 target nucleic acids are NOT DETECTED.  The SARS-CoV-2 RNA is generally detectable in upper and lower respiratory specimens during the acute phase of infection. The lowest concentration of SARS-CoV-2 viral copies this assay can detect is 250 copies / mL. A negative result does not preclude SARS-CoV-2 infection and should not be used as the sole basis for treatment or other patient management decisions.  A negative result may occur with improper specimen collection / handling, submission of specimen other than nasopharyngeal swab, presence of viral mutation(s) within the areas targeted by this assay, and inadequate number of viral copies (<250 copies / mL). A negative result must be combined with clinical observations, patient history, and epidemiological information.  Fact Sheet for Patients:   StrictlyIdeas.no  Fact Sheet for Healthcare Providers: BankingDealers.co.za  This test is not yet approved or  cleared by the [place] FDA and has been authorized for detection and/or diagnosis of SARS-CoV-2 by FDA under an Emergency Use Authorization (EUA).  This EUA will remain in effect (meaning this test can be used) for the duration of the COVID-19 declaration under Section 564(b)(1) of the Act, 21 U.S.C. section 360bbb-3(b)(1), unless the authorization is terminated or revoked sooner.  Performed at [facility], [address] [place], [place] [address]   CULTURE, BLOOD (ROUTINE X 2) w Reflex to ID Panel     Status: None   Collection Time: 01/21/20  3:24 AM   Specimen: BLOOD RIGHT FOREARM  Result  Value Ref Range Status   Specimen Description BLOOD RIGHT FOREARM  Final   Special Requests   Final    BOTTLES DRAWN AEROBIC AND ANAEROBIC Blood Culture adequate volume   Culture   Final  NO GROWTH 5 DAYS Performed at [facility], [address] [place], [place] [address]    Report Status 01/26/2020 FINAL  Final  CULTURE, BLOOD (ROUTINE X 2) w Reflex to ID Panel     Status: None   Collection Time: 01/21/20  3:24 AM   Specimen: BLOOD  Result Value Ref Range Status   Specimen Description BLOOD LEFT ANTECUBITAL  Final   Special Requests   Final    BOTTLES DRAWN AEROBIC AND ANAEROBIC Blood Culture adequate volume   Culture   Final    NO GROWTH 5 DAYS Performed at [facility], [address], [place], [place] [address]    Report Status 01/26/2020 FINAL  Final  Urine Culture     Status: None   Collection Time: 01/21/20  5:06 AM   Specimen: Urine, Random  Result Value Ref Range Status   Specimen Description   Final    URINE, RANDOM Performed at [facility], [address], [place], [place] [address]    Special Requests   Final    NONE Performed at [facility], [address] [place], [place] [address]    Culture   Final    NO GROWTH Performed at [facility], [place] [address] [place], [place] [address]    Report Status 01/22/2020 FINAL  Final  Culture, respiratory (non-expectorated)     Status: None   Collection Time: 01/23/20  6:00 PM   Specimen: Tracheal Aspirate; Respiratory  Result Value Ref Range Status   Specimen Description   Final    TRACHEAL ASPIRATE Performed at [facility], [address] [place], [place] [address]    Special Requests   Final    NONE Performed at [facility], [address] [place], [place] [address]    Gram Stain   Final    FEW WBC PRESENT, PREDOMINANTLY PMN MODERATE GRAM NEGATIVE RODS FEW GRAM POSITIVE COCCI RARE GRAM POSITIVE RODS    Culture   Final    RARE  Normal respiratory flora-no Staph aureus or Pseudomonas seen Performed at [facility], [address] [place], [place] [address]    Report Status 01/26/2020 FINAL  Final  Culture, blood (routine x 2)     Status: None   Collection Time: 01/23/20  6:45 PM   Specimen: BLOOD  Result Value Ref Range Status   Specimen Description BLOOD BLOOD LEFT HAND  Final   Special Requests   Final    BOTTLES DRAWN AEROBIC AND ANAEROBIC Blood Culture adequate volume   Culture   Final    NO GROWTH 5 DAYS Performed at [facility], [address] [place], [place] [address]    Report Status 01/28/2020 FINAL  Final  Culture, blood (routine x 2)     Status: None   Collection Time: 01/23/20  7:56 PM   Specimen: BLOOD  Result Value Ref Range Status   Specimen Description BLOOD BLOOD RIGHT HAND  Final   Special Requests   Final    BOTTLES DRAWN AEROBIC AND ANAEROBIC Blood Culture results may not be optimal due to an excessive volume of blood received in culture bottles   Culture   Final    NO GROWTH 5 DAYS Performed at [facility], [address] [place],  [address]    Report Status 01/28/2020 FINAL  Final  MRSA PCR Screening     Status: None   Collection Time: 01/26/20  8:42 AM  Specimen: Nasal Mucosa; Nasopharyngeal  Result Value Ref Range Status   MRSA by PCR NEGATIVE NEGATIVE Final    Comment:        The GeneXpert MRSA Assay (FDA approved for NASAL specimens only), is one component of a comprehensive MRSA colonization surveillance program. It is not intended to diagnose MRSA infection nor to guide or monitor treatment for MRSA infections. Performed at [facility], [address], [place], [place] [address]          Radiology Studies: No results found.      Scheduled Meds: . amoxicillin-clavulanate  1 tablet Oral Q12H  . atorvastatin  40 mg Oral QHS  . benztropine  1 mg Oral Daily  . Chlorhexidine Gluconate Cloth  6 each Topical  Daily  . citalopram  20 mg Oral Daily  . docusate  100 mg Oral BID  . enoxaparin (LOVENOX) injection  40 mg Subcutaneous Q24H  . fluticasone furoate-vilanterol  1 puff Inhalation Daily   And  . umeclidinium bromide  1 puff Inhalation Daily  . furosemide  40 mg Oral Once  . insulin aspart  0-5 Units Subcutaneous QHS  . insulin aspart  0-9 Units Subcutaneous TID WC  . insulin aspart  7 Units Subcutaneous TID WC  . insulin glargine  14 Units Subcutaneous BID  . latanoprost  1 drop Both Eyes QHS  . LORazepam  1 mg Oral BID  . magnesium oxide  400 mg Oral BID  . multivitamin with minerals  1 tablet Oral Daily  . polyethylene glycol  17 g Oral Daily  . Ensure Max Protein  11 oz Oral Daily  . sodium chloride flush  10-40 mL Intracatheter Q12H  . ziprasidone  40 mg Oral QHS   Continuous Infusions: . sodium chloride Stopped (01/22/20 1720)     LOS: 9 days    Time spent: 25 minutes    Barb Merino, MD [facility] Pager [phone]

## 2020-01-29 NOTE — Progress Notes (Signed)
[place] for Electrolyte Monitoring and Replacement   Recent Labs: Potassium (mmol/L)  Date Value  01/29/2020 3.6   Magnesium (mg/dL)  Date Value  01/28/2020 1.7   Calcium (mg/dL)  Date Value  01/29/2020 8.5 (L)   Albumin (g/dL)  Date Value  01/26/2020 2.6 (L)   Phosphorus (mg/dL)  Date Value  01/26/2020 3.2   Sodium (mmol/L)  Date Value  01/29/2020 146 (H)   Corrected Ca: 9.25 mg/dL  Assessment: 59 year old female admitted to the ICU intubated and sedated with DKA on insulin drip which has since resolved. Pt transitioned to subcutaneous insulin. now off bicarbonate drip with resolution of acidosis. Pharmacy to manage electrolytes.  Goal of Therapy:  Electrolytes WNL  Plan:   Electrolytes WNL - Re-check electrolytes with morning labs  Benn Moulder, PharmD Pharmacy Resident  01/29/2020 1:02 PM

## 2020-01-29 NOTE — Progress Notes (Signed)
Inpatient Diabetes Program Recommendations  AACE/ADA: New Consensus Statement on Inpatient Glycemic Control  Target Ranges:  Prepandial:   less than 140 mg/dL      Peak postprandial:   less than 180 mg/dL (1-2 hours)      Critically ill patients:  140 - 180 mg/dL   Results for YSIDRA, SOPHER (MRN [id number]) as of 01/29/2020 08:13  Ref. Range 01/28/2020 07:18 01/28/2020 12:00 01/28/2020 15:58 01/28/2020 22:08 01/29/2020 05:57  Glucose-Capillary Latest Ref Range: 70 - 99 mg/dL 154 (H) 234 (H) 193 (H) 271 (H) 195 (H)   Review of Glycemic Control  Diabetes history:DM2 Outpatient Diabetes medications:Trulicity 1.5 mg Qweek, Jardiance 25 mg daily, Metformin 500 mg BID(out of DM medicationssince May) Current orders for Inpatient glycemic control:Lantus 12 units BID, Novolog 0-9 units TID with meals, Novolog 0-5 units QHS, Novolog 5 units TID with meals for meal coverage  Inpatient Diabetes Program Recommendations:  Insulin-Please consider increasing Lantus to 14 units BID and meal coverage to Novolog 7 units TID with meals.  Thanks, Barnie Alderman, RN, MSN, CDE Diabetes Coordinator Inpatient Diabetes Program [phone] (Team Pager from 8am to 5pm)

## 2020-01-30 DIAGNOSIS — E1111 Type 2 diabetes mellitus with ketoacidosis with coma: Principal | ICD-10-CM

## 2020-01-30 LAB — BASIC METABOLIC PANEL
Anion gap: 13 (ref 5–15)
BUN: 8 mg/dL (ref 6–20)
CO2: 28 mmol/L (ref 22–32)
Calcium: 8.9 mg/dL (ref 8.9–10.3)
Chloride: 102 mmol/L (ref 98–111)
Creatinine, Ser: 0.89 mg/dL (ref 0.44–1.00)
GFR calc Af Amer: >60 mL/min (ref >60)
GFR calc non Af Amer: >60 mL/min (ref >60)
Glucose, Bld: 255 mg/dL — ABNORMAL HIGH (ref 70–99)
Potassium: 2.9 mmol/L — ABNORMAL LOW (ref 3.5–5.1)
Sodium: 143 mmol/L (ref 135–145)

## 2020-01-30 LAB — GLUCOSE, CAPILLARY
Glucose-Capillary: 230 mg/dL — ABNORMAL HIGH (ref 70–99)
Glucose-Capillary: 237 mg/dL — ABNORMAL HIGH (ref 70–99)
Glucose-Capillary: 239 mg/dL — ABNORMAL HIGH (ref 70–99)
Glucose-Capillary: 243 mg/dL — ABNORMAL HIGH (ref 70–99)

## 2020-01-30 LAB — PHOSPHORUS: Phosphorus: 4.4 mg/dL (ref 2.5–4.6)

## 2020-01-30 LAB — MAGNESIUM: Magnesium: 1.4 mg/dL — ABNORMAL LOW (ref 1.7–2.4)

## 2020-01-30 MED ORDER — POTASSIUM CHLORIDE 20 MEQ PO PACK
40.0000 meq | PACK | ORAL | Status: AC
  Administered 2020-01-30 (×2): 40 meq via ORAL
  Filled 2020-01-30 (×2): qty 2

## 2020-01-30 MED ORDER — MAGNESIUM OXIDE 400 (241.3 MG) MG PO TABS: 400.0000 mg | ORAL_TABLET | Freq: Two times a day (BID) | ORAL | 0 refills | Status: AC

## 2020-01-30 MED ORDER — POTASSIUM CHLORIDE 20 MEQ PO PACK: 20.0000 meq | PACK | Freq: Every day | ORAL | 0 refills | Status: DC

## 2020-01-30 MED ORDER — INSULIN PEN NEEDLE 32G X 4 MM MISC: 1.0000 | Freq: Every day | 3 refills | Status: AC

## 2020-01-30 MED ORDER — INSULIN GLARGINE 100 UNITS/ML SOLOSTAR PEN: 15.0000 [IU] | PEN_INJECTOR | Freq: Two times a day (BID) | SUBCUTANEOUS | 11 refills | Status: DC

## 2020-01-30 MED ORDER — INSULIN ASPART 100 UNIT/ML FLEXPEN
9.0000 [IU] | PEN_INJECTOR | Freq: Three times a day (TID) | SUBCUTANEOUS | 11 refills | Status: DC
Start: 2020-01-30 — End: 2020-10-07

## 2020-01-30 MED ORDER — MAGNESIUM SULFATE 2 GM/50ML IV SOLN
2.0000 g | Freq: Once | INTRAVENOUS | Status: AC
  Administered 2020-01-30: 2 g via INTRAVENOUS
  Filled 2020-01-30: qty 50

## 2020-01-30 NOTE — Plan of Care (Signed)
Patient discharged home per MD orders. All discharge instructions given and all questions answered. 

## 2020-01-30 NOTE — Progress Notes (Signed)
Physical Therapy Treatment Patient Details Name: Natalie [name] MRN: [id number] DOB: 11-17-1960 Today's Date: 01/30/2020    History of Present Illness 59 year old female with history of type 2 diabetes apparently on Trulicity and Jardiance, recently not taking medicine because they were too expensive presented to the emergency room on 8/3 with unresponsiveness and hypoglycemia.  Patient recently moved from [place], no insurance here and has been out of insulin for some time.  EMS found her with saturation 70% requiring nonrebreather mask, blood glucose 570.  Patient was encephalopathic with pinpoint pupils.  In the ER, she received dose of Narcan with improvement in mentation.  Insulin GTT started and admitted to ICU. Intubated 8/5 - 8/8.    PT Comments    Pt in bed upon entry agreeable to participate. Pt given pant as requested, able to don pants without assistance, then STS with supervision whist pants are pulled up independently. Pt able to progress AMB tolerance this date, two bouts of 184ft with RW, no LOB, pt exhibits good steadiness with RW. Pt has good safety awareness, asks about updating DC to home, which is favorable from PT standpoint. CSW updated regarding HHPT needs.   Follow Up Recommendations  Home health PT;Supervision - Intermittent     Equipment Recommendations  Rolling walker with 5" wheels    Recommendations for Other Services       Precautions / Restrictions Precautions Precautions: Fall Restrictions Weight Bearing Restrictions: No    Mobility  Bed Mobility               General bed mobility comments: seated EOB upon entry  Transfers Overall transfer level: Modified independent Equipment used: Rolling walker (2 wheeled) Transfers: Sit to/from Stand Sit to Stand: Supervision            Ambulation/Gait Ambulation/Gait assistance: Min guard Gait Distance (Feet): 190 Feet (2x157ft, with 4 minute seated recovery interval  between.) Assistive device: Rolling walker (2 wheeled) Gait Pattern/deviations: Step-through pattern Gait velocity: 0.42m/s   General Gait Details: slow, labored effort, but appears steady with RW   Stairs             Wheelchair Mobility    Modified Rankin (Stroke Patients Only)       Balance                                            Cognition Arousal/Alertness: Awake/alert                                            Exercises      General Comments        Pertinent Vitals/Pain Pain Assessment: No/denies pain    Home Living                      Prior Function            PT Goals (current goals can now be found in the care plan section) Acute Rehab PT Goals Patient Stated Goal: get strong/safe enough to go home PT Goal Formulation: With patient Time For Goal Achievement: 02/10/20 Potential to Achieve Goals: Good Progress towards PT goals: Progressing toward goals    Frequency    Min 2X/week      PT Plan Discharge plan needs  to be updated;Equipment recommendations need to be updated    Co-evaluation              AM-PAC PT "6 Clicks" Mobility   Outcome Measure  Help needed turning from your back to your side while in a flat bed without using bedrails?: A Little Help needed moving from lying on your back to sitting on the side of a flat bed without using bedrails?: A Little Help needed moving to and from a bed to a chair (including a wheelchair)?: A Little Help needed standing up from a chair using your arms (e.g., wheelchair or bedside chair)?: A Little Help needed to walk in hospital room?: A Little Help needed climbing 3-5 steps with a railing? : A Lot 6 Click Score: 17    End of Session Equipment Utilized During Treatment: Gait belt Activity Tolerance: Patient limited by fatigue Patient left: with chair alarm set;with call bell/phone within reach Nurse Communication: Mobility  status PT Visit Diagnosis: Muscle weakness (generalized) (M62.81);Difficulty in walking, not elsewhere classified (R26.2);Unsteadiness on feet (R26.81)     Time: 5093-2671 PT Time Calculation (min) (ACUTE ONLY): 16 min  Charges:  $Therapeutic Exercise: 8-22 mins                     12:56 PM, 01/30/20 Natalie [name], PT, DPT Physical Therapist - [facility]  [phone] ([place])     [place] C 01/30/2020, 12:52 PM

## 2020-01-30 NOTE — Discharge Summary (Signed)
Physician Discharge Summary  Natalie [name] XNT:[id number] DOB: 1961/04/24 DOA: 01/20/2020  PCP: [facility] [place] date: 01/20/2020 Discharge date: 01/30/2020  Admitted From: Home Disposition: Home with home health PT OT and RN  Recommendations for Outpatient Follow-up:  1. Follow up with PCP in 1-2 weeks 2. Please obtain BMP/CBC/magnesium in one week  Home Health: PT/OT/RN/home health aide Equipment/Devices: 3 and 1  Discharge Condition: Stable CODE STATUS: Full code Diet recommendation: Low-carb diet  Discharge summary: 59 year old female with history of type 2 diabetes, bipolar disorder, apparently on Trulicity and Jardiance, recently not taking medicine because they were too expensive presented to the emergency room on 8/3 with unresponsiveness and hyperglycemia.  Patient recently moved from [place], no insurance here and has been out of insulin for some time.  EMS found her with saturation 70% requiring nonrebreather mask, blood glucose 570.  Patient was encephalopathic with pinpoint pupils.  In the ER, she received dose of Narcan with improvement in mentation.  Insulin GTT started and admitted to ICU. 08/4: Pt admitted to ICU with DKA 8/5 severe DKA and unresponsive state.  Intubated. 01/25/20-extubated.  Transferred to floor.   This patient was treated for multiple medical issues and her medical problems and further plan of care is explained as bullet points below.  #1 . diabetic ketoacidosis: With history of insulin-dependent diabetes. Not taken any treatment since 3 months.  She was prescribed Trulicity and Jardiance that she is not able to afford.  hemoglobin A1c 11.7. Treated with insulin drip with improvement.  Converted to subcu insulin. Patient was started on conventional regimen with Levemir and NovoLog with excellent blood sugar control. She is going home on Levemir 15 units twice a day, NovoLog 9 units with meals and Metformin. Patient  and son educated about blood sugar monitoring at home, insulin regimen and administration and comfortable to take at home. Also verified that her conventional insulin regimen is covered by her Medicaid.  #2. Acute hypoxemic respiratory failure secondary to DKA, metabolic encephalopathy: She had encephalopathy due to DKA and was on ventilator for 5 days.  Subsequently extubated and currently on room air with no remaining issues.  Treated with antibiotics and finished therapy.  #3. Acute renal failure: Improved.  Normalized.  Foley removed.  #4 .hypernatremia: Mildly elevated.  Encourage oral liquid intake.   #5 .bipolar disorder: On multiple medications including Celexa, Geodon, Cogentin, Ativan at home.  Verified with family.  Restarted.  Patient is medically stable.  Electrolytes were replaced before discharge.  She will also have oral replacement at home.  She was profoundly deconditioned while in ICU and intubated.  She was initially waiting to go to skilled nursing facility to continue inpatient therapies, however she continued to work with therapies in the hospital waiting for bed availability and did very well recovery.  With good recovery, she is able to go home, she has family support at home.  She will greatly benefit with ongoing therapy evaluation at home along with skilled nursing to evaluate her complex medical need and home health aide.   Discharge Diagnoses:  Active Problems:   DKA (diabetic ketoacidoses) [facility])    Discharge Instructions  Discharge Instructions    Diet - low sodium heart healthy   Complete by: As directed    Diet Carb Modified   Complete by: As directed    Discharge instructions   Complete by: As directed    Check your blood sugars and write it down, bring records to your doctors visit  Increase activity slowly   Complete by: As directed    No wound care   Complete by: As directed      Allergies as of 01/30/2020   No Known Allergies      Medication List    STOP taking these medications   Jardiance 25 MG Tabs tablet Generic drug: empagliflozin   Trulicity 1.5 WP/8.0DX Sopn Generic drug: Dulaglutide     TAKE these medications   atorvastatin 40 MG tablet Commonly known as: LIPITOR Take 40 mg by mouth at bedtime.   benztropine 1 MG tablet Commonly known as: COGENTIN Take 1 tablet by mouth daily.   citalopram 20 MG tablet Commonly known as: CELEXA Take 20 mg by mouth every morning.   Daily-Vite Multivitamin Tabs Take 1 tablet by mouth daily.   insulin aspart 100 UNIT/ML FlexPen Commonly known as: NOVOLOG Inject 9 Units into the skin 3 (three) times daily with meals.   insulin glargine 100 unit/mL Sopn Commonly known as: LANTUS Inject 0.15 mLs (15 Units total) into the skin 2 (two) times daily.   Insulin Pen Needle 32G X 4 MM Misc 1 each by Does not apply route 5 (five) times daily.   latanoprost 0.005 % ophthalmic solution Commonly known as: XALATAN Place 1 drop into both eyes at bedtime.   LORazepam 1 MG tablet Commonly known as: ATIVAN Take 1 mg by mouth 2 (two) times daily.   magnesium oxide 400 (241.3 Mg) MG tablet Commonly known as: MAG-OX Take 1 tablet (400 mg total) by mouth 2 (two) times daily for 14 days.   potassium chloride 20 MEQ packet Commonly known as: KLOR-CON Take 20 mEq by mouth daily for 14 days.   Trelegy Ellipta 100-62.5-25 MCG/INH Aepb Generic drug: Fluticasone-Umeclidin-Vilant Inhale 1 puff into the lungs daily.   ziprasidone 40 MG capsule Commonly known as: GEODON Take 1 capsule by mouth at bedtime.            Durable Medical Equipment  (From admission, onward)         Start     Ordered   01/30/20 1248  For home use only DME 3 n 1  Once        01/30/20 1247          [employer], [employer]. Go on 02/11/2020.   Why: at 2:20pm Contact information: [place] [address] [phone]               No Known Allergies  Consultations:  PCCM  Diabetic educator   Procedures/Studies: DG Abd 1 View  Result Date: 01/23/2020 CLINICAL DATA:  Abdominal distension. EXAM: ABDOMEN - 1 VIEW COMPARISON:  None. FINDINGS: A nasogastric tube is seen with its distal tip overlying the body of the stomach. The bowel gas pattern is normal. Radiopaque surgical clips are seen overlying the right upper quadrant. No radio-opaque calculi or other significant radiographic abnormality are seen. IMPRESSION: No evidence of bowel obstruction or ileus. Electronically Signed   By: Virgina Norfolk M.D.   On: 01/23/2020 21:38   CT HEAD WO CONTRAST  Result Date: 01/21/2020 CLINICAL DATA:  Severe agitation.  Delirium. EXAM: CT HEAD WITHOUT CONTRAST TECHNIQUE: Contiguous axial images were obtained from the base of the skull through the vertex without intravenous contrast. COMPARISON:  None. FINDINGS: Brain: No evidence of acute infarction, hemorrhage, hydrocephalus, extra-axial collection or mass lesion/mass effect. Vascular: No hyperdense vessel or unexpected calcification. Skull: Normal. Negative for fracture or focal lesion.  Sinuses/Orbits: No acute finding. IMPRESSION: Negative head CT. Electronically Signed   By: Monte Fantasia M.D.   On: 01/21/2020 06:27   DG Chest Port 1 View  Result Date: 01/26/2020 CLINICAL DATA:  Acute respiratory failure. EXAM: PORTABLE CHEST 1 VIEW COMPARISON:  01/21/2020. FINDINGS: Mild cardiac enlargement. No pleural effusion or edema. Diffuse, chronic interstitial coarsening noted bilaterally. Left midlung nodular density is persistent when compared with previous exam. IMPRESSION: 1. Persistent left midlung nodular density. Further investigation with contrast enhanced CT of the chest is advised to assess for underlying malignancy. 2. No new findings. Electronically Signed   By: Kerby Moors M.D.   On: 01/26/2020 07:50   DG Chest Port 1 View  Result Date: 01/24/2020 CLINICAL DATA:  Acute  respiratory failure. EXAM: PORTABLE CHEST 1 VIEW COMPARISON:  01/23/2020 and 01/20/2020 FINDINGS: Endotracheal tube is 5.1 cm above the carina. Nasogastric tube extends into the abdomen but the tip is beyond the image. Increased densities at the right lung base. Focal rounded or nodular density in the mid left lung is more conspicuous. Heart size is stable. Negative for a pneumothorax. IMPRESSION: 1. Increasing densities at the right lung base. Findings are suggestive for right pleural fluid and likely airspace disease/consolidation. 2. Focal density in the mid left lung has become more conspicuous over the recent chest radiographs. Suspect this is a small focus of airspace disease. Recommend attention on follow-up imaging. Electronically Signed   By: Markus Daft M.D.   On: 01/24/2020 08:43   DG Chest Port 1 View  Result Date: 01/23/2020 CLINICAL DATA:  Fever and chills EXAM: PORTABLE CHEST 1 VIEW COMPARISON:  01/21/2020 FINDINGS: Endotracheal tube tip is about 3.4 cm superior to the carina. Esophageal tube tip is below the diaphragm but incompletely visualized. Small bilateral effusions. Mild cardiomegaly with central vascular congestion. Hazy atelectasis at the lung bases. IMPRESSION: Small bilateral pleural effusions with hazy atelectasis at the lung bases. Mild cardiomegaly with central vascular congestion. Electronically Signed   By: Donavan Foil M.D.   On: 01/23/2020 19:41   DG Chest Port 1 View  Result Date: 01/21/2020 CLINICAL DATA:  Endotracheal and NG tube placements EXAM: PORTABLE CHEST 1 VIEW COMPARISON:  01/20/2020 FINDINGS: Endotracheal tube placed with tip measuring 4.2 cm above the carina. Enteric tube tip is projected over the upper mid abdomen consistent location in the upper stomach. Shallow inspiration. Heart size and pulmonary vascularity are normal. Nodular infiltration in the left mid and lower lung zone. IMPRESSION: Appliances appear in satisfactory location. Nodular infiltration in the  left mid and lower lung zone. Electronically Signed   By: Lucienne Capers M.D.   On: 01/21/2020 04:00   DG Chest Portable 1 View  Result Date: 01/21/2020 CLINICAL DATA:  Altered mental status EXAM: PORTABLE CHEST 1 VIEW COMPARISON:  None. FINDINGS: Possible patchy airspace opacity in the left lower lung. Normal heart size. No pneumothorax. IMPRESSION: Possible patchy airspace opacity/mild pneumonia in the left lower lung. Electronically Signed   By: Donavan Foil M.D.   On: 01/21/2020 00:11   (Echo, Carotid, EGD, Colonoscopy, ERCP)    Subjective: Patient seen and examined.  "I want to go home, my son will help me out and I think I will be all right" blood sugars are fairly stable.  Has some leg swelling mostly on the dorsum of the foot.  Denies any nausea vomiting.  Denies any chest pain.  Eating well.   Discharge Exam: Vitals:   01/30/20 0807 01/30/20 1214  BP: Marland Kitchen)  136/98 (!) 150/76  Pulse: 72 79  Resp: 17 17  Temp: 98.3 F (36.8 C) 98.3 F (36.8 C)  SpO2: 100% 99%   Vitals:   01/30/20 0027 01/30/20 0603 01/30/20 0807 01/30/20 1214  BP: (!) 105/56 (!) 130/58 (!) 136/98 (!) 150/76  Pulse: 90 67 72 79  Resp: 20 17 17 17   Temp: 98.6 F (37 C) 99.2 F (37.3 C) 98.3 F (36.8 C) 98.3 F (36.8 C)  TempSrc: Oral Oral    SpO2: 97% 92% 100% 99%  Weight:  93.4 kg    Height:        General: Pt is alert, awake, not in acute distress, on room air.  Walking with minimal support. Cardiovascular: RRR, S1/S2 +, no rubs, no gallops Respiratory: CTA bilaterally, no wheezing, no rhonchi Abdominal: Soft, NT, ND, bowel sounds + Extremities: no edema, no cyanosis Has trace edema on the dorsum of the foot with no tenderness.   The results of significant diagnostics from this hospitalization (including imaging, microbiology, ancillary and laboratory) are listed below for reference.     Microbiology: Recent Results (from the past 240 hour(s))  SARS Coronavirus 2 by RT PCR (hospital order,  performed in [facility] hospital lab) Nasopharyngeal Nasopharyngeal Swab     Status: None   Collection Time: 01/21/20 12:45 AM   Specimen: Nasopharyngeal Swab  Result Value Ref Range Status   SARS Coronavirus 2 NEGATIVE NEGATIVE Final    Comment: (NOTE) SARS-CoV-2 target nucleic acids are NOT DETECTED.  The SARS-CoV-2 RNA is generally detectable in upper and lower respiratory specimens during the acute phase of infection. The lowest concentration of SARS-CoV-2 viral copies this assay can detect is 250 copies / mL. A negative result does not preclude SARS-CoV-2 infection and should not be used as the sole basis for treatment or other patient management decisions.  A negative result may occur with improper specimen collection / handling, submission of specimen other than nasopharyngeal swab, presence of viral mutation(s) within the areas targeted by this assay, and inadequate number of viral copies (<250 copies / mL). A negative result must be combined with clinical observations, patient history, and epidemiological information.  Fact Sheet for Patients:   StrictlyIdeas.no  Fact Sheet for Healthcare Providers: BankingDealers.co.za  This test is not yet approved or  cleared by the [place] FDA and has been authorized for detection and/or diagnosis of SARS-CoV-2 by FDA under an Emergency Use Authorization (EUA).  This EUA will remain in effect (meaning this test can be used) for the duration of the COVID-19 declaration under Section 564(b)(1) of the Act, 21 U.S.C. section 360bbb-3(b)(1), unless the authorization is terminated or revoked sooner.  Performed at [facility], [address] [place], [place] [address]   CULTURE, BLOOD (ROUTINE X 2) w Reflex to ID Panel     Status: None   Collection Time: 01/21/20  3:24 AM   Specimen: BLOOD RIGHT FOREARM  Result Value Ref Range Status   Specimen Description BLOOD RIGHT  FOREARM  Final   Special Requests   Final    BOTTLES DRAWN AEROBIC AND ANAEROBIC Blood Culture adequate volume   Culture   Final    NO GROWTH 5 DAYS Performed at [facility], [address], [place], [place] [address]    Report Status 01/26/2020 FINAL  Final  CULTURE, BLOOD (ROUTINE X 2) w Reflex to ID Panel     Status: None   Collection Time: 01/21/20  3:24 AM   Specimen: BLOOD  Result Value Ref Range Status   Specimen Description BLOOD LEFT ANTECUBITAL  Final   Special Requests   Final    BOTTLES DRAWN AEROBIC AND ANAEROBIC Blood Culture adequate volume   Culture   Final    NO GROWTH 5 DAYS Performed at [facility], [address] [place], [place] [address]    Report Status 01/26/2020 FINAL  Final  Urine Culture     Status: None   Collection Time: 01/21/20  5:06 AM   Specimen: Urine, Random  Result Value Ref Range Status   Specimen Description   Final    URINE, RANDOM Performed at [facility], [address], [place], [place] [address]    Special Requests   Final    NONE Performed at [facility], [address] [place], [place] [address]    Culture   Final    NO GROWTH Performed at [facility], [place] [address], [place], [place] [address]    Report Status 01/22/2020 FINAL  Final  Culture, respiratory (non-expectorated)     Status: None   Collection Time: 01/23/20  6:00 PM   Specimen: Tracheal Aspirate; Respiratory  Result Value Ref Range Status   Specimen Description   Final    TRACHEAL ASPIRATE Performed at [facility], [address], [place], [place] [address]    Special Requests   Final    NONE Performed at [facility], [address] [place], [place] [address]    Gram Stain   Final    FEW WBC PRESENT, PREDOMINANTLY PMN MODERATE GRAM NEGATIVE RODS FEW GRAM POSITIVE COCCI RARE GRAM POSITIVE RODS    Culture   Final    RARE Normal respiratory flora-no Staph aureus or Pseudomonas  seen Performed at [facility], [address], [place], [place] [address]    Report Status 01/26/2020 FINAL  Final  Culture, blood (routine x 2)     Status: None   Collection Time: 01/23/20  6:45 PM   Specimen: BLOOD  Result Value Ref Range Status   Specimen Description BLOOD BLOOD LEFT HAND  Final   Special Requests   Final    BOTTLES DRAWN AEROBIC AND ANAEROBIC Blood Culture adequate volume   Culture   Final    NO GROWTH 5 DAYS Performed at [facility], [address] [place], [place] [address]    Report Status 01/28/2020 FINAL  Final  Culture, blood (routine x 2)     Status: None   Collection Time: 01/23/20  7:56 PM   Specimen: BLOOD  Result Value Ref Range Status   Specimen Description BLOOD BLOOD RIGHT HAND  Final   Special Requests   Final    BOTTLES DRAWN AEROBIC AND ANAEROBIC Blood Culture results may not be optimal due to an excessive volume of blood received in culture bottles   Culture   Final    NO GROWTH 5 DAYS Performed at [facility], [address] [place], [place] [address]    Report Status 01/28/2020 FINAL  Final  MRSA PCR Screening     Status: None   Collection Time: 01/26/20  8:42 AM   Specimen: Nasal Mucosa; Nasopharyngeal  Result Value Ref Range Status   MRSA by PCR NEGATIVE NEGATIVE Final    Comment:        The GeneXpert MRSA Assay (FDA approved for NASAL specimens only), is one component of a comprehensive MRSA colonization surveillance program. It is not intended to diagnose  MRSA infection nor to guide or monitor treatment for MRSA infections. Performed at [facility], [address] [place], [place] [address]      Labs: BNP (last 3 results) No results for input(s): BNP in the last 8760 hours. Basic Metabolic Panel: Recent Labs  Lab 01/24/20 0704 01/24/20 1140 01/25/20 0837 01/25/20 1241 01/26/20 0531 01/27/20 0440 01/28/20 0518 01/29/20 0427 01/30/20 0636  NA 146*   < >   < >  --  150*  144 148* 146* 143  K 3.9   < >   < >  --  3.5 3.0* 3.6 3.6 2.9*  CL 107   < >   < >  --  111 104 110 109 102  CO2 26   < >   < >  --  32 28 29 29 28   GLUCOSE 141*   < >   < >  --  161* 219* 172* 205* 255*  BUN 32*   < >   < >  --  31* 20 11 7 8   CREATININE 2.56*   < >   < >  --  1.42* 1.27* 0.95 0.85 0.89  CALCIUM 8.3*   < >   < >  --  8.5* 8.6* 8.5* 8.5* 8.9  MG 1.7  --   --  1.5* 2.3 1.8 1.7  --  1.4*  PHOS 1.8*  --   --  3.1 3.2  --   --   --  4.4   < > = values in this interval not displayed.   Liver Function Tests: Recent Labs  Lab 01/26/20 0531  ALBUMIN 2.6*   No results for input(s): LIPASE, AMYLASE in the last 168 hours. No results for input(s): AMMONIA in the last 168 hours. CBC: Recent Labs  Lab 01/26/20 0531  WBC 5.2  HGB 8.9*  HCT 27.4*  MCV 97.5  PLT 187   Cardiac Enzymes: No results for input(s): CKTOTAL, CKMB, CKMBINDEX, TROPONINI in the last 168 hours. BNP: Invalid input(s): POCBNP CBG: Recent Labs  Lab 01/29/20 2014 01/30/20 0029 01/30/20 0604 01/30/20 0805 01/30/20 1130  GLUCAP 342* 239* 230* 243* 237*   D-Dimer No results for input(s): DDIMER in the last 72 hours. Hgb A1c No results for input(s): HGBA1C in the last 72 hours. Lipid Profile No results for input(s): CHOL, HDL, LDLCALC, TRIG, CHOLHDL, LDLDIRECT in the last 72 hours. Thyroid function studies No results for input(s): TSH, T4TOTAL, T3FREE, THYROIDAB in the last 72 hours.  Invalid input(s): FREET3 Anemia work up No results for input(s): VITAMINB12, FOLATE, FERRITIN, TIBC, IRON, RETICCTPCT in the last 72 hours. Urinalysis    Component Value Date/Time   COLORURINE YELLOW (A) 01/21/2020 0120   APPEARANCEUR TURBID (A) 01/21/2020 0120   LABSPEC 1.016 01/21/2020 0120   PHURINE 5.0 01/21/2020 0120   GLUCOSEU >=500 (A) 01/21/2020 0120   HGBUR LARGE (A) 01/21/2020 0120   BILIRUBINUR NEGATIVE 01/21/2020 0120   KETONESUR 80 (A) 01/21/2020 0120   PROTEINUR 100 (A) 01/21/2020 0120    NITRITE NEGATIVE 01/21/2020 0120   LEUKOCYTESUR NEGATIVE 01/21/2020 0120   Sepsis Labs Invalid input(s): PROCALCITONIN,  WBC,  LACTICIDVEN Microbiology Recent Results (from the past 240 hour(s))  SARS Coronavirus 2 by RT PCR (hospital order, performed in [facility] hospital lab) Nasopharyngeal Nasopharyngeal Swab     Status: None   Collection Time: 01/21/20 12:45 AM   Specimen: Nasopharyngeal Swab  Result Value Ref Range Status   SARS Coronavirus 2 NEGATIVE NEGATIVE  Final    Comment: (NOTE) SARS-CoV-2 target nucleic acids are NOT DETECTED.  The SARS-CoV-2 RNA is generally detectable in upper and lower respiratory specimens during the acute phase of infection. The lowest concentration of SARS-CoV-2 viral copies this assay can detect is 250 copies / mL. A negative result does not preclude SARS-CoV-2 infection and should not be used as the sole basis for treatment or other patient management decisions.  A negative result may occur with improper specimen collection / handling, submission of specimen other than nasopharyngeal swab, presence of viral mutation(s) within the areas targeted by this assay, and inadequate number of viral copies (<250 copies / mL). A negative result must be combined with clinical observations, patient history, and epidemiological information.  Fact Sheet for Patients:   StrictlyIdeas.no  Fact Sheet for Healthcare Providers: BankingDealers.co.za  This test is not yet approved or  cleared by the [place] FDA and has been authorized for detection and/or diagnosis of SARS-CoV-2 by FDA under an Emergency Use Authorization (EUA).  This EUA will remain in effect (meaning this test can be used) for the duration of the COVID-19 declaration under Section 564(b)(1) of the Act, 21 U.S.C. section 360bbb-3(b)(1), unless the authorization is terminated or revoked sooner.  Performed at [facility], [address] [place], [place] [address]   CULTURE, BLOOD (ROUTINE X 2) w Reflex to ID Panel     Status: None   Collection Time: 01/21/20  3:24 AM   Specimen: BLOOD RIGHT FOREARM  Result Value Ref Range Status   Specimen Description BLOOD RIGHT FOREARM  Final   Special Requests   Final    BOTTLES DRAWN AEROBIC AND ANAEROBIC Blood Culture adequate volume   Culture   Final    NO GROWTH 5 DAYS Performed at [facility], [address] [place], [place] [address]    Report Status 01/26/2020 FINAL  Final  CULTURE, BLOOD (ROUTINE X 2) w Reflex to ID Panel     Status: None   Collection Time: 01/21/20  3:24 AM   Specimen: BLOOD  Result Value Ref Range Status   Specimen Description BLOOD LEFT ANTECUBITAL  Final   Special Requests   Final    BOTTLES DRAWN AEROBIC AND ANAEROBIC Blood Culture adequate volume   Culture   Final    NO GROWTH 5 DAYS Performed at [facility], [address], [place], [place] [address]    Report Status 01/26/2020 FINAL  Final  Urine Culture     Status: None   Collection Time: 01/21/20  5:06 AM   Specimen: Urine, Random  Result Value Ref Range Status   Specimen Description   Final    URINE, RANDOM Performed at [facility], [address] [place], [place] [address]    Special Requests   Final    NONE Performed at [facility], [address] [place], [place] [address]    Culture   Final    NO GROWTH Performed at [facility], [place] [address], [place], [place] [address]    Report Status 01/22/2020 FINAL  Final  Culture, respiratory (non-expectorated)     Status: None   Collection Time: 01/23/20  6:00 PM   Specimen: Tracheal Aspirate; Respiratory  Result Value Ref Range Status   Specimen Description   Final    TRACHEAL ASPIRATE Performed at [facility], [address], [place], [place] [address]    Special Requests   Final    NONE Performed  at [facility], [address]  [place], [place] [address]    Gram Stain   Final    FEW WBC PRESENT, PREDOMINANTLY PMN MODERATE GRAM NEGATIVE RODS FEW GRAM POSITIVE COCCI RARE GRAM POSITIVE RODS    Culture   Final    RARE Normal respiratory flora-no Staph aureus or Pseudomonas seen Performed at [facility], [address], [place], [place] [address]    Report Status 01/26/2020 FINAL  Final  Culture, blood (routine x 2)     Status: None   Collection Time: 01/23/20  6:45 PM   Specimen: BLOOD  Result Value Ref Range Status   Specimen Description BLOOD BLOOD LEFT HAND  Final   Special Requests   Final    BOTTLES DRAWN AEROBIC AND ANAEROBIC Blood Culture adequate volume   Culture   Final    NO GROWTH 5 DAYS Performed at [facility], [address] [place], [place] [address]    Report Status 01/28/2020 FINAL  Final  Culture, blood (routine x 2)     Status: None   Collection Time: 01/23/20  7:56 PM   Specimen: BLOOD  Result Value Ref Range Status   Specimen Description BLOOD BLOOD RIGHT HAND  Final   Special Requests   Final    BOTTLES DRAWN AEROBIC AND ANAEROBIC Blood Culture results may not be optimal due to an excessive volume of blood received in culture bottles   Culture   Final    NO GROWTH 5 DAYS Performed at [facility], [address] [place], [place] [address]    Report Status 01/28/2020 FINAL  Final  MRSA PCR Screening     Status: None   Collection Time: 01/26/20  8:42 AM   Specimen: Nasal Mucosa; Nasopharyngeal  Result Value Ref Range Status   MRSA by PCR NEGATIVE NEGATIVE Final    Comment:        The GeneXpert MRSA Assay (FDA approved for NASAL specimens only), is one component of a comprehensive MRSA colonization surveillance program. It is not intended to diagnose MRSA infection nor to guide or monitor treatment for MRSA infections. Performed at [facility], [address], [place], [place] [address]      Time coordinating discharge:  40  minutes  SIGNED:   Barb Merino, MD  [facility] 01/30/2020, 2:46 PM

## 2020-01-30 NOTE — Progress Notes (Signed)
Per protocol pt to be placed on low bed d/t previous fall this admission. Pt refuses use of Low bed. Pt educated on fall risk and safety measures. Pt demonstrates knowledge in use of call bell to request assistance in and out of bed.

## 2020-01-30 NOTE — Progress Notes (Addendum)
Physical Therapy Treatment Patient Details Name: Natalie [name] MRN: [id number] DOB: 1961-01-15 Today's Date: 01/30/2020    History of Present Illness 59 year old female with history of type 2 diabetes apparently on Trulicity and Jardiance, recently not taking medicine because they were too expensive presented to the emergency room on 8/3 with unresponsiveness and hypoglycemia.  Patient recently moved from [place], no insurance here and has been out of insulin for some time.  EMS found her with saturation 70% requiring nonrebreather mask, blood glucose 570.  Patient was encephalopathic with pinpoint pupils.  In the ER, she received dose of Narcan with improvement in mentation.  Insulin GTT started and admitted to ICU. Intubated 8/5 - 8/8.    PT Comments    Pt in bed upon entry, talking on personal phone, agreeable to participate. Heavy effort for STS c RW, but no lob, no asssit needed. Pt AMB twice in room c RW, then after mask is obtained is able to progress distance to >22ft into hallway. Stamina remain limited but is improving.    Follow Up Recommendations  SNF;Supervision/Assistance - 24 hour     Equipment Recommendations  Rolling walker with 5" wheels    Recommendations for Other Services       Precautions / Restrictions Precautions Precautions: Fall    Mobility  Bed Mobility               General bed mobility comments: seated EOB upon entry  Transfers Overall transfer level: Modified independent Equipment used: Rolling walker (2 wheeled) Transfers: Sit to/from Stand Sit to Stand: Min guard            Ambulation/Gait   Gait Distance (Feet): 85 Feet (62ft, 26ft, 28ft) Assistive device: Rolling walker (2 wheeled) Gait Pattern/deviations: Step-through pattern Gait velocity: 0.2m/s   General Gait Details: slow, labored effort, but appears steady with RW   Stairs             Wheelchair Mobility    Modified Rankin (Stroke Patients  Only)       Balance                                            Cognition Arousal/Alertness: Awake/alert                                            Exercises      General Comments        Pertinent Vitals/Pain Pain Assessment: No/denies pain    Home Living                      Prior Function            PT Goals (current goals can now be found in the care plan section) Acute Rehab PT Goals Patient Stated Goal: get strong/safe enough to go home PT Goal Formulation: With patient Time For Goal Achievement: 02/10/20 Potential to Achieve Goals: Fair Progress towards PT goals: Progressing toward goals    Frequency    Min 2X/week      PT Plan Current plan remains appropriate    Co-evaluation              AM-PAC PT "6 Clicks" Mobility   Outcome Measure  Help needed turning from your  back to your side while in a flat bed without using bedrails?: A Little Help needed moving from lying on your back to sitting on the side of a flat bed without using bedrails?: A Little Help needed moving to and from a bed to a chair (including a wheelchair)?: A Little Help needed standing up from a chair using your arms (e.g., wheelchair or bedside chair)?: A Little Help needed to walk in hospital room?: A Little Help needed climbing 3-5 steps with a railing? : A Lot 6 Click Score: 17    End of Session Equipment Utilized During Treatment: Gait belt Activity Tolerance: Patient limited by fatigue Patient left: with chair alarm set;with call bell/phone within reach Nurse Communication: Mobility status PT Visit Diagnosis: Muscle weakness (generalized) (M62.81);Difficulty in walking, not elsewhere classified (R26.2);Unsteadiness on feet (R26.81)      01/29/20 1400  PT Time Calculation  PT Start Time (ACUTE ONLY) 1300  PT Stop Time (ACUTE ONLY) 1315  PT Time Calculation (min) (ACUTE ONLY) 15 min  PT General Charges  $$ ACUTE PT  VISIT 1 Visit  PT Treatments  $Therapeutic Exercise 8-22 mins            12:44 PM, 01/30/20 [name], PT, DPT Physical Therapist - [facility]  [phone] ([place])    Rice C 01/30/2020, 12:43 PM

## 2020-01-30 NOTE — Progress Notes (Signed)
Inpatient Diabetes Program Recommendations  AACE/ADA: New Consensus Statement on Inpatient Glycemic Control (2015)  Target Ranges:  Prepandial:   less than 140 mg/dL      Peak postprandial:   less than 180 mg/dL (1-2 hours)      Critically ill patients:  140 - 180 mg/dL   Results for Natalie [name], Natalie [name] (MRN [id number]) as of 01/30/2020 07:37  Ref. Range 01/29/2020 08:22 01/29/2020 12:40 01/29/2020 17:08 01/29/2020 20:14  Glucose-Capillary Latest Ref Range: 70 - 99 mg/dL 240 (H)  8 units NOVOLOG +  12 units LANTUS 199 (H) 280 (H)  9 units NOVOLOG  342 (H)  4 units NOVOLOG +  14 units LANTUS   Results for ROSALENE, WARDROP (MRN [id number]) as of 01/30/2020 07:37  Ref. Range 01/30/2020 06:04  Glucose-Capillary Latest Ref Range: 70 - 99 mg/dL 230 (H)   Diabetes history:DM2  Outpatient Diabetes meds:Trulicity 1.5 mg Qweek               Jardiance 25 mg daily               Metformin 500 mg BID            (out of DM medicationssince May)   Current Order: Lantus 14 units BID      Novolog Sensitive Correction Scale/ SSI (0-9 units) TID AC + HS    Novolog 7 units TID with meals     MD- Note Lantus increased yesterday.  Pt received a total of 26 units Lantus yesterday--will get total of 28 units Lantus today.  CBG still elevated to 230 this AM.  Please consider:  1. Increase Lantus slightly again to 15 units BID  2. Increase Novolog Meal Coverage to 9 units TID with meals  If discharged on insulin, will need Rx for: Lantus SoloStar pen [phone]), Novolog Flexpen [phone]), and insulin pen needles [phone]).     --Will follow patient during hospitalization--  Wyn Quaker RN, MSN, CDE Diabetes Coordinator Inpatient Glycemic Control Team Team Pager: [phone] (8a-5p)

## 2020-01-30 NOTE — Progress Notes (Addendum)
[place] for Electrolyte Monitoring and Replacement   Recent Labs: Potassium (mmol/L)  Date Value  01/30/2020 2.9 (L)   Magnesium (mg/dL)  Date Value  01/28/2020 1.7   Calcium (mg/dL)  Date Value  01/30/2020 8.9   Albumin (g/dL)  Date Value  01/26/2020 2.6 (L)   Phosphorus (mg/dL)  Date Value  01/26/2020 3.2   Sodium (mmol/L)  Date Value  01/30/2020 143   Corrected Ca: 9.65 mg/dL  Assessment: 58 year old female admitted to the ICU intubated and sedated with DKA on insulin drip which has since resolved. Pt transitioned to subcutaneous insulin. now off bicarbonate drip with resolution of acidosis. Pharmacy to manage electrolytes.  Goal of Therapy:  Electrolytes WNL  Plan:   Replace K with Klor-Con packet 40 mEq q4h x 2 doses  Replace Mg with 2 g IV x 1 dose  Re-check electrolytes with morning labs  Benn Moulder, PharmD Pharmacy Resident  01/30/2020 8:08 AM

## 2020-02-18 ENCOUNTER — Telehealth: Payer: Self-pay

## 2020-02-18 NOTE — Telephone Encounter (Signed)
Contacted patient for lung CT screening clinic after receiving referral from Lennie Muckle, [place].  I spoke to patient about program.  I also spoke to her about her current hospitalization in an attempt to learn if she was strong enough to participate in the program at this time.  She shared she is still having great difficulty walking and uses her cane at all times and also frequently has to hold onto the walls to avoid falling.  She sates she is working with physical therapy, but that has been limited to to poor circulation in her feet and some fluid buildup.  We decided that the best plan would be for our program to call her back in a few months to see how she was doing to determine if she was appropriate.  I explained that one goal of the program was to assure IF a cancer was found on the CT scan, that the patient would be able to endure treatment (surgery or chemo or radiation, etc).  Patient is agreeable to plan and hopes to be at a better place in a few months.

## 2020-03-02 NOTE — Telephone Encounter (Signed)
No problem. We can see her in lung nodule!  Faythe Casa, NP 03/02/2020 9:26 AM

## 2020-03-11 ENCOUNTER — Other Ambulatory Visit: Payer: Self-pay | Admitting: Physician Assistant

## 2020-03-11 ENCOUNTER — Other Ambulatory Visit: Payer: Self-pay | Admitting: Oncology

## 2020-03-11 DIAGNOSIS — R9389 Abnormal findings on diagnostic imaging of other specified body structures: Secondary | ICD-10-CM

## 2020-03-11 DIAGNOSIS — R911 Solitary pulmonary nodule: Secondary | ICD-10-CM

## 2020-03-11 NOTE — Progress Notes (Signed)
  Pulmonary Nodule Clinic Telephone Note [place]   Received referral from [facility]  HPI: Mrs. Curb is a 59 year old female with past medical history significant for type 2 diabetes, bipolar disorder who presented recently to the emergency room for unresponsiveness and hyperglycemia.  She was admitted and treated for DKA.  She was intubated and eventually discharged.  Review and Recommendations: I personally reviewed all patient's previous imaging including most recent chest x-ray from 01/21/2020.  There was persistent left midlung nodular density and radiology recommended a contrast enhanced CT of the chest to assess for underlying malignancy.  I recommend follow-up with noncontrast Ct chest in the next 1 to 2 weeks.  Social History:   Tobacco Use: Low Risk   . Smoking Tobacco Use: Never Smoker  . Smokeless Tobacco Use: Never Used     High risk factors include: History of heavy smoking, exposure to asbestos, radium or uranium, personal family history of lung cancer, older age, sex (females greater than males), race (black and native [place] greater than weight), marginal speculation, upper lobe location, multiplicity (less than 5 nodules increases risk for malignancy) and emphysema and/or pulmonary fibrosis.   This recommendation follows the consensus statement: Guidelines for Management of Incidental Pulmonary Nodules Detected on CT Images: From the Fleischner Society 2017; Radiology 2017; 284:228-243.    I have placed order for CT scan without contrast to be completed in the next 1 to 2 weeks..  Disposition: Order placed for repeat CT chest. Will notify Lenox Ponds in scheduling. [place] to call patient with appointment date and time. Return to pulmonary nodule clinic a few days after his repeat imaging to discuss results and plan moving forward.  Faythe Casa, NP 03/11/2020 12:38 PM

## 2020-03-16 ENCOUNTER — Telehealth: Payer: Self-pay | Admitting: *Deleted

## 2020-03-16 NOTE — Telephone Encounter (Signed)
Referral received for pt to be seen in Lung Nodule Clinic for further workup of incidental lung nodule with follow up CT scan. Left message with patient to call back to discuss clinic and review recommendations and upcoming appts including follow up CT scan and visit with Rulon Abide, NP to discuss results. Awaiting call back.

## 2020-03-19 ENCOUNTER — Other Ambulatory Visit: Payer: Medicaid Other

## 2020-03-23 ENCOUNTER — Ambulatory Visit: Payer: Medicaid Other | Admitting: Oncology

## 2020-03-25 ENCOUNTER — Ambulatory Visit: Payer: Medicaid Other | Admitting: Oncology

## 2020-04-09 ENCOUNTER — Other Ambulatory Visit: Payer: Self-pay | Admitting: Oncology

## 2020-04-09 DIAGNOSIS — R9389 Abnormal findings on diagnostic imaging of other specified body structures: Secondary | ICD-10-CM

## 2020-04-15 ENCOUNTER — Telehealth: Payer: Self-pay | Admitting: *Deleted

## 2020-04-15 NOTE — Telephone Encounter (Signed)
Spoke with pt regarding referral to the lung nodule clinic to follow up abnormal chest xray. Reviewed upcoming appts in the lung nodule clinic. Pt stated that has an appt on 11/8 for a scan already. Per [facility], pt has an upcoming encounter for radiology at [facility] on 11/8. Spoke with Percell Locus at [facility] to confirm appt and make sure it wasn't a duplicate appt. Per Percell Locus, pt does not have any upcoming appts scheduled at their clinic. Pt made aware and informed to keep appts with nodule clinic as scheduled. Appts mailed and instructed to call with any further questions or needs.

## 2020-05-03 ENCOUNTER — Other Ambulatory Visit: Payer: Self-pay

## 2020-05-03 ENCOUNTER — Ambulatory Visit
Admission: RE | Admit: 2020-05-03 | Discharge: 2020-05-03 | Disposition: A | Discharge status: Home / Self Care | Payer: Medicaid Other | Source: Ambulatory Visit | Attending: Oncology | Admitting: Oncology

## 2020-05-03 ENCOUNTER — Other Ambulatory Visit (HOSPITAL_COMMUNITY): Payer: Self-pay | Admitting: Oncology

## 2020-05-03 DIAGNOSIS — R918 Other nonspecific abnormal finding of lung field: Secondary | ICD-10-CM

## 2020-05-03 DIAGNOSIS — R9389 Abnormal findings on diagnostic imaging of other specified body structures: Secondary | ICD-10-CM | POA: Diagnosis not present

## 2020-05-03 IMAGING — CT CT CHEST W/O CM
2 of 5 series · 15 of 36 positions shown, 18 images · non-contrast
Comparison: Chest radiograph [DATE]

CLINICAL DATA: Abnormal chest radiograph

EXAM:
CT CHEST WITHOUT CONTRAST
TECHNIQUE: Multidetector CT imaging of the chest was performed following the
standard protocol without IV contrast.

[Series 4: chest 1.00 · axial · 0.64mm/px · z∈[-1213,-944]mm · 12 of 319 slices shown, 15 images]
[im 25/319  mediastinal]
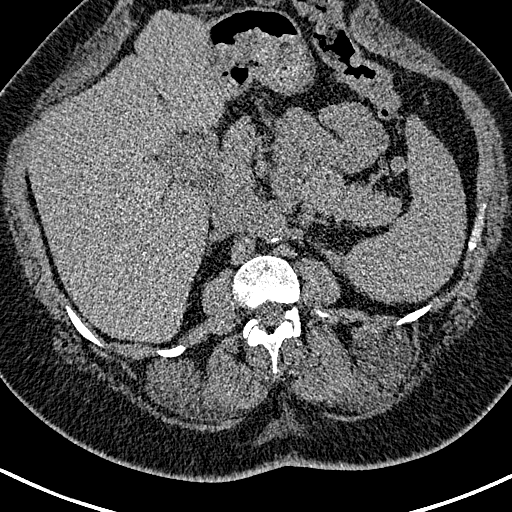
[im 25/319  lung]
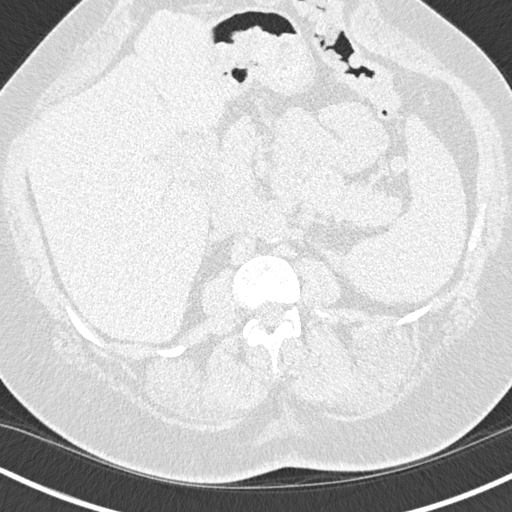
[im 49/319  lung]
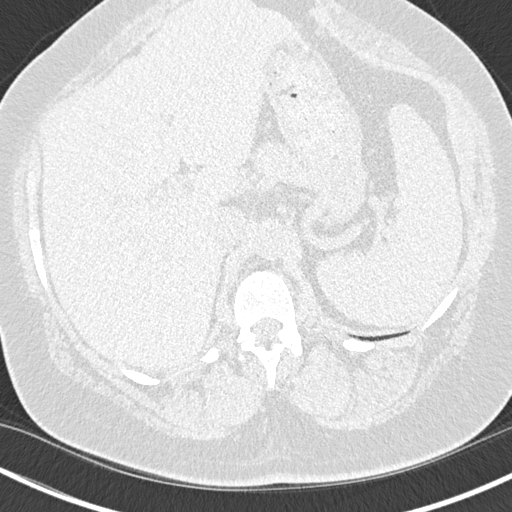
[im 74/319  lung]
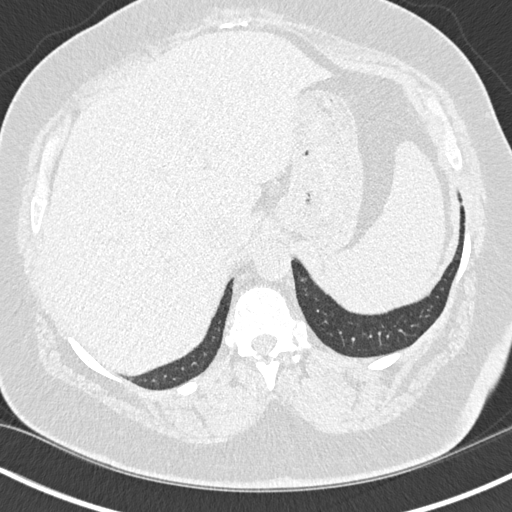
[im 98/319  lung]
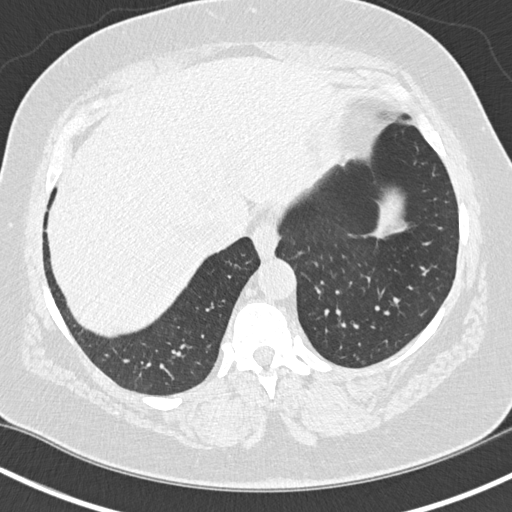
[im 123/319  mediastinal]
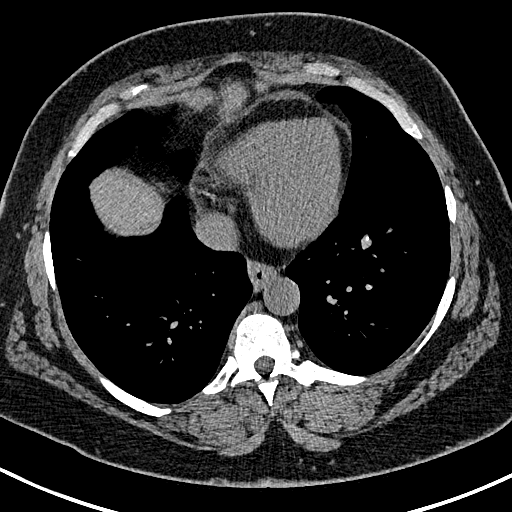
[im 123/319  lung]
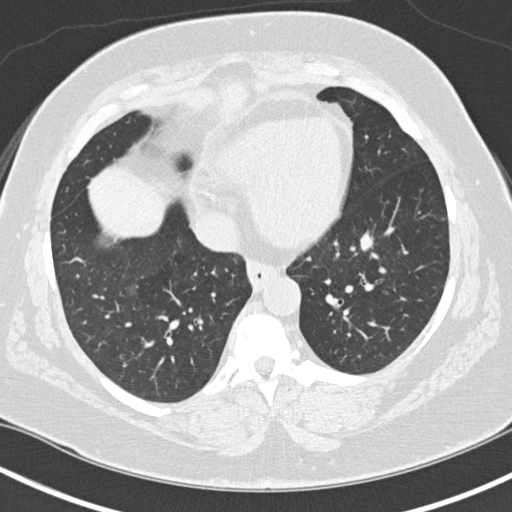
[im 147/319  lung]
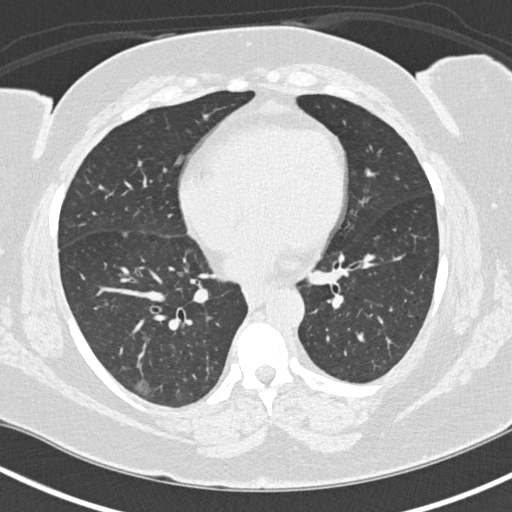
[im 172/319  lung]
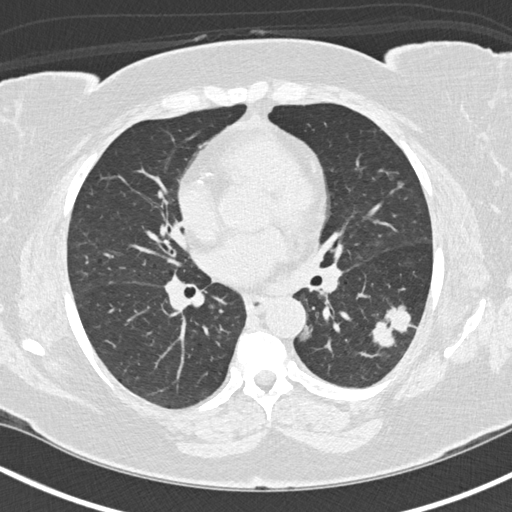
[im 196/319  lung]
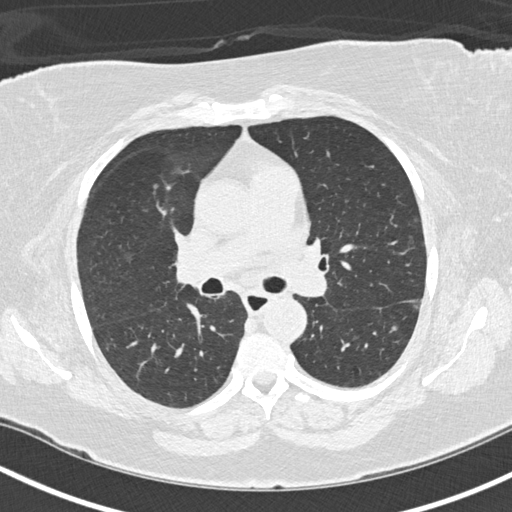
[im 221/319  mediastinal]
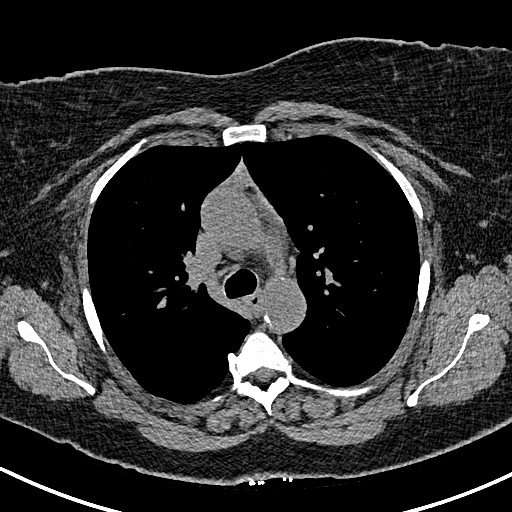
[im 221/319  lung]
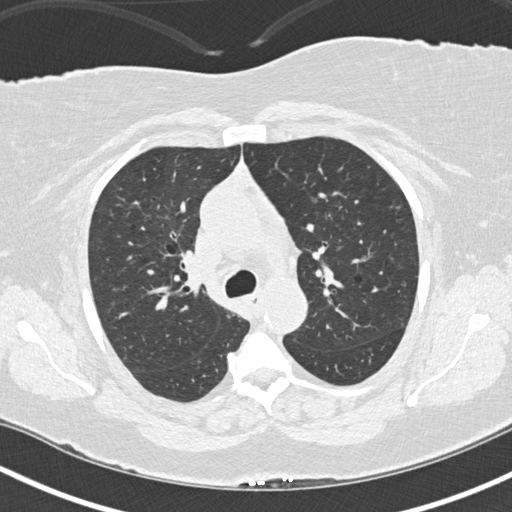
[im 245/319  lung]
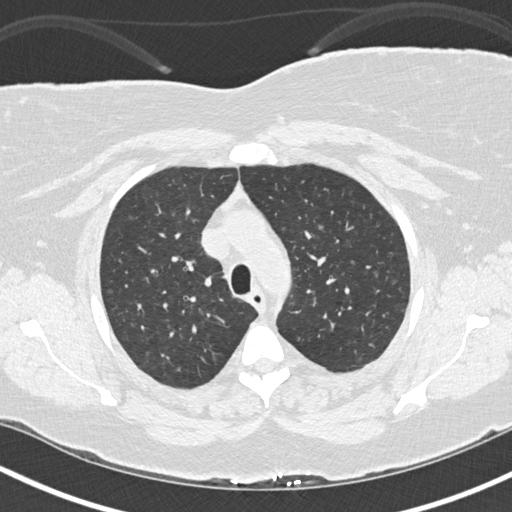
[im 270/319  lung]
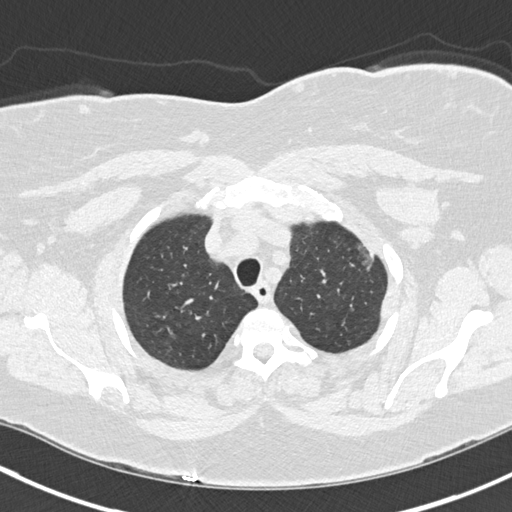
[im 294/319  lung]
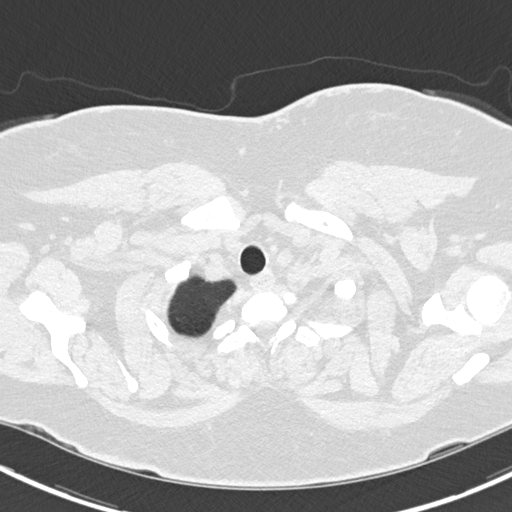

[Series 5: coronals chest 2.00 cor · coronal · 0.62mm/px · 3 of 151 slices shown]
[im 31/151  lung]
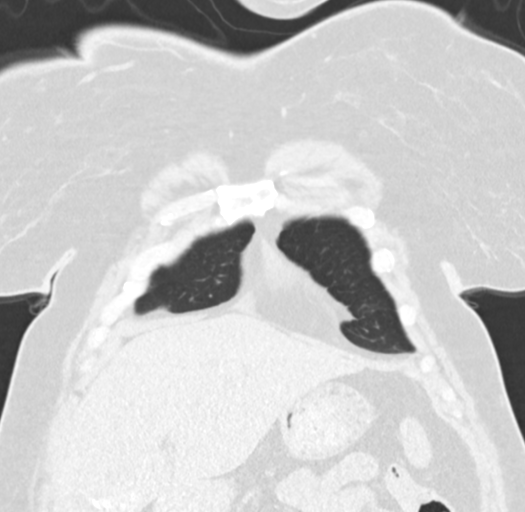
[im 61/151  lung]
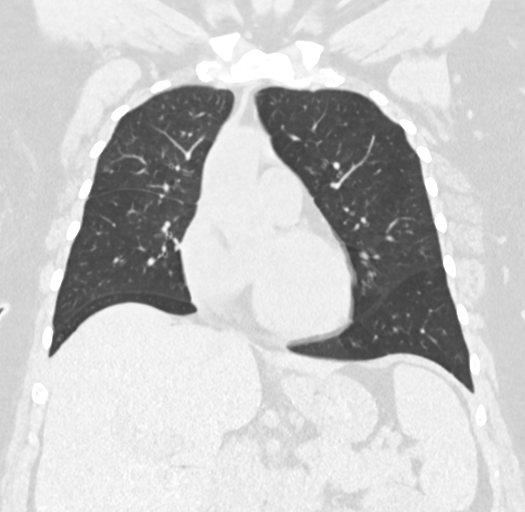
[im 91/151  lung]
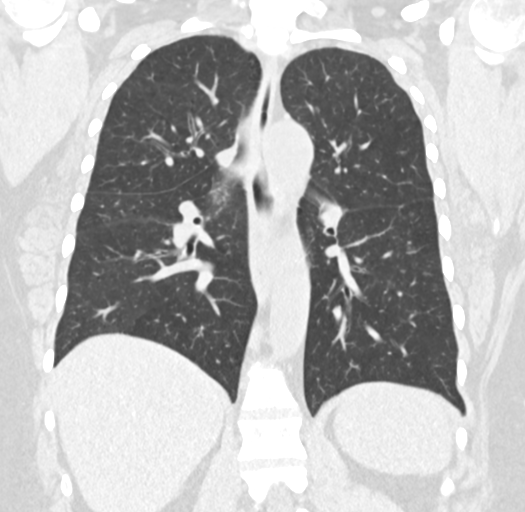

[15 of 36 positions shown; findings below may reference images not displayed]

FINDINGS: Cardiovascular: Abdominal aorta is normal caliber with
atherosclerotic calcification. There is no retroperitoneal or
periportal lymphadenopathy. No pelvic lymphadenopathy.

Mediastinum/Nodes: No axillary or supraclavicular adenopathy. No
mediastinal or hilar adenopathy. No pericardial fluid. Esophagus
normal.

Lungs/Pleura: In the superior segment of the LEFT lower lobe lobular
mass measures 3.1 by 2.1 cm (image 71/CT series 3).

Within the LEFT lower lobe 11 mm nodule (image 101/series 3).
Additional 4 mm pulmonary nodule in the LEFT lower lobe on image
84).

Angular density in the superior aspect of the LEFT upper lobe
measures 14 mm.

There is small centrilobular ground-glass nodules within the upper
lobes and lower lobes.

The several larger ground-glass nodules are present within the lower
lobes. For example 9 mm ground-glass nodule in the RIGHT lower lobe
image 87/series 3. 6 mm ground-glass nodule on image 64 in the RIGHT
lower lobe. Similar rounded ground-glass nodules in the LEFT lower
lobe

Upper Abdomen: Limited view of the liver, kidneys, pancreas are
unremarkable. Normal adrenal glands.

Musculoskeletal: No aggressive osseous lesion.
IMPRESSION: 1. Lobular mass in the LEFT lower lobe is most concerning for
bronchogenic carcinoma. Recommend FDG PET scan for staging and
biopsy planning.
2. Two pulmonary nodules in the LEFT lower lobe are concerning for
synchronous bronchogenic carcinoma versus metastasis.
3. No mediastinal lymphadenopathy identified on noncontrast exam.
4. Bilateral scattered ground-glass nodules which are small in the
upper lobes and larger and more defined in the lower lobes.
Differential includes inflammatory / infectious process versus
multifocal neoplastic process.

## 2020-05-03 NOTE — Progress Notes (Signed)
Re: Abnormal Ct scan   Results from recent CT chest shows lobular mass in the left lower lobe concerning for bronchogenic carcinoma.  Recommend PET scan for staging and biopsy.  There are also 2 pulmonary nodules in the left lower lobe that are concerning for bronchogenic carcinoma versus metastasis.   PET scan orders placed.  Thoracic lung navigator [facility] notified.  Faythe Casa, NP 05/03/2020 12:14 PM

## 2020-05-04 ENCOUNTER — Inpatient Hospital Stay: Payer: Medicaid Other | Attending: Oncology | Admitting: Oncology

## 2020-05-04 ENCOUNTER — Encounter: Payer: Self-pay | Admitting: *Deleted

## 2020-05-04 DIAGNOSIS — N949 Unspecified condition associated with female genital organs and menstrual cycle: Secondary | ICD-10-CM | POA: Insufficient documentation

## 2020-05-04 DIAGNOSIS — Z87891 Personal history of nicotine dependence: Secondary | ICD-10-CM | POA: Insufficient documentation

## 2020-05-04 DIAGNOSIS — R918 Other nonspecific abnormal finding of lung field: Secondary | ICD-10-CM | POA: Insufficient documentation

## 2020-05-04 DIAGNOSIS — M629 Disorder of muscle, unspecified: Secondary | ICD-10-CM | POA: Insufficient documentation

## 2020-05-04 NOTE — Progress Notes (Signed)
Pulmonary Nodule Clinic Consult note [facility]  Telephone:([phone] [phone]  Patient Care Team: Center, [facility] [place] as PCP - General   Name of the patient: Natalie [name]  [id number]  July 30, 1960   Date of visit: 05/04/2020   Diagnosis- Lung Nodule  Virtual Visit via Telephone Note   I connected with Natalie [name] on 05/04/20 at 10:30am by telephone visit and verified that I am speaking with the correct person using two identifiers.   I discussed the limitations, risks, security and privacy concerns of performing an evaluation and management service by telemedicine and the availability of in-person appointments. I also discussed with the patient that there may be a patient responsible charge related to this service. The patient expressed understanding and agreed to proceed.   Other persons participating in the visit and their role in the encounter:   Patient's location: Home  Provider's location: Clinic   Chief complaint/ Reason for visit- Pulmonary Nodule Clinic Initial Visit  Past Medical History:  Patient is managed/referred by Natalie [name] is a 59 year old female with past medical history significant for type 2 diabetes, bipolar disorder who presented recently to the emergency room for unresponsiveness and hyperglycemia.  She was admitted and treated for DKA.  She was intubated and eventually discharged.  I personally reviewed all patient's previous imaging including most recent chest x-ray from 01/21/2020.  There was persistent left midlung nodular density and radiology recommended a contrast enhanced CT of the chest to assess for underlying malignancy.  I recommend follow-up with noncontrast Ct chest in the next 1 to 2 weeks.  Interval history-she presents today to discuss recent CT scan.  Natalie [name] is currently a non-smoker. She quit smoking approximately 9 years ago. She smoked 2 packs of cigarettes per day for  about 35 years. She denies smokeless tobacco. She denies any drug abuse.  She denies any known occupational exposures. She worked at the [employer] and is currently retired. 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  She denies any personal history of cancer. Her aunt died of neck cancer.  She is currently doing well and denies any concerns. She specifically denies any respiratory concerns or cough. Since discharge from the hospital she is managing her diabetes with insulin. She checks her blood sugar several times per day. Denies any neurologic complaints. Denies recent fevers or illnesses. Denies any easy bleeding or bruising. Reports good appetite and denies weight loss. Denies chest pain. Denies any nausea, vomiting, constipation, or diarrhea. Denies urinary complaints. Patient offers no further specific complaints today.   ECOG FS:0 - Asymptomatic  Review of systems- Review of Systems  Constitutional: Negative.  Negative for chills, fever, malaise/fatigue and weight loss.  HENT: Negative for congestion, ear pain and tinnitus.   Eyes: Negative.  Negative for blurred vision and double vision.  Respiratory: Negative.  Negative for cough, sputum production and shortness of breath.   Cardiovascular: Negative.  Negative for chest pain, palpitations and leg swelling.  Gastrointestinal: Negative.  Negative for abdominal pain, constipation, diarrhea, nausea and vomiting.  Genitourinary: Negative for dysuria, frequency and urgency.  Musculoskeletal: Negative for back pain and falls.  Skin: Negative.  Negative for rash.  Neurological: Negative.  Negative for weakness and headaches.  Endo/Heme/Allergies: Negative.  Does not bruise/bleed easily.  Psychiatric/Behavioral: Negative.  Negative for depression. The patient is not nervous/anxious and does not have insomnia.      No Known Allergies  No past medical history on file.   No past surgical  history on file.  Social History   Socioeconomic History  . Marital status: Single    Spouse name: Not on file  . Number of children: Not on file  . Years of education: Not on file  . Highest education level: Not on file  Occupational History  . Not on file  Tobacco Use  . Smoking status: Never Smoker  . Smokeless tobacco: Never Used  Substance and Sexual Activity  . Alcohol use: Not on file  . Drug use: Not on file  . Sexual activity: Not on file  Other Topics Concern  . Not on file  Social History Narrative  . Not on file   Social Determinants of Health   Financial Resource Strain:   . Difficulty of Paying Living Expenses: Not on file  Food Insecurity:   . Worried About Charity fundraiser in the Last Year: Not on file  . Ran Out of Food in the Last Year: Not on file  Transportation Needs:   . Lack of Transportation (Medical): Not on file  . Lack of Transportation (Non-Medical): Not on file  Physical Activity:   . Days of Exercise per Week: Not on file  . Minutes of Exercise per Session: Not on file  Stress:   . Feeling of Stress : Not on file  Social Connections:   . Frequency of Communication with Friends and Family: Not on file  . Frequency of Social Gatherings with Friends and Family: Not on file  . Attends Religious Services: Not on file  . Active Member of Clubs or Organizations: Not on file  . Attends Archivist Meetings: Not on file  . Marital Status: Not on file  Intimate Partner Violence:   . Fear of Current or Ex-Partner: Not on file  . Emotionally Abused: Not on file  . Physically Abused: Not on file  . Sexually Abused: Not on file    No family history on file.   Current Outpatient Medications:  .  atorvastatin (LIPITOR) 40 MG tablet, Take 40 mg by mouth at bedtime., Disp: , Rfl:  .  benztropine (COGENTIN) 1 MG tablet, Take 1 tablet by mouth daily., Disp: , Rfl:  .  citalopram (CELEXA) 20 MG tablet, Take 20 mg by mouth every morning.,  Disp: , Rfl:  .  insulin aspart (NOVOLOG) 100 UNIT/ML FlexPen, Inject 9 Units into the skin 3 (three) times daily with meals., Disp: 15 mL, Rfl: 11 .  insulin glargine (LANTUS) 100 unit/mL SOPN, Inject 0.15 mLs (15 Units total) into the skin 2 (two) times daily., Disp: 15 mL, Rfl: 11 .  latanoprost (XALATAN) 0.005 % ophthalmic solution, Place 1 drop into both eyes at bedtime., Disp: , Rfl:  .  LORazepam (ATIVAN) 1 MG tablet, Take 1 mg by mouth 2 (two) times daily., Disp: , Rfl:  .  Multiple Vitamin (DAILY-VITE MULTIVITAMIN) TABS, Take 1 tablet by mouth daily., Disp: , Rfl:  .  potassium chloride (KLOR-CON) 20 MEQ packet, Take 20 mEq by mouth daily for 14 days., Disp: 14 packet, Rfl: 0 .  TRELEGY ELLIPTA 100-62.5-25 MCG/INH AEPB, Inhale 1 puff into the lungs daily., Disp: , Rfl:  .  ziprasidone (GEODON) 40 MG capsule, Take 1 capsule by mouth at bedtime., Disp: , Rfl:   Physical exam: There were no vitals filed for this visit. Physical Exam Constitutional:      Appearance: Normal appearance.  HENT:  Head: Normocephalic and atraumatic.  Eyes:     Pupils: Pupils are equal, round, and reactive to light.  Cardiovascular:     Rate and Rhythm: Normal rate and regular rhythm.     Heart sounds: Normal heart sounds. No murmur heard.   Pulmonary:     Effort: Pulmonary effort is normal.     Breath sounds: Normal breath sounds. No wheezing.  Abdominal:     General: Bowel sounds are normal. There is no distension.     Palpations: Abdomen is soft.     Tenderness: There is no abdominal tenderness.  Musculoskeletal:        General: Normal range of motion.     Cervical back: Normal range of motion.  Skin:    General: Skin is warm and dry.     Findings: No rash.  Neurological:     Mental Status: She is alert and oriented to person, place, and time.  Psychiatric:        Judgment: Judgment normal.      CMP Latest Ref Rng & Units 01/30/2020  Glucose 70 - 99 mg/dL 255(H)  BUN 6 - 20 mg/dL 8    Creatinine 0.44 - 1.00 mg/dL 0.89  Sodium 135 - 145 mmol/L 143  Potassium 3.5 - 5.1 mmol/L 2.9(L)  Chloride 98 - 111 mmol/L 102  CO2 22 - 32 mmol/L 28  Calcium 8.9 - 10.3 mg/dL 8.9  Total Protein 6.5 - 8.1 g/dL -  Total Bilirubin 0.3 - 1.2 mg/dL -  Alkaline Phos 38 - 126 U/L -  AST 15 - 41 U/L -  ALT 0 - 44 U/L -   CBC Latest Ref Rng & Units 01/26/2020  WBC 4.0 - 10.5 K/uL 5.2  Hemoglobin 12.0 - 15.0 g/dL 8.9(L)  Hematocrit 36 - 46 % 27.4(L)  Platelets 150 - 400 K/uL 187     CT CHEST WO CONTRAST  Result Date: 05/03/2020 CLINICAL DATA:  Abnormal chest radiograph EXAM: CT CHEST WITHOUT CONTRAST TECHNIQUE: Multidetector CT imaging of the chest was performed following the standard protocol without IV contrast. COMPARISON:  Chest radiograph 01/26/2020 FINDINGS: Cardiovascular: Abdominal aorta is normal caliber with atherosclerotic calcification. There is no retroperitoneal or periportal lymphadenopathy. No pelvic lymphadenopathy. Mediastinum/Nodes: No axillary or supraclavicular adenopathy. No mediastinal or hilar adenopathy. No pericardial fluid. Esophagus normal. Lungs/Pleura: In the superior segment of the LEFT lower lobe lobular mass measures 3.1 by 2.1 cm (image 71/CT series 3). Within the LEFT lower lobe 11 mm nodule (image 101/series 3). Additional 4 mm pulmonary nodule in the LEFT lower lobe on image 84). Angular density in the superior aspect of the LEFT upper lobe measures 14 mm. There is small centrilobular ground-glass nodules within the upper lobes and lower lobes. The several larger ground-glass nodules are present within the lower lobes. For example 9 mm ground-glass nodule in the RIGHT lower lobe image 87/series 3. 6 mm ground-glass nodule on image 64 in the RIGHT lower lobe. Similar rounded ground-glass nodules in the LEFT lower lobe Upper Abdomen: Limited view of the liver, kidneys, pancreas are unremarkable. Normal adrenal glands. Musculoskeletal: No aggressive osseous lesion.  IMPRESSION: 1. Lobular mass in the LEFT lower lobe is most concerning for bronchogenic carcinoma. Recommend FDG PET scan for staging and biopsy planning. 2. Two pulmonary nodules in the LEFT lower lobe are concerning for synchronous bronchogenic carcinoma versus metastasis. 3. No mediastinal lymphadenopathy identified on noncontrast exam. 4. Bilateral scattered ground-glass nodules which are small in the upper lobes and larger and  more defined in the lower lobes. Differential includes inflammatory / infectious process versus multifocal neoplastic process. Electronically Signed   By: Suzy Bouchard M.D.   On: 05/03/2020 09:18     Assessment and plan- Patient is a 59 y.o. female who presents to pulmonary nodule clinic for follow-up of incidental lung nodules.  A telephone visit was conducted to review most recent CT scan results.    CT chest without contrast from 05/03/2020 shows a lobular mass in the left lower lobe most concerning for bronchogenic carcinoma.  Recommend PET scan for staging and biopsy planning.  2 pulmonary nodules in the left lower lobe are concerning for synchronous bronchogenic carcinoma versus metastasis.  No mediastinal lymphadenopathy identified.  Bilateral scattered groundglass nodules which are small in the upper lobes and larger and more defined in the lower lobes.  Differentials include inflammatory versus infectious versus multifocal neoplastic process.   Calculating malignancy probability of a pulmonary nodule: Risk factors include: 1.  Age. 2.  Cancer history. 3.  Diameter of pulmonary nodule and mm 4.  Location 5.  Smoking history 6.  Spiculation present   Patient contacted and notified of findings.  She is already set up for a PET scan scheduled for 05/06/2020.  She also will be placed on tumor board and will follow up with medical oncology on 05/07/2020.  Based on her risk factors, this patient is high risk for the development of lung cancer.  She has smoked 2 pack  of cigarettes per day for greater than 35 years.  She quit approximately 9 years ago.  During our visit, we discussed pulmonary nodules are a common incidental finding and are often how lung cancer is discovered.  Lung cancer survival is directly related to the stage at diagnosis.  We discussed that nodules can vary in presentation from solitary pulmonary nodules to masses, 2 groundglass opacities and multiple nodules.  Pulmonary nodules in the majority of cases are benign but the probability of these becoming malignant cannot be undermined.  Early identification of malignant nodules could lead to early diagnosis and increased survival.   We discussed the probability of pulmonary nodules becoming malignant increase with age, pack years of tobacco use, size/characteristics of the nodule and location; with upper lobe involvement being most worrisome.   We discussed the goal of our clinic is to thoroughly evaluate each nodule, developed a comprehensive, individualized plan of care utilizing the most advanced technology and significantly reduce the time from detection to treatment.  A dedicated pulmonary nodule clinic has proven to indeed expedite the detection and treatment of lung cancer.   Patient education in fact sheet provided along with most recent CT scans.  Plan: Reviewed recent CT chest. Discussed personal and family history. Discussed occupational exposure. Patient agreeable to have additional imaging with PET scan on 05/06/2020.  Disposition: Patient to have PET scan on 05/06/2020. Tumor board on 05/06/2020 Follow-up in clinic with Dr. Grayland Ormond on 05/07/2020.   Visit Diagnosis 1. Lung nodule, multiple     Patient expressed understanding and was in agreement with this plan. She also understands that She can call clinic at any time with any questions, concerns, or complaints.   Greater than 50% was spent in counseling and coordination of care with this patient including but not  limited to discussion of the relevant topics above (See A&P) including, but not limited to diagnosis and management of acute and chronic medical conditions.   Thank you for allowing me to participate in the care of  this very pleasant patient.    Jacquelin Hawking, NP [facility] Cell - [phone] Pager- [phone] 05/04/2020 3:16 PM

## 2020-05-04 NOTE — Progress Notes (Signed)
  Oncology Nurse Navigator Documentation  Navigator Location: [facility]c (05/04/20 0800) Referral Date to RadOnc/MedOnc: 05/03/20 (05/04/20 0800) )Navigator Encounter Type: Appt/Treatment Plan Review (05/04/20 0800)   Abnormal Finding Date: 05/03/20 (05/04/20 0800)                     Barriers/Navigation Needs: Coordination of Care (05/04/20 0800)   Interventions: Coordination of Care (05/04/20 0800)   Coordination of Care: Appts;Radiology (05/04/20 0800)       navigation referral received from J. Burns to coordinate appts for patient. Pt scheduled for virtual visit today with Sonia Baller. Appts for PET and medonc consult scheduled and will be given to patient during virtual visit. Will follow up with pt at new pt appt on Friday 05/07/2020            Time Spent with Patient: 30 (05/04/20 0800)

## 2020-05-05 ENCOUNTER — Encounter: Payer: Self-pay | Admitting: *Deleted

## 2020-05-05 NOTE — Progress Notes (Signed)
  Oncology Nurse Navigator Documentation  Navigator Location: [facility] (05/05/20 1300)   )Navigator Encounter Type: Introductory Phone Call (05/05/20 1300)                       Treatment Phase: Abnormal Scans (05/05/20 1300) Barriers/Navigation Needs: Coordination of Care (05/05/20 1300)   Interventions: None Required (05/05/20 1300)         phone call made to patient to check if had any questions regarding upcoming appts. Pt did not answer. Message left on voicemail to introduce to navigator services. Contact info given and instructed to call back if has any questions or needs.              Time Spent with Patient: 15 (05/05/20 1300)

## 2020-05-06 ENCOUNTER — Other Ambulatory Visit: Payer: Self-pay | Admitting: *Deleted

## 2020-05-06 ENCOUNTER — Other Ambulatory Visit: Payer: Medicaid Other

## 2020-05-06 ENCOUNTER — Other Ambulatory Visit: Payer: Self-pay

## 2020-05-06 ENCOUNTER — Encounter
Admission: RE | Admit: 2020-05-06 | Discharge: 2020-05-06 | Disposition: A | Discharge status: Home / Self Care | Payer: Medicaid Other | Source: Ambulatory Visit | Attending: Oncology | Admitting: Oncology

## 2020-05-06 DIAGNOSIS — D259 Leiomyoma of uterus, unspecified: Secondary | ICD-10-CM | POA: Insufficient documentation

## 2020-05-06 DIAGNOSIS — R918 Other nonspecific abnormal finding of lung field: Secondary | ICD-10-CM

## 2020-05-06 DIAGNOSIS — I2510 Atherosclerotic heart disease of native coronary artery without angina pectoris: Secondary | ICD-10-CM | POA: Diagnosis not present

## 2020-05-06 DIAGNOSIS — I700 Atherosclerosis of aorta: Secondary | ICD-10-CM | POA: Insufficient documentation

## 2020-05-06 DIAGNOSIS — M7989 Other specified soft tissue disorders: Secondary | ICD-10-CM

## 2020-05-06 LAB — GLUCOSE, CAPILLARY: Glucose-Capillary: 138 mg/dL — ABNORMAL HIGH (ref 70–99)

## 2020-05-06 IMAGING — CT NM PET TUM IMG INITIAL (PI) SKULL BASE T - THIGH
10 series · 24 of 25 positions shown · non-contrast
Comparison: [DATE] chest CT

CLINICAL DATA: Initial treatment strategy for left lower lobe lung
mass on chest CT.

EXAM:
NUCLEAR MEDICINE PET SKULL BASE TO THIGH
TECHNIQUE: 10.9 mCi F-18 FDG was injected intravenously. Full-ring PET imaging
was performed from the skull base to thigh after the radiotracer. CT
data was obtained and used for attenuation correction and anatomic
localization.
Fasting blood glucose: 138 mg/dl

[Series 3: ct wb 5.0 b30f · axial · 5.0mm · 0.98mm/px · z∈[-216,+651]mm · 3 of 290 slices shown]
[im 1/290]
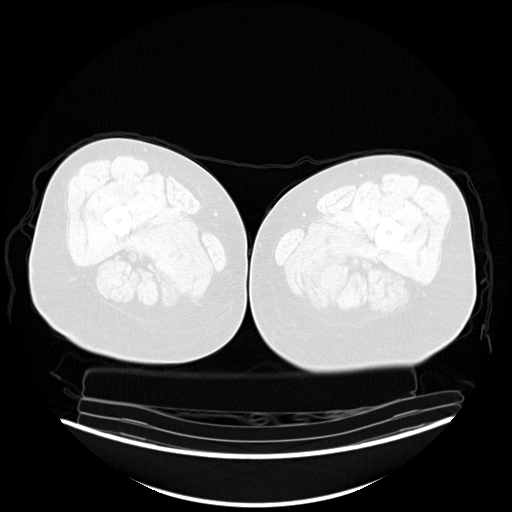
[im 145/290]
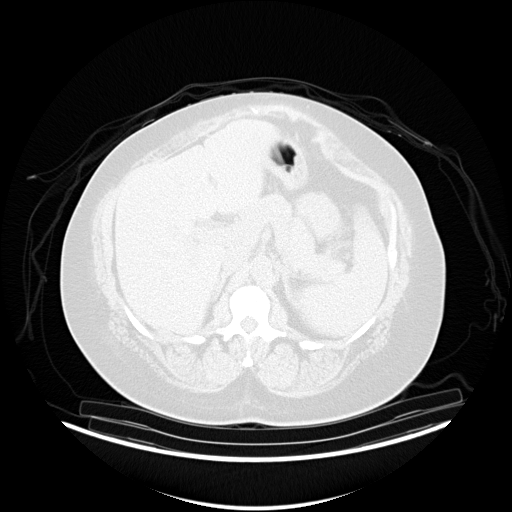
[im 290/290  brain]
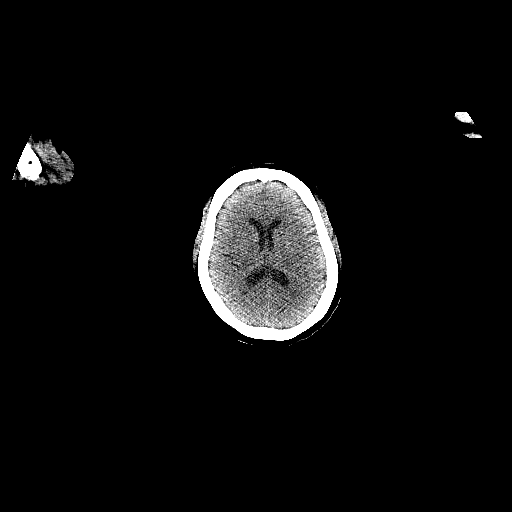

[Series 5: pet wb uncorrected (nac) · axial · 5.0mm · 4.07mm/px · z∈[-216,+651]mm · 3 of 290 slices shown]
[im 1/290]
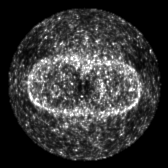
[im 145/290]
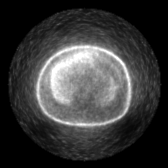
[im 290/290]
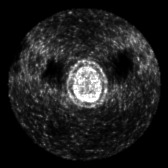

[Series 6: pet wb (ac) · axial · 5.0mm · 3.39mm/px · z∈[-216,+651]mm · 4 of 290 slices shown]
[im 1/290]
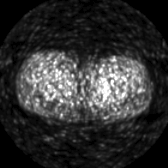
[im 97/290]
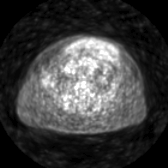
[im 193/290]
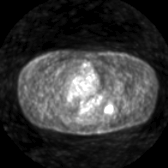
[im 290/290]
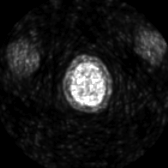

[Series 603: fused axial · 3 of 288 slices shown]
[im 1/288]
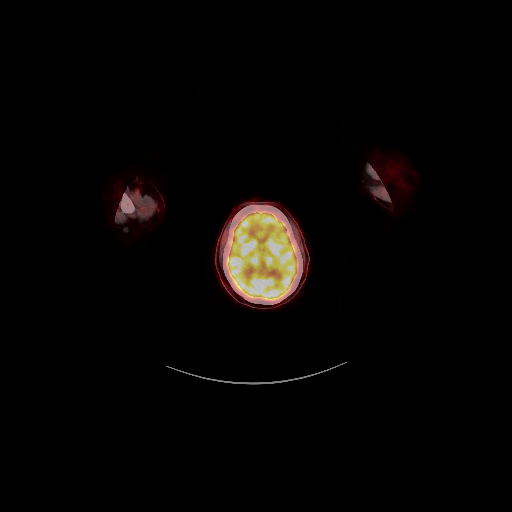
[im 96/288]
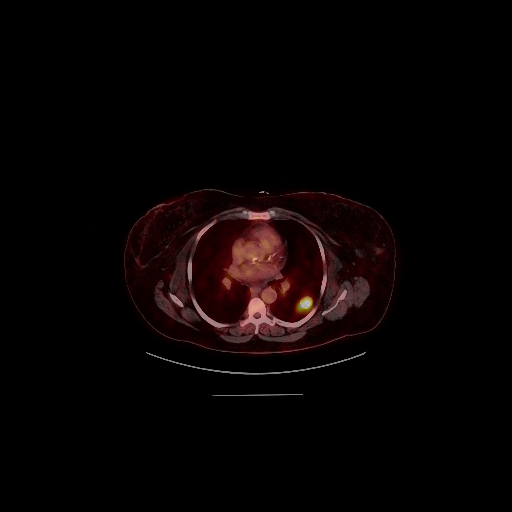
[im 288/288]
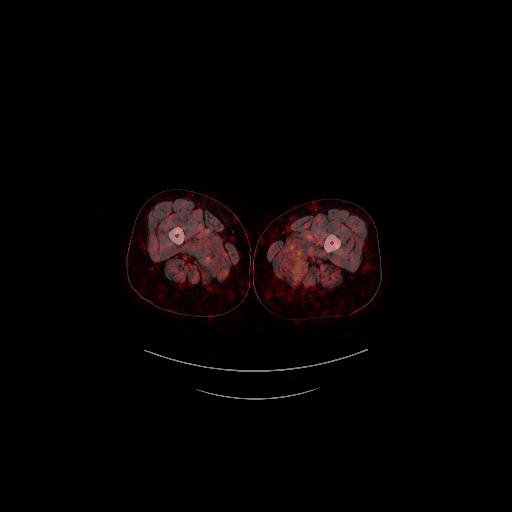

[Series 604: fused coronal · 1 of 95 slices shown]
[im 1/95]
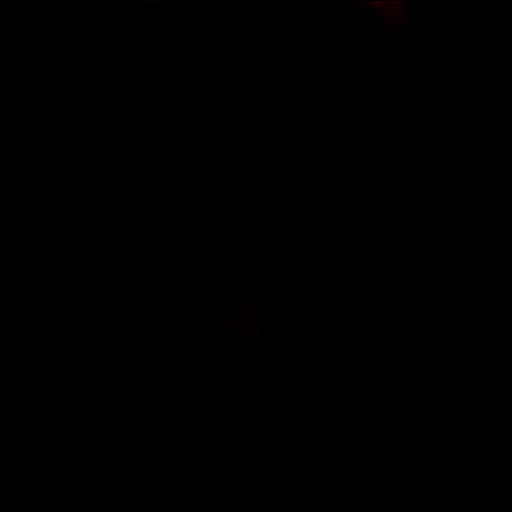

[Series 605: fused sagittal · 2 of 164 slices shown]
[im 1/164]
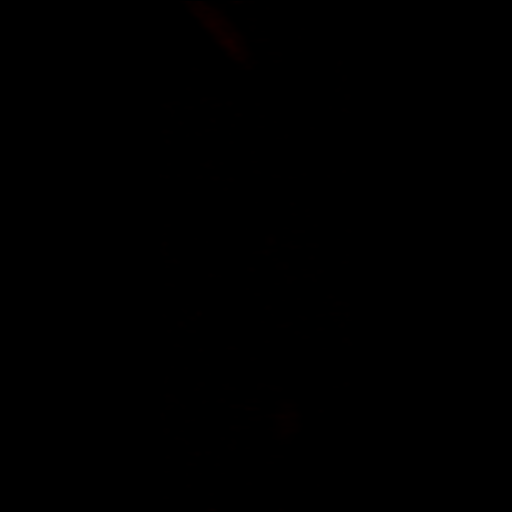
[im 164/164]
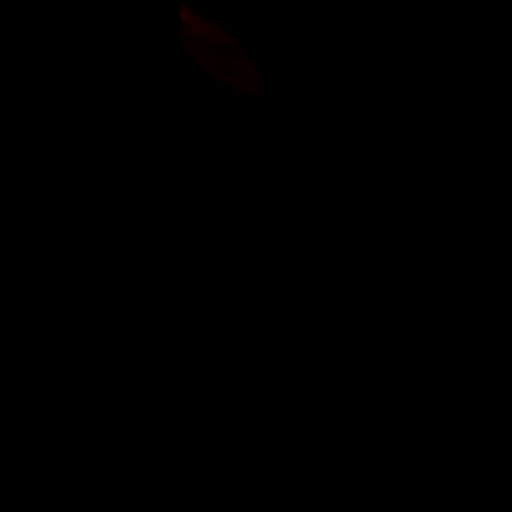

[Series 606: pet axial · 4 of 290 slices shown]
[im 1/290]
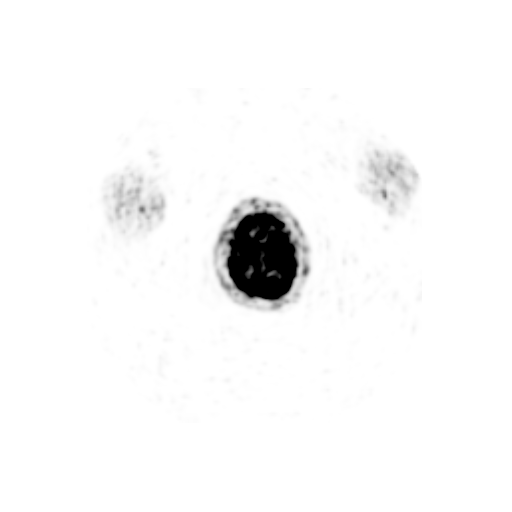
[im 97/290]
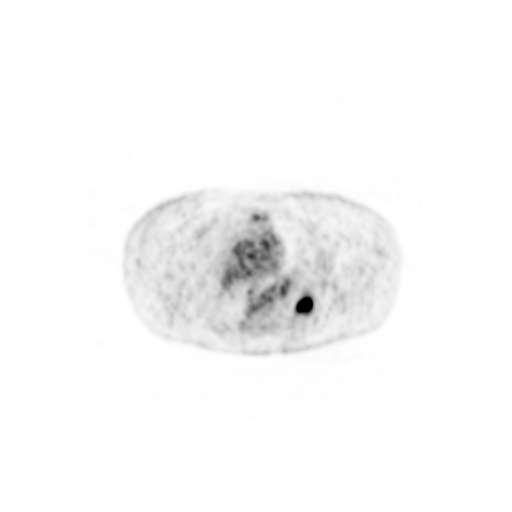
[im 193/290]
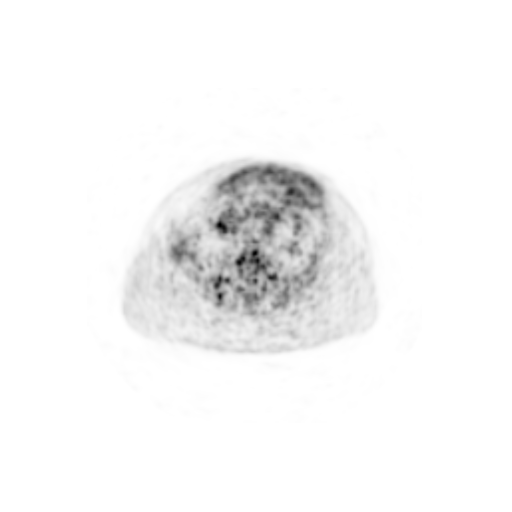
[im 290/290]
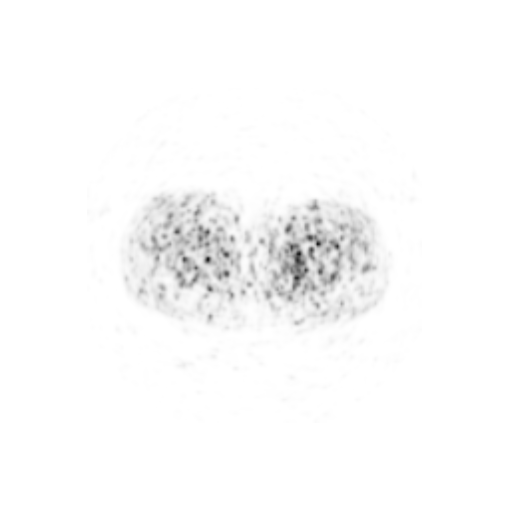

[Series 607: pet coronal · 1 of 120 slices shown]
[im 1/120]
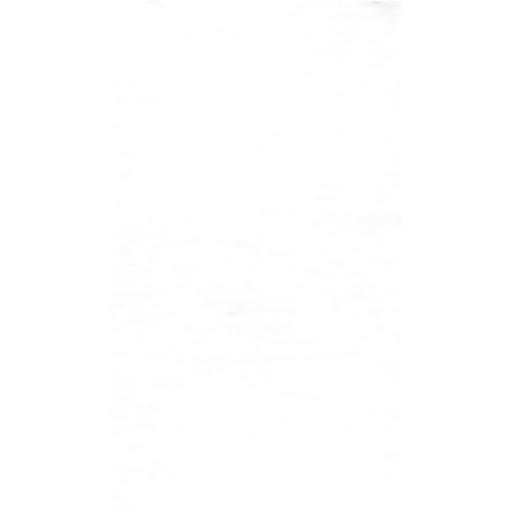

[Series 608: pet sagital · 2 of 185 slices shown]
[im 1/185]
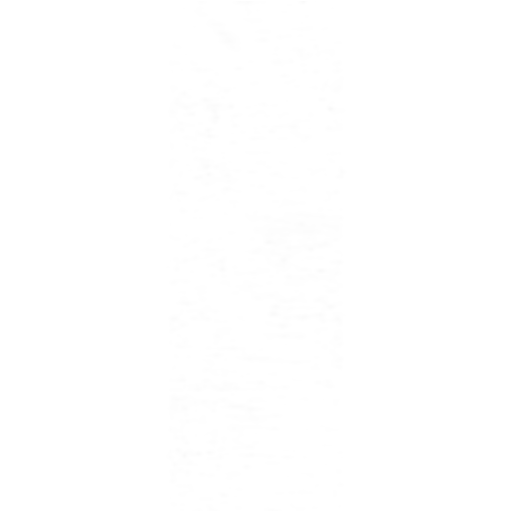
[im 185/185]
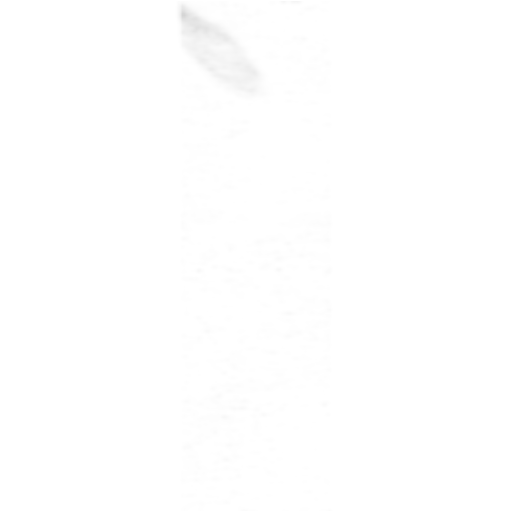

[Series 8047: results mm oncology reading · 3.0mm · 0.94mm/px · 1 of 5 slices shown]
[im 1/5]
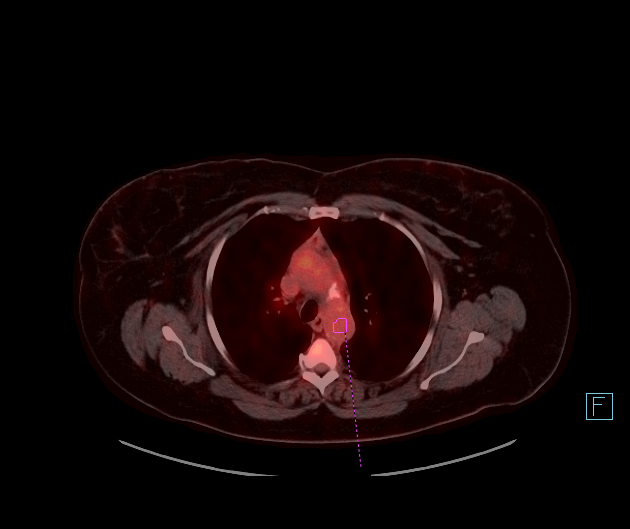

[24 of 25 positions shown; findings below may reference images not displayed]

FINDINGS: Mediastinal blood pool activity: SUV max

Liver activity: SUV max NA

NECK: No areas of abnormal hypermetabolism.

Incidental CT findings: No cervical adenopathy.

CHEST: No thoracic nodal hypermetabolism. The left lower lobe
lobulated lung mass is hypermetabolic at 3.1 x 1.9 cm and a S.U.V.
max of 9.9 on 99/3.

Smaller nodules are primarily below PET resolution. A 1.0 cm left
lower lobe nodule on 114/3 is not hypermetabolic, at the low end of
PET resolution.

Incidental CT findings: Deferred to recent diagnostic CT. Coronary
and aortic atherosclerosis with tiny hiatal hernia.

ABDOMEN/PELVIS: No abdominopelvic parenchymal or nodal
hypermetabolism.

Incidental CT findings: Cholecystectomy. Normal adrenal glands.
Abdominal aortic atherosclerosis. Abdominal wall laxity containing
nonobstructive bowel loops. Globular enlarged uterus is likely
related to underlying fibroids.

A left pelvic, presumably ovarian 5.2 x 5.9 cm lesion demonstrates
fat and ossific densities, consistent with a dermoid.

SKELETON: Soft tissue density subcutaneous or intramuscular mass
just cephalad to the right iliac crest measures [2P] cm and a
S.U.V. max of 2.7 on 183/3.

Separate more posterior right iliac focus of hypermetabolism may
correspond to vague increased density. Example at a S.U.V. max of
6.6 on 196/3.

Incidental CT findings: Peripherally sclerotic left iliac 1.2 cm
lesion on 201/3 has indolent characteristics.
IMPRESSION: 1. Hypermetabolic left lower lobe lung mass, consistent with primary
bronchogenic carcinoma.
2. Right iliac hypermetabolism, highly suspicious for isolated
osseous metastasis.
3. Soft tissue mass just cephalad to the right iliac crest is
nonspecific but most likely represents an intramuscular metastasis.
4. Smaller nonspecific pulmonary nodules, primarily below PET
resolution.
5. Incidental findings, including: Coronary artery atherosclerosis.
Aortic Atherosclerosis ([2P]-[2P]). Fibroid uterus. Left ovarian
dermoid.

## 2020-05-06 MED ORDER — FLUDEOXYGLUCOSE F - 18 (FDG) INJECTION
11.6000 | Freq: Once | INTRAVENOUS | Status: AC | PRN
  Administered 2020-05-06: 10.88 via INTRAVENOUS

## 2020-05-07 ENCOUNTER — Other Ambulatory Visit: Payer: Medicaid Other

## 2020-05-07 ENCOUNTER — Inpatient Hospital Stay: Payer: Medicaid Other

## 2020-05-07 ENCOUNTER — Inpatient Hospital Stay: Payer: Medicaid Other | Admitting: Oncology

## 2020-05-07 NOTE — Progress Notes (Signed)
Tumor Board Documentation  Gracen Ringwald was presented by Norvel Richards, RN at our Tumor Board on 05/06/2020, which included representatives from medical oncology, radiation oncology, surgical oncology, internal medicine, navigation, pathology, radiology, surgical, pharmacy, genetics, research, palliative care, pulmonology.  Adaeze currently presents as a new patient, for discussion with history of the following treatments: active survellience.  Additionally, we reviewed previous medical and familial history, history of present illness, and recent lab results along with all available histopathologic and imaging studies. The tumor board considered available treatment options and made the following recommendations: Biopsy (Refer to Dr Duwayne Heck vs CT biopsy of Iliac Lesion if she is a surgical candidate)    The following procedures/referrals were also placed: No orders of the defined types were placed in this encounter.   Clinical Trial Status: not discussed   Staging used: To be determined  National site-specific guidelines   were discussed with respect to the case.  Tumor board is a meeting of clinicians from various specialty areas who evaluate and discuss patients for whom a multidisciplinary approach is being considered. Final determinations in the plan of care are those of the provider(s). The responsibility for follow up of recommendations given during tumor board is that of the provider.   Today's extended care, comprehensive team conference, Krystina was not present for the discussion and was not examined.   Multidisciplinary Tumor Board is a multidisciplinary case peer review process.  Decisions discussed in the Multidisciplinary Tumor Board reflect the opinions of the specialists present at the conference without having examined the patient.  Ultimately, treatment and diagnostic decisions rest with the primary provider(s) and the patient.

## 2020-05-08 DIAGNOSIS — R918 Other nonspecific abnormal finding of lung field: Secondary | ICD-10-CM | POA: Insufficient documentation

## 2020-05-08 DIAGNOSIS — C3432 Malignant neoplasm of lower lobe, left bronchus or lung: Secondary | ICD-10-CM | POA: Insufficient documentation

## 2020-05-08 NOTE — Progress Notes (Signed)
[facility]  Telephone:([phone]) [phone] Fax:(336) [phone]  ID: Natalie [name] OB: [birth year]  MR#: [id number]  NGE#:[id number]  Patient Care Team: Center, [facility] as PCP - [facility] [place], [place], [place] as Oncology Nurse Navigator  CHIEF COMPLAINT: Left lower lobe lung mass  INTERVAL HISTORY: Patient is a 59 year old female who was noted to have a suspicious lung mass on CT screening.  Subsequent PET scan confirmed a left lower lobe mass suspicious for malignancy as well as right pelvic and intramuscular lesions concerning for metastasis.  She currently feels well and is asymptomatic.  She has no neurologic complaints.  She denies any recent fevers or illnesses.  She has good appetite and denies weight loss.  She has no chest pain, shortness of breath, cough, or hemoptysis.  She denies any nausea, vomiting, constipation, or diarrhea.  She has no urinary complaints.  Patient feels at her baseline offers no specific complaints today.  REVIEW OF SYSTEMS:   Review of Systems  Constitutional: Negative.  Negative for fever, malaise/fatigue and weight loss.  Respiratory: Negative.  Negative for cough, hemoptysis and shortness of breath.   Cardiovascular: Negative.  Negative for chest pain and leg swelling.  Gastrointestinal: Negative.  Negative for abdominal pain.  Genitourinary: Negative.  Negative for dysuria.  Musculoskeletal: Negative.  Negative for back pain and joint pain.  Skin: Negative.  Negative for rash.  Neurological: Negative.  Negative for dizziness, focal weakness, weakness and headaches.  Psychiatric/Behavioral: Negative.  The patient is not nervous/anxious.     As per HPI. Otherwise, a complete review of systems is negative.  PAST MEDICAL HISTORY: Past Medical History:  Diagnosis Date  . COPD (chronic obstructive pulmonary disease) ([place])   . Depression   . Diabetes mellitus without complication ([place])     PAST SURGICAL  HISTORY: History reviewed. No pertinent surgical history.  FAMILY HISTORY: History reviewed. No pertinent family history.  ADVANCED DIRECTIVES (Y/N):  N  HEALTH MAINTENANCE: Social History   Tobacco Use  . Smoking status: Former Research scientist (life sciences)  . Smokeless tobacco: Former Network engineer  . Vaping Use: Never used  Substance Use Topics  . Alcohol use: Yes  . Drug use: Not Currently     Colonoscopy:  PAP:  Bone density:  Lipid panel:  No Known Allergies  Current Outpatient Medications  Medication Sig Dispense Refill  . albuterol (VENTOLIN HFA) 108 (90 Base) MCG/ACT inhaler INHALE 2 PUFFS BY MOUTH EVERY 4 TO 6 HOURS AS NEEDED FOR WHEEZING OR COUGH OR SHORTNESS OF BREATH    . atorvastatin (LIPITOR) 40 MG tablet Take 40 mg by mouth at bedtime.    . benztropine (COGENTIN) 1 MG tablet Take 1 tablet by mouth daily.    . citalopram (CELEXA) 20 MG tablet Take 20 mg by mouth every morning.    . insulin aspart (NOVOLOG) 100 UNIT/ML FlexPen Inject 9 Units into the skin 3 (three) times daily with meals. 15 mL 11  . insulin glargine (LANTUS) 100 unit/mL SOPN Inject 0.15 mLs (15 Units total) into the skin 2 (two) times daily. 15 mL 11  . latanoprost (XALATAN) 0.005 % ophthalmic solution Place 1 drop into both eyes at bedtime.    . lidocaine (LIDODERM) 5 % 1 patch daily.    Marland Kitchen LORazepam (ATIVAN) 1 MG tablet Take 1 mg by mouth 2 (two) times daily.    . TRELEGY ELLIPTA 100-62.5-25 MCG/INH AEPB Inhale 1 puff into the lungs daily.    . ziprasidone (GEODON) 40 MG capsule Take  1 capsule by mouth at bedtime.    . Multiple Vitamin (DAILY-VITE MULTIVITAMIN) TABS Take 1 tablet by mouth daily. (Patient not taking: Reported on 05/11/2020)    . potassium chloride (KLOR-CON) 20 MEQ packet Take 20 mEq by mouth daily for 14 days. 14 packet 0   No current facility-administered medications for this visit.    OBJECTIVE: Vitals:   05/11/20 1307  BP: 124/70  Pulse: (!) 105  Resp: 20  Temp: 97.8 F (36.6 C)   SpO2: 98%     Body mass index is 37.13 kg/m.    ECOG FS:0 - Asymptomatic  General: Well-developed, well-nourished, no acute distress. Eyes: Pink conjunctiva, anicteric sclera. HEENT: Normocephalic, moist mucous membranes. Lungs: No audible wheezing or coughing. Heart: Regular rate and rhythm. Abdomen: Soft, nontender, no obvious distention. Musculoskeletal: No edema, cyanosis, or clubbing. Neuro: Alert, answering all questions appropriately. Cranial nerves grossly intact. Skin: No rashes or petechiae noted. Psych: Normal affect. Lymphatics: No cervical, calvicular, axillary or inguinal LAD.   LAB RESULTS:  Lab Results  Component Value Date   NA 143 01/30/2020   K 2.9 (L) 01/30/2020   CL 102 01/30/2020   CO2 28 01/30/2020   GLUCOSE 255 (H) 01/30/2020   BUN 8 01/30/2020   CREATININE 0.89 01/30/2020   CALCIUM 8.9 01/30/2020   PROT 7.6 01/20/2020   ALBUMIN 2.6 (L) 01/26/2020   AST 26 01/20/2020   ALT 23 01/20/2020   ALKPHOS 89 01/20/2020   BILITOT 2.5 (H) 01/20/2020   GFRNONAA >60 01/30/2020   GFRAA >60 01/30/2020    Lab Results  Component Value Date   WBC 5.2 01/26/2020   HGB 8.9 (L) 01/26/2020   HCT 27.4 (L) 01/26/2020   MCV 97.5 01/26/2020   PLT 187 01/26/2020     STUDIES: CT CHEST WO CONTRAST  Result Date: 05/03/2020 CLINICAL DATA:  Abnormal chest radiograph EXAM: CT CHEST WITHOUT CONTRAST TECHNIQUE: Multidetector CT imaging of the chest was performed following the standard protocol without IV contrast. COMPARISON:  Chest radiograph 01/26/2020 FINDINGS: Cardiovascular: Abdominal aorta is normal caliber with atherosclerotic calcification. There is no retroperitoneal or periportal lymphadenopathy. No pelvic lymphadenopathy. Mediastinum/Nodes: No axillary or supraclavicular adenopathy. No mediastinal or hilar adenopathy. No pericardial fluid. Esophagus normal. Lungs/Pleura: In the superior segment of the LEFT lower lobe lobular mass measures 3.1 by 2.1 cm (image  71/CT series 3). Within the LEFT lower lobe 11 mm nodule (image 101/series 3). Additional 4 mm pulmonary nodule in the LEFT lower lobe on image 84). Angular density in the superior aspect of the LEFT upper lobe measures 14 mm. There is small centrilobular ground-glass nodules within the upper lobes and lower lobes. The several larger ground-glass nodules are present within the lower lobes. For example 9 mm ground-glass nodule in the RIGHT lower lobe image 87/series 3. 6 mm ground-glass nodule on image 64 in the RIGHT lower lobe. Similar rounded ground-glass nodules in the LEFT lower lobe Upper Abdomen: Limited view of the liver, kidneys, pancreas are unremarkable. Normal adrenal glands. Musculoskeletal: No aggressive osseous lesion. IMPRESSION: 1. Lobular mass in the LEFT lower lobe is most concerning for bronchogenic carcinoma. Recommend FDG PET scan for staging and biopsy planning. 2. Two pulmonary nodules in the LEFT lower lobe are concerning for synchronous bronchogenic carcinoma versus metastasis. 3. No mediastinal lymphadenopathy identified on noncontrast exam. 4. Bilateral scattered ground-glass nodules which are small in the upper lobes and larger and more defined in the lower lobes. Differential includes inflammatory / infectious process versus multifocal neoplastic  process. Electronically Signed   By: Suzy Bouchard M.D.   On: 05/03/2020 09:18   NM PET Image Initial (PI) Skull Base To Thigh  Result Date: 05/06/2020 CLINICAL DATA:  Initial treatment strategy for left lower lobe lung mass on chest CT. EXAM: NUCLEAR MEDICINE PET SKULL BASE TO THIGH TECHNIQUE: 10.9 mCi F-18 FDG was injected intravenously. Full-ring PET imaging was performed from the skull base to thigh after the radiotracer. CT data was obtained and used for attenuation correction and anatomic localization. Fasting blood glucose: 138 mg/dl COMPARISON:  05/03/2020 chest CT FINDINGS: Mediastinal blood pool activity: SUV max 2.5 Liver  activity: SUV max NA NECK: No areas of abnormal hypermetabolism. Incidental CT findings: No cervical adenopathy. CHEST: No thoracic nodal hypermetabolism. The left lower lobe lobulated lung mass is hypermetabolic at 3.1 x 1.9 cm and a S.U.V. max of 9.9 on 99/3. Smaller nodules are primarily below PET resolution. A 1.0 cm left lower lobe nodule on 114/3 is not hypermetabolic, at the low end of PET resolution. Incidental CT findings: Deferred to recent diagnostic CT. Coronary and aortic atherosclerosis with tiny hiatal hernia. ABDOMEN/PELVIS: No abdominopelvic parenchymal or nodal hypermetabolism. Incidental CT findings: Cholecystectomy. Normal adrenal glands. Abdominal aortic atherosclerosis. Abdominal wall laxity containing nonobstructive bowel loops. Globular enlarged uterus is likely related to underlying fibroids. A left pelvic, presumably ovarian 5.2 x 5.9 cm lesion demonstrates fat and ossific densities, consistent with a dermoid. SKELETON: Soft tissue density subcutaneous or intramuscular mass just cephalad to the right iliac crest measures 4.9x4.1 cm and a S.U.V. max of 2.7 on 183/3. Separate more posterior right iliac focus of hypermetabolism may correspond to vague increased density. Example at a S.U.V. max of 6.6 on 196/3. Incidental CT findings: Peripherally sclerotic left iliac 1.2 cm lesion on 201/3 has indolent characteristics. IMPRESSION: 1. Hypermetabolic left lower lobe lung mass, consistent with primary bronchogenic carcinoma. 2. Right iliac hypermetabolism, highly suspicious for isolated osseous metastasis. 3. Soft tissue mass just cephalad to the right iliac crest is nonspecific but most likely represents an intramuscular metastasis. 4. Smaller nonspecific pulmonary nodules, primarily below PET resolution. 5. Incidental findings, including: Coronary artery atherosclerosis. Aortic Atherosclerosis (ICD10-I70.0). Fibroid uterus. Left ovarian dermoid. Electronically Signed   By: Abigail Miyamoto M.D.    On: 05/06/2020 12:23    ASSESSMENT: Left lower lobe lung mass  PLAN:    1.  Left lower lobe lung mass: PET scan results from May 06, 2020 reviewed independently and reported as above with hypermetabolic left lower lobe lung mass highly suspicious for underlying malignancy.  Patient also noted to have a right iliac bony hypermetabolism as well as a soft tissue mass adjacent suspicious for isolated metastasis.  Patient was discussed at tumor board and recommendation was to do a CT-guided biopsy of intramuscular soft tissue mass.  If this is negative for malignancy, can consider biopsy of left lower lobe lung mass.  Patient will return to clinic 1 week after biopsy to discuss the results and additional diagnostic planning.  I spent a total of 60 minutes reviewing chart data, face-to-face evaluation with the patient, counseling and coordination of care as detailed above.   Patient expressed understanding and was in agreement with this plan. She also understands that She can call clinic at any time with any questions, concerns, or complaints.   Cancer Staging No matching staging information was found for the patient.  Lloyd Huger, MD   05/11/2020 1:52 PM

## 2020-05-10 ENCOUNTER — Inpatient Hospital Stay: Payer: Medicaid Other | Admitting: Oncology

## 2020-05-10 ENCOUNTER — Inpatient Hospital Stay: Payer: Medicaid Other

## 2020-05-11 ENCOUNTER — Encounter: Payer: Self-pay | Admitting: *Deleted

## 2020-05-11 ENCOUNTER — Other Ambulatory Visit: Payer: Self-pay

## 2020-05-11 ENCOUNTER — Inpatient Hospital Stay: Payer: Medicaid Other

## 2020-05-11 ENCOUNTER — Inpatient Hospital Stay (HOSPITAL_BASED_OUTPATIENT_CLINIC_OR_DEPARTMENT_OTHER): Payer: Medicaid Other | Admitting: Oncology

## 2020-05-11 ENCOUNTER — Ambulatory Visit: Payer: Medicaid Other | Admitting: Oncology

## 2020-05-11 ENCOUNTER — Encounter: Payer: Self-pay | Admitting: Oncology

## 2020-05-11 DIAGNOSIS — R918 Other nonspecific abnormal finding of lung field: Secondary | ICD-10-CM

## 2020-05-11 DIAGNOSIS — N949 Unspecified condition associated with female genital organs and menstrual cycle: Secondary | ICD-10-CM | POA: Diagnosis not present

## 2020-05-11 DIAGNOSIS — Z87891 Personal history of nicotine dependence: Secondary | ICD-10-CM | POA: Diagnosis not present

## 2020-05-11 DIAGNOSIS — M629 Disorder of muscle, unspecified: Secondary | ICD-10-CM

## 2020-05-11 NOTE — Progress Notes (Signed)
  Oncology Nurse Navigator Documentation  Navigator Location: [facility]-Med Onc (05/11/20 1400)   )Navigator Encounter Type: Initial MedOnc (05/11/20 1400)                     Patient Visit Type: MedOnc (05/11/20 1400)   Barriers/Navigation Needs: Coordination of Care;Transportation (05/11/20 1400)   Interventions: Coordination of Care;Transportation (05/11/20 1400)   Coordination of Care: Appts;Radiology (05/11/20 1400)        Acuity: Level 2-Minimal Needs (1-2 Barriers Identified) (05/11/20 1400)    met with patient during initial consult with Dr. Grayland Ormond to review PET scan results and discuss next steps. All questions answered during visit. Reviewed upcoming appts for biopsy on 11/29 and follow up with Dr. Grayland Ormond on 12/7. Pt stated that she will arrange transportation for both appts and does not need [place] transportation services at this time. Waiver signed in case pt may need services in the future. Contact info given and instructed pt to call with any further questions or needs. Pt verbalized understanding. Nothing further needed at this time.     Time Spent with Patient: 60 (05/11/20 1400)

## 2020-05-11 NOTE — Progress Notes (Signed)
Patient on schedule for Intramuscular biopsy 05/17/2020. Called and spoke with son who patient lives with. He stated he started new job and can't bring mother on 11/29, so placed patient on schedule for 05/19/2020. Made aware to be here @ 1000, NPO after MN as well and driver for discharge post procedure/recovery. Also will hold am dose of diabetic meds and 1/2 dose of INsulin pm before procedure. Stated understanding.

## 2020-05-17 ENCOUNTER — Ambulatory Visit: Admission: RE | Admit: 2020-05-17 | Payer: Medicaid Other | Source: Ambulatory Visit

## 2020-05-17 ENCOUNTER — Other Ambulatory Visit: Payer: Self-pay | Admitting: Radiology

## 2020-05-18 ENCOUNTER — Other Ambulatory Visit: Payer: Self-pay | Admitting: Radiology

## 2020-05-19 ENCOUNTER — Ambulatory Visit
Admission: RE | Admit: 2020-05-19 | Discharge: 2020-05-19 | Disposition: A | Discharge status: Home / Self Care | Payer: Medicaid Other | Source: Ambulatory Visit | Attending: Interventional Radiology | Admitting: Interventional Radiology

## 2020-05-19 ENCOUNTER — Other Ambulatory Visit: Payer: Self-pay | Admitting: Interventional Radiology

## 2020-05-19 ENCOUNTER — Ambulatory Visit
Admission: RE | Admit: 2020-05-19 | Discharge: 2020-05-19 | Disposition: A | Discharge status: Home / Self Care | Payer: Medicaid Other | Source: Ambulatory Visit | Attending: Oncology | Admitting: Oncology

## 2020-05-19 ENCOUNTER — Other Ambulatory Visit: Payer: Self-pay

## 2020-05-19 DIAGNOSIS — E119 Type 2 diabetes mellitus without complications: Secondary | ICD-10-CM | POA: Diagnosis not present

## 2020-05-19 DIAGNOSIS — Z794 Long term (current) use of insulin: Secondary | ICD-10-CM | POA: Diagnosis not present

## 2020-05-19 DIAGNOSIS — M7989 Other specified soft tissue disorders: Secondary | ICD-10-CM | POA: Insufficient documentation

## 2020-05-19 DIAGNOSIS — J449 Chronic obstructive pulmonary disease, unspecified: Secondary | ICD-10-CM | POA: Insufficient documentation

## 2020-05-19 DIAGNOSIS — Z79899 Other long term (current) drug therapy: Secondary | ICD-10-CM | POA: Insufficient documentation

## 2020-05-19 DIAGNOSIS — R918 Other nonspecific abnormal finding of lung field: Secondary | ICD-10-CM | POA: Diagnosis present

## 2020-05-19 DIAGNOSIS — F32A Depression, unspecified: Secondary | ICD-10-CM | POA: Insufficient documentation

## 2020-05-19 DIAGNOSIS — Z87891 Personal history of nicotine dependence: Secondary | ICD-10-CM | POA: Insufficient documentation

## 2020-05-19 LAB — CBC
HCT: 38.1 % (ref 36.0–46.0)
Hemoglobin: 12.7 g/dL (ref 12.0–15.0)
MCH: 30.2 pg (ref 26.0–34.0)
MCHC: 33.3 g/dL (ref 30.0–36.0)
MCV: 90.5 fL (ref 80.0–100.0)
Platelets: 208 10*3/uL (ref 150–400)
RBC: 4.21 MIL/uL (ref 3.87–5.11)
RDW: 15.8 % — ABNORMAL HIGH (ref 11.5–15.5)
WBC: 11.8 10*3/uL — ABNORMAL HIGH (ref 4.0–10.5)
nRBC: 0 % (ref 0.0–0.2)

## 2020-05-19 LAB — PROTIME-INR
INR: 1 (ref 0.8–1.2)
Prothrombin Time: 13.2 seconds (ref 11.4–15.2)

## 2020-05-19 LAB — GLUCOSE, CAPILLARY: Glucose-Capillary: 109 mg/dL — ABNORMAL HIGH (ref 70–99)

## 2020-05-19 IMAGING — US US INTRAOPERATIVE - NO REPORT
1 series · 4 of 4 positions shown · non-contrast
Comparison: PET-CT - [DATE]

INDICATION: Concern for metastatic lung cancer. Please perform image guided
biopsy of hypermetabolic soft tissue mass adjacent to the right
ilium for tissue diagnostic purposes.

EXAM:
ULTRASOUND AND CT-GUIDED BIOPSY OF INDETERMINATE HYPERMETABOLIC MASS
ADJACENT TO THE RIGHT ILIUM.

[Series 1: us intraoperative · 4 of 4 slices shown]
[im 1/4]
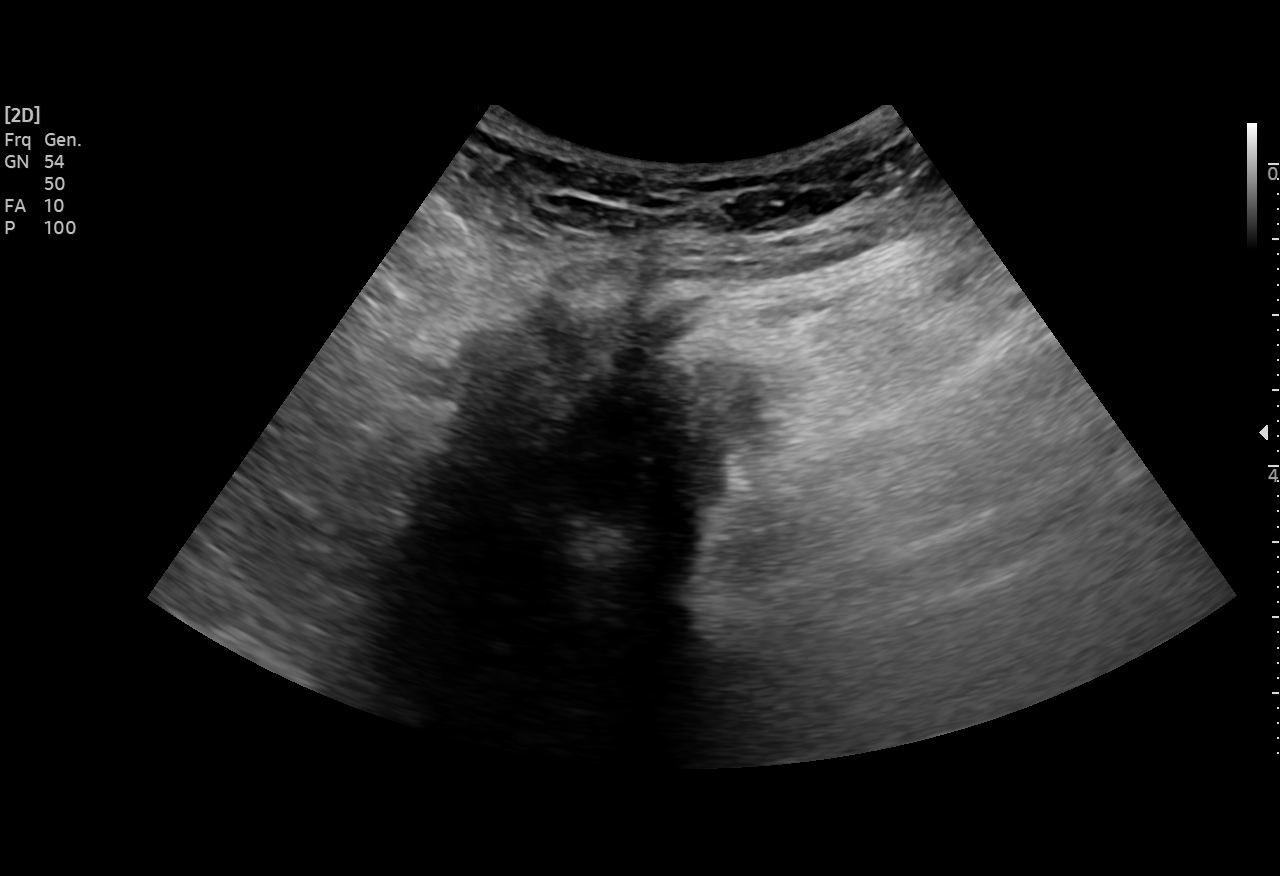
[im 2/4]
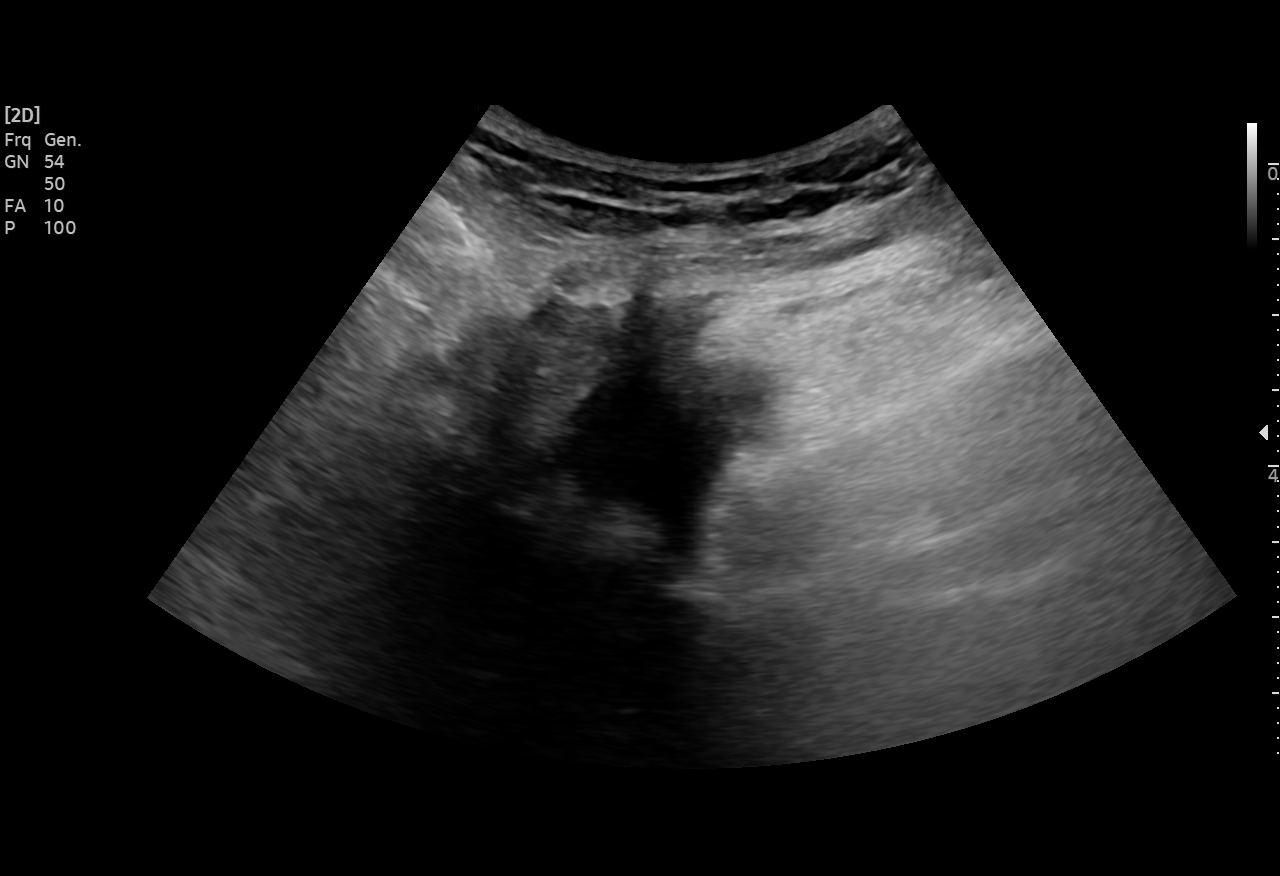
[im 3/4]
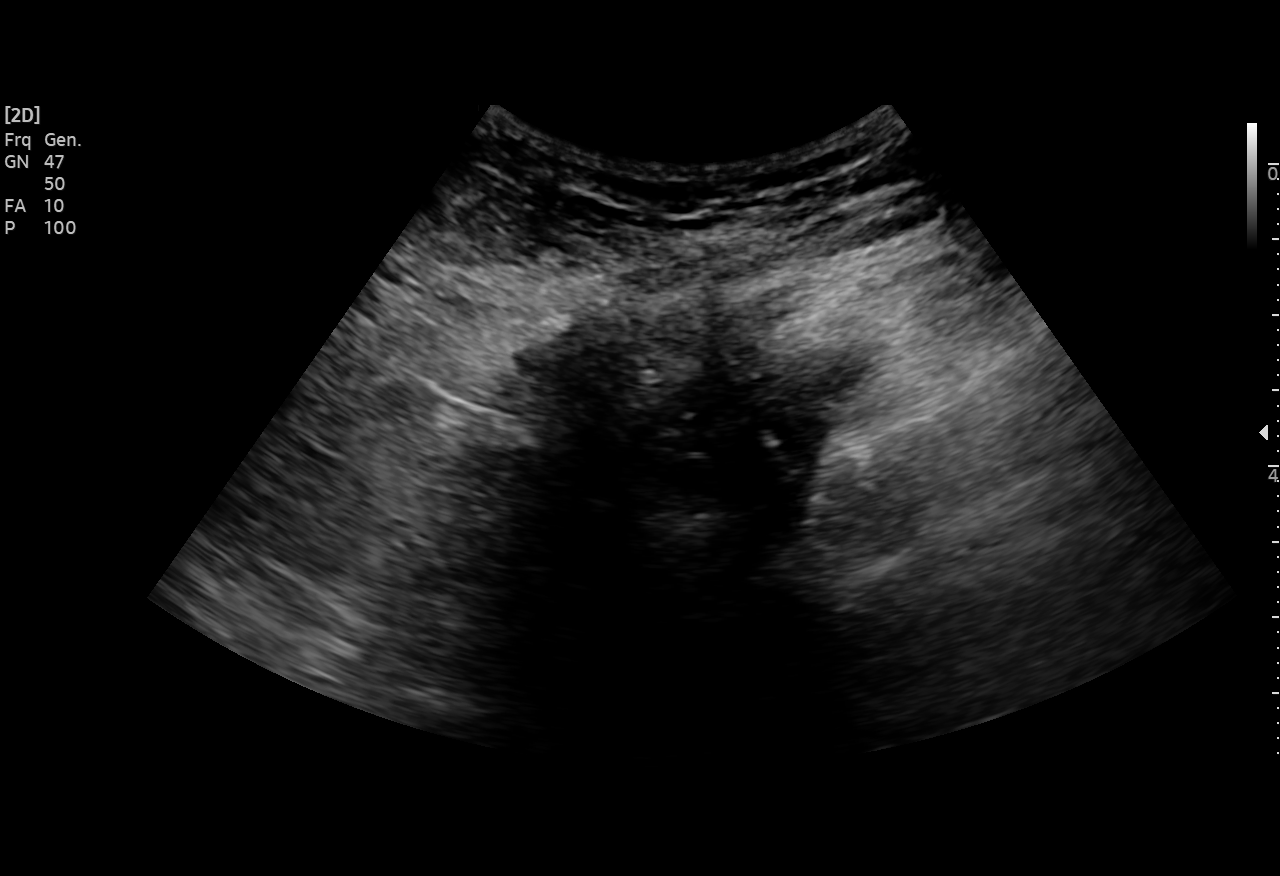
[im 4/4]
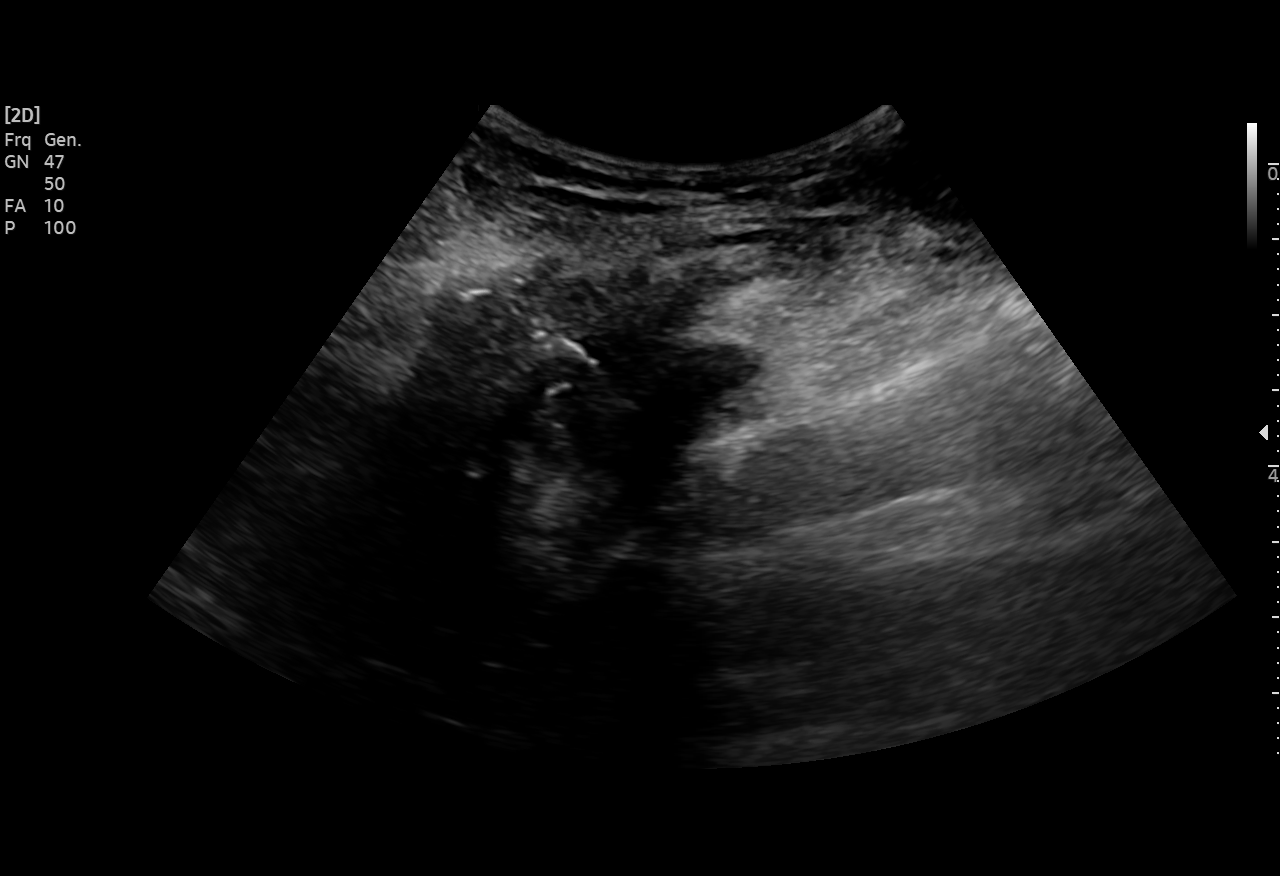

[4 of 4 positions shown; findings below may reference images not displayed]

MEDICATIONS:
None.

ANESTHESIA/SEDATION:
Fentanyl 100 mcg IV; Versed 2 mg IV

Sedation time: 19 minutes; The patient was continuously monitored
during the procedure by the interventional radiology nurse under my
direct supervision.

CONTRAST:  None.

COMPLICATIONS:
None immediate.

PROCEDURE:
Informed consent was obtained from the patient following an
explanation of the procedure, risks, benefits and alternatives. A
time out was performed prior to the initiation of the procedure.

The patient was positioned supine on the CT table and a limited CT
was performed for procedural planning demonstrating unchanged size
and appearance of the spiculated at least 5.1 x 4.3 cm mass within
the soft tissues adjacent to superior anterior aspect of the right
ilium (image 23, series 3). The table position was marked and the
lesion was identified sonographically. The procedure was planned.
The operative site was prepped and draped in the usual sterile
fashion.

Under direct ultrasound guidance, a 17 gauge coaxial needle was
advanced into the peripheral aspect of the mass spiculated soft
tissue mass after lying soft tissues were anesthetized 1% lidocaine
with epinephrine. An ultrasound image was saved procedural
documentation purposes.

Appropriate position was confirmed with CT imaging.

Next, 8 core needle biopsies were obtained of the mass within 18
gauge core needle biopsy device under direct ultrasound guidance.

Note, multiple samples were attempted to be obtained utilizing 3
different biopsy devices given overall scant tissue acquisition due
to the fibrous nature of the mass.

The co-axial needle was removed and superficial hemostasis was
achieved with manual compression. A dressing was placed. The patient
tolerated the procedure well without immediate postprocedural
complication.
IMPRESSION: Technically successful ultrasound and CT guided core needle biopsy
of hypermetabolic spiculated soft tissue mass adjacent to the
anterior superior aspect of the right ilium.

Note, multiple samples were attempted to be obtained utilizing 3
different biopsy devices given overall scant tissue acquisition due
to the fibrous nature of the mass.

## 2020-05-19 IMAGING — CT CT BIOPSY
1 of 3 series · 12 of 32 positions shown, 18 images · non-contrast
Comparison: PET-CT - [DATE]

INDICATION: Concern for metastatic lung cancer. Please perform image guided
biopsy of hypermetabolic soft tissue mass adjacent to the right
ilium for tissue diagnostic purposes.

EXAM:
ULTRASOUND AND CT-GUIDED BIOPSY OF INDETERMINATE HYPERMETABOLIC MASS
ADJACENT TO THE RIGHT ILIUM.

[Series 2: i-spiral 5.0 b30f · axial · 0.90mm/px · z∈[-210,-45]mm · 12 of 55 slices shown, 18 images]
[im 4/55  soft-tissue]
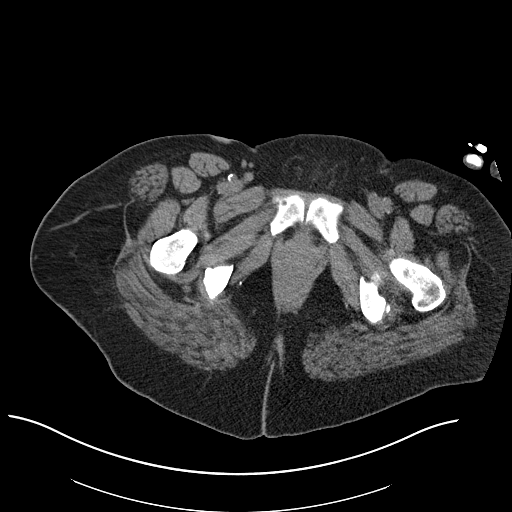
[im 4/55  bone]
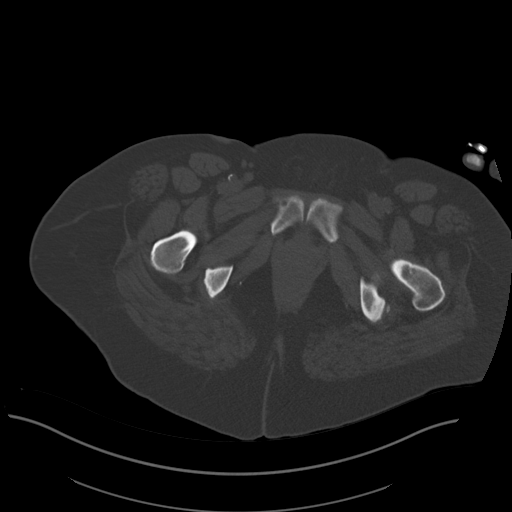
[im 8/55  soft-tissue]
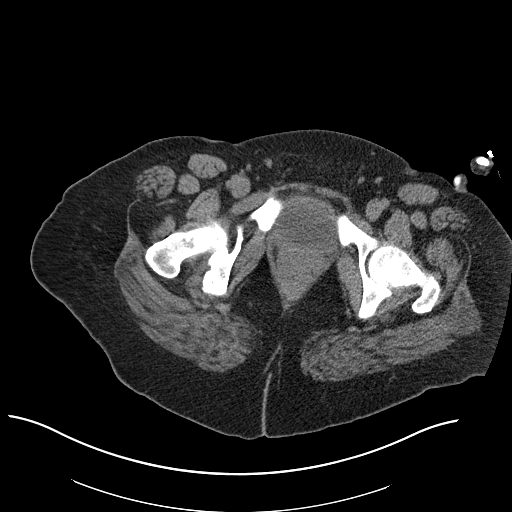
[im 12/55  soft-tissue]
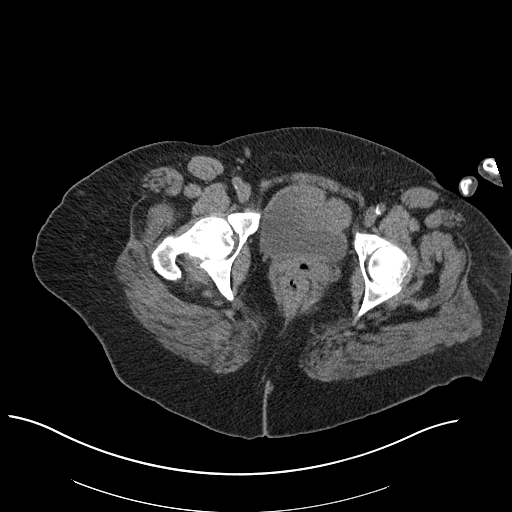
[im 16/55  soft-tissue]
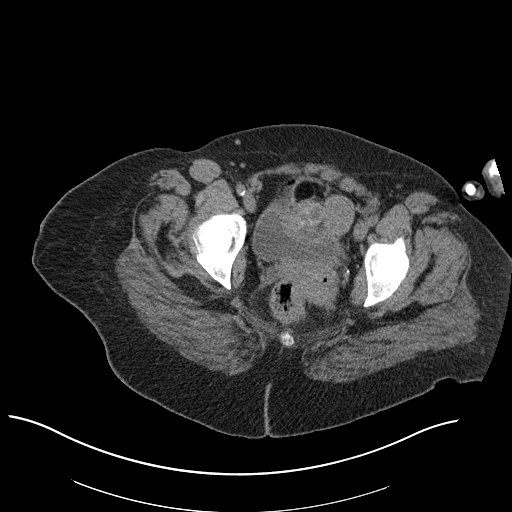
[im 20/55  soft-tissue]
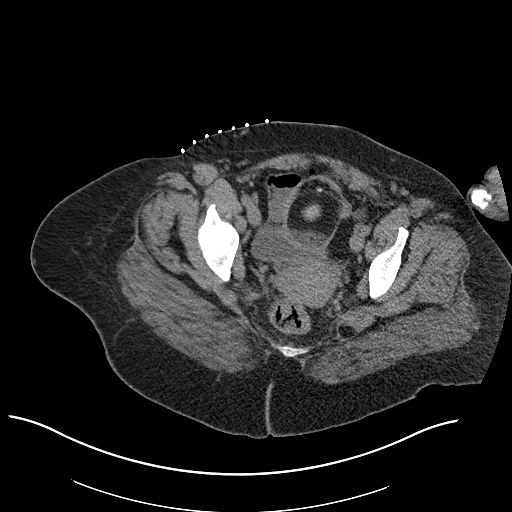
[im 24/55  soft-tissue]
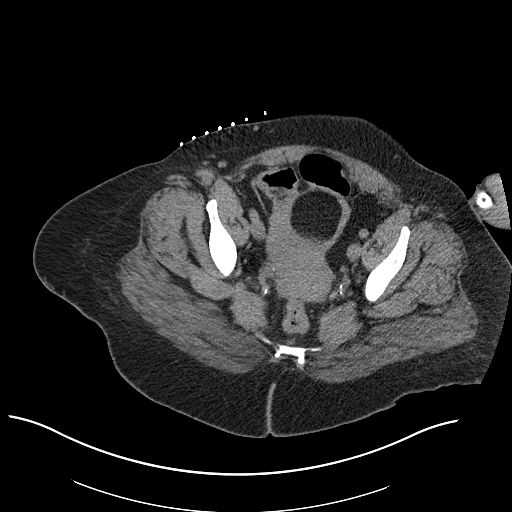
[im 31/55  soft-tissue]
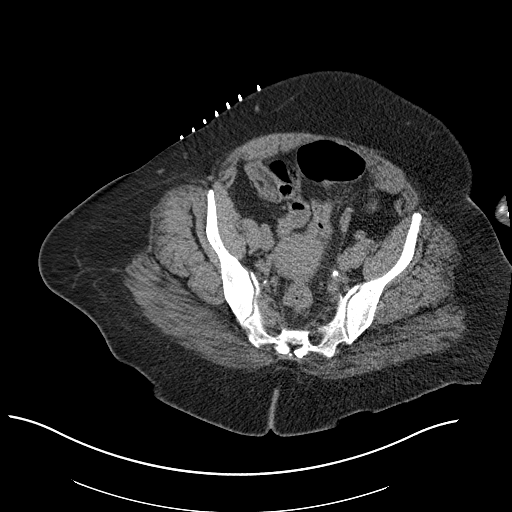
[im 35/55  soft-tissue]
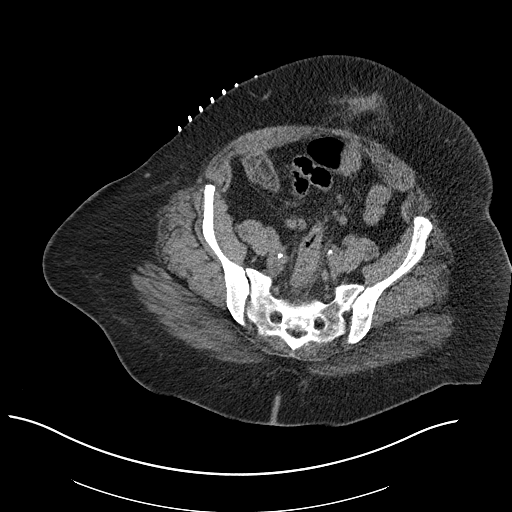
[im 39/55  soft-tissue]
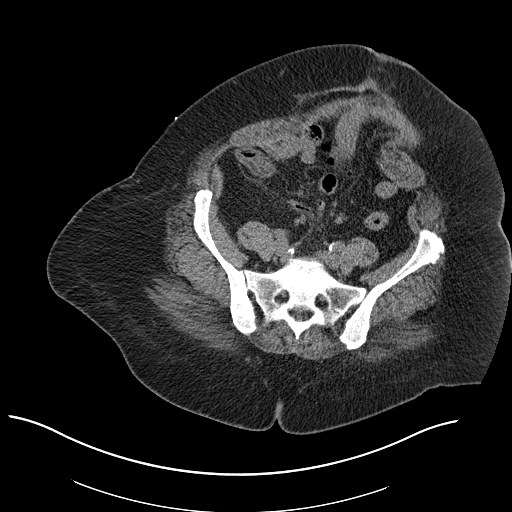
[im 39/55  lung]
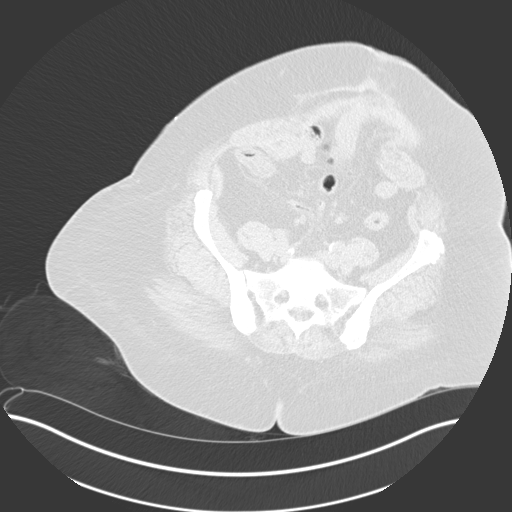
[im 39/55  bone]
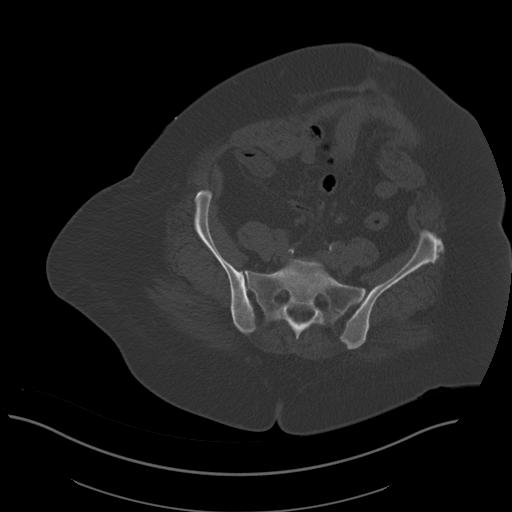
[im 43/55  soft-tissue]
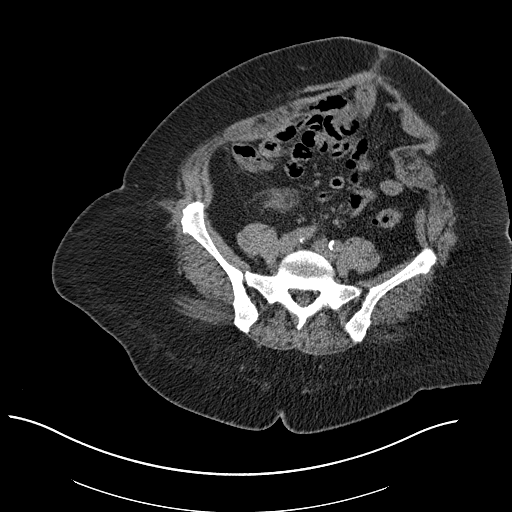
[im 43/55  lung]
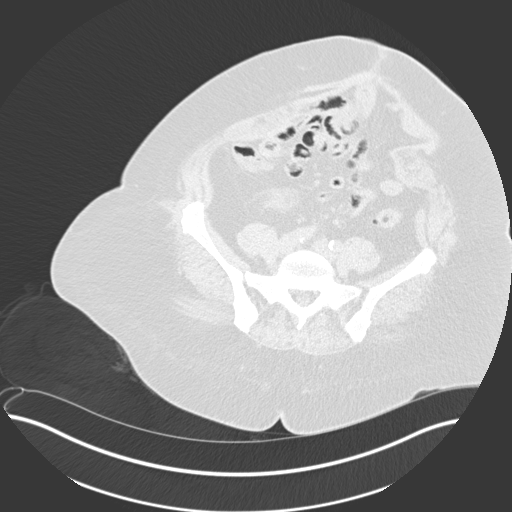
[im 47/55  soft-tissue]
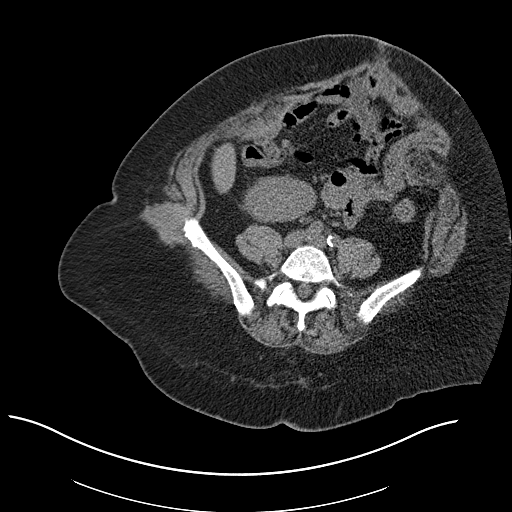
[im 47/55  lung]
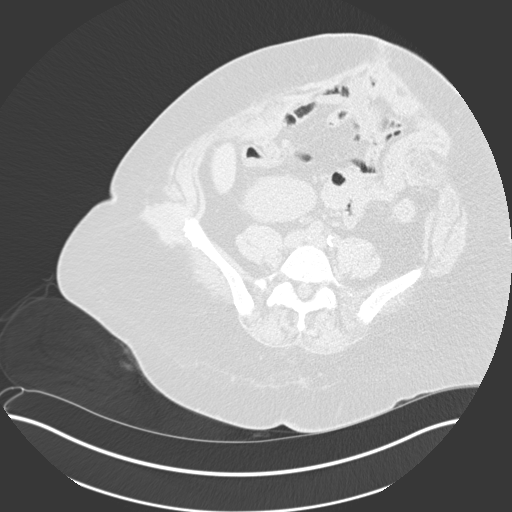
[im 51/55  soft-tissue]
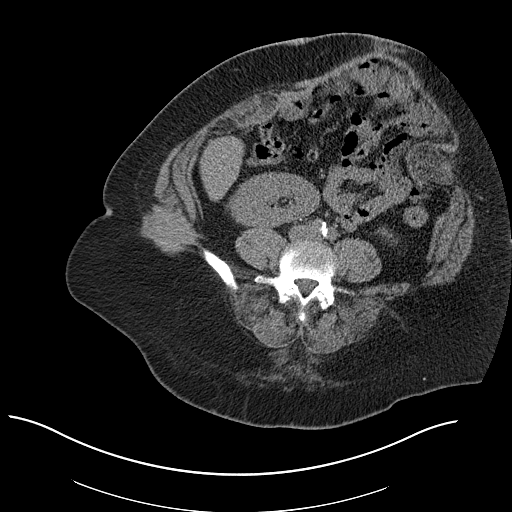
[im 51/55  lung]
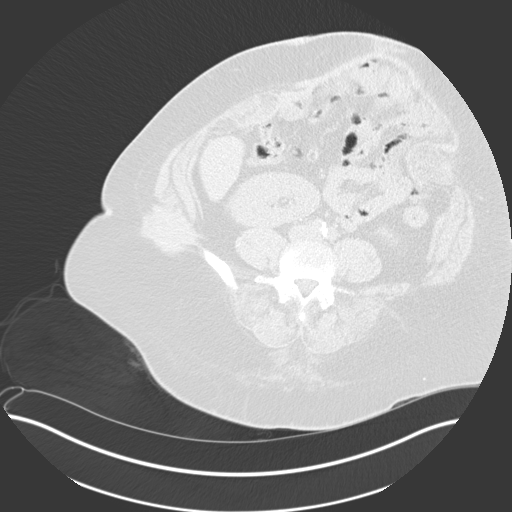

[12 of 32 positions shown; findings below may reference images not displayed]

MEDICATIONS:
None.

ANESTHESIA/SEDATION:
Fentanyl 100 mcg IV; Versed 2 mg IV

Sedation time: 19 minutes; The patient was continuously monitored
during the procedure by the interventional radiology nurse under my
direct supervision.

CONTRAST:  None.

COMPLICATIONS:
None immediate.

PROCEDURE:
Informed consent was obtained from the patient following an
explanation of the procedure, risks, benefits and alternatives. A
time out was performed prior to the initiation of the procedure.

The patient was positioned supine on the CT table and a limited CT
was performed for procedural planning demonstrating unchanged size
and appearance of the spiculated at least 5.1 x 4.3 cm mass within
the soft tissues adjacent to superior anterior aspect of the right
ilium (image 23, series 3). The table position was marked and the
lesion was identified sonographically. The procedure was planned.
The operative site was prepped and draped in the usual sterile
fashion.

Under direct ultrasound guidance, a 17 gauge coaxial needle was
advanced into the peripheral aspect of the mass spiculated soft
tissue mass after lying soft tissues were anesthetized 1% lidocaine
with epinephrine. An ultrasound image was saved procedural
documentation purposes.

Appropriate position was confirmed with CT imaging.

Next, 8 core needle biopsies were obtained of the mass within 18
gauge core needle biopsy device under direct ultrasound guidance.

Note, multiple samples were attempted to be obtained utilizing 3
different biopsy devices given overall scant tissue acquisition due
to the fibrous nature of the mass.

The co-axial needle was removed and superficial hemostasis was
achieved with manual compression. A dressing was placed. The patient
tolerated the procedure well without immediate postprocedural
complication.
IMPRESSION: Technically successful ultrasound and CT guided core needle biopsy
of hypermetabolic spiculated soft tissue mass adjacent to the
anterior superior aspect of the right ilium.

Note, multiple samples were attempted to be obtained utilizing 3
different biopsy devices given overall scant tissue acquisition due
to the fibrous nature of the mass.

## 2020-05-19 MED ORDER — FENTANYL CITRATE (PF) 100 MCG/2ML IJ SOLN
INTRAMUSCULAR | Status: AC | PRN
Start: 2020-05-19 — End: 2020-05-19
  Administered 2020-05-19 (×2): 50 ug via INTRAVENOUS

## 2020-05-19 MED ORDER — FENTANYL CITRATE (PF) 100 MCG/2ML IJ SOLN
INTRAMUSCULAR | Status: AC
  Filled 2020-05-19: qty 2

## 2020-05-19 MED ORDER — HEPARIN SOD (PORK) LOCK FLUSH 100 UNIT/ML IV SOLN
INTRAVENOUS | Status: AC
  Filled 2020-05-19: qty 5

## 2020-05-19 MED ORDER — SODIUM CHLORIDE 0.9 % IV SOLN: INTRAVENOUS | Status: DC

## 2020-05-19 MED ORDER — MIDAZOLAM HCL 5 MG/5ML IJ SOLN
INTRAMUSCULAR | Status: AC | PRN
  Administered 2020-05-19 (×2): 1 mg via INTRAVENOUS

## 2020-05-19 MED ORDER — MIDAZOLAM HCL 5 MG/5ML IJ SOLN
INTRAMUSCULAR | Status: AC
  Filled 2020-05-19: qty 5

## 2020-05-19 NOTE — Consult Note (Signed)
Chief Complaint: Concern for metastatic lung cancer  Referring Physician(s): Finnegan,Timothy J  Patient Status: [facility] - Out-pt  History of Present Illness: Natalie [name] is a 59 y.o. female with past medical history significant for COPD, diabetes and depression who was found to have worrisome lesion within the left lower lobe on chest CT performed 05/03/2020 found to be hypermetabolic on PET/CT performed 05/06/2020 also found to have a hypermetabolic soft tissue nodule adjacent to the anterior aspect of the right ilium.  Patient presents today for image guided biopsy of the soft tissue mass adjacent to the anterior aspect of the right ilium for tissue diagnostic purposes.  Patient remains without complaint.  Specifically, no unintentional weight loss.  No change in appetite or energy level.  No chest pain, shortness of breath, fever or chills.    Past Medical History:  Diagnosis Date  . COPD (chronic obstructive pulmonary disease) ([place])   . Depression   . Diabetes mellitus without complication ([place])     History reviewed. No pertinent surgical history.  Allergies: Patient has no known allergies.  Medications: Prior to Admission medications   Medication Sig Start Date End Date Taking? Authorizing Provider  albuterol (VENTOLIN HFA) 108 (90 Base) MCG/ACT inhaler INHALE 2 PUFFS BY MOUTH EVERY 4 TO 6 HOURS AS NEEDED FOR WHEEZING OR COUGH OR SHORTNESS OF BREATH 04/08/20  Yes [provider]  atorvastatin (LIPITOR) 40 MG tablet Take 40 mg by mouth at bedtime. 01/15/20  Yes [provider]  benztropine (COGENTIN) 1 MG tablet Take 1 tablet by mouth daily. 01/15/20  Yes [provider]  citalopram (CELEXA) 20 MG tablet Take 20 mg by mouth every morning. 01/15/20  Yes [provider]  insulin aspart (NOVOLOG) 100 UNIT/ML FlexPen Inject 9 Units into the skin 3 (three) times daily with meals. 01/30/20  Yes Barb Merino, MD  insulin glargine  (LANTUS) 100 unit/mL SOPN Inject 0.15 mLs (15 Units total) into the skin 2 (two) times daily. 01/30/20  Yes Ghimire, Dante Gang, MD  latanoprost (XALATAN) 0.005 % ophthalmic solution Place 1 drop into both eyes at bedtime. 01/15/20  Yes [provider]  LORazepam (ATIVAN) 1 MG tablet Take 1 mg by mouth 2 (two) times daily. 12/17/19  Yes [provider]  ziprasidone (GEODON) 40 MG capsule Take 1 capsule by mouth at bedtime. 01/19/20  Yes [provider]  lidocaine (LIDODERM) 5 % 1 patch daily. 04/13/20   [provider]  Multiple Vitamin (DAILY-VITE MULTIVITAMIN) TABS Take 1 tablet by mouth daily. Patient not taking: Reported on 05/11/2020 01/15/20   [provider]  potassium chloride (KLOR-CON) 20 MEQ packet Take 20 mEq by mouth daily for 14 days. 01/30/20 02/13/20  Barb Merino, MD  TRELEGY ELLIPTA 100-62.5-25 MCG/INH AEPB Inhale 1 puff into the lungs daily. Patient not taking: Reported on 05/19/2020 01/15/20   [provider]     History reviewed. No pertinent family history.  Social History   Socioeconomic History  . Marital status: Single    Spouse name: Not on file  . Number of children: Not on file  . Years of education: Not on file  . Highest education level: Not on file  Occupational History  . Not on file  Tobacco Use  . Smoking status: Former Research scientist (life sciences)  . Smokeless tobacco: Former Network engineer  . Vaping Use: Never used  Substance and Sexual Activity  . Alcohol use: Yes  . Drug use: Not Currently  . Sexual activity: Not on file  Other Topics Concern  . Not on file  Social History Narrative  . Not on file   Social Determinants of Health   Financial Resource Strain:   . Difficulty of Paying Living Expenses: Not on file  Food Insecurity:   . Worried About Charity fundraiser in the Last Year: Not on file  . Ran Out of Food in the Last Year: Not on file  Transportation Needs:   . Lack of Transportation (Medical): Not on  file  . Lack of Transportation (Non-Medical): Not on file  Physical Activity:   . Days of Exercise per Week: Not on file  . Minutes of Exercise per Session: Not on file  Stress:   . Feeling of Stress : Not on file  Social Connections:   . Frequency of Communication with Friends and Family: Not on file  . Frequency of Social Gatherings with Friends and Family: Not on file  . Attends Religious Services: Not on file  . Active Member of Clubs or Organizations: Not on file  . Attends Archivist Meetings: Not on file  . Marital Status: Not on file    ECOG Status: 0 - Asymptomatic  Review of Systems: A 12 point ROS discussed and pertinent positives are indicated in the HPI above.  All other systems are negative.  Review of Systems  Vital Signs: BP (!) 168/80   Pulse 95   Temp 98.9 F (37.2 C) (Oral)   Resp 17   Ht 5\' 2"  (1.575 m)   Wt 101.2 kg   SpO2 97%   BMI 40.79 kg/m   Physical Exam  Imaging: CT CHEST WO CONTRAST  Result Date: 05/03/2020 CLINICAL DATA:  Abnormal chest radiograph EXAM: CT CHEST WITHOUT CONTRAST TECHNIQUE: Multidetector CT imaging of the chest was performed following the standard protocol without IV contrast. COMPARISON:  Chest radiograph 01/26/2020 FINDINGS: Cardiovascular: Abdominal aorta is normal caliber with atherosclerotic calcification. There is no retroperitoneal or periportal lymphadenopathy. No pelvic lymphadenopathy. Mediastinum/Nodes: No axillary or supraclavicular adenopathy. No mediastinal or hilar adenopathy. No pericardial fluid. Esophagus normal. Lungs/Pleura: In the superior segment of the LEFT lower lobe lobular mass measures 3.1 by 2.1 cm (image 71/CT series 3). Within the LEFT lower lobe 11 mm nodule (image 101/series 3). Additional 4 mm pulmonary nodule in the LEFT lower lobe on image 84). Angular density in the superior aspect of the LEFT upper lobe measures 14 mm. There is small centrilobular ground-glass nodules within the upper  lobes and lower lobes. The several larger ground-glass nodules are present within the lower lobes. For example 9 mm ground-glass nodule in the RIGHT lower lobe image 87/series 3. 6 mm ground-glass nodule on image 64 in the RIGHT lower lobe. Similar rounded ground-glass nodules in the LEFT lower lobe Upper Abdomen: Limited view of the liver, kidneys, pancreas are unremarkable. Normal adrenal glands. Musculoskeletal: No aggressive osseous lesion. IMPRESSION: 1. Lobular mass in the LEFT lower lobe is most concerning for bronchogenic carcinoma. Recommend FDG PET scan for staging and biopsy planning. 2. Two pulmonary nodules in the LEFT lower lobe are concerning for synchronous bronchogenic carcinoma versus metastasis. 3. No mediastinal lymphadenopathy identified on noncontrast exam. 4. Bilateral scattered ground-glass nodules which are small in the upper lobes and larger and more defined in the lower lobes. Differential includes inflammatory / infectious process versus multifocal neoplastic process. Electronically Signed   By: Suzy Bouchard M.D.   On: 05/03/2020 09:18   NM PET Image Initial (PI) Skull Base To Thigh  Result Date: 05/06/2020 CLINICAL DATA:  Initial treatment strategy for left lower lobe lung mass on chest CT. EXAM: NUCLEAR MEDICINE PET SKULL BASE TO THIGH TECHNIQUE: 10.9 mCi F-18 FDG was injected intravenously. Full-ring PET imaging was performed from the skull base to thigh after the radiotracer. CT data was obtained and used for attenuation correction and anatomic localization. Fasting blood glucose: 138 mg/dl COMPARISON:  05/03/2020 chest CT FINDINGS: Mediastinal blood pool activity: SUV max 2.5 Liver activity: SUV max NA NECK: No areas of abnormal hypermetabolism. Incidental CT findings: No cervical adenopathy. CHEST: No thoracic nodal hypermetabolism. The left lower lobe lobulated lung mass is hypermetabolic at 3.1 x 1.9 cm and a S.U.V. max of 9.9 on 99/3. Smaller nodules are primarily below  PET resolution. A 1.0 cm left lower lobe nodule on 114/3 is not hypermetabolic, at the low end of PET resolution. Incidental CT findings: Deferred to recent diagnostic CT. Coronary and aortic atherosclerosis with tiny hiatal hernia. ABDOMEN/PELVIS: No abdominopelvic parenchymal or nodal hypermetabolism. Incidental CT findings: Cholecystectomy. Normal adrenal glands. Abdominal aortic atherosclerosis. Abdominal wall laxity containing nonobstructive bowel loops. Globular enlarged uterus is likely related to underlying fibroids. A left pelvic, presumably ovarian 5.2 x 5.9 cm lesion demonstrates fat and ossific densities, consistent with a dermoid. SKELETON: Soft tissue density subcutaneous or intramuscular mass just cephalad to the right iliac crest measures 4.9x4.1 cm and a S.U.V. max of 2.7 on 183/3. Separate more posterior right iliac focus of hypermetabolism may correspond to vague increased density. Example at a S.U.V. max of 6.6 on 196/3. Incidental CT findings: Peripherally sclerotic left iliac 1.2 cm lesion on 201/3 has indolent characteristics. IMPRESSION: 1. Hypermetabolic left lower lobe lung mass, consistent with primary bronchogenic carcinoma. 2. Right iliac hypermetabolism, highly suspicious for isolated osseous metastasis. 3. Soft tissue mass just cephalad to the right iliac crest is nonspecific but most likely represents an intramuscular metastasis. 4. Smaller nonspecific pulmonary nodules, primarily below PET resolution. 5. Incidental findings, including: Coronary artery atherosclerosis. Aortic Atherosclerosis (ICD10-I70.0). Fibroid uterus. Left ovarian dermoid. Electronically Signed   By: Abigail Miyamoto M.D.   On: 05/06/2020 12:23    Labs:  CBC: Recent Labs    01/21/20 0325 01/22/20 0415 01/23/20 0323 01/26/20 0531  WBC 13.6* 5.1 4.5 5.2  HGB 13.4 9.8* 9.8* 8.9*  HCT 40.6 26.5* 27.0* 27.4*  PLT 169 89* 68* 187    COAGS: No results for input(s): INR, APTT in the last 8760  hours.  BMP: Recent Labs    01/27/20 0440 01/28/20 0518 01/29/20 0427 01/30/20 0636  NA 144 148* 146* 143  K 3.0* 3.6 3.6 2.9*  CL 104 110 109 102  CO2 28 29 29 28   GLUCOSE 219* 172* 205* 255*  BUN 20 11 7 8   CALCIUM 8.6* 8.5* 8.5* 8.9  CREATININE 1.27* 0.95 0.85 0.89  GFRNONAA 46* >60 >60 >60  GFRAA 53* >60 >60 >60    LIVER FUNCTION TESTS: Recent Labs    01/20/20 2216 01/26/20 0531  BILITOT 2.5*  --   AST 26  --   ALT 23  --   ALKPHOS 89  --   PROT 7.6  --   ALBUMIN 4.2 2.6*    TUMOR MARKERS: No results for input(s): AFPTM, CEA, CA199, CHROMGRNA in the last 8760 hours.  Assessment and Plan:  Natalie [name] is a 59 y.o. female with past medical history significant for COPD, diabetes and depression who was found to have worrisome lesion within the left lower lobe on chest CT  performed 05/03/2020 found to be hypermetabolic on PET/CT performed 05/06/2020 also found to have a hypermetabolic soft tissue nodule adjacent to the anterior aspect of the right ilium.  Patient presents today for image guided biopsy of the soft tissue mass adjacent to the anterior aspect of the right ilium for tissue diagnostic purposes.  Patient remains without complaint.    Risks and benefits of image guided biopsy of indeterminate soft tissue mass adjacent to the right ilium was discussed with the patient and/or patient's family including, but not limited to bleeding, infection, damage to adjacent structures or low yield requiring additional tests.  All of the questions were answered and there is agreement to proceed.  Consent signed and in chart.  Thank you for this interesting consult.  I greatly enjoyed meeting Trinty Marken and look forward to participating in their care.  A copy of this report was sent to the requesting provider on this date.  Electronically Signed: Sandi Mariscal, MD 05/19/2020, 10:45 AM   I spent a total of 15 Minutes in face to face in clinical  consultation, greater than 50% of which was counseling/coordinating care for US guided soft tissue mass biopsy.

## 2020-05-19 NOTE — Procedures (Signed)
Pre procedural Dx: Concern for metastatic lung cancer  Post procedural Dx: Same  Technically successful CT guided biopsy of hypermetabolic soft tissue nodule adjacent to the right ilium.    EBL: None.   Complications: None immediate.   Ronny Bacon, MD Pager #: [phone]

## 2020-05-20 LAB — SURGICAL PATHOLOGY

## 2020-05-22 NOTE — Progress Notes (Signed)
[place]  [phone] [phone]  ID: Natalie [name] OB: [birth year]  MR#: [id number]  MPN#:[id number]  Patient Care Team: Center, [facility] as PCP - [facility] [place], [place], [place] as Oncology Nurse Navigator  CHIEF COMPLAINT: Left lower lobe lung mass  INTERVAL HISTORY: Patient returns to clinic today for further evaluation and discussion of her biopsy results.  She continues to feel well and remains asymptomatic. She has no neurologic complaints.  She denies any recent fevers or illnesses.  She has a good appetite and denies weight loss.  She has no chest pain, shortness of breath, cough, or hemoptysis.  She denies any nausea, vomiting, constipation, or diarrhea.  She has no urinary complaints.  Patient offers no specific complaints today.  REVIEW OF SYSTEMS:   Review of Systems  Constitutional: Negative.  Negative for fever, malaise/fatigue and weight loss.  Respiratory: Negative.  Negative for cough, hemoptysis and shortness of breath.   Cardiovascular: Negative.  Negative for chest pain and leg swelling.  Gastrointestinal: Negative.  Negative for abdominal pain.  Genitourinary: Negative.  Negative for dysuria.  Musculoskeletal: Negative.  Negative for back pain and joint pain.  Skin: Negative.  Negative for rash.  Neurological: Negative.  Negative for dizziness, focal weakness, weakness and headaches.  Psychiatric/Behavioral: Negative.  The patient is not nervous/anxious.     As per HPI. Otherwise, a complete review of systems is negative.  PAST MEDICAL HISTORY: Past Medical History:  Diagnosis Date  . COPD (chronic obstructive pulmonary disease) ([place])   . Depression   . Diabetes mellitus without complication ([place])     PAST SURGICAL HISTORY: History reviewed. No pertinent surgical history.  FAMILY HISTORY: History reviewed. No pertinent family history.  ADVANCED DIRECTIVES (Y/N):  N  HEALTH  MAINTENANCE: Social History   Tobacco Use  . Smoking status: Former Research scientist (life sciences)  . Smokeless tobacco: Former Network engineer  . Vaping Use: Never used  Substance Use Topics  . Alcohol use: Yes  . Drug use: Not Currently     Colonoscopy:  PAP:  Bone density:  Lipid panel:  No Known Allergies  Current Outpatient Medications  Medication Sig Dispense Refill  . albuterol (VENTOLIN HFA) 108 (90 Base) MCG/ACT inhaler INHALE 2 PUFFS BY MOUTH EVERY 4 TO 6 HOURS AS NEEDED FOR WHEEZING OR COUGH OR SHORTNESS OF BREATH    . atorvastatin (LIPITOR) 40 MG tablet Take 40 mg by mouth at bedtime.    . benztropine (COGENTIN) 1 MG tablet Take 1 tablet by mouth daily.    . citalopram (CELEXA) 20 MG tablet Take 20 mg by mouth every morning.    . insulin aspart (NOVOLOG) 100 UNIT/ML FlexPen Inject 9 Units into the skin 3 (three) times daily with meals. 15 mL 11  . insulin glargine (LANTUS) 100 unit/mL SOPN Inject 0.15 mLs (15 Units total) into the skin 2 (two) times daily. 15 mL 11  . latanoprost (XALATAN) 0.005 % ophthalmic solution Place 1 drop into both eyes at bedtime.    . lidocaine (LIDODERM) 5 % 1 patch daily.    Marland Kitchen LORazepam (ATIVAN) 1 MG tablet Take 1 mg by mouth 2 (two) times daily.    . Multiple Vitamin (DAILY-VITE MULTIVITAMIN) TABS Take 1 tablet by mouth daily.     . TRELEGY ELLIPTA 100-62.5-25 MCG/INH AEPB Inhale 1 puff into the lungs daily.     . ziprasidone (GEODON) 40 MG capsule Take 1 capsule by mouth at bedtime.    . potassium chloride (KLOR-CON)  20 MEQ packet Take 20 mEq by mouth daily for 14 days. 14 packet 0   No current facility-administered medications for this visit.    OBJECTIVE: Vitals:   05/25/20 1000 05/25/20 1002  BP:  (!) 143/78  Pulse:  72  Resp: 20   Temp: 98 F (36.7 C)   SpO2:  100%     Body mass index is 41.76 kg/m.    ECOG FS:0 - Asymptomatic  General: Well-developed, well-nourished, no acute distress. Eyes: Pink conjunctiva, anicteric sclera. HEENT:  Normocephalic, moist mucous membranes. Lungs: No audible wheezing or coughing. Heart: Regular rate and rhythm. Abdomen: Soft, nontender, no obvious distention. Musculoskeletal: No edema, cyanosis, or clubbing. Neuro: Alert, answering all questions appropriately. Cranial nerves grossly intact. Skin: No rashes or petechiae noted. Psych: Normal affect.   LAB RESULTS:  Lab Results  Component Value Date   NA 143 01/30/2020   K 2.9 (L) 01/30/2020   CL 102 01/30/2020   CO2 28 01/30/2020   GLUCOSE 255 (H) 01/30/2020   BUN 8 01/30/2020   CREATININE 0.89 01/30/2020   CALCIUM 8.9 01/30/2020   PROT 7.6 01/20/2020   ALBUMIN 2.6 (L) 01/26/2020   AST 26 01/20/2020   ALT 23 01/20/2020   ALKPHOS 89 01/20/2020   BILITOT 2.5 (H) 01/20/2020   GFRNONAA >60 01/30/2020   GFRAA >60 01/30/2020    Lab Results  Component Value Date   WBC 11.8 (H) 05/19/2020   HGB 12.7 05/19/2020   HCT 38.1 05/19/2020   MCV 90.5 05/19/2020   PLT 208 05/19/2020     STUDIES: CT CHEST WO CONTRAST  Result Date: 05/03/2020 CLINICAL DATA:  Abnormal chest radiograph EXAM: CT CHEST WITHOUT CONTRAST TECHNIQUE: Multidetector CT imaging of the chest was performed following the standard protocol without IV contrast. COMPARISON:  Chest radiograph 01/26/2020 FINDINGS: Cardiovascular: Abdominal aorta is normal caliber with atherosclerotic calcification. There is no retroperitoneal or periportal lymphadenopathy. No pelvic lymphadenopathy. Mediastinum/Nodes: No axillary or supraclavicular adenopathy. No mediastinal or hilar adenopathy. No pericardial fluid. Esophagus normal. Lungs/Pleura: In the superior segment of the LEFT lower lobe lobular mass measures 3.1 by 2.1 cm (image 71/CT series 3). Within the LEFT lower lobe 11 mm nodule (image 101/series 3). Additional 4 mm pulmonary nodule in the LEFT lower lobe on image 84). Angular density in the superior aspect of the LEFT upper lobe measures 14 mm. There is small centrilobular  ground-glass nodules within the upper lobes and lower lobes. The several larger ground-glass nodules are present within the lower lobes. For example 9 mm ground-glass nodule in the RIGHT lower lobe image 87/series 3. 6 mm ground-glass nodule on image 64 in the RIGHT lower lobe. Similar rounded ground-glass nodules in the LEFT lower lobe Upper Abdomen: Limited view of the liver, kidneys, pancreas are unremarkable. Normal adrenal glands. Musculoskeletal: No aggressive osseous lesion. IMPRESSION: 1. Lobular mass in the LEFT lower lobe is most concerning for bronchogenic carcinoma. Recommend FDG PET scan for staging and biopsy planning. 2. Two pulmonary nodules in the LEFT lower lobe are concerning for synchronous bronchogenic carcinoma versus metastasis. 3. No mediastinal lymphadenopathy identified on noncontrast exam. 4. Bilateral scattered ground-glass nodules which are small in the upper lobes and larger and more defined in the lower lobes. Differential includes inflammatory / infectious process versus multifocal neoplastic process. Electronically Signed   By: Suzy Bouchard M.D.   On: 05/03/2020 09:18   [place] Intraoperative  Result Date: 05/19/2020 INDICATION: Concern for metastatic lung cancer. Please perform image guided biopsy of hypermetabolic  soft tissue mass adjacent to the right ilium for tissue diagnostic purposes. EXAM: ULTRASOUND AND CT-GUIDED BIOPSY OF INDETERMINATE HYPERMETABOLIC MASS ADJACENT TO THE RIGHT ILIUM. COMPARISON:  PET-CT - 05/06/2020 MEDICATIONS: None. ANESTHESIA/SEDATION: Fentanyl 100 mcg IV; Versed 2 mg IV Sedation time: 19 minutes; The patient was continuously monitored during the procedure by the interventional radiology nurse under my direct supervision. CONTRAST:  None. COMPLICATIONS: None immediate. PROCEDURE: Informed consent was obtained from the patient following an explanation of the procedure, risks, benefits and alternatives. A time out was performed prior to the initiation  of the procedure. The patient was positioned supine on the CT table and a limited CT was performed for procedural planning demonstrating unchanged size and appearance of the spiculated at least 5.1 x 4.3 cm mass within the soft tissues adjacent to superior anterior aspect of the right ilium (image 23, series 3). The table position was marked and the lesion was identified sonographically. The procedure was planned. The operative site was prepped and draped in the usual sterile fashion. Under direct ultrasound guidance, a 17 gauge coaxial needle was advanced into the peripheral aspect of the mass spiculated soft tissue mass after lying soft tissues were anesthetized 1% lidocaine with epinephrine. An ultrasound image was saved procedural documentation purposes. Appropriate position was confirmed with CT imaging. Next, 8 core needle biopsies were obtained of the mass within 18 gauge core needle biopsy device under direct ultrasound guidance. Note, multiple samples were attempted to be obtained utilizing 3 different biopsy devices given overall scant tissue acquisition due to the fibrous nature of the mass. The co-axial needle was removed and superficial hemostasis was achieved with manual compression. A dressing was placed. The patient tolerated the procedure well without immediate postprocedural complication. IMPRESSION: Technically successful ultrasound and CT guided core needle biopsy of hypermetabolic spiculated soft tissue mass adjacent to the anterior superior aspect of the right ilium. Note, multiple samples were attempted to be obtained utilizing 3 different biopsy devices given overall scant tissue acquisition due to the fibrous nature of the mass. Electronically Signed   By: Sandi Mariscal M.D.   On: 05/19/2020 12:58   NM PET Image Initial (PI) Skull Base To Thigh  Result Date: 05/06/2020 CLINICAL DATA:  Initial treatment strategy for left lower lobe lung mass on chest CT. EXAM: NUCLEAR MEDICINE PET SKULL  BASE TO THIGH TECHNIQUE: 10.9 mCi F-18 FDG was injected intravenously. Full-ring PET imaging was performed from the skull base to thigh after the radiotracer. CT data was obtained and used for attenuation correction and anatomic localization. Fasting blood glucose: 138 mg/dl COMPARISON:  05/03/2020 chest CT FINDINGS: Mediastinal blood pool activity: SUV max 2.5 Liver activity: SUV max NA NECK: No areas of abnormal hypermetabolism. Incidental CT findings: No cervical adenopathy. CHEST: No thoracic nodal hypermetabolism. The left lower lobe lobulated lung mass is hypermetabolic at 3.1 x 1.9 cm and a S.U.V. max of 9.9 on 99/3. Smaller nodules are primarily below PET resolution. A 1.0 cm left lower lobe nodule on 114/3 is not hypermetabolic, at the low end of PET resolution. Incidental CT findings: Deferred to recent diagnostic CT. Coronary and aortic atherosclerosis with tiny hiatal hernia. ABDOMEN/PELVIS: No abdominopelvic parenchymal or nodal hypermetabolism. Incidental CT findings: Cholecystectomy. Normal adrenal glands. Abdominal aortic atherosclerosis. Abdominal wall laxity containing nonobstructive bowel loops. Globular enlarged uterus is likely related to underlying fibroids. A left pelvic, presumably ovarian 5.2 x 5.9 cm lesion demonstrates fat and ossific densities, consistent with a dermoid. SKELETON: Soft tissue density subcutaneous or intramuscular  mass just cephalad to the right iliac crest measures 4.9x4.1 cm and a S.U.V. max of 2.7 on 183/3. Separate more posterior right iliac focus of hypermetabolism may correspond to vague increased density. Example at a S.U.V. max of 6.6 on 196/3. Incidental CT findings: Peripherally sclerotic left iliac 1.2 cm lesion on 201/3 has indolent characteristics. IMPRESSION: 1. Hypermetabolic left lower lobe lung mass, consistent with primary bronchogenic carcinoma. 2. Right iliac hypermetabolism, highly suspicious for isolated osseous metastasis. 3. Soft tissue mass just  cephalad to the right iliac crest is nonspecific but most likely represents an intramuscular metastasis. 4. Smaller nonspecific pulmonary nodules, primarily below PET resolution. 5. Incidental findings, including: Coronary artery atherosclerosis. Aortic Atherosclerosis (ICD10-I70.0). Fibroid uterus. Left ovarian dermoid. Electronically Signed   By: Abigail Miyamoto M.D.   On: 05/06/2020 12:23   CT BIOPSY  Result Date: 05/19/2020 INDICATION: Concern for metastatic lung cancer. Please perform image guided biopsy of hypermetabolic soft tissue mass adjacent to the right ilium for tissue diagnostic purposes. EXAM: ULTRASOUND AND CT-GUIDED BIOPSY OF INDETERMINATE HYPERMETABOLIC MASS ADJACENT TO THE RIGHT ILIUM. COMPARISON:  PET-CT - 05/06/2020 MEDICATIONS: None. ANESTHESIA/SEDATION: Fentanyl 100 mcg IV; Versed 2 mg IV Sedation time: 19 minutes; The patient was continuously monitored during the procedure by the interventional radiology nurse under my direct supervision. CONTRAST:  None. COMPLICATIONS: None immediate. PROCEDURE: Informed consent was obtained from the patient following an explanation of the procedure, risks, benefits and alternatives. A time out was performed prior to the initiation of the procedure. The patient was positioned supine on the CT table and a limited CT was performed for procedural planning demonstrating unchanged size and appearance of the spiculated at least 5.1 x 4.3 cm mass within the soft tissues adjacent to superior anterior aspect of the right ilium (image 23, series 3). The table position was marked and the lesion was identified sonographically. The procedure was planned. The operative site was prepped and draped in the usual sterile fashion. Under direct ultrasound guidance, a 17 gauge coaxial needle was advanced into the peripheral aspect of the mass spiculated soft tissue mass after lying soft tissues were anesthetized 1% lidocaine with epinephrine. An ultrasound image was saved  procedural documentation purposes. Appropriate position was confirmed with CT imaging. Next, 8 core needle biopsies were obtained of the mass within 18 gauge core needle biopsy device under direct ultrasound guidance. Note, multiple samples were attempted to be obtained utilizing 3 different biopsy devices given overall scant tissue acquisition due to the fibrous nature of the mass. The co-axial needle was removed and superficial hemostasis was achieved with manual compression. A dressing was placed. The patient tolerated the procedure well without immediate postprocedural complication. IMPRESSION: Technically successful ultrasound and CT guided core needle biopsy of hypermetabolic spiculated soft tissue mass adjacent to the anterior superior aspect of the right ilium. Note, multiple samples were attempted to be obtained utilizing 3 different biopsy devices given overall scant tissue acquisition due to the fibrous nature of the mass. Electronically Signed   By: Sandi Mariscal M.D.   On: 05/19/2020 12:58    ASSESSMENT: Left lower lobe lung mass  PLAN:    1.  Left lower lobe lung mass: PET scan results from May 06, 2020 reviewed independently and reported as above with hypermetabolic left lower lobe lung mass highly suspicious for underlying malignancy.  Patient also noted to have a right iliac bony hypermetabolism as well as a soft tissue mass adjacent suspicious for isolated metastasis.  Soft tissue mass was biopsied on  May 19, 2020 which was negative for malignancy.  This lesion is still suspicious, so therefore we will continue to monitor closely.  Patient has an appointment scheduled on May 31, 2020 with pulmonary to discuss navigational bronchoscopy for biopsy and diagnosis.  Patient will return to clinic approximately 1 week after her biopsy to discuss the results and treatment planning.    I spent a total of 30 minutes reviewing chart data, face-to-face evaluation with the patient,  counseling and coordination of care as detailed above.   Patient expressed understanding and was in agreement with this plan. She also understands that She can call clinic at any time with any questions, concerns, or complaints.   Cancer Staging No matching staging information was found for the patient.  Lloyd Huger, MD   05/25/2020 10:42 AM

## 2020-05-25 ENCOUNTER — Encounter: Payer: Self-pay | Admitting: Oncology

## 2020-05-25 ENCOUNTER — Encounter: Payer: Self-pay | Admitting: *Deleted

## 2020-05-25 ENCOUNTER — Other Ambulatory Visit: Payer: Self-pay

## 2020-05-25 ENCOUNTER — Inpatient Hospital Stay: Payer: Medicaid Other | Attending: Oncology | Admitting: Oncology

## 2020-05-25 ENCOUNTER — Ambulatory Visit: Payer: Medicaid Other | Admitting: Oncology

## 2020-05-25 VITALS — BP 143/78 | HR 72 | Temp 98.0°F | Resp 20 | Wt 228.3 lb

## 2020-05-25 DIAGNOSIS — Z87891 Personal history of nicotine dependence: Secondary | ICD-10-CM | POA: Diagnosis not present

## 2020-05-25 DIAGNOSIS — C3432 Malignant neoplasm of lower lobe, left bronchus or lung: Secondary | ICD-10-CM | POA: Diagnosis not present

## 2020-05-25 DIAGNOSIS — R918 Other nonspecific abnormal finding of lung field: Secondary | ICD-10-CM | POA: Insufficient documentation

## 2020-05-25 NOTE — Progress Notes (Signed)
Patient here today for follow up, results.

## 2020-05-25 NOTE — Progress Notes (Signed)
  Oncology Nurse Navigator Documentation  Navigator Location: [facility]-Med Onc (05/25/20 1100)   )Navigator Encounter Type: Follow-up Appt (05/25/20 1100)                     Patient Visit Type: MedOnc (05/25/20 1100)   Barriers/Navigation Needs: Coordination of Care (05/25/20 1100)   Interventions: Coordination of Care (05/25/20 1100)   Coordination of Care: Appts;Broncoscopy (05/25/20 1100)         Met with patient during follow up visit with Dr. Grayland Ormond to discuss recent biopsy results. All questions answered during visit. Reviewed upcoming appts. Informed that will be scheduled for follow up about 1 week after her bronchoscopy with Dr. Patsey Berthold. Instructed pt to call with any further questions or needs. Pt verbalized understanding. Nothing further needed at this time.         Time Spent with Patient: 30 (05/25/20 1100)

## 2020-05-26 ENCOUNTER — Institutional Professional Consult (permissible substitution): Payer: Medicaid Other | Admitting: Pulmonary Disease

## 2020-05-26 ENCOUNTER — Ambulatory Visit: Payer: Medicaid Other | Admitting: Oncology

## 2020-05-31 ENCOUNTER — Telehealth: Payer: Self-pay | Admitting: Pulmonary Disease

## 2020-05-31 ENCOUNTER — Ambulatory Visit (INDEPENDENT_AMBULATORY_CARE_PROVIDER_SITE_OTHER): Payer: Medicaid Other | Admitting: Pulmonary Disease

## 2020-05-31 ENCOUNTER — Encounter: Payer: Self-pay | Admitting: Pulmonary Disease

## 2020-05-31 ENCOUNTER — Other Ambulatory Visit: Payer: Self-pay

## 2020-05-31 VITALS — BP 148/88 | HR 78 | Temp 96.9°F | Ht 62.0 in | Wt 224.8 lb

## 2020-05-31 DIAGNOSIS — J449 Chronic obstructive pulmonary disease, unspecified: Secondary | ICD-10-CM | POA: Diagnosis not present

## 2020-05-31 DIAGNOSIS — G4733 Obstructive sleep apnea (adult) (pediatric): Secondary | ICD-10-CM | POA: Diagnosis not present

## 2020-05-31 DIAGNOSIS — Z6841 Body Mass Index (BMI) 40.0 and over, adult: Secondary | ICD-10-CM

## 2020-05-31 DIAGNOSIS — R918 Other nonspecific abnormal finding of lung field: Secondary | ICD-10-CM

## 2020-05-31 DIAGNOSIS — Z9989 Dependence on other enabling machines and devices: Secondary | ICD-10-CM

## 2020-05-31 MED ORDER — TRELEGY ELLIPTA 100-62.5-25 MCG/INH IN AEPB: 1.0000 | INHALATION_SPRAY | Freq: Every day | RESPIRATORY_TRACT | 0 refills | Status: AC

## 2020-05-31 NOTE — Patient Instructions (Signed)
We are going to put you back on Trelegy Ellipta.  We provided you samples for 1 month.  Your procedure for Monday, 20 December at 1 PM this will be in the same day surgery center at the main hospital.  See you in follow-up in 4 to 6 weeks time call sooner should any new problems arise.

## 2020-05-31 NOTE — Telephone Encounter (Signed)
Patient has been scheduled for bronchoscopy with navigation and [facility] on 06/07/2020. MI:WOEH mass [phone]

## 2020-05-31 NOTE — Progress Notes (Signed)
Subjective:    Patient ID: Natalie [name], female    DOB: 1961-03-18, 59 y.o.   MRN: [id number]  HPI This is a 59 year old former smoker (quit 2012) with a history as noted below who is referred for evaluation of a left lower lobe mass. Is kindly referred by Dr. Delight Hoh. In August of this year the patient was admitted to the ICU in severe DKA having had run out of his insulin. She has recently relocated to this area from [place] and initially had some difficulties obtaining her prescriptions. Chest x-ray performed at that time showed a nodular density on the lower lobe. Because of her severe DKA she required mechanical ventilation. She was subsequently liberated from the ventilator once her DKA corrected. Last chest x-ray post intubation was on 9 August and showed a persistent left midlung nodular density. Ventrally had a CT scan of the chest on 15 November obtained through the lung nodule clinic. There was a lobular mass on the left lower lobe most concerning for bronchogenic carcinoma. PET/CT followed showed no mediastinal adenopathy. The left lower lobe lung mass was hypermetabolic and measured 3.1 x 1.9 cm. The smaller nodule did not show hypermetabolism due to size. There was a focus of FDG uptake on the right iliac bone suspicious for malignancy. Subsequently the patient had this area biopsied but it was not malignant. We are now asked to evaluate for bronchoscopy for diagnosis of her left lower lobe lung mass.  With regards to this mass the patient is asymptomatic. She has had dyspnea no more than her usual. She does note that her dyspnea was markedly improved on Trelegy however, upon moving to [place] she has not been able to procure the Trelegy and is only on as needed albuterol. She has not had any weight loss or anorexia. No cough or sputum production. No hemoptysis. She does not endorse any other symptomatology.  Review of her old records from [place] show that  she was noted to have moderate COPD with the most recent PFTs being 19 February 2018 her FEV1 was 1.14 L or 51% predicted, FVC was 1.91 L or 68% predicted, FEV1/FVC was 60%. Residual volume was 127% she had significant bronchodilator response and DLCO of 63% when corrected for hemoglobin.  Patient also has a history of mild OSA with an AHI of 12.5 by her notes from pulmonary in [place]. Noted to have very poor compliance with her CPAP. REM AHI could go as high as 42.6.  Review of Systems A 10 point review of systems was performed and it is as noted above otherwise negative.  Past Medical History:  Diagnosis Date  . Asthma   . COPD (chronic obstructive pulmonary disease) ([place])   . CVA (cerebral vascular accident) ([place]) 2014  . Depression   . Diabetes mellitus ([place])   . Diabetes mellitus without complication ([place])   . Hepatitis C   . Hyperlipidemia   . Iron deficiency anemia   . Obesity   . OSA on CPAP    Mild, noncompliant with CPAP  . Paranoia ([place])   . Schizoaffective disorder, bipolar type [facility])    Patient Active Problem List   Diagnosis Date Noted  . Mass of lower lobe of left lung 05/08/2020  . DKA (diabetic ketoacidoses) 01/20/2020   . Past Surgical History:  Procedure Laterality Date  . BREAST CYST EXCISION Bilateral 2013   Benign  . CESAREAN SECTION    . CHOLECYSTECTOMY    . COLONOSCOPY    .  ESOPHAGOGASTRODUODENOSCOPY  2020   done in [place]  . INGUINAL HERNIA REPAIR Left 2002  . UMBILICAL HERNIA REPAIR       No family history on file.  History reviewed and no pertinent family history noted.  Social History   Tobacco Use  . Smoking status: Former Smoker    Packs/day: 2.00    Years: 38.00    Pack years: 76.00    Types: Cigarettes    Quit date: 2012    Years since quitting: 9.9  . Smokeless tobacco: Former Network engineer Use Topics  . Alcohol use: Yes    No Known Allergies  Current Meds  Medication Sig  . albuterol (VENTOLIN HFA) 108 (90 Base)  MCG/ACT inhaler Inhale 2 puffs into the lungs every 6 (six) hours as needed for wheezing or shortness of breath.  Marland Kitchen atorvastatin (LIPITOR) 40 MG tablet Take 40 mg by mouth at bedtime.  . benztropine (COGENTIN) 1 MG tablet Take 1 tablet by mouth at bedtime.  . citalopram (CELEXA) 20 MG tablet Take 20 mg by mouth every morning.  . insulin aspart (NOVOLOG) 100 UNIT/ML FlexPen Inject 9 Units into the skin 3 (three) times daily with meals. (Patient taking differently: Inject 4 Units into the skin 3 (three) times daily with meals.)  . insulin glargine (LANTUS) 100 unit/mL SOPN Inject 0.15 mLs (15 Units total) into the skin 2 (two) times daily. (Patient not taking: No sig reported)  . latanoprost (XALATAN) 0.005 % ophthalmic solution Place 1 drop into both eyes daily.  Marland Kitchen lidocaine (LIDODERM) 5 % Place 2 patches onto the skin See admin instructions. Apply 1 patch to each foot every 12 hours  . LORazepam (ATIVAN) 1 MG tablet Take 1 mg by mouth 2 (two) times daily.  . [DISCONTINUED] Multiple Vitamin (DAILY-VITE MULTIVITAMIN) TABS Take 1 tablet by mouth daily.   . [DISCONTINUED] TRELEGY ELLIPTA 100-62.5-25 MCG/INH AEPB Inhale 1 puff into the lungs daily.   . [DISCONTINUED] ziprasidone (GEODON) 40 MG capsule Take 1 capsule by mouth at bedtime.   Immunization History  Administered Date(s) Administered  . Moderna Sars-Covid-2 Vaccination 11/09/2019, 12/11/2019       Objective:   Physical Exam BP (!) 148/88 (BP Location: Left Arm, Cuff Size: Normal)   Pulse 78   Temp (!) 96.9 F (36.1 C) (Temporal)   Ht 5\' 2"  (1.575 m)   Wt 224 lb 12.8 oz (102 kg)   SpO2 98%   BMI 41.12 kg/m  GENERAL: Obese woman, presents in transport chair. No acute distress. Conversant, tearful and cooperative. HEAD: Normocephalic, atraumatic.  EYES: Pupils equal, round, reactive to light.  No scleral icterus.  MOUTH: Nose/mouth/throat not examined due to masking requirements for COVID 19. NECK: Supple. No thyromegaly. Trachea  midline. No JVD.  No adenopathy. PULMONARY: Good air entry bilaterally.  No adventitious sounds. CARDIOVASCULAR: S1 and S2. Regular rate and rhythm. No rubs, murmurs or gallops heard. ABDOMEN: Obese otherwise benign MUSCULOSKELETAL: No joint deformity, no clubbing, no edema.  NEUROLOGIC: No overt focal deficit, speech is fluent. SKIN: Intact,warm,dry. On limited exam, no rashes. PSYCH: Tearful, mildly anxious, behavior otherwise normal.    Chest CT performed May 03, 2020 showing a left lower lobe mass, independently reviewed:     PET/CT images:        Assessment & Plan:     ICD-10-CM   1. Mass of lower lobe of left lung  R91.8    Will need biopsy for diagnosis ENB planned for 20 December Carcinoma until proven  otherwise Patient agrees to proceed  2. COPD, moderate ([place])  J44.9    Previously followed in [place] PFTs at a later time  3. OSA on CPAP  G47.33    Z99.89    Patient with variable compliance with CPAP Diagnosed in [place] AHI 12.5  4. Morbid obesity with BMI of 40.0-44.9, adult ([place])  E66.01    Z68.41    This issue adds complexity to her management   Meds ordered this encounter  Medications  . Fluticasone-Umeclidin-Vilant (TRELEGY ELLIPTA) 100-62.5-25 MCG/INH AEPB    Sig: Inhale 1 puff into the lungs daily for 1 day.    Dispense:  28 each    Refill:  0   Discussion:  Patient will need be for diagnosis of a left lower lobe FDG avid mass. She understands the bronchoscopy will be done under general anesthesia. Benefits, limitations and potential complications of the procedure were discussed with the patient/family  including, but not limited to bleeding, hemoptysis, respiratory failure requiring intubation and/or prolongued mechanical ventilation, infection, pneumothorax (collapse of lung) requiring chest tube placement, stroke or even death. The patient agrees to proceed.  With regards to her COPD she noted improvement on  Trelegy. This has been discontinued due to her lack of insurance currently. We will proceed with giving her samples of Trelegy Ellipta, we were able to provide her at least 1 months worth.  We will see her in follow-up in 4 to 6 weeks time she is to contact us prior to that time should any new difficulties arise.  Renold Don, MD [facility]   *This note was dictated using voice recognition software/Dragon.  Despite best efforts to proofread, errors can occur which can change the meaning.  Any change was purely unintentional.

## 2020-05-31 NOTE — H&P (View-Only) (Signed)
Subjective:    Patient ID: Natalie [name], female    DOB: 1961-06-16, 59 y.o.   MRN: [id number]  HPI This is a 59 year old former smoker (quit 2012) with a history as noted below who is referred for evaluation of a left lower lobe mass. Is kindly referred by Dr. Delight Hoh. In August of this year the patient was admitted to the ICU in severe DKA having had run out of his insulin. She has recently relocated to this area from [place] and initially had some difficulties obtaining her prescriptions. Chest x-ray performed at that time showed a nodular density on the lower lobe. Because of her severe DKA she required mechanical ventilation. She was subsequently liberated from the ventilator once her DKA corrected. Last chest x-ray post intubation was on 9 August and showed a persistent left midlung nodular density. Ventrally had a CT scan of the chest on 15 November obtained through the lung nodule clinic. There was a lobular mass on the left lower lobe most concerning for bronchogenic carcinoma. PET/CT followed showed no mediastinal adenopathy. The left lower lobe lung mass was hypermetabolic and measured 3.1 x 1.9 cm. The smaller nodule did not show hypermetabolism due to size. There was a focus of FDG uptake on the right iliac bone suspicious for malignancy. Subsequently the patient had this area biopsied but it was not malignant. We are now asked to evaluate for bronchoscopy for diagnosis of her left lower lobe lung mass.  With regards to this mass the patient is asymptomatic. She has had dyspnea no more than her usual. She does note that her dyspnea was markedly improved on Trelegy however, upon moving to [place] she has not been able to procure the Trelegy and is only on as needed albuterol. She has not had any weight loss or anorexia. No cough or sputum production. No hemoptysis. She does not endorse any other symptomatology.  Review of her old records from [place] show that  she was noted to have moderate COPD with the most recent PFTs being 19 February 2018 her FEV1 was 1.14 L or 51% predicted, FVC was 1.91 L or 68% predicted, FEV1/FVC was 60%. Residual volume was 127% she had significant bronchodilator response and DLCO of 63% when corrected for hemoglobin.  Patient also has a history of mild OSA with an AHI of 12.5 by her notes from pulmonary in [place]. Noted to have very poor compliance with her CPAP. REM AHI could go as high as 42.6.  Review of Systems A 10 point review of systems was performed and it is as noted above otherwise negative.  Past Medical History:  Diagnosis Date  . Asthma   . COPD (chronic obstructive pulmonary disease) ([place])   . CVA (cerebral vascular accident) ([place]) 2014  . Depression   . Diabetes mellitus ([place])   . Diabetes mellitus without complication ([place])   . Hepatitis C   . Hyperlipidemia   . Iron deficiency anemia   . Obesity   . OSA on CPAP    Mild, noncompliant with CPAP  . Paranoia ([place])   . Schizoaffective disorder, bipolar type [facility])    Patient Active Problem List   Diagnosis Date Noted  . Mass of lower lobe of left lung 05/08/2020  . DKA (diabetic ketoacidoses) 01/20/2020   . Past Surgical History:  Procedure Laterality Date  . BREAST CYST EXCISION Bilateral 2013   Benign  . CESAREAN SECTION    . CHOLECYSTECTOMY    . COLONOSCOPY    .  ESOPHAGOGASTRODUODENOSCOPY  2020   done in [place]  . INGUINAL HERNIA REPAIR Left 2002  . UMBILICAL HERNIA REPAIR       No family history on file.  History reviewed and no pertinent family history noted.  Social History   Tobacco Use  . Smoking status: Former Smoker    Packs/day: 2.00    Years: 38.00    Pack years: 76.00    Types: Cigarettes    Quit date: 2012    Years since quitting: 9.9  . Smokeless tobacco: Former Network engineer Use Topics  . Alcohol use: Yes    No Known Allergies  Current Meds  Medication Sig  . albuterol (VENTOLIN HFA) 108 (90 Base)  MCG/ACT inhaler Inhale 2 puffs into the lungs every 6 (six) hours as needed for wheezing or shortness of breath.  Marland Kitchen atorvastatin (LIPITOR) 40 MG tablet Take 40 mg by mouth at bedtime.  . benztropine (COGENTIN) 1 MG tablet Take 1 tablet by mouth at bedtime.  . citalopram (CELEXA) 20 MG tablet Take 20 mg by mouth every morning.  . insulin aspart (NOVOLOG) 100 UNIT/ML FlexPen Inject 9 Units into the skin 3 (three) times daily with meals. (Patient taking differently: Inject 4 Units into the skin 3 (three) times daily with meals.)  . insulin glargine (LANTUS) 100 unit/mL SOPN Inject 0.15 mLs (15 Units total) into the skin 2 (two) times daily. (Patient not taking: No sig reported)  . latanoprost (XALATAN) 0.005 % ophthalmic solution Place 1 drop into both eyes daily.  Marland Kitchen lidocaine (LIDODERM) 5 % Place 2 patches onto the skin See admin instructions. Apply 1 patch to each foot every 12 hours  . LORazepam (ATIVAN) 1 MG tablet Take 1 mg by mouth 2 (two) times daily.  . [DISCONTINUED] Multiple Vitamin (DAILY-VITE MULTIVITAMIN) TABS Take 1 tablet by mouth daily.   . [DISCONTINUED] TRELEGY ELLIPTA 100-62.5-25 MCG/INH AEPB Inhale 1 puff into the lungs daily.   . [DISCONTINUED] ziprasidone (GEODON) 40 MG capsule Take 1 capsule by mouth at bedtime.   Immunization History  Administered Date(s) Administered  . Moderna Sars-Covid-2 Vaccination 11/09/2019, 12/11/2019       Objective:   Physical Exam BP (!) 148/88 (BP Location: Left Arm, Cuff Size: Normal)   Pulse 78   Temp (!) 96.9 F (36.1 C) (Temporal)   Ht 5\' 2"  (1.575 m)   Wt 224 lb 12.8 oz (102 kg)   SpO2 98%   BMI 41.12 kg/m  GENERAL: Obese woman, presents in transport chair. No acute distress. Conversant, tearful and cooperative. HEAD: Normocephalic, atraumatic.  EYES: Pupils equal, round, reactive to light.  No scleral icterus.  MOUTH: Nose/mouth/throat not examined due to masking requirements for COVID 19. NECK: Supple. No thyromegaly. Trachea  midline. No JVD.  No adenopathy. PULMONARY: Good air entry bilaterally.  No adventitious sounds. CARDIOVASCULAR: S1 and S2. Regular rate and rhythm. No rubs, murmurs or gallops heard. ABDOMEN: Obese otherwise benign MUSCULOSKELETAL: No joint deformity, no clubbing, no edema.  NEUROLOGIC: No overt focal deficit, speech is fluent. SKIN: Intact,warm,dry. On limited exam, no rashes. PSYCH: Tearful, mildly anxious, behavior otherwise normal.    Chest CT performed May 03, 2020 showing a left lower lobe mass, independently reviewed:     PET/CT images:        Assessment & Plan:     ICD-10-CM   1. Mass of lower lobe of left lung  R91.8    Will need biopsy for diagnosis ENB planned for 20 December Carcinoma until proven  otherwise Patient agrees to proceed  2. COPD, moderate ([place])  J44.9    Previously followed in [place] PFTs at a later time  3. OSA on CPAP  G47.33    Z99.89    Patient with variable compliance with CPAP Diagnosed in [place] AHI 12.5  4. Morbid obesity with BMI of 40.0-44.9, adult ([place])  E66.01    Z68.41    This issue adds complexity to her management   Meds ordered this encounter  Medications  . Fluticasone-Umeclidin-Vilant (TRELEGY ELLIPTA) 100-62.5-25 MCG/INH AEPB    Sig: Inhale 1 puff into the lungs daily for 1 day.    Dispense:  28 each    Refill:  0   Discussion:  Patient will need be for diagnosis of a left lower lobe FDG avid mass. She understands the bronchoscopy will be done under general anesthesia. Benefits, limitations and potential complications of the procedure were discussed with the patient/family  including, but not limited to bleeding, hemoptysis, respiratory failure requiring intubation and/or prolongued mechanical ventilation, infection, pneumothorax (collapse of lung) requiring chest tube placement, stroke or even death. The patient agrees to proceed.  With regards to her COPD she noted improvement on  Trelegy. This has been discontinued due to her lack of insurance currently. We will proceed with giving her samples of Trelegy Ellipta, we were able to provide her at least 1 months worth.  We will see her in follow-up in 4 to 6 weeks time she is to contact us prior to that time should any new difficulties arise.  Renold Don, MD [facility]   *This note was dictated using voice recognition software/Dragon.  Despite best efforts to proofread, errors can occur which can change the meaning.  Any change was purely unintentional.

## 2020-06-01 ENCOUNTER — Encounter: Payer: Self-pay | Admitting: Pulmonary Disease

## 2020-06-01 NOTE — Telephone Encounter (Signed)
Pre admit 12/15 between 1-5 and covid test [facility]  Patient is aware of above dates/times. She voiced her understanding and had no further questions. Nothing further is needed.

## 2020-06-02 ENCOUNTER — Other Ambulatory Visit
Admission: RE | Admit: 2020-06-02 | Discharge: 2020-06-02 | Disposition: A | Discharge status: Home / Self Care | Payer: Medicaid Other | Source: Ambulatory Visit | Attending: Pulmonary Disease | Admitting: Pulmonary Disease

## 2020-06-02 ENCOUNTER — Other Ambulatory Visit: Payer: Self-pay

## 2020-06-02 DIAGNOSIS — Z20822 Contact with and (suspected) exposure to covid-19: Secondary | ICD-10-CM | POA: Insufficient documentation

## 2020-06-02 DIAGNOSIS — Z01812 Encounter for preprocedural laboratory examination: Secondary | ICD-10-CM | POA: Insufficient documentation

## 2020-06-02 HISTORY — DX: Personal history of urinary calculi: Z87.442

## 2020-06-02 NOTE — Telephone Encounter (Signed)
Lauralyn Primes w/ [facility] program called to get more details about bronch procedure. Can be reached at [phone].

## 2020-06-02 NOTE — Telephone Encounter (Signed)
This patient has [place] and I spoke with Varney Biles gave her the codes [phone] Auth # [id number]

## 2020-06-02 NOTE — Patient Instructions (Signed)
Your procedure is scheduled on: Monday June 07, 2020. Report to Day Surgery inside [facility] 2nd floor (stop by registration desk first). To find out your arrival time please call [phone] between 1PM - 3PM on Friday June 04, 2020.  Remember: Instructions that are not followed completely may result in serious medical risk,  up to and including death, or upon the discretion of your surgeon and anesthesiologist your  surgery may need to be rescheduled.     _X__ 1. Do not eat food after midnight the night before your procedure.                 No chewing gum or hard candies. You may drink clear liquids up to 2 hours                 before you are scheduled to arrive for your surgery- DO not drink clear                 liquids within 2 hours of the start of your surgery.                 Clear Liquids include:  water, apple juice without pulp, clear Gatorade, G2 or                  Gatorade Zero (avoid Red/Purple/Blue), Black Coffee or Tea (Do not add                 anything to coffee or tea).  __X__2.  On the morning of surgery brush your teeth with toothpaste and water, you                may rinse your mouth with mouthwash if you wish.  Do not swallow any toothpaste of mouthwash.     _X__ 3.  No Alcohol for 24 hours before or after surgery.   _X__ 4.  Do Not Smoke or use e-cigarettes For 24 Hours Prior to Your Surgery.                 Do not use any chewable tobacco products for at least 6 hours prior to                 Surgery.  _X__  5.  Do not use any recreational drugs (marijuana, cocaine, heroin, ecstasy, MDMA or other)                For at least one week prior to your surgery.  Combination of these drugs with anesthesia                May have life threatening results.  __x__ 6.  Notify your doctor if there is any change in your medical condition      (cold, fever, infections).     Do not wear jewelry, make-up, hairpins, clips or nail  polish. Do not wear lotions, powders, or perfumes. . Do not shave 48 hours prior to surgery. Men may shave face and neck. Do not bring valuables to the hospital.    [facility] is not responsible for any belongings or valuables.  Contacts, dentures or bridgework may not be worn into surgery. Leave your suitcase in the car. After surgery it may be brought to your room. For patients admitted to the hospital, discharge time is determined by your treatment team.   Patients discharged the day of surgery will not be allowed to drive home.   Make arrangements for someone  to be with you for the first 24 hours of your Same Day Discharge.   __X__ Take these medicines the morning of surgery with A SIP OF WATER:    1. citalopram (CELEXA) 20 MG  2. gabapentin (NEURONTIN) 300 MG  3. LORazepam (ATIVAN) 1 MG  4. acetaminophen (TYLENOL) 650 MG CR  ____ Fleet Enema (as directed)   ____ Use CHG Soap (or wipes) as directed  ____ Use Benzoyl Peroxide Gel as instructed  __X__ Use inhalers on the day of surgery  Fluticasone-Umeclidin-Vilant (TRELEGY ELLIPTA) 100-62.5-25 MCG/INH AEPB  albuterol (VENTOLIN HFA) 108 (90 Base) MCG/ACT inhaler  ____ Stop metformin 2 days prior to surgery    __X__ Take 1/2 of usual insulin insulin detemir (LEVEMIR) 100 UNIT/ML  dose the night before surgery. No insulin the morning          of surgery.   ____ Stop Coumadin/Plavix/aspirin   __X__ Stop Anti-inflammatories such as Ibuprofen, Aleve, Advil, naproxen, aspirin and or BC powders.    __X__ Stop supplements until after surgery.    __X__ Do not start any herbal supplements before your procedure.    If you have any questions regarding your pre-procedure instructions,  Please call Pre-admit Testing at [phone].

## 2020-06-02 NOTE — Telephone Encounter (Signed)
Spoke to [employer] with [facility]. She had additional questions regarding upcoming appointments/prodcedure. I have answered all questions and provided dates of upcoming appointments and instructions for day of procedure and prep.  Nothing further needed.

## 2020-06-03 ENCOUNTER — Other Ambulatory Visit
Admission: RE | Admit: 2020-06-03 | Discharge: 2020-06-03 | Disposition: A | Discharge status: Home / Self Care | Payer: Medicaid Other | Source: Ambulatory Visit | Attending: Pulmonary Disease | Admitting: Pulmonary Disease

## 2020-06-03 DIAGNOSIS — Z01812 Encounter for preprocedural laboratory examination: Secondary | ICD-10-CM | POA: Diagnosis present

## 2020-06-03 DIAGNOSIS — Z20822 Contact with and (suspected) exposure to covid-19: Secondary | ICD-10-CM | POA: Diagnosis not present

## 2020-06-03 LAB — BASIC METABOLIC PANEL
Anion gap: 14 (ref 5–15)
BUN: 14 mg/dL (ref 8–23)
CO2: 26 mmol/L (ref 22–32)
Calcium: 9.8 mg/dL (ref 8.9–10.3)
Chloride: 99 mmol/L (ref 98–111)
Creatinine, Ser: 0.65 mg/dL (ref 0.44–1.00)
GFR, Estimated: >60 mL/min (ref >60)
Glucose, Bld: 136 mg/dL — ABNORMAL HIGH (ref 70–99)
Potassium: 3.6 mmol/L (ref 3.5–5.1)
Sodium: 139 mmol/L (ref 135–145)

## 2020-06-03 LAB — SARS CORONAVIRUS 2 (TAT 6-24 HRS): SARS Coronavirus 2: NEGATIVE

## 2020-06-06 MED ORDER — CHLORHEXIDINE GLUCONATE 0.12 % MT SOLN: 15.0000 mL | Freq: Once | OROMUCOSAL | Status: AC

## 2020-06-06 MED ORDER — ORAL CARE MOUTH RINSE: 15.0000 mL | Freq: Once | OROMUCOSAL | Status: AC

## 2020-06-06 MED ORDER — SODIUM CHLORIDE 0.9 % IV SOLN: INTRAVENOUS | Status: DC

## 2020-06-06 MED ORDER — FAMOTIDINE 20 MG PO TABS: 20.0000 mg | ORAL_TABLET | Freq: Once | ORAL | Status: AC

## 2020-06-06 MED ORDER — SODIUM CHLORIDE 0.9 % IV SOLN: Freq: Once | INTRAVENOUS | Status: AC

## 2020-06-06 MED ORDER — BUTAMBEN-TETRACAINE-BENZOCAINE 2-2-14 % EX AERO
1.0000 | INHALATION_SPRAY | Freq: Once | CUTANEOUS | Status: DC
  Filled 2020-06-06: qty 20

## 2020-06-07 ENCOUNTER — Ambulatory Visit: Payer: Medicaid Other

## 2020-06-07 ENCOUNTER — Ambulatory Visit: Payer: Medicaid Other | Admitting: Anesthesiology

## 2020-06-07 ENCOUNTER — Ambulatory Visit
Admission: RE | Admit: 2020-06-07 | Discharge: 2020-06-07 | Disposition: A | Discharge status: Home / Self Care | Payer: Medicaid Other | Attending: Pulmonary Disease | Admitting: Pulmonary Disease

## 2020-06-07 ENCOUNTER — Encounter: Payer: Self-pay | Admitting: Pulmonary Disease

## 2020-06-07 ENCOUNTER — Other Ambulatory Visit: Payer: Self-pay

## 2020-06-07 ENCOUNTER — Encounter
Admission: RE | Disposition: A | Discharge status: Home / Self Care | Payer: Self-pay | Source: Home / Self Care | Attending: Pulmonary Disease

## 2020-06-07 DIAGNOSIS — Z6841 Body Mass Index (BMI) 40.0 and over, adult: Secondary | ICD-10-CM | POA: Diagnosis not present

## 2020-06-07 DIAGNOSIS — G4733 Obstructive sleep apnea (adult) (pediatric): Secondary | ICD-10-CM | POA: Insufficient documentation

## 2020-06-07 DIAGNOSIS — J449 Chronic obstructive pulmonary disease, unspecified: Secondary | ICD-10-CM | POA: Insufficient documentation

## 2020-06-07 DIAGNOSIS — Z8673 Personal history of transient ischemic attack (TIA), and cerebral infarction without residual deficits: Secondary | ICD-10-CM | POA: Diagnosis not present

## 2020-06-07 DIAGNOSIS — R918 Other nonspecific abnormal finding of lung field: Secondary | ICD-10-CM | POA: Diagnosis not present

## 2020-06-07 DIAGNOSIS — Z794 Long term (current) use of insulin: Secondary | ICD-10-CM | POA: Insufficient documentation

## 2020-06-07 DIAGNOSIS — C3432 Malignant neoplasm of lower lobe, left bronchus or lung: Secondary | ICD-10-CM | POA: Insufficient documentation

## 2020-06-07 DIAGNOSIS — Z9889 Other specified postprocedural states: Secondary | ICD-10-CM

## 2020-06-07 DIAGNOSIS — Z87891 Personal history of nicotine dependence: Secondary | ICD-10-CM | POA: Diagnosis not present

## 2020-06-07 DIAGNOSIS — Z79899 Other long term (current) drug therapy: Secondary | ICD-10-CM | POA: Diagnosis not present

## 2020-06-07 HISTORY — PX: VIDEO BRONCHOSCOPY WITH ENDOBRONCHIAL NAVIGATION: SHX6175

## 2020-06-07 LAB — GLUCOSE, CAPILLARY
Glucose-Capillary: 113 mg/dL — ABNORMAL HIGH (ref 70–99)
Glucose-Capillary: 146 mg/dL — ABNORMAL HIGH (ref 70–99)

## 2020-06-07 IMAGING — DX DG CHEST 1V PORT
1 series · 1 of 1 positions shown · non-contrast
Comparison: PET-CT [DATE]

CLINICAL DATA: Status post biopsy

EXAM:
PORTABLE CHEST 1 VIEW

[chest ap]
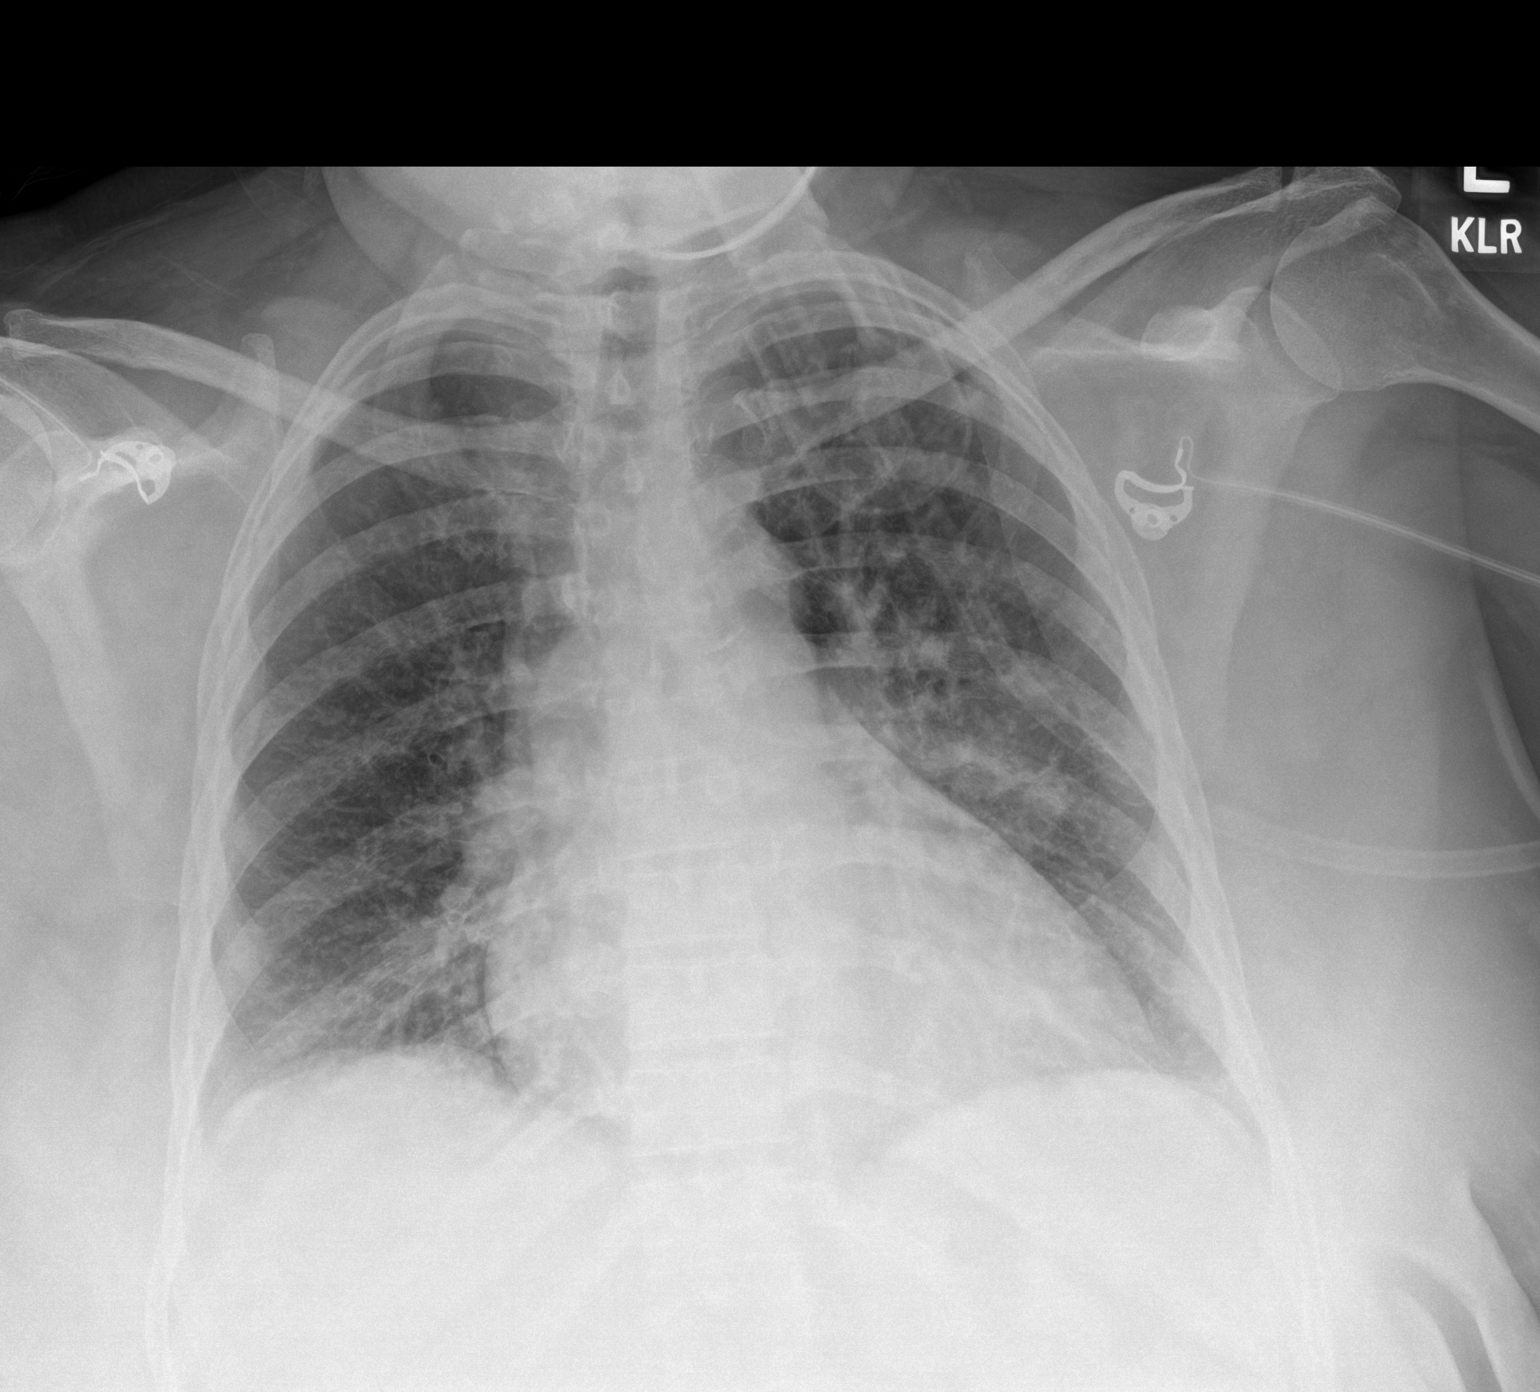

[1 of 1 positions shown; findings below may reference images not displayed]

FINDINGS: No evident pneumothorax. The mass in the left lower lobe seen on
previous PET-CT examination is not well-defined currently. There is
ill-defined opacity in this area of the left lower lobe. Lungs
elsewhere are clear. Heart is upper normal in size with pulmonary
vascularity normal. No adenopathy. No bone lesions.
IMPRESSION: No pneumothorax. Ill-defined opacity in area of previous mass left
lower lobe, much better seen on CT. Question residual hemorrhage in
this area from recent biopsy.

Lungs elsewhere clear.  Heart upper normal in size.

## 2020-06-07 SURGERY — VIDEO BRONCHOSCOPY WITH ENDOBRONCHIAL NAVIGATION
Anesthesia: General

## 2020-06-07 MED ORDER — MIDAZOLAM HCL 2 MG/2ML IJ SOLN
INTRAMUSCULAR | Status: AC
  Filled 2020-06-07: qty 2

## 2020-06-07 MED ORDER — IPRATROPIUM-ALBUTEROL 0.5-2.5 (3) MG/3ML IN SOLN
RESPIRATORY_TRACT | Status: AC
  Administered 2020-06-07: 14:00:00 3 mL via RESPIRATORY_TRACT
  Filled 2020-06-07: qty 3

## 2020-06-07 MED ORDER — IPRATROPIUM-ALBUTEROL 0.5-2.5 (3) MG/3ML IN SOLN: 3.0000 mL | Freq: Once | RESPIRATORY_TRACT | Status: AC

## 2020-06-07 MED ORDER — PROPOFOL 10 MG/ML IV BOLUS
INTRAVENOUS | Status: DC | PRN
  Administered 2020-06-07: 120 mg via INTRAVENOUS

## 2020-06-07 MED ORDER — LIDOCAINE HCL (PF) 2 % IJ SOLN
INTRAMUSCULAR | Status: AC
  Filled 2020-06-07: qty 5

## 2020-06-07 MED ORDER — ACETAMINOPHEN 500 MG PO TABS
ORAL_TABLET | ORAL | Status: AC
  Administered 2020-06-07: 15:00:00 1000 mg via ORAL
  Filled 2020-06-07: qty 2

## 2020-06-07 MED ORDER — ONDANSETRON HCL 4 MG/2ML IJ SOLN
INTRAMUSCULAR | Status: AC
  Filled 2020-06-07: qty 2

## 2020-06-07 MED ORDER — PROPOFOL 10 MG/ML IV BOLUS
INTRAVENOUS | Status: AC
  Filled 2020-06-07: qty 20

## 2020-06-07 MED ORDER — ROCURONIUM BROMIDE 100 MG/10ML IV SOLN
INTRAVENOUS | Status: DC | PRN
  Administered 2020-06-07 (×2): 40 mg via INTRAVENOUS

## 2020-06-07 MED ORDER — METOPROLOL TARTRATE 5 MG/5ML IV SOLN
INTRAVENOUS | Status: DC | PRN
  Administered 2020-06-07: 1 mg via INTRAVENOUS

## 2020-06-07 MED ORDER — PHENYLEPHRINE HCL (PRESSORS) 10 MG/ML IV SOLN
INTRAVENOUS | Status: AC
  Filled 2020-06-07: qty 1

## 2020-06-07 MED ORDER — CHLORHEXIDINE GLUCONATE 0.12 % MT SOLN
OROMUCOSAL | Status: AC
  Administered 2020-06-07: 12:00:00 15 mL via OROMUCOSAL
  Filled 2020-06-07: qty 15

## 2020-06-07 MED ORDER — ONDANSETRON HCL 4 MG/2ML IJ SOLN: 4.0000 mg | Freq: Once | INTRAMUSCULAR | Status: DC | PRN

## 2020-06-07 MED ORDER — ONDANSETRON HCL 4 MG/2ML IJ SOLN
INTRAMUSCULAR | Status: DC | PRN
  Administered 2020-06-07: 4 mg via INTRAVENOUS

## 2020-06-07 MED ORDER — SUGAMMADEX SODIUM 500 MG/5ML IV SOLN
INTRAVENOUS | Status: AC
  Filled 2020-06-07: qty 5

## 2020-06-07 MED ORDER — IPRATROPIUM-ALBUTEROL 0.5-2.5 (3) MG/3ML IN SOLN
RESPIRATORY_TRACT | Status: AC
  Administered 2020-06-07: 15:00:00 3 mL via RESPIRATORY_TRACT
  Filled 2020-06-07: qty 3

## 2020-06-07 MED ORDER — MIDAZOLAM HCL 2 MG/2ML IJ SOLN
INTRAMUSCULAR | Status: DC | PRN
  Administered 2020-06-07: 2 mg via INTRAVENOUS

## 2020-06-07 MED ORDER — FENTANYL CITRATE (PF) 100 MCG/2ML IJ SOLN
INTRAMUSCULAR | Status: AC
  Filled 2020-06-07: qty 2

## 2020-06-07 MED ORDER — ACETAMINOPHEN 500 MG PO TABS: 1000.0000 mg | ORAL_TABLET | Freq: Once | ORAL | Status: AC

## 2020-06-07 MED ORDER — ROCURONIUM BROMIDE 10 MG/ML (PF) SYRINGE
PREFILLED_SYRINGE | INTRAVENOUS | Status: AC
  Filled 2020-06-07: qty 10

## 2020-06-07 MED ORDER — DEXAMETHASONE SODIUM PHOSPHATE 10 MG/ML IJ SOLN
INTRAMUSCULAR | Status: DC | PRN
  Administered 2020-06-07: 5 mg via INTRAVENOUS

## 2020-06-07 MED ORDER — FAMOTIDINE 20 MG PO TABS
ORAL_TABLET | ORAL | Status: AC
  Administered 2020-06-07: 12:00:00 20 mg via ORAL
  Filled 2020-06-07: qty 1

## 2020-06-07 MED ORDER — FENTANYL CITRATE (PF) 100 MCG/2ML IJ SOLN: 25.0000 ug | INTRAMUSCULAR | Status: DC | PRN

## 2020-06-07 MED ORDER — SUGAMMADEX SODIUM 200 MG/2ML IV SOLN
INTRAVENOUS | Status: DC | PRN
  Administered 2020-06-07: 300 mg via INTRAVENOUS

## 2020-06-07 MED ORDER — METOPROLOL TARTRATE 5 MG/5ML IV SOLN
INTRAVENOUS | Status: AC
  Filled 2020-06-07: qty 5

## 2020-06-07 MED ORDER — FENTANYL CITRATE (PF) 100 MCG/2ML IJ SOLN
INTRAMUSCULAR | Status: DC | PRN
  Administered 2020-06-07: 25 ug via INTRAVENOUS
  Administered 2020-06-07: 50 ug via INTRAVENOUS

## 2020-06-07 MED ORDER — PHENYLEPHRINE HCL (PRESSORS) 10 MG/ML IV SOLN
INTRAVENOUS | Status: DC | PRN
  Administered 2020-06-07: 150 ug via INTRAVENOUS
  Administered 2020-06-07 (×3): 50 ug via INTRAVENOUS
  Administered 2020-06-07: 150 ug via INTRAVENOUS
  Administered 2020-06-07: 100 ug via INTRAVENOUS
  Administered 2020-06-07: 150 ug via INTRAVENOUS
  Administered 2020-06-07: 100 ug via INTRAVENOUS

## 2020-06-07 MED ORDER — DEXAMETHASONE SODIUM PHOSPHATE 10 MG/ML IJ SOLN
INTRAMUSCULAR | Status: AC
  Filled 2020-06-07: qty 1

## 2020-06-07 MED ORDER — LIDOCAINE HCL (CARDIAC) PF 100 MG/5ML IV SOSY
PREFILLED_SYRINGE | INTRAVENOUS | Status: DC | PRN
  Administered 2020-06-07: 40 mg via INTRAVENOUS
  Administered 2020-06-07: 60 mg via INTRAVENOUS

## 2020-06-07 NOTE — Interval H&P Note (Signed)
History and Physical Interval Note:  06/07/2020 12:18 PM  Natalie [name]  has presented today for surgery, with the diagnosis of LUNG MASS.  The various methods of treatment have been discussed with the patient and family. After consideration of risks, benefits and other options for treatment, the patient has consented to  Procedure(s): [place] (N/A) as a surgical intervention.  The patient's history has been reviewed, patient examined, no change in status, stable for surgery.  I have reviewed the patient's chart and labs.  Questions were answered to the patient's satisfaction.     Vernard Gambles

## 2020-06-07 NOTE — Progress Notes (Signed)
Dr. Kayleen Memos notified patient O2 saturation reading 88-90s RA. Per Dr. Kayleen Memos pt is to be administered DuoNeb tx and will be able to be d/c home after treatment. Pt alert and oriented, incentive spirometer used to assist with deep breathing, pt instructed to cough and deep breath.Pt verbalized understanding.

## 2020-06-07 NOTE — Transfer of Care (Signed)
Immediate Anesthesia Transfer of Care Note  Patient: Natalie [name]  Procedure(s) Performed: VIDEO BRONCHOSCOPY WITH ENDOBRONCHIAL NAVIGATION (N/A )  Patient Location: PACU  Anesthesia Type:General  Level of Consciousness: awake, drowsy and patient cooperative  Airway & Oxygen Therapy: Patient Spontanous Breathing and Patient connected to face mask oxygen  Post-op Assessment: Report given to RN and Post -op Vital signs reviewed and stable  Post vital signs: Reviewed and stable  Last Vitals:  Vitals Value Taken Time  BP 128/60 06/07/20 1409  Temp 36.6 C 06/07/20 1409  Pulse 95 06/07/20 1412  Resp 18 06/07/20 1412  SpO2 97 % 06/07/20 1412  Vitals shown include unvalidated device data.  Last Pain:  Vitals:   06/07/20 1409  TempSrc:   PainSc: 0-No pain         Complications: No complications documented.

## 2020-06-07 NOTE — Op Note (Signed)
Electromagnetic Navigation Bronchoscopy: Cellvizio probe based confocal laser endomicroscopy (pCLE)  Indication: LEFT lung mass/nodule, with FDG avidity, rule out carcinoma.  Preoperative Diagnosis: LEFT lung nodule/mass Post Procedure Diagnosis: LEFT lung nodule/mass Consent: Verbal/Written  Benefits, limitations and potential complications of the procedure were discussed with the patient/family  including, but not limited to bleeding, hemoptysis, respiratory failure requiring intubation and/or prolongued mechanical ventilation, infection, pneumothorax (collapse of lung) requiring chest tube placement, stroke or even death.  Patient agreed to proceed.  Hand washing performed prior to starting the procedure.   Surgeon: Renold Don, MD Assistant/Scrub: Sullivan Lone, RRT Circulator: Liborio Nixon, RRT Anesthesiologist/CRNA Derwood Kaplan, MD/Michael Delice Bison, CRNA ROSE available?:  Yes, [facility]  Type of Anesthesia: General endotracheal  Procedure Performed:  Virtual Bronchoscopy with Multi-planar Image analysis, 3-D reconstruction of coronal, sagittal and multi-planar images for the purposes of planning real-time bronchoscopy using the iLogic Electromagnetic Navigation Bronchoscopy System (superDimension).  Cellvizio probe based confocal laser endomicroscopy (pCLE)  Description of Procedure:The patient was taken to Procedure Room 2 (Bronchoscopy Suite) appropriate timeout was taken.  Patient was placed on the superDimension table.  Patient was then inducted under general anesthesia by the anesthesia team.  She was intubated with an 8.5 ETT without difficulty.  A Portex adapter was placed on the ETT flange.  At this point the Olympus video bronchoscope was advanced through the Portex adapter and anatomic tour of the airway was performed. The visible trachea was normal, carina was sharp, inspection of the right lung showed no abnormalities or endobronchial lesions on the right upper lobe,  right middle lobe and right lower lobe subsegments.  Inspection of the left lung showed no endobronchial lesions, and no mucosal lesions on left upper lobe, lingula and lower lobe subsegments.  At this point the superDimension extended working channel and locatable guide (LG) were advanced through the working channel of the bronchoscope.The catheter/LG combo was then placed into the central portion of the trachea. The LG was directed to standard registration points at the following centers: main carina, right upper lobe bronchus, right lower lobe bronchus, right middle lobe bronchus, left upper lobe bronchus, and the left lower lobe bronchus. This data was transferred to the i-Logic ENB system for real-time bronchoscopy.  Once registration was completed, navigation to the target lesion was begun and the scope was advanced until I could go no further and then the extended working channel/LG combo was navigated to the LEFT lower lobe superior subsegment for tissue sampling.  The position of the LG was confirmed with fluoroscopy.  At this point the LG was removed and the Cellvizio endomicroscope probe was advanced through the extended working channel and abnormal tissue was confirmed.  Once this was performed, the Endomicroscope probe was removed and and transbronchial brushings were performed.  ROSE revealed lesional cells, additional brushings were performed a total of 3 brushes were utilized.  In similar fashion transbronchial biopsies were performed with superDimension biopsy forceps.  During tissue acquisition the position of the catheter was confirmed at various times with the LG probe.  Additional sampling was done with a Gen  Cut tool and after this was completed a targeted bronchoalveolar lavage was performed on the left lower lobe, 40 mL of saline were instilled yielding approximately 5 mL of aliquot.  Once this was completed the airway was examined to ensure adequate hemostasis, the patient then received 9  mL of 1% lidocaine via lavage. Tthe bronchoscope was retrieved and the procedure was terminated. Patient was allowed to emerge from  general anesthesia and was transferred to the PACU in satisfactory condition.  She tolerated the procedure very well.   Specimens Obtained:  Transbronchial TBNA (GEN cut): 4 passes, tool rinse in CytoLyt  Transbronchial Forceps Biopsy x8  Transbronchial Brush: X3  Targeted bronchoalveolar lavage: Aliquot 5 mL in CytoLyt  Fluoroscopy:  Fluoroscopy was utilized during the course of this procedure to assure that biopsies were taken in a safe manner under fluoroscopic guidance with spot films as required.     Complications:None  Postprocedure chest x-ray showed no pneumothorax:   Estimated Blood Loss: minimal approx less than 1 mL  Assessment and Plan/Additional Comments: 1.  LEFT lower lobe mass, rule out CA 2.  Status post successful ENB sampling   We will follow up on pathology reports.  Patient has appointments with oncology and pulmonary post procedure.  Renold Don, MD [facility]   *This note was dictated using voice recognition software/Dragon.  Despite best efforts to proofread, errors can occur which can change the meaning.  Any change was purely unintentional.

## 2020-06-07 NOTE — Anesthesia Procedure Notes (Signed)
Procedure Name: Intubation Date/Time: 06/07/2020 12:58 PM Performed by: Lia Foyer, CRNA Pre-anesthesia Checklist: Patient identified, Emergency Drugs available, Suction available and Patient being monitored Patient Re-evaluated:Patient Re-evaluated prior to induction Oxygen Delivery Method: Circle system utilized Preoxygenation: Pre-oxygenation with 100% oxygen Induction Type: IV induction Ventilation: Mask ventilation without difficulty Laryngoscope Size: McGraph and 4 Grade View: Grade I Tube type: Oral Tube size: 8.5 mm Number of attempts: 1 Airway Equipment and Method: Stylet,  Oral airway and Video-laryngoscopy Placement Confirmation: ETT inserted through vocal cords under direct vision,  positive ETCO2 and breath sounds checked- equal and bilateral Secured at: 20 cm Tube secured with: Tape Dental Injury: Teeth and Oropharynx as per pre-operative assessment

## 2020-06-07 NOTE — Discharge Instructions (Addendum)
Flexible Bronchoscopy, Care After This sheet gives you information about how to care for yourself after your test. Your doctor may also give you more specific instructions. If you have problems or questions, contact your doctor. Follow these instructions at home: Eating and drinking  Do not eat or drink anything (not even water) for 2 hours after your test, or until your numbing medicine (local anesthetic) wears off.  When your numbness is gone and your cough and gag reflexes have come back, you may: ? Eat only soft foods. ? Slowly drink liquids.  The day after the test, go back to your normal diet. Driving  Do not drive for 24 hours if you were given a medicine to help you relax (sedative).  Do not drive or use heavy machinery while taking prescription pain medicine. General instructions   Take over-the-counter and prescription medicines only as told by your doctor.  Return to your normal activities as told. Ask what activities are safe for you.  Do not use any products that have nicotine or tobacco in them. This includes cigarettes and e-cigarettes. If you need help quitting, ask your doctor.  Keep all follow-up visits as told by your doctor. This is important. It is very important if you had a tissue sample (biopsy) taken. Get help right away if:  You have shortness of breath that gets worse.  You get light-headed.  You feel like you are going to pass out (faint).  You have chest pain.  You cough up: ? More than a little blood. ? More blood than before. Summary  Do not eat or drink anything (not even water) for 2 hours after your test, or until your numbing medicine wears off.  Do not use cigarettes. Do not use e-cigarettes.  Get help right away if you have chest pain. This information is not intended to replace advice given to you by your health care provider. Make sure you discuss any questions you have with your health care provider. Document Revised: 05/18/2017  Document Reviewed: 06/23/2016 Elsevier Patient Education  2020 [place]   1) The drugs that you were given will stay in your system until tomorrow so for the next 24 hours you should not:  A) Drive an automobile B) Make any legal decisions C) Drink any alcoholic beverage   2) You may resume regular meals tomorrow.  Today it is better to start with liquids and gradually work up to solid foods.  You may eat anything you prefer, but it is better to start with liquids, then soup and crackers, and gradually work up to solid foods.   3) Please notify your doctor immediately if you have any unusual bleeding, trouble breathing, redness and pain at the surgery site, drainage, fever, or pain not relieved by medication.    4) Additional Instructions:        Please contact your physician with any problems or Same Day Surgery at [phone], Monday through Friday 6 am to 4 pm, or [place] at [facility] number at [phone].

## 2020-06-07 NOTE — Anesthesia Preprocedure Evaluation (Addendum)
Anesthesia Evaluation  Patient identified by MRN, date of birth, ID band Patient awake    Reviewed: Allergy & Precautions, H&P , NPO status , Patient's Chart, lab work & pertinent test results  History of Anesthesia Complications Negative for: history of anesthetic complications  Airway Mallampati: II       Dental  (+) Edentulous Upper   Pulmonary asthma , sleep apnea , COPD, former smoker,  lung mass    + decreased breath sounds      Cardiovascular (-) angina(-) Past MI and (-) Cardiac Stents negative cardio ROS  (-) dysrhythmias  Rhythm:regular Rate:Normal     Neuro/Psych PSYCHIATRIC DISORDERS Depression Bipolar Disorder Schizophrenia CVA    GI/Hepatic negative GI ROS, (+) Hepatitis -, C  Endo/Other  diabetes  Renal/GU      Musculoskeletal   Abdominal   Peds  Hematology negative hematology ROS (+)   Anesthesia Other Findings Past Medical History: No date: Asthma No date: COPD (chronic obstructive pulmonary disease) ([place]) 2014: CVA (cerebral vascular accident) ([place]) No date: Depression No date: Diabetes mellitus ([place]) No date: Diabetes mellitus without complication ([facility]) No date: Hepatitis C No date: History of kidney stones No date: Hyperlipidemia No date: Iron deficiency anemia No date: Obesity No date: OSA on CPAP     Comment:  Mild, noncompliant with CPAP said cpap machine was               recalled, is waiting for a replacement  No date: Paranoia [facility]) No date: Schizoaffective disorder, bipolar type ([place])  Past Surgical History: 2013: BREAST CYST EXCISION; Bilateral     Comment:  Benign No date: CESAREAN SECTION No date: CHOLECYSTECTOMY No date: COLONOSCOPY 2020: ESOPHAGOGASTRODUODENOSCOPY     Comment:  done in [place] 2002: INGUINAL HERNIA REPAIR; Left No date: UMBILICAL HERNIA REPAIR     Reproductive/Obstetrics negative OB ROS                            Anesthesia  Physical Anesthesia Plan  ASA: III  Anesthesia Plan: General ETT   Post-op Pain Management:    Induction:   PONV Risk Score and Plan: Ondansetron, Dexamethasone, Midazolam and Treatment may vary due to age or medical condition  Airway Management Planned:   Additional Equipment:   Intra-op Plan:   Post-operative Plan:   Informed Consent: I have reviewed the patients History and Physical, chart, labs and discussed the procedure including the risks, benefits and alternatives for the proposed anesthesia with the patient or authorized representative who has indicated his/her understanding and acceptance.     Dental Advisory Given  Plan Discussed with: Anesthesiologist, CRNA and Surgeon  Anesthesia Plan Comments:        Anesthesia Quick Evaluation

## 2020-06-08 ENCOUNTER — Encounter: Payer: Self-pay | Admitting: Pulmonary Disease

## 2020-06-08 NOTE — Anesthesia Postprocedure Evaluation (Signed)
Anesthesia Post Note  Patient: Natalie [name]  Procedure(s) Performed: VIDEO BRONCHOSCOPY WITH ENDOBRONCHIAL NAVIGATION (N/A )  Patient location during evaluation: PACU Anesthesia Type: General Level of consciousness: awake and alert Pain management: pain level controlled Vital Signs Assessment: post-procedure vital signs reviewed and stable Respiratory status: spontaneous breathing, nonlabored ventilation and respiratory function stable Cardiovascular status: blood pressure returned to baseline and stable Postop Assessment: no apparent nausea or vomiting Anesthetic complications: no   No complications documented.   Last Vitals:  Vitals:   06/07/20 1532 06/07/20 1539  BP: 139/73 137/60  Pulse: 93 91  Resp: 16 18  Temp: (!) 36.3 C 36.6 C  SpO2: 90% 91%    Last Pain:  Vitals:   06/08/20 0832  TempSrc:   PainSc: 0-No pain                 Tera Mater

## 2020-06-09 LAB — CYTOLOGY - NON PAP

## 2020-06-09 LAB — SURGICAL PATHOLOGY

## 2020-06-13 NOTE — Progress Notes (Signed)
[place]  [phone] [phone]  ID: Natalie [name] OB: Oct 15, 1960  MR#: [id number]  YTK#:[id number]  Patient Care Team: [facility], [facility] as PCP - [facility] [place], [place], RN as Oncology Nurse Navigator  CHIEF COMPLAINT: Stage Ib non-small cell carcinoma, favor adenocarcinoma, of the left lower lobe lung.  INTERVAL HISTORY: Patient returns to clinic today for further evaluation, discussion of her biopsy results, and treatment planning.  She is anxious, but otherwise feels well.  She has no neurologic complaints.  She denies any recent fevers or illnesses.  She has a good appetite and denies weight loss.  She has no chest pain, shortness of breath, cough, or hemoptysis.  She denies any nausea, vomiting, constipation, or diarrhea.  She has no urinary complaints.  Patient offers no specific complaints today.  REVIEW OF SYSTEMS:   Review of Systems  Constitutional: Negative.  Negative for fever, malaise/fatigue and weight loss.  Respiratory: Negative.  Negative for cough, hemoptysis and shortness of breath.   Cardiovascular: Negative.  Negative for chest pain and leg swelling.  Gastrointestinal: Negative.  Negative for abdominal pain.  Genitourinary: Negative.  Negative for dysuria.  Musculoskeletal: Negative.  Negative for back pain and joint pain.  Skin: Negative.  Negative for rash.  Neurological: Negative.  Negative for dizziness, focal weakness, weakness and headaches.  Psychiatric/Behavioral: Negative.  The patient is not nervous/anxious.     As per HPI. Otherwise, a complete review of systems is negative.  PAST MEDICAL HISTORY: Past Medical History:  Diagnosis Date  . Asthma   . COPD (chronic obstructive pulmonary disease) ([place])   . CVA (cerebral vascular accident) ([place]) 2014  . Depression   . Diabetes mellitus ([place])   . Diabetes mellitus without complication ([place])   . Hepatitis C   . History of kidney stones    . Hyperlipidemia   . Iron deficiency anemia   . Obesity   . OSA on CPAP    Mild, noncompliant with CPAP said cpap machine was recalled, is waiting for a replacement   . Paranoia ([place])   . Schizoaffective disorder, bipolar type ([place])     PAST SURGICAL HISTORY: Past Surgical History:  Procedure Laterality Date  . BREAST CYST EXCISION Bilateral 2013   Benign  . CESAREAN SECTION    . CHOLECYSTECTOMY    . COLONOSCOPY    . ESOPHAGOGASTRODUODENOSCOPY  2020   done in [place]  . INGUINAL HERNIA REPAIR Left 2002  . UMBILICAL HERNIA REPAIR    . VIDEO BRONCHOSCOPY WITH ENDOBRONCHIAL NAVIGATION N/A 06/07/2020   Procedure: VIDEO BRONCHOSCOPY WITH ENDOBRONCHIAL NAVIGATION;  Surgeon: Tyler Pita, MD;  Location: [facility] ORS;  Service: Pulmonary;  Laterality: N/A;    FAMILY HISTORY: History reviewed. No pertinent family history.  ADVANCED DIRECTIVES (Y/N):  N  HEALTH MAINTENANCE: Social History   Tobacco Use  . Smoking status: Former Smoker    Packs/day: 2.00    Years: 38.00    Pack years: 76.00    Types: Cigarettes    Quit date: 2012    Years since quitting: 10.0  . Smokeless tobacco: Former Network engineer  . Vaping Use: Never used  Substance Use Topics  . Alcohol use: Yes    Comment: occassionaly  . Drug use: Not Currently     Colonoscopy:  PAP:  Bone density:  Lipid panel:  No Known Allergies  Current Outpatient Medications  Medication Sig Dispense Refill  . acetaminophen (TYLENOL) 650 MG CR tablet Take 650 mg by  mouth 2 (two) times daily.    Marland Kitchen albuterol (VENTOLIN HFA) 108 (90 Base) MCG/ACT inhaler Inhale 2 puffs into the lungs every 6 (six) hours as needed for wheezing or shortness of breath.    Marland Kitchen atorvastatin (LIPITOR) 40 MG tablet Take 40 mg by mouth at bedtime.    . benztropine (COGENTIN) 1 MG tablet Take 1 tablet by mouth at bedtime.    . citalopram (CELEXA) 20 MG tablet Take 20 mg by mouth every morning.    . Dulaglutide (TRULICITY) 1.5 UE/4.5WU SOPN Inject  1.5 mg into the skin every Thursday.    . Fluticasone-Umeclidin-Vilant (TRELEGY ELLIPTA) 100-62.5-25 MCG/INH AEPB Inhale into the lungs.    . gabapentin (NEURONTIN) 300 MG capsule Take 300 mg by mouth 2 (two) times daily.    . insulin aspart (NOVOLOG) 100 UNIT/ML FlexPen Inject 9 Units into the skin 3 (three) times daily with meals. (Patient taking differently: Inject 4 Units into the skin 3 (three) times daily with meals.) 15 mL 11  . insulin detemir (LEVEMIR) 100 UNIT/ML injection Inject 15 Units into the skin 2 (two) times daily.    Marland Kitchen latanoprost (XALATAN) 0.005 % ophthalmic solution Place 1 drop into both eyes daily.    Marland Kitchen lidocaine (LIDODERM) 5 % Place 2 patches onto the skin See admin instructions. Apply 1 patch to each foot every 12 hours    . LORazepam (ATIVAN) 1 MG tablet Take 1 mg by mouth 2 (two) times daily.    . ziprasidone (GEODON) 80 MG capsule Take 80 mg by mouth at bedtime.     No current facility-administered medications for this visit.    OBJECTIVE: Vitals:   06/17/20 1013  BP: (!) 167/82  Pulse: (!) 103  Temp: 98.8 F (37.1 C)  SpO2: 97%     Body mass index is 43.09 kg/m.    ECOG FS:0 - Asymptomatic  General: Well-developed, well-nourished, no acute distress. Eyes: Pink conjunctiva, anicteric sclera. HEENT: Normocephalic, moist mucous membranes. Lungs: No audible wheezing or coughing. Heart: Regular rate and rhythm. Abdomen: Soft, nontender, no obvious distention. Musculoskeletal: No edema, cyanosis, or clubbing. Neuro: Alert, answering all questions appropriately. Cranial nerves grossly intact. Skin: No rashes or petechiae noted. Psych: Normal affect.   LAB RESULTS:  Lab Results  Component Value Date   NA 139 06/03/2020   K 3.6 06/03/2020   CL 99 06/03/2020   CO2 26 06/03/2020   GLUCOSE 136 (H) 06/03/2020   BUN 14 06/03/2020   CREATININE 0.65 06/03/2020   CALCIUM 9.8 06/03/2020   PROT 7.6 01/20/2020   ALBUMIN 2.6 (L) 01/26/2020   AST 26 01/20/2020    ALT 23 01/20/2020   ALKPHOS 89 01/20/2020   BILITOT 2.5 (H) 01/20/2020   GFRNONAA >60 06/03/2020   GFRAA >60 01/30/2020    Lab Results  Component Value Date   WBC 11.8 (H) 05/19/2020   HGB 12.7 05/19/2020   HCT 38.1 05/19/2020   MCV 90.5 05/19/2020   PLT 208 05/19/2020     STUDIES: [place] Intraoperative  Result Date: 05/19/2020 INDICATION: Concern for metastatic lung cancer. Please perform image guided biopsy of hypermetabolic soft tissue mass adjacent to the right ilium for tissue diagnostic purposes. EXAM: ULTRASOUND AND CT-GUIDED BIOPSY OF INDETERMINATE HYPERMETABOLIC MASS ADJACENT TO THE RIGHT ILIUM. COMPARISON:  PET-CT - 05/06/2020 MEDICATIONS: None. ANESTHESIA/SEDATION: Fentanyl 100 mcg IV; Versed 2 mg IV Sedation time: 19 minutes; The patient was continuously monitored during the procedure by the interventional radiology nurse under my direct supervision. CONTRAST:  None. COMPLICATIONS: None immediate. PROCEDURE: Informed consent was obtained from the patient following an explanation of the procedure, risks, benefits and alternatives. A time out was performed prior to the initiation of the procedure. The patient was positioned supine on the CT table and a limited CT was performed for procedural planning demonstrating unchanged size and appearance of the spiculated at least 5.1 x 4.3 cm mass within the soft tissues adjacent to superior anterior aspect of the right ilium (image 23, series 3). The table position was marked and the lesion was identified sonographically. The procedure was planned. The operative site was prepped and draped in the usual sterile fashion. Under direct ultrasound guidance, a 17 gauge coaxial needle was advanced into the peripheral aspect of the mass spiculated soft tissue mass after lying soft tissues were anesthetized 1% lidocaine with epinephrine. An ultrasound image was saved procedural documentation purposes. Appropriate position was confirmed with CT imaging.  Next, 8 core needle biopsies were obtained of the mass within 18 gauge core needle biopsy device under direct ultrasound guidance. Note, multiple samples were attempted to be obtained utilizing 3 different biopsy devices given overall scant tissue acquisition due to the fibrous nature of the mass. The co-axial needle was removed and superficial hemostasis was achieved with manual compression. A dressing was placed. The patient tolerated the procedure well without immediate postprocedural complication. IMPRESSION: Technically successful ultrasound and CT guided core needle biopsy of hypermetabolic spiculated soft tissue mass adjacent to the anterior superior aspect of the right ilium. Note, multiple samples were attempted to be obtained utilizing 3 different biopsy devices given overall scant tissue acquisition due to the fibrous nature of the mass. Electronically Signed   By: Sandi Mariscal M.D.   On: 05/19/2020 12:58   CT BIOPSY  Result Date: 05/19/2020 INDICATION: Concern for metastatic lung cancer. Please perform image guided biopsy of hypermetabolic soft tissue mass adjacent to the right ilium for tissue diagnostic purposes. EXAM: ULTRASOUND AND CT-GUIDED BIOPSY OF INDETERMINATE HYPERMETABOLIC MASS ADJACENT TO THE RIGHT ILIUM. COMPARISON:  PET-CT - 05/06/2020 MEDICATIONS: None. ANESTHESIA/SEDATION: Fentanyl 100 mcg IV; Versed 2 mg IV Sedation time: 19 minutes; The patient was continuously monitored during the procedure by the interventional radiology nurse under my direct supervision. CONTRAST:  None. COMPLICATIONS: None immediate. PROCEDURE: Informed consent was obtained from the patient following an explanation of the procedure, risks, benefits and alternatives. A time out was performed prior to the initiation of the procedure. The patient was positioned supine on the CT table and a limited CT was performed for procedural planning demonstrating unchanged size and appearance of the spiculated at least 5.1 x  4.3 cm mass within the soft tissues adjacent to superior anterior aspect of the right ilium (image 23, series 3). The table position was marked and the lesion was identified sonographically. The procedure was planned. The operative site was prepped and draped in the usual sterile fashion. Under direct ultrasound guidance, a 17 gauge coaxial needle was advanced into the peripheral aspect of the mass spiculated soft tissue mass after lying soft tissues were anesthetized 1% lidocaine with epinephrine. An ultrasound image was saved procedural documentation purposes. Appropriate position was confirmed with CT imaging. Next, 8 core needle biopsies were obtained of the mass within 18 gauge core needle biopsy device under direct ultrasound guidance. Note, multiple samples were attempted to be obtained utilizing 3 different biopsy devices given overall scant tissue acquisition due to the fibrous nature of the mass. The co-axial needle was removed and superficial hemostasis was  achieved with manual compression. A dressing was placed. The patient tolerated the procedure well without immediate postprocedural complication. IMPRESSION: Technically successful ultrasound and CT guided core needle biopsy of hypermetabolic spiculated soft tissue mass adjacent to the anterior superior aspect of the right ilium. Note, multiple samples were attempted to be obtained utilizing 3 different biopsy devices given overall scant tissue acquisition due to the fibrous nature of the mass. Electronically Signed   By: Sandi Mariscal M.D.   On: 05/19/2020 12:58   DG Chest Port 1 View  Result Date: 06/07/2020 CLINICAL DATA:  Status post biopsy EXAM: PORTABLE CHEST 1 VIEW COMPARISON:  PET-CT May 06, 2020 FINDINGS: No evident pneumothorax. The mass in the left lower lobe seen on previous PET-CT examination is not well-defined currently. There is ill-defined opacity in this area of the left lower lobe. Lungs elsewhere are clear. Heart is upper  normal in size with pulmonary vascularity normal. No adenopathy. No bone lesions. IMPRESSION: No pneumothorax. Ill-defined opacity in area of previous mass left lower lobe, much better seen on CT. Question residual hemorrhage in this area from recent biopsy. Lungs elsewhere clear.  Heart upper normal in size. Electronically Signed   By: Lowella Grip III M.D.   On: 06/07/2020 14:35   DG C-Arm 1-60 Min-No Report  Result Date: 06/07/2020 Fluoroscopy was utilized by the requesting physician.  No radiographic interpretation.    ASSESSMENT: Stage Ib non-small cell carcinoma, favor adenocarcinoma, of the left lower lobe lung.  PLAN:    1. Stage Ib non-small cell carcinoma, favor adenocarcinoma, of the left lower lobe lung: PET scan results from May 06, 2020 reviewed independently with hypermetabolic left lower lobe lung mass and biopsy confirmed malignancy.  Patient also noted to have a right iliac bony hypermetabolism as well as a soft tissue mass adjacent suspicious for isolated metastasis.  Soft tissue mass was biopsied on May 19, 2020 which was negative for malignancy.  This lesion is still suspicious, so therefore we will continue to monitor closely.  Given her stage I disease, patient has elected to pursue surgery and a referral was given to thoracic surgery today.  She will also require PFTs which have been ordered.  No follow-up has been scheduled at this time.  Patient will return to clinic postoperatively for further evaluation.    I spent a total of 30 minutes reviewing chart data, face-to-face evaluation with the patient, counseling and coordination of care as detailed above.   Patient expressed understanding and was in agreement with this plan. She also understands that She can call clinic at any time with any questions, concerns, or complaints.   Cancer Staging Primary adenocarcinoma of lower lobe of left lung [facility]) Staging form: Lung, AJCC 8th Edition - Clinical stage from  06/13/2020: Stage IB (cT2a, cN0, cM0) - Signed by Lloyd Huger, MD on 06/13/2020   Lloyd Huger, MD   06/18/2020 7:54 AM

## 2020-06-17 ENCOUNTER — Other Ambulatory Visit: Payer: Self-pay

## 2020-06-17 ENCOUNTER — Telehealth: Payer: Self-pay | Admitting: *Deleted

## 2020-06-17 ENCOUNTER — Inpatient Hospital Stay (HOSPITAL_BASED_OUTPATIENT_CLINIC_OR_DEPARTMENT_OTHER): Payer: Medicaid Other | Admitting: Oncology

## 2020-06-17 ENCOUNTER — Encounter: Payer: Self-pay | Admitting: Oncology

## 2020-06-17 VITALS — BP 167/82 | HR 103 | Temp 98.8°F | Wt 235.6 lb

## 2020-06-17 DIAGNOSIS — R918 Other nonspecific abnormal finding of lung field: Secondary | ICD-10-CM | POA: Diagnosis not present

## 2020-06-17 NOTE — Progress Notes (Signed)
Patient denies any concerns today.  

## 2020-06-17 NOTE — Telephone Encounter (Signed)
Patient notified of pre-procedure covid testing that is scheduled for Monday 1/3, patient to get tested by 12:00 to be sure patients results are back prior to PFT's on 1/4. Patient verbalized understanding of appt details.

## 2020-06-21 ENCOUNTER — Other Ambulatory Visit
Admission: RE | Admit: 2020-06-21 | Discharge: 2020-06-21 | Disposition: A | Discharge status: Home / Self Care | Payer: Medicaid Other | Source: Ambulatory Visit | Attending: Oncology | Admitting: Oncology

## 2020-06-21 ENCOUNTER — Other Ambulatory Visit: Payer: Self-pay

## 2020-06-21 DIAGNOSIS — Z20822 Contact with and (suspected) exposure to covid-19: Secondary | ICD-10-CM | POA: Diagnosis not present

## 2020-06-21 DIAGNOSIS — Z01812 Encounter for preprocedural laboratory examination: Secondary | ICD-10-CM | POA: Diagnosis present

## 2020-06-22 ENCOUNTER — Ambulatory Visit: Payer: Medicaid Other | Attending: Oncology

## 2020-06-22 DIAGNOSIS — R918 Other nonspecific abnormal finding of lung field: Secondary | ICD-10-CM | POA: Diagnosis not present

## 2020-06-22 DIAGNOSIS — J988 Other specified respiratory disorders: Secondary | ICD-10-CM | POA: Insufficient documentation

## 2020-06-22 LAB — SARS CORONAVIRUS 2 (TAT 6-24 HRS): SARS Coronavirus 2: NEGATIVE

## 2020-06-22 MED ORDER — ALBUTEROL SULFATE (2.5 MG/3ML) 0.083% IN NEBU
2.5000 mg | INHALATION_SOLUTION | Freq: Once | RESPIRATORY_TRACT | Status: AC
  Administered 2020-06-22: 2.5 mg via RESPIRATORY_TRACT
  Filled 2020-06-22: qty 3

## 2020-06-24 LAB — PULMONARY FUNCTION TEST ARMC ONLY
DL/VA % pred: 103 %
DL/VA: 4.43 ml/min/mmHg/L
DLCO unc % pred: 93 %
DLCO unc: 17.61 ml/min/mmHg
FEF 25-75 Post: 1.25 L/sec
FEF 25-75 Pre: 0.95 L/sec
FEF2575-%Change-Post: 32 %
FEF2575-%Pred-Post: 66 %
FEF2575-%Pred-Pre: 49 %
FEV1-%Change-Post: 5 %
FEV1-%Pred-Post: 75 %
FEV1-%Pred-Pre: 71 %
FEV1-Post: 1.43 L
FEV1-Pre: 1.35 L
FEV1FVC-%Change-Post: -6 %
FEV1FVC-%Pred-Pre: 84 %
FEV6-%Change-Post: 13 %
FEV6-%Pred-Post: 98 %
FEV6-%Pred-Pre: 86 %
FEV6-Post: 2.29 L
FEV6-Pre: 2.02 L
FEV6FVC-%Change-Post: 0 %
FEV6FVC-%Pred-Post: 103 %
FEV6FVC-%Pred-Pre: 103 %
FVC-%Change-Post: 13 %
FVC-%Pred-Post: 95 %
FVC-%Pred-Pre: 83 %
FVC-Post: 2.3 L
Post FEV1/FVC ratio: 62 %
Post FEV6/FVC ratio: 100 %
Pre FEV1/FVC ratio: 67 %
Pre FEV6/FVC Ratio: 100 %
RV % pred: 87 %
RV: 1.68 L
TLC % pred: 81 %
TLC: 3.89 L

## 2020-06-28 ENCOUNTER — Encounter: Payer: Self-pay | Admitting: Thoracic Surgery (Cardiothoracic Vascular Surgery)

## 2020-06-28 ENCOUNTER — Other Ambulatory Visit: Payer: Self-pay

## 2020-06-28 ENCOUNTER — Other Ambulatory Visit: Payer: Self-pay | Admitting: *Deleted

## 2020-06-28 ENCOUNTER — Institutional Professional Consult (permissible substitution) (INDEPENDENT_AMBULATORY_CARE_PROVIDER_SITE_OTHER): Payer: Medicaid Other | Admitting: Thoracic Surgery (Cardiothoracic Vascular Surgery)

## 2020-06-28 VITALS — BP 146/83 | HR 113 | Resp 20 | Ht 62.0 in | Wt 238.0 lb

## 2020-06-28 DIAGNOSIS — R918 Other nonspecific abnormal finding of lung field: Secondary | ICD-10-CM

## 2020-06-28 DIAGNOSIS — C3492 Malignant neoplasm of unspecified part of left bronchus or lung: Secondary | ICD-10-CM

## 2020-06-28 NOTE — Progress Notes (Signed)
PCP is [facility] Referring Provider is Grayland Ormond, Natalie November, Natalie [name]  Chief Complaint  Patient presents with  . 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  Natalie [name] is a 60 year old former smoker with a history of tobacco abuse, asthma, COPD, CVA, insulin-dependent diabetes complicated by neuropathy, hepatitis C, hyperlipidemia, obesity, sleep apnea, and schizoaffective disorder.  She smoked about 2 packs of cigarettes daily before quitting in 2012.  Back in the fall she was admitted to the hospital with a diabetic coma.  She was treated for severe DKA in the ICU.  She ultimately was discharged after about 10 days.  She was noted to have a shadow in the left lung on her chest x-ray.  She had a CT on 05/03/2020 which showed a 3.1 x 2.1 cm left lower lobe lung mass.  There was some other lung densities as well.  There was no adenopathy.  She had a PET/CT which showed the mass was hypermetabolic.  There also was some hypermetabolic activity in the right iliac.  A CT-guided biopsy of the right iliac showed no malignancy.  She had a navigational bronchoscopy by Dr. Patsey Berthold.  Biopsies of the left lower lobe lung mass showed non-small cell carcinoma.  She quit smoking in 2012.  She has not had any change in appetite but has gained significant amount of weight over the past 3 months.  She attributes this to having to eat 3 times a day with her insulin.  She walks with a cane due to peripheral neuropathy.  She also is on Neurontin for that.  She does get short of breath with activity.  She denies any chest pain, pressure, or tightness.  Zubrod Score: At the time of surgery this patient's most appropriate activity status/level should be described as: []     0    Normal activity, no symptoms [x]     1    Restricted in  physical strenuous activity but ambulatory, able to do out light work []     2    Ambulatory and capable of self care, unable to do work activities, up and about >50 % of waking hours                              []     3    Only limited self care, in bed greater than 50% of waking hours []     4    Completely disabled, no self care, confined to bed or chair []     5    Moribund    Past Medical History:  Diagnosis Date  . Asthma   . COPD (chronic obstructive pulmonary disease) ([place])   . CVA (cerebral vascular accident) ([place]) 2014  . Depression   . Diabetes mellitus ([place])   . Diabetes mellitus without complication ([place])   . Hepatitis C   . History of kidney stones   . Hyperlipidemia   . Iron deficiency anemia   . Obesity   . OSA on CPAP    Mild, noncompliant with CPAP said cpap machine was recalled, is waiting for a replacement   . Paranoia ([place])   . Schizoaffective disorder, bipolar type [facility])     Past Surgical History:  Procedure Laterality Date  . BREAST CYST  EXCISION Bilateral 2013   Benign  . CESAREAN SECTION    . CHOLECYSTECTOMY    . COLONOSCOPY    . ESOPHAGOGASTRODUODENOSCOPY  2020   done in [place]  . INGUINAL HERNIA REPAIR Left 2002  . UMBILICAL HERNIA REPAIR    . VIDEO BRONCHOSCOPY WITH ENDOBRONCHIAL NAVIGATION N/A 06/07/2020   Procedure: VIDEO BRONCHOSCOPY WITH ENDOBRONCHIAL NAVIGATION;  Surgeon: Natalie Pita, Natalie [name];  Location: [facility] ORS;  Service: Pulmonary;  Laterality: N/A;    No family history on file.  Social History Social History   Tobacco Use  . Smoking status: Former Smoker    Packs/day: 2.00    Years: 38.00    Pack years: 76.00    Types: Cigarettes    Quit date: 2012    Years since quitting: 10.0  . Smokeless tobacco: Former Network engineer  . Vaping Use: Never used  Substance Use Topics  . Alcohol use: Yes    Comment: occassionaly  . Drug use: Not Currently    Current Outpatient Medications  Medication Sig Dispense Refill  .  acetaminophen (TYLENOL) 650 MG CR tablet Take 650 mg by mouth 2 (two) times daily.    Marland Kitchen albuterol (VENTOLIN HFA) 108 (90 Base) MCG/ACT inhaler Inhale 2 puffs into the lungs every 6 (six) hours as needed for wheezing or shortness of breath.    Marland Kitchen atorvastatin (LIPITOR) 40 MG tablet Take 40 mg by mouth at bedtime.    . benztropine (COGENTIN) 1 MG tablet Take 1 tablet by mouth at bedtime.    . citalopram (CELEXA) 20 MG tablet Take 20 mg by mouth every morning.    . Dulaglutide (TRULICITY) 1.5 JJ/8.8CZ SOPN Inject 1.5 mg into the skin every Thursday.    . Fluticasone-Umeclidin-Vilant (TRELEGY ELLIPTA) 100-62.5-25 MCG/INH AEPB Inhale into the lungs.    . gabapentin (NEURONTIN) 300 MG capsule Take 300 mg by mouth 2 (two) times daily.    . insulin aspart (NOVOLOG) 100 UNIT/ML FlexPen Inject 9 Units into the skin 3 (three) times daily with meals. (Patient taking differently: Inject 4 Units into the skin 3 (three) times daily with meals.) 15 mL 11  . insulin detemir (LEVEMIR) 100 UNIT/ML injection Inject 15 Units into the skin 2 (two) times daily.    Marland Kitchen latanoprost (XALATAN) 0.005 % ophthalmic solution Place 1 drop into both eyes daily.    Marland Kitchen lidocaine (LIDODERM) 5 % Place 2 patches onto the skin See admin instructions. Apply 1 patch to each foot every 12 hours    . LORazepam (ATIVAN) 1 MG tablet Take 1 mg by mouth 2 (two) times daily.    . ziprasidone (GEODON) 80 MG capsule Take 80 mg by mouth at bedtime.     No current facility-administered medications for this visit.    No Known Allergies  Review of Systems  Constitutional: Positive for activity change and unexpected weight change. Negative for fatigue.  HENT: Negative for trouble swallowing and voice change.   Eyes: Positive for visual disturbance (blurry).  Respiratory: Positive for apnea, cough, shortness of breath and wheezing (seldom uses albuterol).   Cardiovascular: Negative for chest pain, palpitations and leg swelling.  Genitourinary:  Negative for difficulty urinating and dysuria.  Musculoskeletal: Positive for gait problem.  Neurological: Negative for seizures, syncope and weakness.       Mini stroke in 2014.  Peripheral neuropathy pain and paresthesias in both feet  Psychiatric/Behavioral: Positive for dysphoric mood. The patient is nervous/anxious.   All other systems reviewed and are negative.  BP (!) 146/83 (BP Location: Right Arm, Patient Position: Sitting)   Pulse (!) 113   Resp 20   Ht 5\' 2"  (1.575 m)   Wt 238 lb (108 kg)   SpO2 94% Comment: RA with mask on  BMI 43.53 kg/m  Physical Exam Vitals reviewed.  Constitutional:      General: She is not in acute distress.    Appearance: She is obese.  HENT:     Head: Normocephalic and atraumatic.  Eyes:     General: No scleral icterus.    Extraocular Movements: Extraocular movements intact.  Cardiovascular:     Rate and Rhythm: Regular rhythm. Tachycardia present.     Heart sounds: Normal heart sounds. No murmur heard. No friction rub. No gallop.   Pulmonary:     Effort: Pulmonary effort is normal. No respiratory distress.     Breath sounds: Normal breath sounds. No wheezing or rales.  Abdominal:     General: There is no distension.     Palpations: Abdomen is soft.     Tenderness: There is no abdominal tenderness.  Musculoskeletal:     Cervical back: Neck supple.  Lymphadenopathy:     Cervical: No cervical adenopathy.  Skin:    General: Skin is warm and dry.  Neurological:     General: No focal deficit present.     Mental Status: She is alert and oriented to person, place, and time.     Cranial Nerves: No cranial nerve deficit.     Gait: Gait abnormal (Walks with cane).    Diagnostic Tests: CT CHEST WITHOUT CONTRAST  TECHNIQUE: Multidetector CT imaging of the chest was performed following the standard protocol without IV contrast.  COMPARISON:  Chest radiograph 01/26/2020  FINDINGS: Cardiovascular: Abdominal aorta is normal caliber  with atherosclerotic calcification. There is no retroperitoneal or periportal lymphadenopathy. No pelvic lymphadenopathy.  Mediastinum/Nodes: No axillary or supraclavicular adenopathy. No mediastinal or hilar adenopathy. No pericardial fluid. Esophagus normal.  Lungs/Pleura: In the superior segment of the LEFT lower lobe lobular mass measures 3.1 by 2.1 cm (image 71/CT series 3).  Within the LEFT lower lobe 11 mm nodule (image 101/series 3). Additional 4 mm pulmonary nodule in the LEFT lower lobe on image 84).  Angular density in the superior aspect of the LEFT upper lobe measures 14 mm.  There is small centrilobular ground-glass nodules within the upper lobes and lower lobes.  The several larger ground-glass nodules are present within the lower lobes. For example 9 mm ground-glass nodule in the RIGHT lower lobe image 87/series 3. 6 mm ground-glass nodule on image 64 in the RIGHT lower lobe. Similar rounded ground-glass nodules in the LEFT lower lobe  Upper Abdomen: Limited view of the liver, kidneys, pancreas are unremarkable. Normal adrenal glands.  Musculoskeletal: No aggressive osseous lesion.  IMPRESSION: 1. Lobular mass in the LEFT lower lobe is most concerning for bronchogenic carcinoma. Recommend FDG PET scan for staging and biopsy planning. 2. Two pulmonary nodules in the LEFT lower lobe are concerning for synchronous bronchogenic carcinoma versus metastasis. 3. No mediastinal lymphadenopathy identified on noncontrast exam. 4. Bilateral scattered ground-glass nodules which are small in the upper lobes and larger and more defined in the lower lobes. Differential includes inflammatory / infectious process versus multifocal neoplastic process.   Electronically Signed   By: Natalie Bouchard M.D.   On: 05/03/2020 09:18 NUCLEAR MEDICINE PET SKULL BASE TO THIGH  TECHNIQUE: 10.9 mCi F-18 FDG was injected intravenously. Full-ring PET imaging was  performed from  the skull base to thigh after the radiotracer. CT data was obtained and used for attenuation correction and anatomic localization.  Fasting blood glucose: 138 mg/dl  COMPARISON:  05/03/2020 chest CT  FINDINGS: Mediastinal blood pool activity: SUV max 2.5  Liver activity: SUV max NA  NECK: No areas of abnormal hypermetabolism.  Incidental CT findings: No cervical adenopathy.  CHEST: No thoracic nodal hypermetabolism. The left lower lobe lobulated lung mass is hypermetabolic at 3.1 x 1.9 cm and a S.U.V. max of 9.9 on 99/3.  Smaller nodules are primarily below PET resolution. A 1.0 cm left lower lobe nodule on 114/3 is not hypermetabolic, at the low end of PET resolution.  Incidental CT findings: Deferred to recent diagnostic CT. Coronary and aortic atherosclerosis with tiny hiatal hernia.  ABDOMEN/PELVIS: No abdominopelvic parenchymal or nodal hypermetabolism.  Incidental CT findings: Cholecystectomy. Normal adrenal glands. Abdominal aortic atherosclerosis. Abdominal wall laxity containing nonobstructive bowel loops. Globular enlarged uterus is likely related to underlying fibroids.  A left pelvic, presumably ovarian 5.2 x 5.9 cm lesion demonstrates fat and ossific densities, consistent with a dermoid.  SKELETON: Soft tissue density subcutaneous or intramuscular mass just cephalad to the right iliac crest measures 4.9x4.1 cm and a S.U.V. max of 2.7 on 183/3.  Separate more posterior right iliac focus of hypermetabolism may correspond to vague increased density. Example at a S.U.V. max of 6.6 on 196/3.  Incidental CT findings: Peripherally sclerotic left iliac 1.2 cm lesion on 201/3 has indolent characteristics.  IMPRESSION: 1. Hypermetabolic left lower lobe lung mass, consistent with primary bronchogenic carcinoma. 2. Right iliac hypermetabolism, highly suspicious for isolated osseous metastasis. 3. Soft tissue mass just cephalad to  the right iliac crest is nonspecific but most likely represents an intramuscular metastasis. 4. Smaller nonspecific pulmonary nodules, primarily below PET resolution. 5. Incidental findings, including: Coronary artery atherosclerosis. Aortic Atherosclerosis (ICD10-I70.0). Fibroid uterus. Left ovarian dermoid.   Electronically Signed   By: Natalie Miyamoto M.D.   On: 05/06/2020 12:23  I have reviewed the images from the CT and PET/CT.  Dominant 3 cm left lower lobe lung mass that is hypermetabolic.  Other ill-defined lung lesions.  Iliac hypermetabolism on PET.  Pulmonary function testing FVC 2.02 (83%) FEV1 1.35 (71%) FEV1 1.43 (75%) postbronchodilator DLCO 17.61 (93%)  Impression: Natalie [name] is a 60 year old former smoker with a history of tobacco abuse, asthma, COPD, CVA, insulin-dependent diabetes complicated by neuropathy, hepatitis C, hyperlipidemia, obesity, sleep apnea, and schizoaffective disorder.  She has a 75-pack-year history of smoking prior to quitting in 2012.  She was hospitalized in the fall 2021 with diabetic ketoacidosis.  During that admission she had a shadow noted on chest x-ray.  That was followed up with a CT in [name] which showed a 3.1 x 2.1 cm left lower lobe lung mass.  There was no mediastinal or hilar adenopathy.  She then had a PET/CT which showed the lung mass was hypermetabolic.  There was a question of bone metastasis with an area of hypermetabolism in the right iliac.  Needle biopsy of the bone lesion showed no evidence of malignancy.  Bronchoscopic biopsy of the lung mass showed non-small cell carcinoma.  Clinical stage is T2, N0, stage Ib.  Given the negative needle biopsy of the bone lesion, I think we should give her the benefit of the doubt that that is not evidence of metastatic disease.  We discussed the potential treatment options of surgery versus radiation.  They both have advantages and disadvantages.  She is aware  of those.  She  understands that surgery is the gold standard.  The appropriate procedure would be a left lower lobectomy.  She does have adequate coronary reserve to tolerate that based on her pulmonary function testing.  I recommended that we proceed with Xi robotic assisted left lower lobectomy.  That would include a lymph node dissection.  We will also plan to do intercostal nerve blocks.  I informed her of the general nature of the procedure including the need for general anesthesia, the incisions to be used, the use of a drainage tube postoperatively, the expected hospital stay, and the overall recovery.  She understands there is no guarantee of a cure.  I informed her of the indications, risks, benefits, and alternatives.  She understands the risks include, but not limited to death, MI, DVT, PE, bleeding, possible need for transfusion, infection, prolonged air leak, cardiac arrhythmias, as well as possibility of other unforeseeable complications.  She accepts the risk and agrees to proceed.  Plan: Xi robotic assisted left lower lobectomy on Monday, 07/12/2020.  Melrose Nakayama, Natalie [name] [facility] [phone]

## 2020-06-28 NOTE — H&P (View-Only) (Signed)
PCP is [facility] Referring Provider is Grayland Ormond, Kathlene November, MD  Chief Complaint  Patient presents with  . Lung Mass    Surgical consult, PFT's  06/22/20, ENB 06/07/20, Pet Scan  05/06/20, Chest CT 05/03/20    HPI: Ms. 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  Natalie [name] is a 60 year old former smoker with a history of tobacco abuse, asthma, COPD, CVA, insulin-dependent diabetes complicated by neuropathy, hepatitis C, hyperlipidemia, obesity, sleep apnea, and schizoaffective disorder.  She smoked about 2 packs of cigarettes daily before quitting in 2012.  Back in the fall she was admitted to the hospital with a diabetic coma.  She was treated for severe DKA in the ICU.  She ultimately was discharged after about 10 days.  She was noted to have a shadow in the left lung on her chest x-ray.  She had a CT on 05/03/2020 which showed a 3.1 x 2.1 cm left lower lobe lung mass.  There was some other lung densities as well.  There was no adenopathy.  She had a PET/CT which showed the mass was hypermetabolic.  There also was some hypermetabolic activity in the right iliac.  A CT-guided biopsy of the right iliac showed no malignancy.  She had a navigational bronchoscopy by Dr. Patsey Berthold.  Biopsies of the left lower lobe lung mass showed non-small cell carcinoma.  She quit smoking in 2012.  She has not had any change in appetite but has gained significant amount of weight over the past 3 months.  She attributes this to having to eat 3 times a day with her insulin.  She walks with a cane due to peripheral neuropathy.  She also is on Neurontin for that.  She does get short of breath with activity.  She denies any chest pain, pressure, or tightness.  Zubrod Score: At the time of surgery this patient's most appropriate activity status/level should be described as: []     0    Normal activity, no symptoms [x]     1    Restricted in  physical strenuous activity but ambulatory, able to do out light work []     2    Ambulatory and capable of self care, unable to do work activities, up and about >50 % of waking hours                              []     3    Only limited self care, in bed greater than 50% of waking hours []     4    Completely disabled, no self care, confined to bed or chair []     5    Moribund    Past Medical History:  Diagnosis Date  . Asthma   . COPD (chronic obstructive pulmonary disease) ([place])   . CVA (cerebral vascular accident) ([place]) 2014  . Depression   . Diabetes mellitus ([place])   . Diabetes mellitus without complication ([place])   . Hepatitis C   . History of kidney stones   . Hyperlipidemia   . Iron deficiency anemia   . Obesity   . OSA on CPAP    Mild, noncompliant with CPAP said cpap machine was recalled, is waiting for a replacement   . Paranoia ([place])   . Schizoaffective disorder, bipolar type [facility])     Past Surgical History:  Procedure Laterality Date  . BREAST CYST  EXCISION Bilateral 2013   Benign  . CESAREAN SECTION    . CHOLECYSTECTOMY    . COLONOSCOPY    . ESOPHAGOGASTRODUODENOSCOPY  2020   done in [place]  . INGUINAL HERNIA REPAIR Left 2002  . UMBILICAL HERNIA REPAIR    . VIDEO BRONCHOSCOPY WITH ENDOBRONCHIAL NAVIGATION N/A 06/07/2020   Procedure: VIDEO BRONCHOSCOPY WITH ENDOBRONCHIAL NAVIGATION;  Surgeon: Tyler Pita, MD;  Location: [facility] ORS;  Service: Pulmonary;  Laterality: N/A;    No family history on file.  Social History Social History   Tobacco Use  . Smoking status: Former Smoker    Packs/day: 2.00    Years: 38.00    Pack years: 76.00    Types: Cigarettes    Quit date: 2012    Years since quitting: 10.0  . Smokeless tobacco: Former Network engineer  . Vaping Use: Never used  Substance Use Topics  . Alcohol use: Yes    Comment: occassionaly  . Drug use: Not Currently    Current Outpatient Medications  Medication Sig Dispense Refill  .  acetaminophen (TYLENOL) 650 MG CR tablet Take 650 mg by mouth 2 (two) times daily.    Marland Kitchen albuterol (VENTOLIN HFA) 108 (90 Base) MCG/ACT inhaler Inhale 2 puffs into the lungs every 6 (six) hours as needed for wheezing or shortness of breath.    Marland Kitchen atorvastatin (LIPITOR) 40 MG tablet Take 40 mg by mouth at bedtime.    . benztropine (COGENTIN) 1 MG tablet Take 1 tablet by mouth at bedtime.    . citalopram (CELEXA) 20 MG tablet Take 20 mg by mouth every morning.    . Dulaglutide (TRULICITY) 1.5 BH/4.1PF SOPN Inject 1.5 mg into the skin every Thursday.    . Fluticasone-Umeclidin-Vilant (TRELEGY ELLIPTA) 100-62.5-25 MCG/INH AEPB Inhale into the lungs.    . gabapentin (NEURONTIN) 300 MG capsule Take 300 mg by mouth 2 (two) times daily.    . insulin aspart (NOVOLOG) 100 UNIT/ML FlexPen Inject 9 Units into the skin 3 (three) times daily with meals. (Patient taking differently: Inject 4 Units into the skin 3 (three) times daily with meals.) 15 mL 11  . insulin detemir (LEVEMIR) 100 UNIT/ML injection Inject 15 Units into the skin 2 (two) times daily.    Marland Kitchen latanoprost (XALATAN) 0.005 % ophthalmic solution Place 1 drop into both eyes daily.    Marland Kitchen lidocaine (LIDODERM) 5 % Place 2 patches onto the skin See admin instructions. Apply 1 patch to each foot every 12 hours    . LORazepam (ATIVAN) 1 MG tablet Take 1 mg by mouth 2 (two) times daily.    . ziprasidone (GEODON) 80 MG capsule Take 80 mg by mouth at bedtime.     No current facility-administered medications for this visit.    No Known Allergies  Review of Systems  Constitutional: Positive for activity change and unexpected weight change. Negative for fatigue.  HENT: Negative for trouble swallowing and voice change.   Eyes: Positive for visual disturbance (blurry).  Respiratory: Positive for apnea, cough, shortness of breath and wheezing (seldom uses albuterol).   Cardiovascular: Negative for chest pain, palpitations and leg swelling.  Genitourinary:  Negative for difficulty urinating and dysuria.  Musculoskeletal: Positive for gait problem.  Neurological: Negative for seizures, syncope and weakness.       Mini stroke in 2014.  Peripheral neuropathy pain and paresthesias in both feet  Psychiatric/Behavioral: Positive for dysphoric mood. The patient is nervous/anxious.   All other systems reviewed and are negative.  BP (!) 146/83 (BP Location: Right Arm, Patient Position: Sitting)   Pulse (!) 113   Resp 20   Ht 5\' 2"  (1.575 m)   Wt 238 lb (108 kg)   SpO2 94% Comment: RA with mask on  BMI 43.53 kg/m  Physical Exam Vitals reviewed.  Constitutional:      General: She is not in acute distress.    Appearance: She is obese.  HENT:     Head: Normocephalic and atraumatic.  Eyes:     General: No scleral icterus.    Extraocular Movements: Extraocular movements intact.  Cardiovascular:     Rate and Rhythm: Regular rhythm. Tachycardia present.     Heart sounds: Normal heart sounds. No murmur heard. No friction rub. No gallop.   Pulmonary:     Effort: Pulmonary effort is normal. No respiratory distress.     Breath sounds: Normal breath sounds. No wheezing or rales.  Abdominal:     General: There is no distension.     Palpations: Abdomen is soft.     Tenderness: There is no abdominal tenderness.  Musculoskeletal:     Cervical back: Neck supple.  Lymphadenopathy:     Cervical: No cervical adenopathy.  Skin:    General: Skin is warm and dry.  Neurological:     General: No focal deficit present.     Mental Status: She is alert and oriented to person, place, and time.     Cranial Nerves: No cranial nerve deficit.     Gait: Gait abnormal (Walks with cane).    Diagnostic Tests: CT CHEST WITHOUT CONTRAST  TECHNIQUE: Multidetector CT imaging of the chest was performed following the standard protocol without IV contrast.  COMPARISON:  Chest radiograph 01/26/2020  FINDINGS: Cardiovascular: Abdominal aorta is normal caliber  with atherosclerotic calcification. There is no retroperitoneal or periportal lymphadenopathy. No pelvic lymphadenopathy.  Mediastinum/Nodes: No axillary or supraclavicular adenopathy. No mediastinal or hilar adenopathy. No pericardial fluid. Esophagus normal.  Lungs/Pleura: In the superior segment of the LEFT lower lobe lobular mass measures 3.1 by 2.1 cm (image 71/CT series 3).  Within the LEFT lower lobe 11 mm nodule (image 101/series 3). Additional 4 mm pulmonary nodule in the LEFT lower lobe on image 84).  Angular density in the superior aspect of the LEFT upper lobe measures 14 mm.  There is small centrilobular ground-glass nodules within the upper lobes and lower lobes.  The several larger ground-glass nodules are present within the lower lobes. For example 9 mm ground-glass nodule in the RIGHT lower lobe image 87/series 3. 6 mm ground-glass nodule on image 64 in the RIGHT lower lobe. Similar rounded ground-glass nodules in the LEFT lower lobe  Upper Abdomen: Limited view of the liver, kidneys, pancreas are unremarkable. Normal adrenal glands.  Musculoskeletal: No aggressive osseous lesion.  IMPRESSION: 1. Lobular mass in the LEFT lower lobe is most concerning for bronchogenic carcinoma. Recommend FDG PET scan for staging and biopsy planning. 2. Two pulmonary nodules in the LEFT lower lobe are concerning for synchronous bronchogenic carcinoma versus metastasis. 3. No mediastinal lymphadenopathy identified on noncontrast exam. 4. Bilateral scattered ground-glass nodules which are small in the upper lobes and larger and more defined in the lower lobes. Differential includes inflammatory / infectious process versus multifocal neoplastic process.   Electronically Signed   By: Suzy Bouchard M.D.   On: 05/03/2020 09:18 NUCLEAR MEDICINE PET SKULL BASE TO THIGH  TECHNIQUE: 10.9 mCi F-18 FDG was injected intravenously. Full-ring PET imaging was  performed from  the skull base to thigh after the radiotracer. CT data was obtained and used for attenuation correction and anatomic localization.  Fasting blood glucose: 138 mg/dl  COMPARISON:  05/03/2020 chest CT  FINDINGS: Mediastinal blood pool activity: SUV max 2.5  Liver activity: SUV max NA  NECK: No areas of abnormal hypermetabolism.  Incidental CT findings: No cervical adenopathy.  CHEST: No thoracic nodal hypermetabolism. The left lower lobe lobulated lung mass is hypermetabolic at 3.1 x 1.9 cm and a S.U.V. max of 9.9 on 99/3.  Smaller nodules are primarily below PET resolution. A 1.0 cm left lower lobe nodule on 114/3 is not hypermetabolic, at the low end of PET resolution.  Incidental CT findings: Deferred to recent diagnostic CT. Coronary and aortic atherosclerosis with tiny hiatal hernia.  ABDOMEN/PELVIS: No abdominopelvic parenchymal or nodal hypermetabolism.  Incidental CT findings: Cholecystectomy. Normal adrenal glands. Abdominal aortic atherosclerosis. Abdominal wall laxity containing nonobstructive bowel loops. Globular enlarged uterus is likely related to underlying fibroids.  A left pelvic, presumably ovarian 5.2 x 5.9 cm lesion demonstrates fat and ossific densities, consistent with a dermoid.  SKELETON: Soft tissue density subcutaneous or intramuscular mass just cephalad to the right iliac crest measures 4.9x4.1 cm and a S.U.V. max of 2.7 on 183/3.  Separate more posterior right iliac focus of hypermetabolism may correspond to vague increased density. Example at a S.U.V. max of 6.6 on 196/3.  Incidental CT findings: Peripherally sclerotic left iliac 1.2 cm lesion on 201/3 has indolent characteristics.  IMPRESSION: 1. Hypermetabolic left lower lobe lung mass, consistent with primary bronchogenic carcinoma. 2. Right iliac hypermetabolism, highly suspicious for isolated osseous metastasis. 3. Soft tissue mass just cephalad to  the right iliac crest is nonspecific but most likely represents an intramuscular metastasis. 4. Smaller nonspecific pulmonary nodules, primarily below PET resolution. 5. Incidental findings, including: Coronary artery atherosclerosis. Aortic Atherosclerosis (ICD10-I70.0). Fibroid uterus. Left ovarian dermoid.   Electronically Signed   By: Abigail Miyamoto M.D.   On: 05/06/2020 12:23  I have reviewed the images from the CT and PET/CT.  Dominant 3 cm left lower lobe lung mass that is hypermetabolic.  Other ill-defined lung lesions.  Iliac hypermetabolism on PET.  Pulmonary function testing FVC 2.02 (83%) FEV1 1.35 (71%) FEV1 1.43 (75%) postbronchodilator DLCO 17.61 (93%)  Impression: Natalie [name] is a 60 year old former smoker with a history of tobacco abuse, asthma, COPD, CVA, insulin-dependent diabetes complicated by neuropathy, hepatitis C, hyperlipidemia, obesity, sleep apnea, and schizoaffective disorder.  She has a 75-pack-year history of smoking prior to quitting in 2012.  She was hospitalized in the fall 2021 with diabetic ketoacidosis.  During that admission she had a shadow noted on chest x-ray.  That was followed up with a CT in November which showed a 3.1 x 2.1 cm left lower lobe lung mass.  There was no mediastinal or hilar adenopathy.  She then had a PET/CT which showed the lung mass was hypermetabolic.  There was a question of bone metastasis with an area of hypermetabolism in the right iliac.  Needle biopsy of the bone lesion showed no evidence of malignancy.  Bronchoscopic biopsy of the lung mass showed non-small cell carcinoma.  Clinical stage is T2, N0, stage Ib.  Given the negative needle biopsy of the bone lesion, I think we should give her the benefit of the doubt that that is not evidence of metastatic disease.  We discussed the potential treatment options of surgery versus radiation.  They both have advantages and disadvantages.  She is aware  of those.  She  understands that surgery is the gold standard.  The appropriate procedure would be a left lower lobectomy.  She does have adequate coronary reserve to tolerate that based on her pulmonary function testing.  I recommended that we proceed with Xi robotic assisted left lower lobectomy.  That would include a lymph node dissection.  We will also plan to do intercostal nerve blocks.  I informed her of the general nature of the procedure including the need for general anesthesia, the incisions to be used, the use of a drainage tube postoperatively, the expected hospital stay, and the overall recovery.  She understands there is no guarantee of a cure.  I informed her of the indications, risks, benefits, and alternatives.  She understands the risks include, but not limited to death, MI, DVT, PE, bleeding, possible need for transfusion, infection, prolonged air leak, cardiac arrhythmias, as well as possibility of other unforeseeable complications.  She accepts the risk and agrees to proceed.  Plan: Xi robotic assisted left lower lobectomy on Monday, 07/12/2020.  Melrose Nakayama, MD [facility] [phone]

## 2020-06-29 ENCOUNTER — Encounter: Payer: Self-pay | Admitting: *Deleted

## 2020-07-02 ENCOUNTER — Other Ambulatory Visit: Payer: Self-pay

## 2020-07-06 ENCOUNTER — Ambulatory Visit: Payer: Medicaid Other | Admitting: Pulmonary Disease

## 2020-07-07 ENCOUNTER — Telehealth: Payer: Self-pay | Admitting: *Deleted

## 2020-07-07 NOTE — Telephone Encounter (Addendum)
Georga Kaufmann, NP [facility] called stating that patient is going to have her lobectomy on the 24 th and that she spoke with surgeon due to patient stating that she would not be able to come to [facility] for 3 weeks post op and he could not tell her the limitations at this time. She is asking if patient will be needing any services in home since she is living alone. side effects also reports that patient had a fall due to leg weakness and is concerned about possible brain mets. She requests a return call.

## 2020-07-07 NOTE — Progress Notes (Signed)
[id number] [address], [address] AT [address] [place] [address] Phone: [phone] Fax: [place], [place] - [address] [place] [address] Phone: [phone] Fax: [phone]  [place], [place] - [address] [place] [address] Phone: [phone] Fax: [phone]      Your procedure is scheduled on 07/12/20.  Report to [facility] Main Entrance "A" at 08:00 A.M., and check in at the Admitting office.  Call this number if you have problems the morning of surgery:  [phone]  Call [phone] if you have any questions prior to your surgery date Monday-Friday 8am-4pm    Remember:  Do not eat or drink after midnight the night before your surgery     Take these medicines the morning of surgery with A SIP OF WATER  acetaminophen (TYLENOL) albuterol (VENTOLIN HFA) - as needed (Bring inhaler with you the day of surgery) Fluticasone-Umeclidin-Vilant (TRELEGY ELLIPTA) (Bring inhaler with you the day of surgery) citalopram (CELEXA) gabapentin (NEURONTIN)  latanoprost (XALATAN) (eye drops) LORazepam (ATIVAN)  As of today, STOP taking any Aspirin (unless otherwise instructed by your surgeon) Aleve, Naproxen, Ibuprofen, Motrin, Advil, Goody's, BC's, all herbal medications, fish oil, and all vitamins.   HOW TO MANAGE YOUR DIABETES BEFORE AND AFTER SURGERY  Why is it important to control my blood sugar before and after surgery? . Improving blood sugar levels before and after surgery helps healing and can limit problems. . A way of improving blood sugar control is eating a healthy diet by: o  Eating less sugar and carbohydrates o  Increasing activity/exercise o  Talking with your doctor about reaching your blood sugar goals . High blood sugars (greater than 180 mg/dL) can raise your risk of  infections and slow your recovery, so you will need to focus on controlling your diabetes during the weeks before surgery. . Make sure that the doctor who takes care of your diabetes knows about your planned surgery including the date and location.  How do I manage my blood sugar before surgery? . Check your blood sugar at least 4 times a day, starting 2 days before surgery, to make sure that the level is not too high or low. o Check your blood sugar the morning of your surgery when you wake up and every 2 hours until you get to the Short Stay unit. . If your blood sugar is less than 70 mg/dL, you will need to treat for low blood sugar: o Do not take insulin. o Treat a low blood sugar (less than 70 mg/dL) with  cup of clear juice (cranberry or apple), 4 glucose tablets, OR glucose gel. Recheck blood sugar in 15 minutes after treatment (to make sure it is greater than 70 mg/dL). If your blood sugar is not greater than 70 mg/dL on recheck, call [phone] o  for further instructions. . Report your blood sugar to the short stay nurse when you get to Short Stay.  . If you are admitted to the hospital after surgery: o Your blood sugar will be checked by the staff and you will probably be given insulin after surgery (instead of oral diabetes medicines) to make sure you have good blood sugar levels. o The goal for blood sugar control after surgery is 80-180 mg/dL.     WHAT DO I DO ABOUT MY DIABETES MEDICATION?   Marland Kitchen  Do not take oral diabetes medicines (pills):  the morning of surgery.  . THE NIGHT BEFORE SURGERY, take 7 units of insulin detemir (LEVEMIR). You will take your normal morning dose of insulin detemir (LEVEMIR) the day before surgery on 07/11/20.    Marland Kitchen THE MORNING OF SURGERY, do not take your insulin aspart (NOVOLOG). Take 7 units of insulin detemir (LEVEMIR).  . The day of surgery, do not take other diabetes injectables, including Byetta (exenatide), Bydureon (exenatide ER), Victoza  (liraglutide), or Trulicity (dulaglutide).                       Do not wear jewelry, make up, or nail polish            Do not wear lotions, powders, perfumes/colognes, or deodorant.            Do not shave 48 hours prior to surgery.              Do not bring valuables to the hospital.            [facility] is not responsible for any belongings or valuables.  Do NOT Smoke (Tobacco/Vaping) or drink Alcohol 24 hours prior to your procedure If you use a CPAP at night, you may bring all equipment for your overnight stay.   Contacts, glasses, dentures or bridgework may not be worn into surgery.      For patients admitted to the hospital, discharge time will be determined by your treatment team.   Patients discharged the day of surgery will not be allowed to drive home, and someone needs to stay with them for 24 hours.    Special instructions:   [facility]- Preparing For Surgery  Before surgery, you can play an important role. Because skin is not sterile, your skin needs to be as free of germs as possible. You can reduce the number of germs on your skin by washing with CHG (chlorahexidine gluconate) Soap before surgery.  CHG is an antiseptic cleaner which kills germs and bonds with the skin to continue killing germs even after washing.    Oral Hygiene is also important to reduce your risk of infection.  Remember - BRUSH YOUR TEETH THE MORNING OF SURGERY WITH YOUR REGULAR TOOTHPASTE  Please do not use if you have an allergy to CHG or antibacterial soaps. If your skin becomes reddened/irritated stop using the CHG.  Do not shave (including legs and underarms) for at least 48 hours prior to first CHG shower. It is OK to shave your face.  Please follow these instructions carefully.   1. Shower the NIGHT BEFORE SURGERY and the MORNING OF SURGERY with CHG Soap.   2. If you chose to wash your hair, wash your hair first as usual with your normal shampoo.  3. After you shampoo, rinse your  hair and body thoroughly to remove the shampoo.  4. Use CHG as you would any other liquid soap. You can apply CHG directly to the skin and wash gently with a scrungie or a clean washcloth.   5. Apply the CHG Soap to your body ONLY FROM THE NECK DOWN.  Do not use on open wounds or open sores. Avoid contact with your eyes, ears, mouth and genitals (private parts). Wash Face and genitals (private parts)  with your normal soap.   6. Wash thoroughly, paying special attention to the area where your surgery will be performed.  7. Thoroughly rinse your body with warm water  from the neck down.  8. DO NOT shower/wash with your normal soap after using and rinsing off the CHG Soap.  9. Pat yourself dry with a CLEAN TOWEL.  10. Wear CLEAN PAJAMAS to bed the night before surgery  11. Place CLEAN SHEETS on your bed the night of your first shower and DO NOT SLEEP WITH PETS.   Day of Surgery: Wear Clean/Comfortable clothing the morning of surgery Do not apply any deodorants/lotions.   Remember to brush your teeth WITH YOUR REGULAR TOOTHPASTE.   Please read over the following fact sheets that you were given.

## 2020-07-07 NOTE — Telephone Encounter (Signed)
Agreed. No brain imaging is needed at this time.  Will await final path.

## 2020-07-07 NOTE — Telephone Encounter (Signed)
Spoke with Keta and addressed all concerns. Informed that since there is no lymph node involvement on imaging at this time then brain mets is not as likely but would confirm with Dr. Grayland Ormond regarding brain mets and need for further imaging. Pt denies headache, blurred vision, and double vision. Has some confusion and weakness in legs that Keta contributes to pt's psych medications and lorazepam that she is taking.

## 2020-07-08 ENCOUNTER — Other Ambulatory Visit
Admission: RE | Admit: 2020-07-08 | Discharge: 2020-07-08 | Disposition: A | Discharge status: Home / Self Care | Payer: Medicaid Other | Source: Ambulatory Visit | Attending: Thoracic Surgery (Cardiothoracic Vascular Surgery) | Admitting: Thoracic Surgery (Cardiothoracic Vascular Surgery)

## 2020-07-08 ENCOUNTER — Encounter (HOSPITAL_COMMUNITY): Payer: Self-pay

## 2020-07-08 ENCOUNTER — Encounter (HOSPITAL_COMMUNITY)
Admission: RE | Admit: 2020-07-08 | Discharge: 2020-07-08 | Disposition: A | Discharge status: Home / Self Care | Payer: Medicaid Other | Source: Ambulatory Visit | Attending: Thoracic Surgery (Cardiothoracic Vascular Surgery) | Admitting: Thoracic Surgery (Cardiothoracic Vascular Surgery)

## 2020-07-08 ENCOUNTER — Other Ambulatory Visit: Payer: Self-pay

## 2020-07-08 DIAGNOSIS — Z794 Long term (current) use of insulin: Secondary | ICD-10-CM | POA: Diagnosis not present

## 2020-07-08 DIAGNOSIS — E119 Type 2 diabetes mellitus without complications: Secondary | ICD-10-CM | POA: Insufficient documentation

## 2020-07-08 DIAGNOSIS — C3492 Malignant neoplasm of unspecified part of left bronchus or lung: Secondary | ICD-10-CM | POA: Diagnosis not present

## 2020-07-08 DIAGNOSIS — G4733 Obstructive sleep apnea (adult) (pediatric): Secondary | ICD-10-CM | POA: Diagnosis not present

## 2020-07-08 DIAGNOSIS — Z01812 Encounter for preprocedural laboratory examination: Secondary | ICD-10-CM | POA: Insufficient documentation

## 2020-07-08 DIAGNOSIS — Z01818 Encounter for other preprocedural examination: Secondary | ICD-10-CM | POA: Insufficient documentation

## 2020-07-08 DIAGNOSIS — Z20822 Contact with and (suspected) exposure to covid-19: Secondary | ICD-10-CM | POA: Insufficient documentation

## 2020-07-08 LAB — COMPREHENSIVE METABOLIC PANEL
ALT: 143 U/L — ABNORMAL HIGH (ref 0–44)
AST: 174 U/L — ABNORMAL HIGH (ref 15–41)
Albumin: 4.5 g/dL (ref 3.5–5.0)
Alkaline Phosphatase: 91 U/L (ref 38–126)
Anion gap: 24 — ABNORMAL HIGH (ref 5–15)
BUN: 15 mg/dL (ref 8–23)
CO2: 17 mmol/L — ABNORMAL LOW (ref 22–32)
Calcium: 9.4 mg/dL (ref 8.9–10.3)
Chloride: 89 mmol/L — ABNORMAL LOW (ref 98–111)
Creatinine, Ser: 0.85 mg/dL (ref 0.44–1.00)
GFR, Estimated: >60 mL/min (ref >60)
Glucose, Bld: 379 mg/dL — ABNORMAL HIGH (ref 70–99)
Potassium: 4.6 mmol/L (ref 3.5–5.1)
Sodium: 130 mmol/L — ABNORMAL LOW (ref 135–145)
Total Bilirubin: 1.8 mg/dL — ABNORMAL HIGH (ref 0.3–1.2)
Total Protein: 7.5 g/dL (ref 6.5–8.1)

## 2020-07-08 LAB — TYPE AND SCREEN
ABO/RH(D): O POS
Antibody Screen: NEGATIVE

## 2020-07-08 LAB — BLOOD GAS, ARTERIAL
Acid-base deficit: 5.1 mmol/L — ABNORMAL HIGH (ref 0.0–2.0)
Bicarbonate: 18.9 mmol/L — ABNORMAL LOW (ref 20.0–28.0)
Drawn by: 602861
FIO2: 21
O2 Saturation: 95.9 %
Patient temperature: 37
pCO2 arterial: 31.7 mmHg — ABNORMAL LOW (ref 32.0–48.0)
pH, Arterial: 7.394 (ref 7.350–7.450)
pO2, Arterial: 86.2 mmHg (ref 83.0–108.0)

## 2020-07-08 LAB — CBC
HCT: 36.2 % (ref 36.0–46.0)
Hemoglobin: 12.7 g/dL (ref 12.0–15.0)
MCH: 31.8 pg (ref 26.0–34.0)
MCHC: 35.1 g/dL (ref 30.0–36.0)
MCV: 90.7 fL (ref 80.0–100.0)
Platelets: 197 10*3/uL (ref 150–400)
RBC: 3.99 MIL/uL (ref 3.87–5.11)
RDW: 16.1 % — ABNORMAL HIGH (ref 11.5–15.5)
WBC: 9.4 10*3/uL (ref 4.0–10.5)
nRBC: 0.5 % — ABNORMAL HIGH (ref 0.0–0.2)

## 2020-07-08 LAB — URINALYSIS, MICROSCOPIC (REFLEX)

## 2020-07-08 LAB — GLUCOSE, CAPILLARY
Glucose-Capillary: 422 mg/dL — ABNORMAL HIGH (ref 70–99)
Glucose-Capillary: 367 mg/dL — ABNORMAL HIGH (ref 70–99)

## 2020-07-08 LAB — PROTIME-INR
INR: 1.2 (ref 0.8–1.2)
Prothrombin Time: 14.9 seconds (ref 11.4–15.2)

## 2020-07-08 LAB — URINALYSIS, ROUTINE W REFLEX MICROSCOPIC
Bilirubin Urine: NEGATIVE
Glucose, UA: >=500 mg/dL — AB
Ketones, ur: NEGATIVE mg/dL
Leukocytes,Ua: NEGATIVE
Nitrite: NEGATIVE
Protein, ur: NEGATIVE mg/dL
Specific Gravity, Urine: 1.015 (ref 1.005–1.030)
pH: 5.5 (ref 5.0–8.0)

## 2020-07-08 LAB — SURGICAL PCR SCREEN
MRSA, PCR: NEGATIVE
Staphylococcus aureus: NEGATIVE

## 2020-07-08 LAB — HEMOGLOBIN A1C
Hgb A1c MFr Bld: 7.7 % — ABNORMAL HIGH (ref 4.8–5.6)
Mean Plasma Glucose: 174.29 mg/dL

## 2020-07-08 LAB — APTT: aPTT: 32 seconds (ref 24–36)

## 2020-07-08 IMAGING — CR DG CHEST 2V
2 series · 2 of 2 positions shown · non-contrast
Comparison: None.

CLINICAL DATA: Preoperative

EXAM:
CHEST - 2 VIEW

[w chest pa]
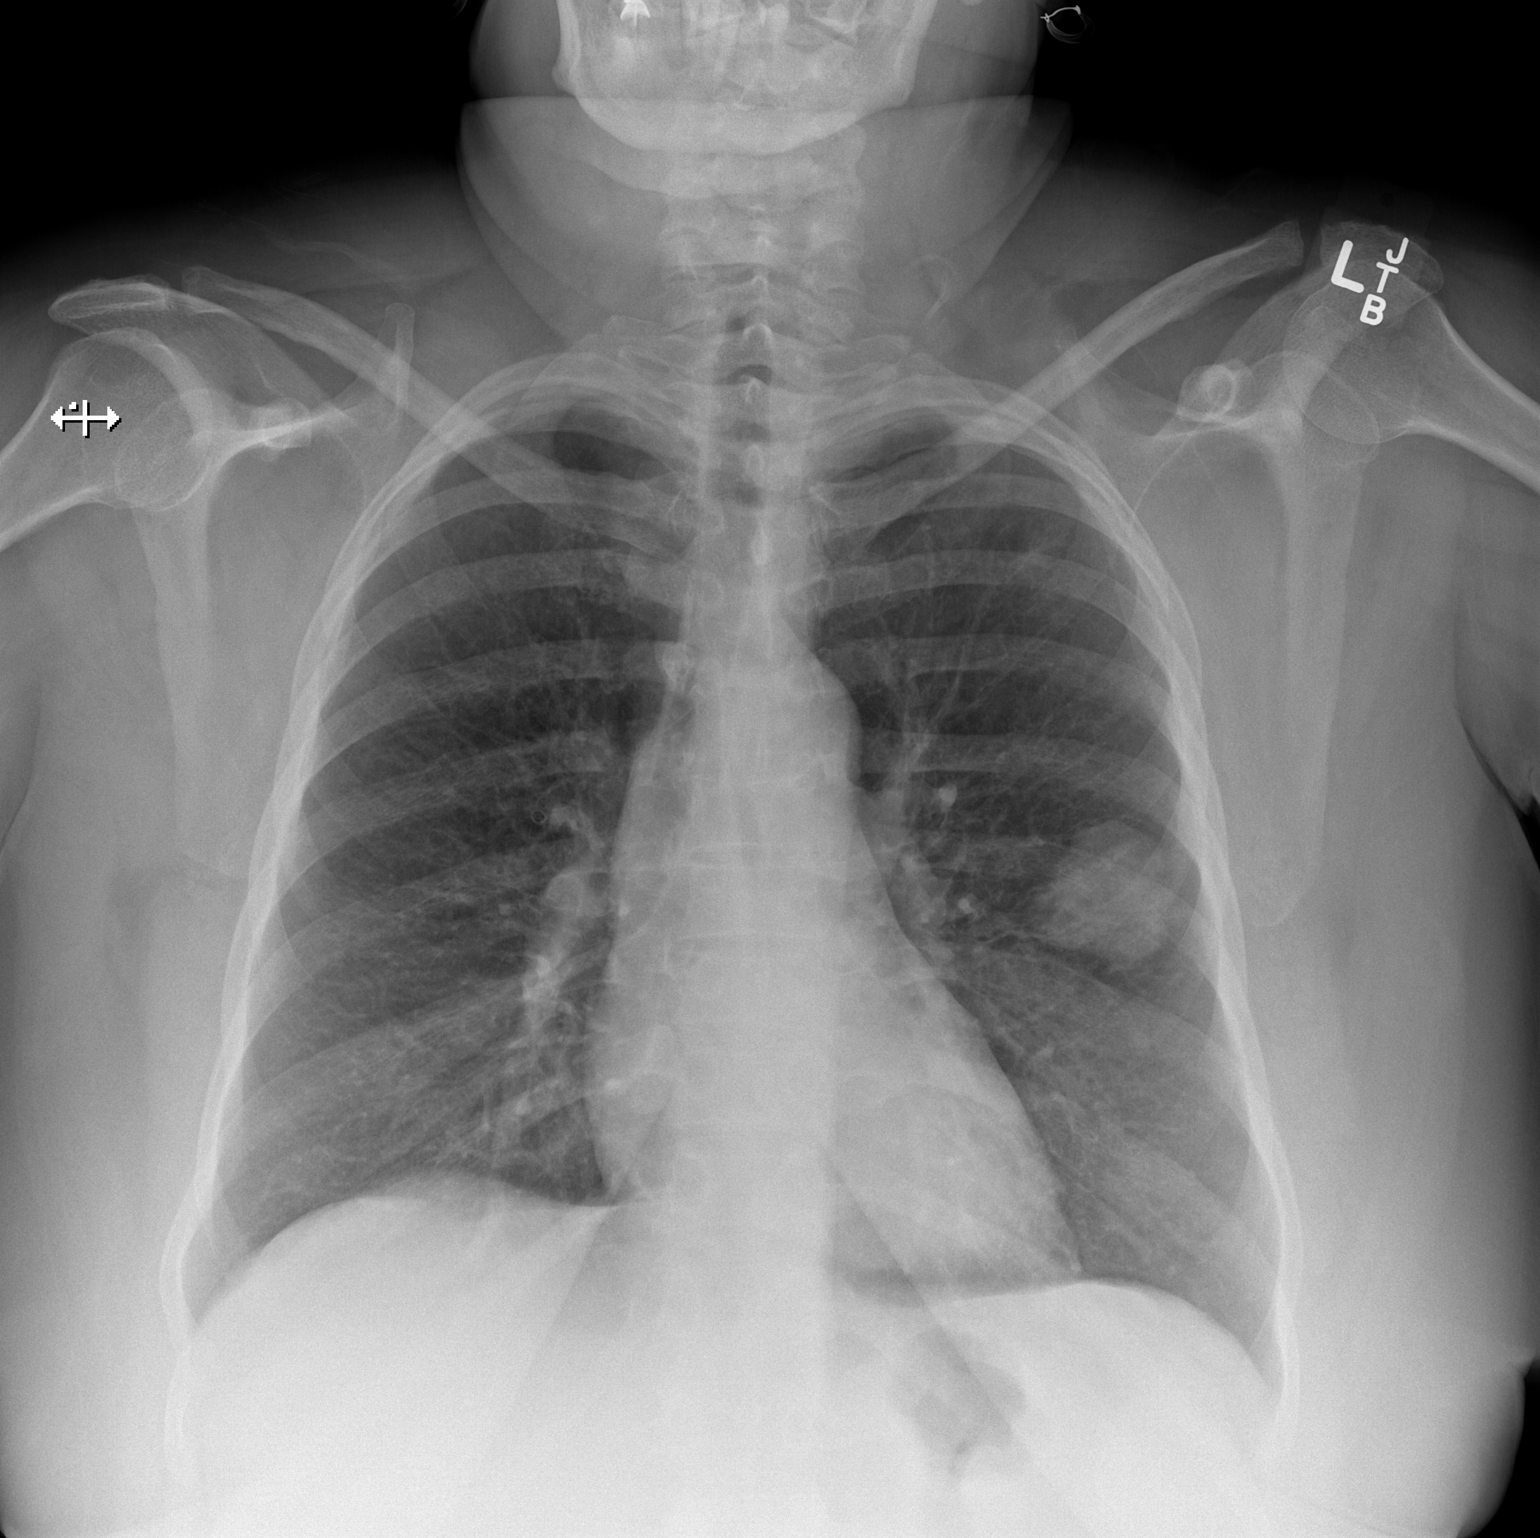

[w chest lat]
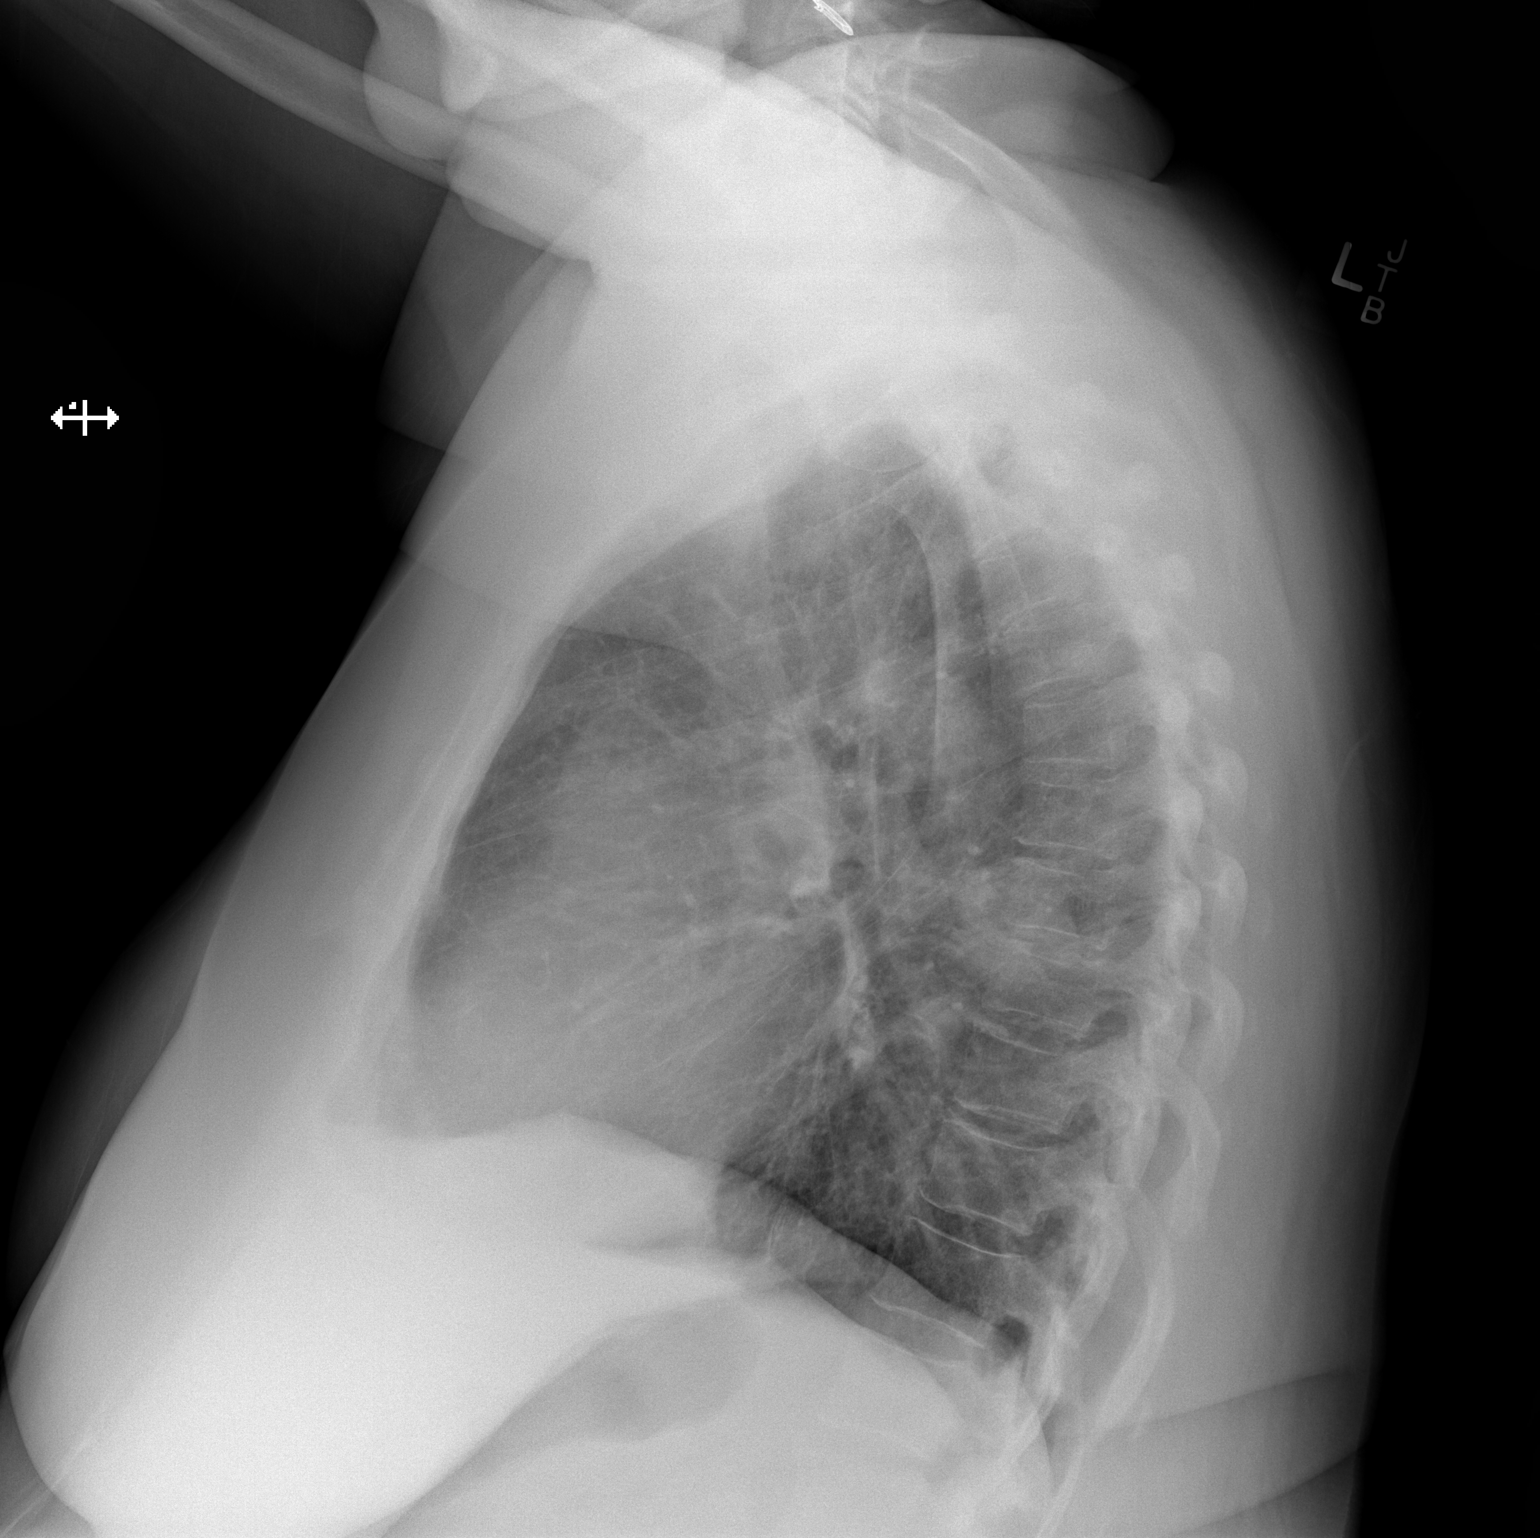

[2 of 2 positions shown; findings below may reference images not displayed]

FINDINGS: The heart size and mediastinal contours are within normal limits.
There is a mass of the superior segment left lower lobe measuring
4.1 cm in projection. The visualized skeletal structures are
unremarkable.
IMPRESSION: 1.  No acute abnormality of the lungs.

2. There is a mass of the superior segment left lower lobe measuring
4.1 cm in projection.

## 2020-07-08 NOTE — Progress Notes (Addendum)
PCP:  [facility] Cardiologist:  Denies   EKG:  07/08/20 CXR:  07/08/20 ECHO:  Denies Stress Test:  Denies Cardiac Cath:  Denies  Fasting Blood Sugar- 130-170s.   Checks Blood Sugar_3__ times a day  OSA: Yes CPAP:  No.  Patient states it was recalled and is awaiting replacement.  ASA/Blood Thinners:  No  Covid test was done today, 07/09/19  Anesthesia Review:  Yes, BG 422 at PAT today.  Drank 3 small bottles of water and at recheck her BG was 367.  A1C ordered and pending result.  Jeneen Rinks, [place] aware and stated since BG is coming down she may go home, but to continue drinking water and monitor her glucose closely.  Patient denies shortness of breath, fever, cough, and chest pain at PAT appointment.  Patient verbalized understanding of instructions provided today at the PAT appointment.  Patient asked to review instructions at home and day of surgery.

## 2020-07-09 LAB — SARS CORONAVIRUS 2 (TAT 6-24 HRS): SARS Coronavirus 2: NEGATIVE

## 2020-07-09 NOTE — Progress Notes (Signed)
Anesthesia Chart Review:  History of poorly controlled insulin-dependent DM2.  She was admitted in August 2021 for DKA with diabetic coma, was intubated for 5 days.  At that time she was reportedly not taking her medicines due to cost.  She states that her blood sugar has been better controlled since discharge.  However, on arrival to preadmission testing her CBG was 422.  She reports that she did take her prescribed medications.  She says she ate a "jelly sandwich" prior to arrival.  Recheck of CBG at end of appointment showed downtrending, 367.  She was counseled on importance of monitoring blood sugar, taking medications as prescribed, and contacting her primary care doctor if her blood glucose remains uncontrolled.  Her A1c on preop labs was 7.7, indicating that overall her diabetes has been better controlled than previously.  Preop labs also showed elevated AST at 174, ALT at 143, total bilirubin 1.8.  This appears to be new compared to previous labs.  She does have a history of chronic hepatitis C reportedly first diagnosed in 2000.  She was evaluated by gastroenterology in 2019 in [place].  Per office visit note 05/07/2018 by Leilani Able, PA-C, "Chronic hepatitis C -she had a previous elastography which was negative for fibrosis -hepatitis-C virus was not detected, she is treatment naive so she was able to clear the infection on her own. -no further workup necessary, her LFTs were within normal limits."   OSA on CPAP.  Patient does not currently have a CPAP machine.  Reviewed labs with Dr. Marcie Bal.  Recommended repeat hepatic function panel on day of surgery.  EKG 07/08/2020: Sinus tachycardia.  Rate 108. Possible Left atrial enlargement. Minimal voltage criteria for LVH, may be normal variant ( Cornell product )  PFT 06/22/2020: FVC-%Pred-Pre Latest Units: % 83  FEV1-%Pred-Pre Latest Units: % 71  FEV1FVC-%Pred-Pre Latest Units: % 84  TLC % pred Latest Units: % 81  RV % pred Latest  Units: % 87  DLCO unc % pred Latest Units: % 93    CHEST - 2 VIEW 07/08/2020: COMPARISON:  None.  FINDINGS: The heart size and mediastinal contours are within normal limits. There is a mass of the superior segment left lower lobe measuring 4.1 cm in projection. The visualized skeletal structures are unremarkable.  IMPRESSION: 1.  No acute abnormality of the lungs.  2. There is a mass of the superior segment left lower lobe measuring 4.1 cm in projection.  CT chest 05/03/2020: IMPRESSION: 1. Lobular mass in the LEFT lower lobe is most concerning for bronchogenic carcinoma. Recommend FDG PET scan for staging and biopsy planning. 2. Two pulmonary nodules in the LEFT lower lobe are concerning for synchronous bronchogenic carcinoma versus metastasis. 3. No mediastinal lymphadenopathy identified on noncontrast exam. 4. Bilateral scattered ground-glass nodules which are small in the upper lobes and larger and more defined in the lower lobes. Differential includes inflammatory / infectious process versus multifocal neoplastic process.    Wynonia Musty [facility]/Anesthesiology Phone [phone] 07/09/2020 9:37 AM

## 2020-07-09 NOTE — Anesthesia Preprocedure Evaluation (Addendum)
Anesthesia Evaluation  Patient identified by MRN, date of birth, ID band Patient awake    Reviewed: NPO status , Patient's Chart, lab work & pertinent test results  Airway Mallampati: II  TM Distance: >3 FB Neck ROM: Full    Dental  (+) Upper Dentures   Pulmonary asthma , sleep apnea (noncompliant with CPAP) , COPD,  COPD inhaler, former smoker,  Non small cell left lower lobe   Pulmonary exam normal        Cardiovascular  Rhythm:Regular Rate:Normal     Neuro/Psych Depression Bipolar Disorder Schizophrenia CVA (2014), No Residual Symptoms    GI/Hepatic negative GI ROS, (+) Hepatitis -, C  Endo/Other  diabetes, Poorly Controlled, Type 2, Insulin Dependent  Renal/GU   negative genitourinary   Musculoskeletal negative musculoskeletal ROS (+)   Abdominal (+)  Abdomen: soft. Bowel sounds: normal.  Peds  Hematology  (+) anemia ,   Anesthesia Other Findings   Reproductive/Obstetrics                           Anesthesia Physical Anesthesia Plan  ASA: III  Anesthesia Plan: General   Post-op Pain Management:    Induction: Intravenous  PONV Risk Score and Plan: 3 and Ondansetron, Midazolam, Treatment may vary due to age or medical condition and Dexamethasone  Airway Management Planned: Mask and Double Lumen EBT  Additional Equipment: Arterial line  Intra-op Plan:   Post-operative Plan: Extubation in OR  Informed Consent: I have reviewed the patients History and Physical, chart, labs and discussed the procedure including the risks, benefits and alternatives for the proposed anesthesia with the patient or authorized representative who has indicated his/her understanding and acceptance.     Dental advisory given  Plan Discussed with: CRNA  Anesthesia Plan Comments: (Lab Results      Component                Value               Date                      WBC                      9.4                  07/08/2020                HGB                      12.7                07/08/2020                HCT                      36.2                07/08/2020                MCV                      90.7                07/08/2020                PLT  197                 07/08/2020           Lab Results      Component                Value               Date                      NA                       130 (L)             07/08/2020                K                        4.6                 07/08/2020                CO2                      17 (L)              07/08/2020                GLUCOSE                  379 (H)             07/08/2020                BUN                      15                  07/08/2020                CREATININE               0.85                07/08/2020                CALCIUM                  9.4                 07/08/2020                GFRNONAA                 >60                 07/08/2020                GFRAA                    >60                 01/30/2020           PAT note by Karoline Caldwell, PA-C: History of poorly controlled insulin-dependent DM2.  She was admitted in August 2021 for DKA with diabetic coma, was intubated for 5 days.  At that time she was reportedly not taking her medicines due to cost.  She states that her blood  sugar has been better controlled since discharge.  However, on arrival to preadmission testing her CBG was 422.  She reports that she did take her prescribed medications.  She says she ate a "jelly sandwich" prior to arrival.  Recheck of CBG at end of appointment showed downtrending, 367.  She was counseled on importance of monitoring blood sugar, taking medications as prescribed, and contacting her primary care doctor if her blood glucose remains uncontrolled.  Her A1c on preop labs was 7.7, indicating that overall her diabetes has been better controlled than previously.  Preop labs also showed elevated AST at  174, ALT at 143, total bilirubin 1.8.  This appears to be new compared to previous labs.  She does have a history of chronic hepatitis C reportedly first diagnosed in 2000.  She was evaluated by gastroenterology in 2019 in [place].  Per office visit note 05/07/2018 by Leilani Able, PA-C, "Chronic hepatitis C -she had a previous elastography which was negative for fibrosis -hepatitis-C virus was not detected, she is treatment naive so she was able to clear the infection on her own. -no further workup necessary, her LFTs were within normal limits."   OSA on CPAP.  Patient does not currently have a CPAP machine.  Reviewed labs with Dr. Marcie Bal.  Recommended repeat hepatic function panel on day of surgery.  EKG 07/08/2020: Sinus tachycardia.  Rate 108. Possible Left atrial enlargement. Minimal voltage criteria for LVH, may be normal variant ( Cornell product )  PFT 06/22/2020: FVC-%Pred-Pre Latest Units: % 83 FEV1-%Pred-Pre Latest Units: % 71 FEV1FVC-%Pred-Pre Latest Units: % 84 TLC % pred Latest Units: % 81 RV % pred Latest Units: % 87 DLCO unc % pred Latest Units: % 93   CHEST - 2 VIEW 07/08/2020: COMPARISON:  None.  FINDINGS: The heart size and mediastinal contours are within normal limits. There is a mass of the superior segment left lower lobe measuring 4.1 cm in projection. The visualized skeletal structures are unremarkable.  IMPRESSION: 1.  No acute abnormality of the lungs. 2. There is a mass of the superior segment left lower lobe measuring 4.1 cm in projection.  CT chest 05/03/2020: IMPRESSION: 1. Lobular mass in the LEFT lower lobe is most concerning for bronchogenic carcinoma. Recommend FDG PET scan for staging and biopsy planning. 2. Two pulmonary nodules in the LEFT lower lobe are concerning for synchronous bronchogenic carcinoma versus metastasis. 3. No mediastinal lymphadenopathy identified on noncontrast exam. 4. Bilateral scattered ground-glass nodules  which are small in the upper lobes and larger and more defined in the lower lobes. Differential includes inflammatory / infectious process versus multifocal neoplastic process. )      Anesthesia Quick Evaluation

## 2020-07-12 ENCOUNTER — Inpatient Hospital Stay (HOSPITAL_COMMUNITY): Payer: Medicaid Other

## 2020-07-12 ENCOUNTER — Inpatient Hospital Stay (HOSPITAL_COMMUNITY): Payer: Medicaid Other | Admitting: Physician Assistant

## 2020-07-12 ENCOUNTER — Inpatient Hospital Stay (HOSPITAL_COMMUNITY)
Admission: RE | Admit: 2020-07-12 | Discharge: 2020-07-15 | DRG: 163 | Disposition: A | Discharge status: Home / Self Care | Payer: Medicaid Other | Attending: Thoracic Surgery (Cardiothoracic Vascular Surgery) | Admitting: Thoracic Surgery (Cardiothoracic Vascular Surgery)

## 2020-07-12 ENCOUNTER — Encounter (HOSPITAL_COMMUNITY)
Admission: RE | Disposition: A | Discharge status: Home / Self Care | Payer: Self-pay | Source: Home / Self Care | Attending: Thoracic Surgery (Cardiothoracic Vascular Surgery)

## 2020-07-12 DIAGNOSIS — Z23 Encounter for immunization: Secondary | ICD-10-CM | POA: Diagnosis present

## 2020-07-12 DIAGNOSIS — E785 Hyperlipidemia, unspecified: Secondary | ICD-10-CM | POA: Diagnosis present

## 2020-07-12 DIAGNOSIS — Z87891 Personal history of nicotine dependence: Secondary | ICD-10-CM | POA: Diagnosis not present

## 2020-07-12 DIAGNOSIS — C3432 Malignant neoplasm of lower lobe, left bronchus or lung: Principal | ICD-10-CM | POA: Diagnosis present

## 2020-07-12 DIAGNOSIS — Z902 Acquired absence of lung [part of]: Secondary | ICD-10-CM

## 2020-07-12 DIAGNOSIS — Z9889 Other specified postprocedural states: Secondary | ICD-10-CM

## 2020-07-12 DIAGNOSIS — Z6841 Body Mass Index (BMI) 40.0 and over, adult: Secondary | ICD-10-CM | POA: Diagnosis not present

## 2020-07-12 DIAGNOSIS — E1140 Type 2 diabetes mellitus with diabetic neuropathy, unspecified: Secondary | ICD-10-CM | POA: Diagnosis present

## 2020-07-12 DIAGNOSIS — E669 Obesity, unspecified: Secondary | ICD-10-CM | POA: Diagnosis present

## 2020-07-12 DIAGNOSIS — Z7951 Long term (current) use of inhaled steroids: Secondary | ICD-10-CM | POA: Diagnosis not present

## 2020-07-12 DIAGNOSIS — C771 Secondary and unspecified malignant neoplasm of intrathoracic lymph nodes: Secondary | ICD-10-CM | POA: Diagnosis present

## 2020-07-12 DIAGNOSIS — G4733 Obstructive sleep apnea (adult) (pediatric): Secondary | ICD-10-CM | POA: Diagnosis present

## 2020-07-12 DIAGNOSIS — E1165 Type 2 diabetes mellitus with hyperglycemia: Secondary | ICD-10-CM | POA: Diagnosis present

## 2020-07-12 DIAGNOSIS — F250 Schizoaffective disorder, bipolar type: Secondary | ICD-10-CM | POA: Diagnosis present

## 2020-07-12 DIAGNOSIS — J441 Chronic obstructive pulmonary disease with (acute) exacerbation: Secondary | ICD-10-CM | POA: Diagnosis not present

## 2020-07-12 DIAGNOSIS — Z8673 Personal history of transient ischemic attack (TIA), and cerebral infarction without residual deficits: Secondary | ICD-10-CM

## 2020-07-12 DIAGNOSIS — Z978 Presence of other specified devices: Secondary | ICD-10-CM

## 2020-07-12 DIAGNOSIS — J939 Pneumothorax, unspecified: Secondary | ICD-10-CM

## 2020-07-12 DIAGNOSIS — Z87442 Personal history of urinary calculi: Secondary | ICD-10-CM

## 2020-07-12 DIAGNOSIS — D509 Iron deficiency anemia, unspecified: Secondary | ICD-10-CM | POA: Diagnosis present

## 2020-07-12 DIAGNOSIS — C3492 Malignant neoplasm of unspecified part of left bronchus or lung: Secondary | ICD-10-CM

## 2020-07-12 DIAGNOSIS — R911 Solitary pulmonary nodule: Secondary | ICD-10-CM | POA: Diagnosis present

## 2020-07-12 DIAGNOSIS — Z4682 Encounter for fitting and adjustment of non-vascular catheter: Secondary | ICD-10-CM

## 2020-07-12 DIAGNOSIS — Z794 Long term (current) use of insulin: Secondary | ICD-10-CM

## 2020-07-12 DIAGNOSIS — Z9119 Patient's noncompliance with other medical treatment and regimen: Secondary | ICD-10-CM

## 2020-07-12 DIAGNOSIS — J9811 Atelectasis: Secondary | ICD-10-CM | POA: Diagnosis not present

## 2020-07-12 DIAGNOSIS — I700 Atherosclerosis of aorta: Secondary | ICD-10-CM | POA: Diagnosis present

## 2020-07-12 DIAGNOSIS — Z79899 Other long term (current) drug therapy: Secondary | ICD-10-CM

## 2020-07-12 DIAGNOSIS — J95821 Acute postprocedural respiratory failure: Secondary | ICD-10-CM | POA: Diagnosis not present

## 2020-07-12 HISTORY — PX: INTERCOSTAL NERVE BLOCK: SHX5021

## 2020-07-12 HISTORY — PX: LYMPH NODE DISSECTION: SHX5087

## 2020-07-12 LAB — GLUCOSE, CAPILLARY
Glucose-Capillary: 272 mg/dL — ABNORMAL HIGH (ref 70–99)
Glucose-Capillary: 296 mg/dL — ABNORMAL HIGH (ref 70–99)
Glucose-Capillary: 197 mg/dL — ABNORMAL HIGH (ref 70–99)
Glucose-Capillary: 130 mg/dL — ABNORMAL HIGH (ref 70–99)
Glucose-Capillary: 126 mg/dL — ABNORMAL HIGH (ref 70–99)
Glucose-Capillary: 136 mg/dL — ABNORMAL HIGH (ref 70–99)

## 2020-07-12 LAB — CBC
HCT: 35.2 % — ABNORMAL LOW (ref 36.0–46.0)
Hemoglobin: 10.8 g/dL — ABNORMAL LOW (ref 12.0–15.0)
MCH: 30.3 pg (ref 26.0–34.0)
MCHC: 30.7 g/dL (ref 30.0–36.0)
MCV: 98.6 fL (ref 80.0–100.0)
Platelets: 196 10*3/uL (ref 150–400)
RBC: 3.57 MIL/uL — ABNORMAL LOW (ref 3.87–5.11)
RDW: 17.6 % — ABNORMAL HIGH (ref 11.5–15.5)
WBC: 16.6 10*3/uL — ABNORMAL HIGH (ref 4.0–10.5)
nRBC: 1.1 % — ABNORMAL HIGH (ref 0.0–0.2)

## 2020-07-12 LAB — ABO/RH: ABO/RH(D): O POS

## 2020-07-12 LAB — BASIC METABOLIC PANEL
Anion gap: 13 (ref 5–15)
BUN: 8 mg/dL (ref 8–23)
CO2: 24 mmol/L (ref 22–32)
Calcium: 7.8 mg/dL — ABNORMAL LOW (ref 8.9–10.3)
Chloride: 99 mmol/L (ref 98–111)
Creatinine, Ser: 0.99 mg/dL (ref 0.44–1.00)
GFR, Estimated: >60 mL/min (ref >60)
Glucose, Bld: 228 mg/dL — ABNORMAL HIGH (ref 70–99)
Potassium: 4.5 mmol/L (ref 3.5–5.1)
Sodium: 136 mmol/L (ref 135–145)

## 2020-07-12 LAB — BLOOD GAS, ARTERIAL
Acid-base deficit: 2.4 mmol/L — ABNORMAL HIGH (ref 0.0–2.0)
Bicarbonate: 25.7 mmol/L (ref 20.0–28.0)
Drawn by: 35493
FIO2: 50
O2 Saturation: 94.6 %
Patient temperature: 36.5
pCO2 arterial: 79.3 mmHg (ref 32.0–48.0)
pH, Arterial: 7.137 — CL (ref 7.350–7.450)
pO2, Arterial: 92.9 mmHg (ref 83.0–108.0)

## 2020-07-12 LAB — HEPATIC FUNCTION PANEL
ALT: 64 U/L — ABNORMAL HIGH (ref 0–44)
AST: 57 U/L — ABNORMAL HIGH (ref 15–41)
Albumin: 3.8 g/dL (ref 3.5–5.0)
Alkaline Phosphatase: 79 U/L (ref 38–126)
Bilirubin, Direct: 0.2 mg/dL (ref 0.0–0.2)
Indirect Bilirubin: 0.8 mg/dL (ref 0.3–0.9)
Total Bilirubin: 1 mg/dL (ref 0.3–1.2)
Total Protein: 6.4 g/dL — ABNORMAL LOW (ref 6.5–8.1)

## 2020-07-12 LAB — HEMOGLOBIN AND HEMATOCRIT, BLOOD
HCT: 31 % — ABNORMAL LOW (ref 36.0–46.0)
Hemoglobin: 10.3 g/dL — ABNORMAL LOW (ref 12.0–15.0)

## 2020-07-12 LAB — BETA-HYDROXYBUTYRIC ACID: Beta-Hydroxybutyric Acid: 0.26 mmol/L (ref 0.05–0.27)

## 2020-07-12 IMAGING — DX DG CHEST 1V PORT
1 series · 1 of 1 positions shown · non-contrast
Comparison: [DATE] chest radiograph.

CLINICAL DATA: Postoperative for lobectomy

EXAM:
PORTABLE CHEST 1 VIEW

[chest]
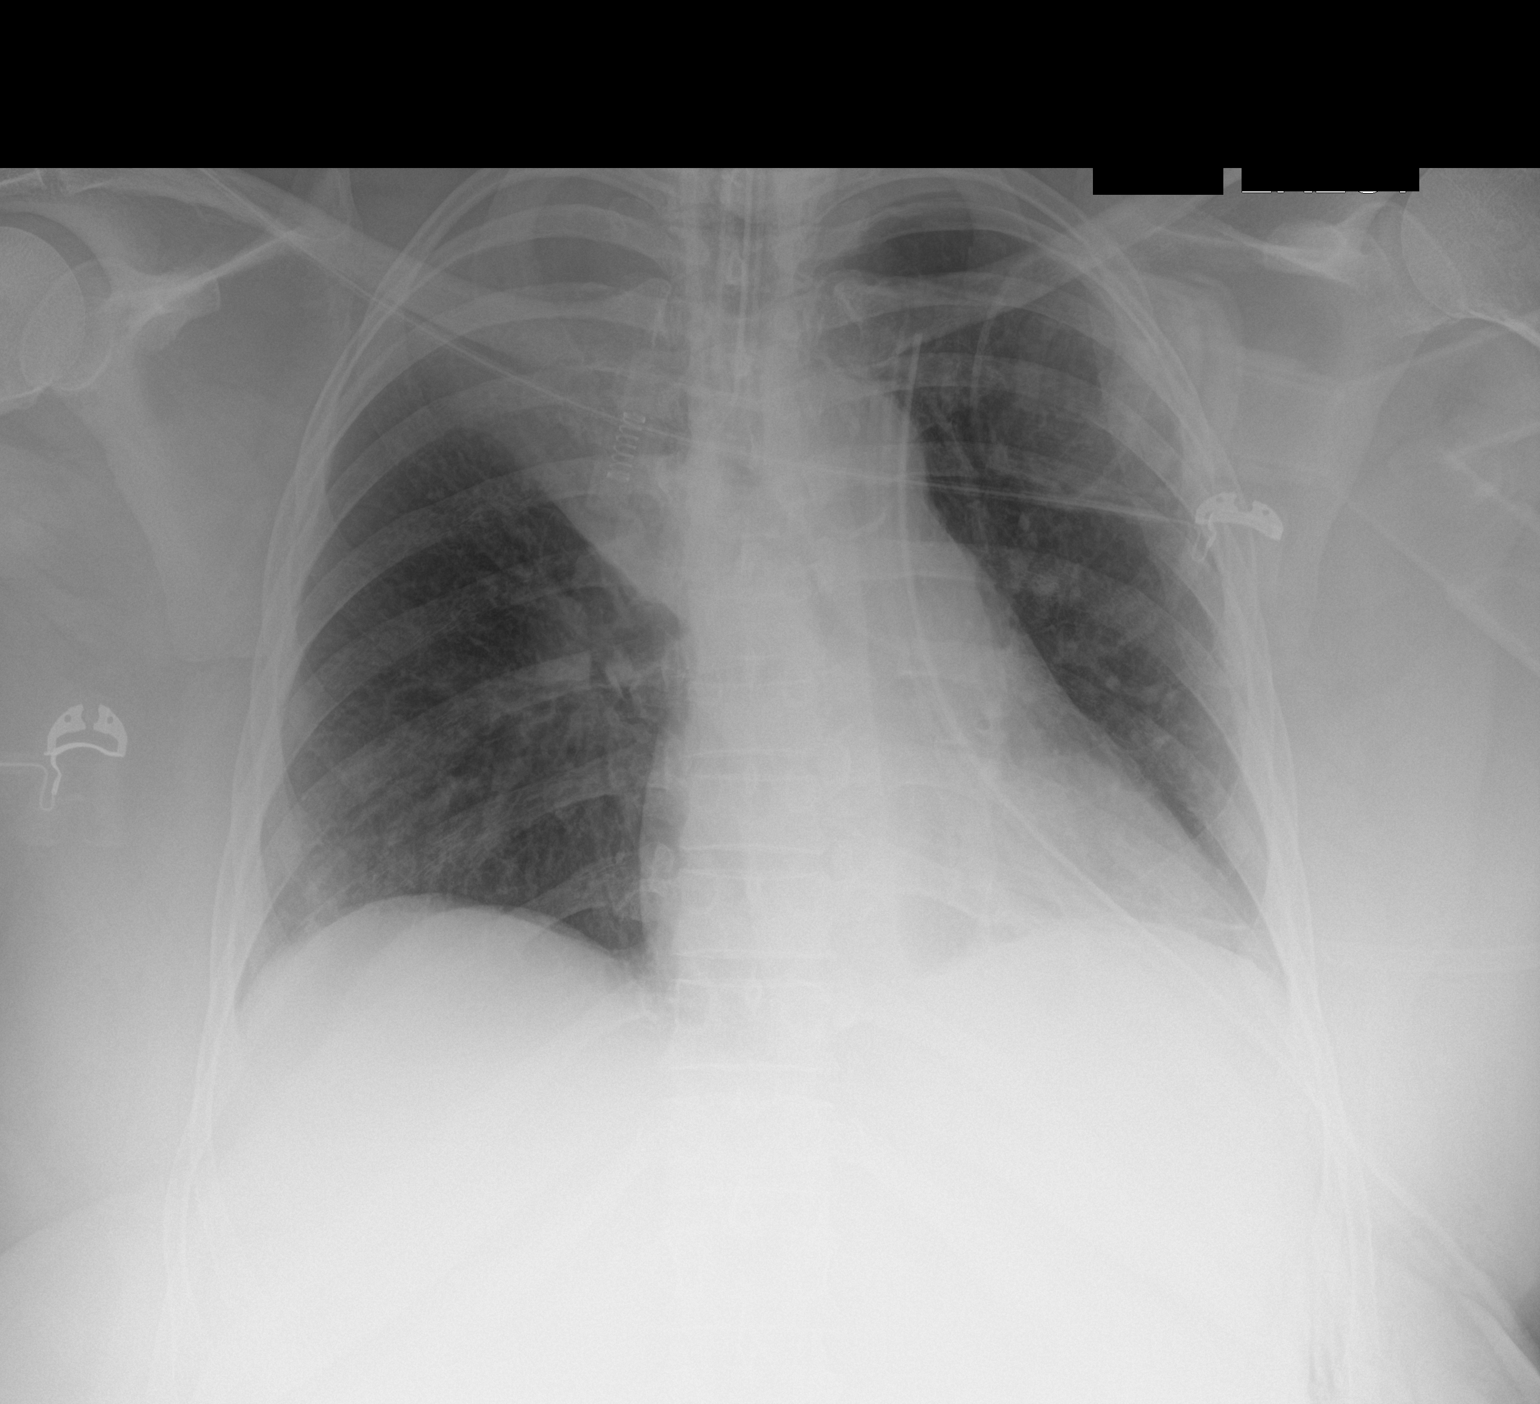

[1 of 1 positions shown; findings below may reference images not displayed]

FINDINGS: Left apical chest tube in place. Endotracheal tube tip is 2.8 cm
above the carina. Stable cardiomediastinal silhouette with normal
heart size. Tiny left apical pneumothorax, less than 5%. No right
pneumothorax. No pleural effusion. Mild bibasilar atelectasis.
Complete right upper lobe atelectasis.
IMPRESSION: 1. Tiny left apical pneumothorax, less than 5%. Left apical chest
tube in place.
2. Well-positioned endotracheal tube.
3. Complete right upper lobe atelectasis. Follow-up chest
radiographs suggested.
4. Mild bibasilar atelectasis.

## 2020-07-12 SURGERY — LOBECTOMY, LUNG, ROBOT-ASSISTED, USING VATS
Anesthesia: General | Site: Chest

## 2020-07-12 MED ORDER — IPRATROPIUM-ALBUTEROL 0.5-2.5 (3) MG/3ML IN SOLN
RESPIRATORY_TRACT | Status: AC
  Filled 2020-07-12: qty 3

## 2020-07-12 MED ORDER — BUPIVACAINE HCL (PF) 0.5 % IJ SOLN
INTRAMUSCULAR | Status: AC
  Filled 2020-07-12: qty 30

## 2020-07-12 MED ORDER — REVEFENACIN 175 MCG/3ML IN SOLN
175.0000 ug | Freq: Every day | RESPIRATORY_TRACT | Status: DC
  Filled 2020-07-12 (×2): qty 3

## 2020-07-12 MED ORDER — ACETAMINOPHEN 10 MG/ML IV SOLN
1000.0000 mg | Freq: Once | INTRAVENOUS | Status: DC | PRN
  Administered 2020-07-12: 1000 mg via INTRAVENOUS

## 2020-07-12 MED ORDER — SODIUM CHLORIDE 0.9 % IV SOLN: INTRAVENOUS | Status: DC

## 2020-07-12 MED ORDER — OXYCODONE HCL 5 MG PO TABS
5.0000 mg | ORAL_TABLET | ORAL | Status: DC | PRN
  Administered 2020-07-13 (×3): 10 mg via ORAL
  Administered 2020-07-13: 5 mg via ORAL
  Administered 2020-07-14 – 2020-07-15 (×5): 10 mg via ORAL
  Filled 2020-07-12 (×3): qty 2
  Filled 2020-07-12: qty 1
  Filled 2020-07-12 (×5): qty 2

## 2020-07-12 MED ORDER — PROPOFOL 10 MG/ML IV BOLUS
INTRAVENOUS | Status: AC
  Filled 2020-07-12: qty 20

## 2020-07-12 MED ORDER — ENOXAPARIN SODIUM 40 MG/0.4ML ~~LOC~~ SOLN
40.0000 mg | SUBCUTANEOUS | Status: DC
  Administered 2020-07-13 – 2020-07-14 (×2): 40 mg via SUBCUTANEOUS
  Filled 2020-07-12 (×2): qty 0.4

## 2020-07-12 MED ORDER — LIDOCAINE 5 % EX PTCH
2.0000 | MEDICATED_PATCH | CUTANEOUS | Status: DC
  Administered 2020-07-12 – 2020-07-14 (×3): 2 via TRANSDERMAL
  Filled 2020-07-12 (×3): qty 2

## 2020-07-12 MED ORDER — LACTATED RINGERS IV SOLN: INTRAVENOUS | Status: DC

## 2020-07-12 MED ORDER — LEVALBUTEROL HCL 0.63 MG/3ML IN NEBU: 0.6300 mg | INHALATION_SOLUTION | Freq: Three times a day (TID) | RESPIRATORY_TRACT | Status: DC | PRN

## 2020-07-12 MED ORDER — BENZTROPINE MESYLATE 1 MG PO TABS
1.0000 mg | ORAL_TABLET | Freq: Every day | ORAL | Status: DC
  Administered 2020-07-13 – 2020-07-14 (×2): 1 mg via ORAL
  Filled 2020-07-12 (×2): qty 1

## 2020-07-12 MED ORDER — ZIPRASIDONE HCL 40 MG PO CAPS
80.0000 mg | ORAL_CAPSULE | Freq: Every day | ORAL | Status: DC
  Administered 2020-07-12 – 2020-07-14 (×3): 80 mg via ORAL
  Filled 2020-07-12 (×3): qty 2
  Filled 2020-07-12 (×2): qty 1

## 2020-07-12 MED ORDER — LIDOCAINE 2% (20 MG/ML) 5 ML SYRINGE
INTRAMUSCULAR | Status: DC | PRN
  Administered 2020-07-12: 60 mg via INTRAVENOUS

## 2020-07-12 MED ORDER — DEXAMETHASONE SODIUM PHOSPHATE 10 MG/ML IJ SOLN
INTRAMUSCULAR | Status: DC | PRN
  Administered 2020-07-12: 10 mg via INTRAVENOUS

## 2020-07-12 MED ORDER — SENNOSIDES-DOCUSATE SODIUM 8.6-50 MG PO TABS
1.0000 | ORAL_TABLET | Freq: Every day | ORAL | Status: DC
  Administered 2020-07-12 – 2020-07-14 (×3): 1 via ORAL
  Filled 2020-07-12 (×3): qty 1

## 2020-07-12 MED ORDER — CEFAZOLIN SODIUM 1 G IJ SOLR
INTRAMUSCULAR | Status: AC
  Filled 2020-07-12: qty 20

## 2020-07-12 MED ORDER — LATANOPROST 0.005 % OP SOLN
1.0000 [drp] | Freq: Every day | OPHTHALMIC | Status: DC
  Administered 2020-07-12 – 2020-07-14 (×3): 1 [drp] via OPHTHALMIC
  Filled 2020-07-12: qty 2.5

## 2020-07-12 MED ORDER — FENTANYL CITRATE (PF) 100 MCG/2ML IJ SOLN: 50.0000 ug | INTRAMUSCULAR | Status: DC | PRN

## 2020-07-12 MED ORDER — UMECLIDINIUM BROMIDE 62.5 MCG/INH IN AEPB
1.0000 | INHALATION_SPRAY | Freq: Every day | RESPIRATORY_TRACT | Status: DC
  Administered 2020-07-14 – 2020-07-15 (×2): 1 via RESPIRATORY_TRACT
  Filled 2020-07-12: qty 7

## 2020-07-12 MED ORDER — SODIUM CHLORIDE FLUSH 0.9 % IV SOLN
INTRAVENOUS | Status: DC | PRN
  Administered 2020-07-12: 85 mL

## 2020-07-12 MED ORDER — DEXTROSE 50 % IV SOLN
0.0000 mL | INTRAVENOUS | Status: DC | PRN
Start: 2020-07-12 — End: 2020-07-15

## 2020-07-12 MED ORDER — INSULIN ASPART 100 UNIT/ML ~~LOC~~ SOLN
SUBCUTANEOUS | Status: DC | PRN
  Administered 2020-07-12: 8 [IU] via SUBCUTANEOUS
  Administered 2020-07-12: 15 [IU] via SUBCUTANEOUS

## 2020-07-12 MED ORDER — SODIUM CHLORIDE 0.9% IV SOLUTION
INTRAVENOUS | Status: AC | PRN
  Administered 2020-07-12: 1000 mL

## 2020-07-12 MED ORDER — METHYLPREDNISOLONE SODIUM SUCC 40 MG IJ SOLR
40.0000 mg | Freq: Two times a day (BID) | INTRAMUSCULAR | Status: DC
Start: 2020-07-13 — End: 2020-07-12

## 2020-07-12 MED ORDER — ACETAMINOPHEN 500 MG PO TABS
1000.0000 mg | ORAL_TABLET | Freq: Four times a day (QID) | ORAL | Status: DC
  Administered 2020-07-13 – 2020-07-15 (×8): 1000 mg via ORAL
  Filled 2020-07-12 (×8): qty 2

## 2020-07-12 MED ORDER — PROPOFOL 10 MG/ML IV BOLUS
INTRAVENOUS | Status: DC | PRN
  Administered 2020-07-12: 20 mg via INTRAVENOUS
  Administered 2020-07-12: 40 mg via INTRAVENOUS
  Administered 2020-07-12: 150 mg via INTRAVENOUS
  Administered 2020-07-12 (×3): 50 mg via INTRAVENOUS

## 2020-07-12 MED ORDER — FENTANYL CITRATE (PF) 100 MCG/2ML IJ SOLN
25.0000 ug | INTRAMUSCULAR | Status: DC | PRN
  Administered 2020-07-12 – 2020-07-13 (×2): 25 ug via INTRAVENOUS
  Administered 2020-07-13 (×2): 50 ug via INTRAVENOUS
  Administered 2020-07-13: 25 ug via INTRAVENOUS
  Filled 2020-07-12 (×5): qty 2

## 2020-07-12 MED ORDER — LIDOCAINE 2% (20 MG/ML) 5 ML SYRINGE
INTRAMUSCULAR | Status: AC
  Filled 2020-07-12: qty 5

## 2020-07-12 MED ORDER — FENTANYL CITRATE (PF) 250 MCG/5ML IJ SOLN
INTRAMUSCULAR | Status: DC | PRN
  Administered 2020-07-12 (×8): 50 ug via INTRAVENOUS

## 2020-07-12 MED ORDER — BISACODYL 5 MG PO TBEC
10.0000 mg | DELAYED_RELEASE_TABLET | Freq: Every day | ORAL | Status: DC
  Administered 2020-07-12 – 2020-07-14 (×3): 10 mg via ORAL
  Filled 2020-07-12 (×3): qty 2

## 2020-07-12 MED ORDER — LORAZEPAM 1 MG PO TABS
1.0000 mg | ORAL_TABLET | Freq: Two times a day (BID) | ORAL | Status: DC
  Administered 2020-07-13 – 2020-07-15 (×5): 1 mg via ORAL
  Filled 2020-07-12 (×5): qty 1

## 2020-07-12 MED ORDER — PROPOFOL 1000 MG/100ML IV EMUL: 0.0000 ug/kg/min | INTRAVENOUS | Status: DC

## 2020-07-12 MED ORDER — METHYLPREDNISOLONE SODIUM SUCC 125 MG IJ SOLR: 125.0000 mg | Freq: Once | INTRAMUSCULAR | Status: DC

## 2020-07-12 MED ORDER — ACETAMINOPHEN 160 MG/5ML PO SOLN: 1000.0000 mg | Freq: Four times a day (QID) | ORAL | Status: DC

## 2020-07-12 MED ORDER — INSULIN REGULAR(HUMAN) IN NACL 100-0.9 UT/100ML-% IV SOLN
INTRAVENOUS | Status: DC
  Filled 2020-07-12: qty 100

## 2020-07-12 MED ORDER — ONDANSETRON HCL 4 MG/2ML IJ SOLN: 4.0000 mg | Freq: Once | INTRAMUSCULAR | Status: DC | PRN

## 2020-07-12 MED ORDER — DEXTROSE IN LACTATED RINGERS 5 % IV SOLN: INTRAVENOUS | Status: DC

## 2020-07-12 MED ORDER — GABAPENTIN 300 MG PO CAPS
300.0000 mg | ORAL_CAPSULE | Freq: Two times a day (BID) | ORAL | Status: DC
  Administered 2020-07-13 – 2020-07-15 (×5): 300 mg via ORAL
  Filled 2020-07-12 (×5): qty 1

## 2020-07-12 MED ORDER — IPRATROPIUM-ALBUTEROL 0.5-2.5 (3) MG/3ML IN SOLN
3.0000 mL | Freq: Once | RESPIRATORY_TRACT | Status: AC
  Administered 2020-07-12: 3 mL via RESPIRATORY_TRACT

## 2020-07-12 MED ORDER — CEFAZOLIN SODIUM-DEXTROSE 2-4 GM/100ML-% IV SOLN
2.0000 g | INTRAVENOUS | Status: AC
  Administered 2020-07-12 (×2): 2 g via INTRAVENOUS
  Filled 2020-07-12: qty 100

## 2020-07-12 MED ORDER — ORAL CARE MOUTH RINSE: 15.0000 mL | Freq: Once | OROMUCOSAL | Status: AC

## 2020-07-12 MED ORDER — INSULIN REGULAR(HUMAN) IN NACL 100-0.9 UT/100ML-% IV SOLN
INTRAVENOUS | Status: DC
  Administered 2020-07-12: 19 [IU]/h via INTRAVENOUS
  Filled 2020-07-12: qty 100

## 2020-07-12 MED ORDER — CHLORHEXIDINE GLUCONATE 0.12 % MT SOLN
OROMUCOSAL | Status: AC
  Administered 2020-07-12: 15 mL via OROMUCOSAL
  Filled 2020-07-12: qty 15

## 2020-07-12 MED ORDER — IPRATROPIUM-ALBUTEROL 0.5-2.5 (3) MG/3ML IN SOLN: 3.0000 mL | Freq: Four times a day (QID) | RESPIRATORY_TRACT | Status: DC | PRN

## 2020-07-12 MED ORDER — CITALOPRAM HYDROBROMIDE 20 MG PO TABS
20.0000 mg | ORAL_TABLET | Freq: Every morning | ORAL | Status: DC
  Administered 2020-07-13 – 2020-07-15 (×3): 20 mg via ORAL
  Filled 2020-07-12 (×3): qty 1

## 2020-07-12 MED ORDER — FENTANYL CITRATE (PF) 250 MCG/5ML IJ SOLN
INTRAMUSCULAR | Status: AC
  Filled 2020-07-12: qty 5

## 2020-07-12 MED ORDER — HYDROMORPHONE HCL 1 MG/ML IJ SOLN
INTRAMUSCULAR | Status: AC
  Filled 2020-07-12: qty 1

## 2020-07-12 MED ORDER — ARFORMOTEROL TARTRATE 15 MCG/2ML IN NEBU
15.0000 ug | INHALATION_SOLUTION | Freq: Two times a day (BID) | RESPIRATORY_TRACT | Status: DC
  Administered 2020-07-12: 20:00:00 15 ug via RESPIRATORY_TRACT
  Filled 2020-07-12: qty 2

## 2020-07-12 MED ORDER — ATORVASTATIN CALCIUM 40 MG PO TABS
40.0000 mg | ORAL_TABLET | Freq: Every day | ORAL | Status: DC
  Administered 2020-07-13 – 2020-07-14 (×2): 40 mg via ORAL
  Filled 2020-07-12 (×2): qty 1

## 2020-07-12 MED ORDER — INSULIN ASPART 100 UNIT/ML ~~LOC~~ SOLN: 0.0000 [IU] | Freq: Four times a day (QID) | SUBCUTANEOUS | Status: DC

## 2020-07-12 MED ORDER — IPRATROPIUM BROMIDE 0.02 % IN SOLN: 0.5000 mg | Freq: Four times a day (QID) | RESPIRATORY_TRACT | Status: DC

## 2020-07-12 MED ORDER — POLYETHYLENE GLYCOL 3350 17 G PO PACK: 17.0000 g | PACK | Freq: Every day | ORAL | Status: DC

## 2020-07-12 MED ORDER — ACETAMINOPHEN 10 MG/ML IV SOLN
INTRAVENOUS | Status: AC
  Filled 2020-07-12: qty 100

## 2020-07-12 MED ORDER — 0.9 % SODIUM CHLORIDE (POUR BTL) OPTIME
TOPICAL | Status: DC | PRN
  Administered 2020-07-12: 2000 mL

## 2020-07-12 MED ORDER — SODIUM CHLORIDE 0.9 % IV SOLN: INTRAVENOUS | Status: DC | PRN

## 2020-07-12 MED ORDER — MIDAZOLAM HCL 5 MG/5ML IJ SOLN
INTRAMUSCULAR | Status: DC | PRN
  Administered 2020-07-12: 2 mg via INTRAVENOUS

## 2020-07-12 MED ORDER — HYDROMORPHONE HCL 1 MG/ML IJ SOLN
0.2500 mg | INTRAMUSCULAR | Status: DC | PRN
  Administered 2020-07-12: 0.25 mg via INTRAVENOUS
  Administered 2020-07-12: 0.5 mg via INTRAVENOUS

## 2020-07-12 MED ORDER — BUDESONIDE 0.5 MG/2ML IN SUSP
0.5000 mg | Freq: Two times a day (BID) | RESPIRATORY_TRACT | Status: DC
  Administered 2020-07-12: 0.5 mg via RESPIRATORY_TRACT
  Filled 2020-07-12: qty 2

## 2020-07-12 MED ORDER — DOCUSATE SODIUM 50 MG/5ML PO LIQD: 100.0000 mg | Freq: Two times a day (BID) | ORAL | Status: DC

## 2020-07-12 MED ORDER — BUPIVACAINE LIPOSOME 1.3 % IJ SUSP
20.0000 mL | INTRAMUSCULAR | Status: DC
  Filled 2020-07-12: qty 20

## 2020-07-12 MED ORDER — CHLORHEXIDINE GLUCONATE 0.12 % MT SOLN: 15.0000 mL | Freq: Once | OROMUCOSAL | Status: AC

## 2020-07-12 MED ORDER — INSULIN ASPART 100 UNIT/ML ~~LOC~~ SOLN
SUBCUTANEOUS | Status: AC
  Filled 2020-07-12: qty 1

## 2020-07-12 MED ORDER — MIDAZOLAM HCL 2 MG/2ML IJ SOLN
INTRAMUSCULAR | Status: AC
  Filled 2020-07-12: qty 2

## 2020-07-12 MED ORDER — ONDANSETRON HCL 4 MG/2ML IJ SOLN: 4.0000 mg | Freq: Four times a day (QID) | INTRAMUSCULAR | Status: DC | PRN

## 2020-07-12 MED ORDER — FLUTICASONE-UMECLIDIN-VILANT 100-62.5-25 MCG/INH IN AEPB: 1.0000 | INHALATION_SPRAY | Freq: Every day | RESPIRATORY_TRACT | Status: DC

## 2020-07-12 MED ORDER — ROCURONIUM BROMIDE 10 MG/ML (PF) SYRINGE
PREFILLED_SYRINGE | INTRAVENOUS | Status: AC
  Filled 2020-07-12: qty 10

## 2020-07-12 MED ORDER — PROPOFOL 500 MG/50ML IV EMUL
INTRAVENOUS | Status: DC | PRN
  Administered 2020-07-12: 100 ug/kg/min via INTRAVENOUS

## 2020-07-12 MED ORDER — TRAMADOL HCL 50 MG PO TABS
50.0000 mg | ORAL_TABLET | Freq: Four times a day (QID) | ORAL | Status: DC | PRN
  Administered 2020-07-13: 100 mg via ORAL
  Administered 2020-07-13: 50 mg via ORAL
  Administered 2020-07-14: 100 mg via ORAL
  Filled 2020-07-12: qty 2
  Filled 2020-07-12: qty 1
  Filled 2020-07-12: qty 2

## 2020-07-12 MED ORDER — ROCURONIUM BROMIDE 10 MG/ML (PF) SYRINGE
PREFILLED_SYRINGE | INTRAVENOUS | Status: DC | PRN
  Administered 2020-07-12: 30 mg via INTRAVENOUS
  Administered 2020-07-12: 50 mg via INTRAVENOUS
  Administered 2020-07-12: 70 mg via INTRAVENOUS

## 2020-07-12 MED ORDER — CHLORHEXIDINE GLUCONATE CLOTH 2 % EX PADS
6.0000 | MEDICATED_PAD | Freq: Every day | CUTANEOUS | Status: DC
  Administered 2020-07-12 – 2020-07-14 (×3): 6 via TOPICAL

## 2020-07-12 MED ORDER — FLUTICASONE FUROATE-VILANTEROL 200-25 MCG/INH IN AEPB
1.0000 | INHALATION_SPRAY | Freq: Every day | RESPIRATORY_TRACT | Status: DC
  Administered 2020-07-14 – 2020-07-15 (×2): 1 via RESPIRATORY_TRACT
  Filled 2020-07-12: qty 28

## 2020-07-12 SURGICAL SUPPLY — 106 items
BLADE CLIPPER SURG (BLADE) IMPLANT
CANISTER SUCT 3000ML PPV (MISCELLANEOUS) ×6 IMPLANT
CANNULA REDUC XI 12-8 STAPL (CANNULA) ×2
CANNULA REDUCER 12-8 DVNC XI (CANNULA) ×4 IMPLANT
CATH THORACIC 28FR (CATHETERS) IMPLANT
CATH THORACIC 28FR RT ANG (CATHETERS) IMPLANT
CATH THORACIC 36FR (CATHETERS) IMPLANT
CATH THORACIC 36FR RT ANG (CATHETERS) IMPLANT
CLIP VESOCCLUDE MED 6/CT (CLIP) IMPLANT
CNTNR URN SCR LID CUP LEK RST (MISCELLANEOUS) ×20 IMPLANT
CONN ST 1/4X3/8  BEN (MISCELLANEOUS) ×1
CONN ST 1/4X3/8 BEN (MISCELLANEOUS) ×2 IMPLANT
CONN Y 3/8X3/8X3/8  BEN (MISCELLANEOUS)
CONN Y 3/8X3/8X3/8 BEN (MISCELLANEOUS) IMPLANT
CONT SPEC 4OZ STRL OR WHT (MISCELLANEOUS) ×10
DEFOGGER SCOPE WARMER CLEARIFY (MISCELLANEOUS) ×3 IMPLANT
DERMABOND ADVANCED (GAUZE/BANDAGES/DRESSINGS) ×1
DERMABOND ADVANCED .7 DNX12 (GAUZE/BANDAGES/DRESSINGS) ×2 IMPLANT
DRAIN CHANNEL 28F RND 3/8 FF (WOUND CARE) IMPLANT
DRAIN CHANNEL 32F RND 10.7 FF (WOUND CARE) IMPLANT
DRAPE ARM DVNC X/XI (DISPOSABLE) ×8 IMPLANT
DRAPE COLUMN DVNC XI (DISPOSABLE) ×2 IMPLANT
DRAPE CV SPLIT W-CLR ANES SCRN (DRAPES) ×6 IMPLANT
DRAPE DA VINCI XI ARM (DISPOSABLE) ×4
DRAPE DA VINCI XI COLUMN (DISPOSABLE) ×1
DRAPE HALF SHEET 40X57 (DRAPES) ×6 IMPLANT
DRAPE INCISE IOBAN 66X45 STRL (DRAPES) ×6 IMPLANT
DRAPE ORTHO SPLIT 77X108 STRL (DRAPES) ×2
DRAPE SURG ORHT 6 SPLT 77X108 (DRAPES) ×4 IMPLANT
ELECT BLADE 6.5 EXT (BLADE) ×6 IMPLANT
ELECT REM PT RETURN 9FT ADLT (ELECTROSURGICAL) ×3
ELECTRODE REM PT RTRN 9FT ADLT (ELECTROSURGICAL) ×2 IMPLANT
GAUZE KITTNER 4X5 RF (MISCELLANEOUS) ×15 IMPLANT
GAUZE SPONGE 4X4 12PLY STRL (GAUZE/BANDAGES/DRESSINGS) ×3 IMPLANT
GLOVE SURG SS PI 8.0 STRL IVOR (GLOVE) ×3 IMPLANT
GLOVE SURG SYN 7.5  E (GLOVE)
GLOVE SURG SYN 7.5 E (GLOVE) IMPLANT
GLOVE TRIUMPH SURG SIZE 7.5 (KITS) IMPLANT
GOWN STRL REUS W/ TWL LRG LVL3 (GOWN DISPOSABLE) ×4 IMPLANT
GOWN STRL REUS W/ TWL XL LVL3 (GOWN DISPOSABLE) ×6 IMPLANT
GOWN STRL REUS W/TWL 2XL LVL3 (GOWN DISPOSABLE) ×3 IMPLANT
GOWN STRL REUS W/TWL LRG LVL3 (GOWN DISPOSABLE) ×2
GOWN STRL REUS W/TWL XL LVL3 (GOWN DISPOSABLE) ×3
HEMOSTAT SURGICEL 2X14 (HEMOSTASIS) ×9 IMPLANT
IRRIGATION STRYKERFLOW (MISCELLANEOUS) ×4 IMPLANT
IRRIGATOR STRYKERFLOW (MISCELLANEOUS) ×6
KIT BASIN OR (CUSTOM PROCEDURE TRAY) ×3 IMPLANT
KIT SUCTION CATH 14FR (SUCTIONS) IMPLANT
KIT TURNOVER KIT B (KITS) ×3 IMPLANT
LOOP VESSEL SUPERMAXI WHITE (MISCELLANEOUS) IMPLANT
NEEDLE HYPO 25GX1X1/2 BEV (NEEDLE) ×3 IMPLANT
NEEDLE SPNL 22GX3.5 QUINCKE BK (NEEDLE) ×3 IMPLANT
NS IRRIG 1000ML POUR BTL (IV SOLUTION) ×6 IMPLANT
PACK CHEST (CUSTOM PROCEDURE TRAY) ×3 IMPLANT
PAD ARMBOARD 7.5X6 YLW CONV (MISCELLANEOUS) ×6 IMPLANT
PORT ACCESS TROCAR AIRSEAL 12 (TROCAR) ×4 IMPLANT
PORT ACCESS TROCAR AIRSEAL 5M (TROCAR) ×2
RELOAD STAPLER 2.5X45 WHT DVNC (STAPLE) ×6 IMPLANT
RELOAD STAPLER 3.5X45 BLU DVNC (STAPLE) ×4 IMPLANT
RELOAD STAPLER 4.3X45 GRN DVNC (STAPLE) ×2 IMPLANT
SCISSORS LAP 5X35 DISP (ENDOMECHANICALS) IMPLANT
SEAL CANN UNIV 5-8 DVNC XI (MISCELLANEOUS) ×4 IMPLANT
SEAL XI 5MM-8MM UNIVERSAL (MISCELLANEOUS) ×2
SEALANT PROGEL (MISCELLANEOUS) IMPLANT
SEALANT SURG COSEAL 4ML (VASCULAR PRODUCTS) IMPLANT
SEALANT SURG COSEAL 8ML (VASCULAR PRODUCTS) IMPLANT
SET TRI-LUMEN FLTR TB AIRSEAL (TUBING) ×9 IMPLANT
SHEARS HARMONIC HDI 20CM (ELECTROSURGICAL) IMPLANT
SOLUTION ELECTROLUBE (MISCELLANEOUS) ×3 IMPLANT
SPONGE INTESTINAL PEANUT (DISPOSABLE) IMPLANT
SPONGE TONSIL TAPE 1 RFD (DISPOSABLE) IMPLANT
STAPLER 45 SUREFORM CVD (STAPLE) ×1
STAPLER 45 SUREFORM CVD DVNC (STAPLE) ×2 IMPLANT
STAPLER CANNULA SEAL DVNC XI (STAPLE) ×4 IMPLANT
STAPLER CANNULA SEAL XI (STAPLE) ×2
STAPLER RELOAD 2.5X45 WHITE (STAPLE) ×3
STAPLER RELOAD 2.5X45 WHT DVNC (STAPLE) ×6
STAPLER RELOAD 3.5X45 BLU DVNC (STAPLE) ×4
STAPLER RELOAD 3.5X45 BLUE (STAPLE) ×2
STAPLER RELOAD 4.3X45 GREEN (STAPLE) ×1
STAPLER RELOAD 4.3X45 GRN DVNC (STAPLE) ×2
SUT PDS AB 3-0 SH 27 (SUTURE) IMPLANT
SUT PROLENE 4 0 RB 1 (SUTURE)
SUT PROLENE 4-0 RB1 .5 CRCL 36 (SUTURE) IMPLANT
SUT SILK  1 MH (SUTURE) ×1
SUT SILK 1 MH (SUTURE) ×2 IMPLANT
SUT SILK 1 TIES 10X30 (SUTURE) ×3 IMPLANT
SUT SILK 2 0 SH (SUTURE) IMPLANT
SUT SILK 2 0SH CR/8 30 (SUTURE) ×3 IMPLANT
SUT SILK 3 0SH CR/8 30 (SUTURE) IMPLANT
SUT VIC AB 1 CTX 36 (SUTURE) ×1
SUT VIC AB 1 CTX36XBRD ANBCTR (SUTURE) ×2 IMPLANT
SUT VIC AB 2-0 CTX 36 (SUTURE) ×3 IMPLANT
SUT VIC AB 3-0 MH 27 (SUTURE) IMPLANT
SUT VIC AB 3-0 X1 27 (SUTURE) ×6 IMPLANT
SUT VICRYL 0 TIES 12 18 (SUTURE) ×3 IMPLANT
SUT VICRYL 0 UR6 27IN ABS (SUTURE) ×6 IMPLANT
SUT VICRYL 2 TP 1 (SUTURE) IMPLANT
SYR 20CC LL (SYRINGE) ×6 IMPLANT
SYSTEM RETRIEVAL ANCHOR 12 (MISCELLANEOUS) ×3 IMPLANT
SYSTEM SAHARA CHEST DRAIN ATS (WOUND CARE) ×3 IMPLANT
TAPE CLOTH 4X10 WHT NS (GAUZE/BANDAGES/DRESSINGS) ×3 IMPLANT
TIP APPLICATOR SPRAY EXTEND 16 (VASCULAR PRODUCTS) IMPLANT
TOWEL GREEN STERILE (TOWEL DISPOSABLE) ×3 IMPLANT
TRAY FOLEY MTR SLVR 16FR STAT (SET/KITS/TRAYS/PACK) ×3 IMPLANT
WATER STERILE IRR 1000ML POUR (IV SOLUTION) ×3 IMPLANT

## 2020-07-12 NOTE — Brief Op Note (Signed)
07/12/2020  2:35 PM  PATIENT:  Natalie [name]  60 y.o. female  PRE-OPERATIVE DIAGNOSIS:  NONSMALL CELL CARCINOMA LEFT LOWER LOBE  POST-OPERATIVE DIAGNOSIS:  NONSMALL CELL CARCINOMA LEFT LOWER LOBE  PROCEDURE:  XI ROBOTIC ASSISTED THORASCOPY-LEFT LOWER LOBECTOMY, LYMPH NODE DISSECTION, INTERCOSTAL NERVE BLOCK   SURGEON:  Surgeon(s) and Role:    Melrose Nakayama, MD - Primary  PHYSICIAN ASSISTANT: Lars Pinks PA-C  ANESTHESIA:   general  EBL:  Per anesthesia record  BLOOD ADMINISTERED:none  DRAINS: 57 Blake drain placed in the left pleural space   LOCAL MEDICATIONS USED:  OTHER Exparel  SPECIMEN:  Source of Specimen:  LLL, multiple lymph nodes  DISPOSITION OF SPECIMEN:  Pathology. Bronchial margin negative for cancer  COUNTS CORRECT: YES  DICTATION: .Dragon Dictation  PLAN OF CARE: Admit to inpatient   PATIENT DISPOSITION:  PACU - hemodynamically stable.   Delay start of Pharmacological VTE agent (>24hrs) due to surgical blood loss or risk of bleeding: yes

## 2020-07-12 NOTE — Op Note (Signed)
Natalie [name], Natalie [name] MEDICAL RECORD [id number] ACCOUNT [id number] DATE OF BIRTH:10-14-1960 FACILITY: [facility] LOCATION: [facility] PHYSICIAN:Natalie Carton C. Qusai Kem, MD  OPERATIVE REPORT  DATE OF PROCEDURE:  07/12/2020  PREOPERATIVE DIAGNOSIS:  Non-small cell carcinoma, left lower lobe, clinical stage IB (T2, N0).  POSTOPERATIVE DIAGNOSIS:  Non-small cell carcinoma, left lower lobe, clinical stage IB (T2, N0).  PROCEDURE:   1.  Robotic left video-assisted thoracoscopy. 2.  Left lower lobectomy. 3.  Lymph node dissection. 4.  Intercostal nerve blocks levels 3 through 10.  SURGEON:  Natalie Charon, MD  ASSISTANT:  Natalie Pinks, PA-C.  ANESTHESIA:  General.  FINDINGS:  Mass in left lower lobe.  Multiple enlarged lymph nodes.  Bronchial margin negative for tumor.  CLINICAL NOTE:  Natalie [name] is a 60 year old woman with a history of tobacco abuse who recently was found to have a left lower lobe lung nodule measuring 3.1 x 2.1 cm.  Dr. Patsey Berthold did bronchoscopy with biopsy, which showed non-small-cell carcinoma.   She was referred for surgical resection.  There was a question of a bone lesion on PET/CT.  A needle biopsy was negative for any metastatic disease.  She was offered the option of left lower lobectomy via a robotic approach.  The indications, risks,  benefits, and alternatives were discussed in detail with the patient.  She accepted the risks and wished to proceed.  OPERATIVE NOTE:  Natalie [name] was brought to the preoperative holding area on 07/12/2020.  Anesthesia established intravenous access and placed an arterial blood pressure monitoring line.  She was taken to the operating room, anesthetized and intubated  with a double lumen endotracheal tube.  Intravenous antibiotics were administered.  Foley catheter was placed.  Sequential compression devices were placed on the calves for DVT prophylaxis.  A Bair Hugger was placed for active warming.  She was placed in  a  right lateral decubitus position and the left chest was prepped and draped in the usual sterile fashion.  Single lung ventilation of the left lung was initiated.  A timeout was performed.  A solution containing 20 mL of liposomal bupivacaine, 30 mL of 0.5% bupivacaine and 50 mL of saline was prepared.  This was used for injection at the incisions for local anesthesia as well as for the intercostal nerve blocks.   An incision was made in the eighth interspace in the mid axillary line, an 8 mm port was inserted into the chest.  The thoracoscope was advanced into the chest.  Carbon dioxide was insufflated per protocol.  A 12 mm port was placed in the eighth  interspace 5 cm anterior and a 12 mm Air Seal assistant's port was placed in the tenth interspace centered between the 2 anterior robotic ports.  Intercostal nerve blocks then were performed by inserting a needle from a posterior approach and 10 mL of the bupivacaine  solution was injected into a subpleural plane at each level from the 3rd to the 10th interspace.  Two additional ports were placed in the eighth interspace posterior to the camera port, a 12 mm and an 8 mm further posteriorly.  The robotic instruments were inserted with thoracoscopic visualization.  Dissection was begun.  There were some adhesions of the lung to the diaphragm inferiorly.  These were thin filmy adhesions that were taken down with cautery.  The inferior ligament was divided with bipolar cautery.  All lymph nodes that were encountered  during the dissection were removed and sent for permanent pathology as separate specimens.  The pleural reflection  was divided at the hilum posteriorly and as the dissection was carried out there, there was some cross ventilation of the left lung.   Attempts to reposition the tube were unsuccessful and the endotracheal tube became dislodged.  The robotic instruments were removed and the robot was undocked.  Attempts to replace the tube in the  lateral position were unsuccessful.  An [place] drape was  placed over the incisions.  The patient was placed back in a supine position and then ultimately reintubated.  She did maintain oxygen saturations during the event as she was being hand ventilated with a mask.  Repeated attempts to place a double lumen  tube were unsuccessful and finally decision was made to proceed with a single-lumen tube with a right mainstem placement.  Once the tube was secured, the patient was placed back in a right lateral decubitus position.  The [place] was removed.  The chest  was reprepped and draped.  The ports were reinserted and the robot was redocked.  Dissection then continued on from there, but the procedure was truncated.  The superior part of the hilum and the aortopulmonary window were not opened.  The lower lobe was  retracted superiorly and the inferior pulmonary vein was divided using robotic stapler with a vascular cartridge.  Attention then was turned to the fissure, which was relatively complete.  There were 2 small areas, one posterior and one anterior that  required stapling, but the remainder was dissection of the pleura off the pulmonary artery branches.  There were some enlarged level 12 and level 13 lymph nodes that were encountered during the dissection.  These were removed.  Level 11 nodes were removed  as well.  The basilar trunk of the lower lobe pulmonary artery was dissected out, encircled, and divided with the vascular stapler and then the superior segmental branch was divided separately.  There was a relatively large level 11 node at the takeoff  of the lower lobe bronchus and this node was carefully dissected off the pulmonary artery and the bronchus.  This node did have some necrosis.  The robotic stapler with a green cartridge then was placed across the left lower lobe bronchus and closed.   Opening the left side to ventilation showed some aeration in the left upper lobe.  The stapler then was  fired, transecting the bronchus.  Most of the sponges and the vessel loops were removed.  The lower lobe then was placed into a 12 mm endoscopic  retrieval bag and brought down to the inferior aspect of the chest.  The robot was undocked.  The anterior eighth interspace incision was enlarged to about 3 cm and the lower lobe was removed through that incision and sent for frozen section of the  bronchial margin, which subsequently returned with no tumor seen.  The chest was copiously irrigated with warm saline.  The remainder of the sponges were removed.  A 28-French Blake drain was placed through the original port incision and secured with a  #1 silk suture.  The endotracheal tube was pulled back into the trachea and dual-lung ventilation was resumed.  The incisions were closed with 0 Vicryl fascial sutures and 3-0 Vicryl subcuticular sutures.  Dressings were applied.  The chest tube was  placed to a Pleur-evac on waterseal.  The patient then was placed back in supine position.  She then was transferred to a stretcher and she was taken to the [facility] intubated.  All sponge, needle and instrument counts  were correct at  the end of the procedure.  IN/NUANCE  D:07/12/2020 T:07/12/2020 JOB:[id number]

## 2020-07-12 NOTE — Anesthesia Procedure Notes (Signed)
Arterial Line Insertion Start/End1/24/2022 8:20 AM, 07/12/2020 8:25 AM Performed by: Darral Dash, DO, anesthesiologist  Patient location: Pre-op. Preanesthetic checklist: patient identified, IV checked, site marked, risks and benefits discussed, surgical consent, monitors and equipment checked, pre-op evaluation, timeout performed and anesthesia consent Lidocaine 1% used for infiltration and patient sedated Right, radial was placed Catheter size: 20 G Hand hygiene performed  and maximum sterile barriers used   Attempts: 2 Procedure performed using ultrasound guided technique. Ultrasound Notes:anatomy identified and needle tip was noted to be adjacent to the nerve/plexus identified Following insertion, dressing applied and Biopatch. Post procedure assessment: normal

## 2020-07-12 NOTE — Consult Note (Addendum)
NAME:  Natalie [name], MRN:  [id number], DOB:  08-06-1960, LOS: 0 ADMISSION DATE:  07/12/2020, CONSULTATION DATE:  07/12/20 REFERRING MD:  Roxan Hockey  CHIEF COMPLAINT:  Vent Management   Brief History   Natalie [name] is a 60 y.o. female who was admitted 1/24 for LLL lobectomy in setting of NSCLC.  PCCM asked to assist with post op vent management.  History of present illness   Pt is encephelopathic; therefore, this HPI is obtained from chart review. Natalie [name] is a 60 y.o. female who has a PMH as below, which is significant for DM, COPD, Hepatitis C, schizoaffective disorder, OSA, and hyperlipidemia. She had an admission for DKA in September 2020, during which radiography of the chest incidentally noted lung mas. Workup went on to include ENB of LLL nodule by Dr. Patsey Berthold on 06/07/20, remarkable for NSCLC.  She presented to [facility] 1/24 for robotic assisted thoracoscopy with LLL lobectomy and lymph node dissection.  She remained intubated post op due to hypercapnia; therefore, PCCM asked to see in consultation for vent management.  Past Medical History  has DKA (diabetic ketoacidoses); Primary adenocarcinoma of lower lobe of left lung ([place]); and Status post robot-assisted surgical procedure on their problem list.  [facility] Events   1/24 > admit, LLL lobectomy.  Consults:  PCCM.  Procedures:  ETT 1/24 >  L sided chest tube placed in OR 1/24 > R radial art line 1/24 >  Significant Diagnostic Tests:  None  Micro Data:    Antimicrobials:     Interim history/subjective:    Objective:  Blood pressure 129/65, pulse (!) 110, temperature 97.7 F (36.5 C), resp. rate (!) 21, height 5\' 2"  (1.575 m), weight 102.1 kg, SpO2 97 %.    Vent Mode: CPAP;PSV FiO2 (%):  [50 %] 50 % Set Rate:  [16 bmp] 16 bmp Vt Set:  [400 mL] 400 mL PEEP:  [5 cmH20] 5 cmH20 Pressure Support:  [15 cmH20] 15 cmH20 Plateau Pressure:  [17 cmH20] 17 cmH20   Intake/Output Summary  (Last 24 hours) at 07/12/2020 1548 Last data filed at 07/12/2020 1458 Gross per 24 hour  Intake 1120 ml  Output 950 ml  Net 170 ml   Filed Weights   07/12/20 0656  Weight: 102.1 kg    Examination: General: middle aged female on vent Neuro: Sedated RASS -4.  HEENT: ETT, PERRL, no JVD Cardiovascular: RRR, no MRG Lungs: Clear bilateral breath sounds. L sided chest tube draining bloody fluid.  Abdomen: Soft, non-distended Musculoskeletal: No acute deformirty Skin: Grossly intact.   Assessment & Plan:   Respiratory insufficiency - intubated for OR and remains intubated post op due to hypercapnia. OSA (limited compliance with CPAP therapy),  COPD with acute exacerbation - Extubate - NIMV as needed - Routine bronchial hygiene. - Budesonide / Brovana / Atrovent in lieu of home Trelegy Ellipta. - Solumedrol 125mg  now then 40 mg BID - CPAP QHS once more stable  NSCLC of LLL - s/p lobectomy 1/24 (Dr. Roxan Hockey). - Post op care per TCTS. - Monitor chest tube output.  - Trend H&H post op with bloody CT drainage.  - F/u with oncology as outpatient.  DM: R/o DKA - Insulin infusion and Accucheck per DKA protocol - Send UA and hydroxybutyrate r/o DKA - LR and D5 ordered per protocol - Hold home Novolog, Levemir, Dulaglutide.  HLD. - Continue home atorvastatin.  Schizoaffective disorder, paranoia, depression. - Hold home Benztropine, Citalopram, Gabapentin, Lorazepam, Ziprasidone.   Best Practice (evaluated daily):  Diet:  NPO. Pain/Anxiety/Delirium protocol (if indicated): Propofol, PRN fentanyl VAP protocol (if indicated): In place DVT prophylaxis: SCD's. GI prophylaxis: PPI. Glucose control: DKA protocol Mobility: Bedrest. Disposition: ICU.  Goals of Care:  Last date of multidisciplinary goals of care discussion: None.  Family and staff present: None. Summary of discussion: None. Follow up goals of care discussion due: 1/30. Code Status:  Full.  Labs    CBC: Recent Labs  Lab 07/08/20 1111  WBC 9.4  HGB 12.7  HCT 36.2  MCV 90.7  PLT 176   Basic Metabolic Panel: Recent Labs  Lab 07/08/20 1111  NA 130*  K 4.6  CL 89*  CO2 17*  GLUCOSE 379*  BUN 15  CREATININE 0.85  CALCIUM 9.4   GFR: Estimated Creatinine Clearance: 77.8 mL/min (by C-G formula based on SCr of 0.85 mg/dL). Recent Labs  Lab 07/08/20 1111  WBC 9.4   Liver Function Tests: Recent Labs  Lab 07/08/20 1111 07/12/20 0743  AST 174* 57*  ALT 143* 64*  ALKPHOS 91 79  BILITOT 1.8* 1.0  PROT 7.5 6.4*  ALBUMIN 4.5 3.8   No results for input(s): LIPASE, AMYLASE in the last 168 hours. No results for input(s): AMMONIA in the last 168 hours. ABG    Component Value Date/Time   PHART 7.394 07/08/2020 1300   PCO2ART 31.7 (L) 07/08/2020 1300   PO2ART 86.2 07/08/2020 1300   HCO3 18.9 (L) 07/08/2020 1300   ACIDBASEDEF 5.1 (H) 07/08/2020 1300   O2SAT 95.9 07/08/2020 1300    Coagulation Profile: Recent Labs  Lab 07/08/20 1111  INR 1.2   Cardiac Enzymes: No results for input(s): CKTOTAL, CKMB, CKMBINDEX, TROPONINI in the last 168 hours. HbA1C: Hgb A1c MFr Bld  Date/Time Value Ref Range Status  07/08/2020 01:02 PM 7.7 (H) 4.8 - 5.6 % Final    Comment:    (NOTE) Pre diabetes:          5.7%-6.4%  Diabetes:              >6.4%  Glycemic control for   <7.0% adults with diabetes   01/21/2020 03:23 AM 11.7 (H) 4.8 - 5.6 % Final    Comment:    (NOTE) Pre diabetes:          5.7%-6.4%  Diabetes:              >6.4%  Glycemic control for   <7.0% adults with diabetes    CBG: Recent Labs  Lab 07/08/20 1101 07/08/20 1212 07/12/20 0701 07/12/20 1455  GLUCAP 422* 367* 272* 296*    Review of Systems:   Unable to obtain as pt is encephalopathic.  Past medical history  She,  has a past medical history of Asthma, COPD (chronic obstructive pulmonary disease) ([place]), CVA (cerebral vascular accident) ([place]) (2014), Depression, Diabetes mellitus ([place]),  Diabetes mellitus without complication ([place]), Hepatitis C, History of kidney stones, Hyperlipidemia, Iron deficiency anemia, Obesity, OSA on CPAP, Paranoia ([place]), and Schizoaffective disorder, bipolar type ([place]).   Surgical History    Past Surgical History:  Procedure Laterality Date  . BREAST CYST EXCISION Bilateral 2013   Benign  . CESAREAN SECTION    . CHOLECYSTECTOMY    . COLONOSCOPY    . ESOPHAGOGASTRODUODENOSCOPY  2020   done in [place]  . INGUINAL HERNIA REPAIR Left 2002  . UMBILICAL HERNIA REPAIR    . VIDEO BRONCHOSCOPY WITH ENDOBRONCHIAL NAVIGATION N/A 06/07/2020   Procedure: VIDEO BRONCHOSCOPY WITH ENDOBRONCHIAL NAVIGATION;  Surgeon: Tyler Pita, MD;  Location: [facility]  ORS;  Service: Pulmonary;  Laterality: N/A;     Social History   reports that she quit smoking about 10 years ago. Her smoking use included cigarettes. She has a 76.00 pack-year smoking history. She has quit using smokeless tobacco. She reports current alcohol use. She reports previous drug use.   Family history   Her family history is not on file.   Allergies No Known Allergies   Home meds  Prior to Admission medications   Medication Sig Start Date End Date Taking? Authorizing Provider  acetaminophen (TYLENOL) 650 MG CR tablet Take 650 mg by mouth 2 (two) times daily.   Yes [provider]  atorvastatin (LIPITOR) 40 MG tablet Take 40 mg by mouth at bedtime. 01/15/20  Yes [provider]  benztropine (COGENTIN) 1 MG tablet Take 1 mg by mouth at bedtime. 01/15/20  Yes [provider]  citalopram (CELEXA) 20 MG tablet Take 20 mg by mouth every morning. 01/15/20  Yes [provider]  Dulaglutide (TRULICITY) 1.5 EG/3.1DV SOPN Inject 1.5 mg into the skin every Friday.   Yes [provider]  Fluticasone-Umeclidin-Vilant (TRELEGY ELLIPTA) 100-62.5-25 MCG/INH AEPB Inhale 1 puff into the lungs daily.   Yes [provider]  gabapentin (NEURONTIN) 300 MG capsule Take  300 mg by mouth 2 (two) times daily.   Yes [provider]  insulin aspart (NOVOLOG) 100 UNIT/ML FlexPen Inject 9 Units into the skin 3 (three) times daily with meals. Patient taking differently: Inject 4 Units into the skin 3 (three) times daily with meals. 01/30/20  Yes Ghimire, Dante Gang, MD  insulin detemir (LEVEMIR) 100 UNIT/ML injection Inject 15 Units into the skin 2 (two) times daily.   Yes [provider]  latanoprost (XALATAN) 0.005 % ophthalmic solution Place 1 drop into both eyes daily. 01/15/20  Yes [provider]  lidocaine (LIDODERM) 5 % Place 2 patches onto the skin See admin instructions. Apply 1 patch to each foot every 12 hours 04/13/20  Yes [provider]  LORazepam (ATIVAN) 1 MG tablet Take 1 mg by mouth 2 (two) times daily. 12/17/19  Yes [provider]  ziprasidone (GEODON) 80 MG capsule Take 80 mg by mouth at bedtime.   Yes [provider]  albuterol (VENTOLIN HFA) 108 (90 Base) MCG/ACT inhaler Inhale 2 puffs into the lungs every 6 (six) hours as needed for wheezing or shortness of breath. 04/08/20   [provider]    Critical care time: 46 minutes    Georgann Housekeeper, AGACNP-BC [place] for personal pager PCCM on call pager [phone]  07/12/2020 4:04 PM

## 2020-07-12 NOTE — Anesthesia Postprocedure Evaluation (Signed)
Anesthesia Post Note  Patient: Karma Greaser  Procedure(s) Performed: XI ROBOTIC ASSISTED THORASCOPY-LEFT LOWER LOBECTOMY (Left Chest) LYMPH NODE DISSECTION (Left Chest) INTERCOSTAL NERVE BLOCK (N/A Chest)     Patient location during evaluation: PACU Anesthesia Type: General Level of consciousness: awake and alert, patient cooperative and oriented Pain management: pain level controlled Vital Signs Assessment: post-procedure vital signs reviewed and stable Respiratory status: spontaneous breathing, nonlabored ventilation, respiratory function stable and patient connected to nasal cannula oxygen Cardiovascular status: blood pressure returned to baseline and stable Postop Assessment: no apparent nausea or vomiting Anesthetic complications: no   No complications documented.  Last Vitals:  Vitals:   07/12/20 1730 07/12/20 1800  BP: (!) 141/65 136/61  Pulse: (!) 110 (!) 110  Resp: 15 17  Temp:    SpO2: 98% 97%    Last Pain:  Vitals:   07/12/20 1800  TempSrc:   PainSc: 0-No pain                 JACKSON,E. CARSWELL

## 2020-07-12 NOTE — Interval H&P Note (Signed)
History and Physical Interval Note:  07/12/2020 7:05 AM  Natalie [name]  has presented today for surgery, with the diagnosis of NONSMALL CELL CARCINOMA LEFT LOWER LOBE.  The various methods of treatment have been discussed with the patient and family. After consideration of risks, benefits and other options for treatment, the patient has consented to  Procedure(s): XI ROBOTIC ASSISTED THORASCOPY-LEFT LOWER LOBECTOMY (Left) as a surgical intervention.  The patient's history has been reviewed, patient examined, no change in status, stable for surgery.  I have reviewed the patient's chart and labs.  Questions were answered to the patient's satisfaction.     Melrose Nakayama

## 2020-07-12 NOTE — Anesthesia Procedure Notes (Signed)
Procedure Name: Intubation Date/Time: 07/12/2020 9:07 AM Performed by: Amadeo Garnet, CRNA Pre-anesthesia Checklist: Patient identified, Emergency Drugs available, Suction available and Patient being monitored Patient Re-evaluated:Patient Re-evaluated prior to induction Oxygen Delivery Method: Circle system utilized Preoxygenation: Pre-oxygenation with 100% oxygen Induction Type: IV induction Ventilation: Mask ventilation without difficulty and Oral airway inserted - appropriate to patient size Tube type: Oral Endobronchial tube: Double lumen EBT and EBT position confirmed by fiberoptic bronchoscope and 37 Fr Number of attempts: 1 Airway Equipment and Method: Stylet and Oral airway Placement Confirmation: ETT inserted through vocal cords under direct vision,  positive ETCO2 and breath sounds checked- equal and bilateral Tube secured with: Tape Dental Injury: Teeth and Oropharynx as per pre-operative assessment

## 2020-07-12 NOTE — Treatment Plan (Addendum)
S/p robotic assisted LL lobectomy, lymph node dissection, intercostal nerve block  Ms. Maslowski is alert and oriented x 3, reports some throat pain but tolerating oral liquids.  Denies shortness of breath or wheezing.    Heart regular, lungs mild expiratory wheeze in R lobe and left lobe clear.  No swelling on exam.  Hemodynamically stable w/ HR 100s (sinus tachy on telemetry), respirations regular/even/unlabored, 96% on 2L n/c.    No adjustment in plan for now.

## 2020-07-12 NOTE — Progress Notes (Signed)
Inpatient Diabetes Program Recommendations  AACE/ADA: New Consensus Statement on Inpatient Glycemic Control (2015)  Target Ranges:  Prepandial:   less than 140 mg/dL      Peak postprandial:   less than 180 mg/dL (1-2 hours)      Critically ill patients:  140 - 180 mg/dL   Lab Results  Component Value Date   GLUCAP 296 (H) 07/12/2020   HGBA1C 7.7 (H) 07/08/2020    Review of Glycemic Control  Diabetes history: type 2? Outpatient Diabetes medications: Levemir 15 units BID, Novolog 4 units TID, Trulicity 1.5 mg weekly Current orders for Inpatient glycemic control: IV insulin drip  Inpatient Diabetes Program Recommendations:   Received diabetes coordinator consult for DM management.   Recommend that patient can transition to her home dosage of Levemir 15 units BID, Novolog SENSITIVE correction scale TID & HS scale (0-5 units) especially if patient is eating.   Will continue to monitor blood sugars while in the hospital.   Harvel Ricks RN BSN CDE Diabetes Coordinator Pager: [phone]  8am-5pm

## 2020-07-12 NOTE — Transfer of Care (Signed)
Immediate Anesthesia Transfer of Care Note  Patient: Natalie [name]  Procedure(s) Performed: XI ROBOTIC ASSISTED THORASCOPY-LEFT LOWER LOBECTOMY (Left Chest) LYMPH NODE DISSECTION (Left Chest) INTERCOSTAL NERVE BLOCK (N/A Chest)  Patient Location: PACU  Anesthesia Type:General  Level of Consciousness: Patient remains intubated per anesthesia plan  Airway & Oxygen Therapy: Patient remains intubated per anesthesia plan and Patient placed on Ventilator (see vital sign flow sheet for setting)  Post-op Assessment: Report given to RN and Post -op Vital signs reviewed and stable  Post vital signs: Reviewed and stable  Last Vitals:  Vitals Value Taken Time  BP 140/63 07/12/20 1507  Temp    Pulse 109 07/12/20 1515  Resp 16 07/12/20 1515  SpO2 92 % 07/12/20 1515  Vitals shown include unvalidated device data.  Last Pain:  Vitals:   07/12/20 0814  TempSrc:   PainSc: 0-No pain         Complications: No complications documented.

## 2020-07-12 NOTE — Procedures (Signed)
Extubation Procedure Note  Patient Details:   Name: Natalie [name] DOB: Feb 17, 1961 MRN: [id number]   Airway Documentation:  Airway 8.5 mm (Active)   Vent end date: 07/12/20 Vent end time: 1615   Evaluation  O2 sats: transiently fell during during procedure Complications: No apparent complications Patient did tolerate procedure well. Bilateral Breath Sounds: exp wheezes   Patient extubated per Dr Tamala Julian & placed on 10L Simple Mask. Patient able to cough & speak afterward. Patient with dyspnea & exp wheezes. DuoNeb given & patient breathing better after.  Kathie Dike 07/12/2020, 4:55 PM

## 2020-07-13 ENCOUNTER — Encounter (HOSPITAL_COMMUNITY): Payer: Self-pay | Admitting: Thoracic Surgery (Cardiothoracic Vascular Surgery)

## 2020-07-13 ENCOUNTER — Inpatient Hospital Stay (HOSPITAL_COMMUNITY): Payer: Medicaid Other

## 2020-07-13 LAB — CBC
HCT: 28.3 % — ABNORMAL LOW (ref 36.0–46.0)
Hemoglobin: 9 g/dL — ABNORMAL LOW (ref 12.0–15.0)
MCH: 30.7 pg (ref 26.0–34.0)
MCHC: 31.8 g/dL (ref 30.0–36.0)
MCV: 96.6 fL (ref 80.0–100.0)
Platelets: 128 10*3/uL — ABNORMAL LOW (ref 150–400)
RBC: 2.93 MIL/uL — ABNORMAL LOW (ref 3.87–5.11)
RDW: 17.4 % — ABNORMAL HIGH (ref 11.5–15.5)
WBC: 9 10*3/uL (ref 4.0–10.5)
nRBC: 0.4 % — ABNORMAL HIGH (ref 0.0–0.2)

## 2020-07-13 LAB — POCT I-STAT 7, (LYTES, BLD GAS, ICA,H+H)
Acid-base deficit: 3 mmol/L — ABNORMAL HIGH (ref 0.0–2.0)
Bicarbonate: 28.1 mmol/L — ABNORMAL HIGH (ref 20.0–28.0)
Calcium, Ion: 1.08 mmol/L — ABNORMAL LOW (ref 1.15–1.40)
HCT: 32 % — ABNORMAL LOW (ref 36.0–46.0)
Hemoglobin: 10.9 g/dL — ABNORMAL LOW (ref 12.0–15.0)
O2 Saturation: 87 %
Potassium: 5.1 mmol/L (ref 3.5–5.1)
Sodium: 136 mmol/L (ref 135–145)
TCO2: 31 mmol/L (ref 22–32)
pCO2 arterial: 91 mmHg (ref 32.0–48.0)
pH, Arterial: 7.097 — CL (ref 7.350–7.450)
pO2, Arterial: 75 mmHg — ABNORMAL LOW (ref 83.0–108.0)

## 2020-07-13 LAB — POCT I-STAT, CHEM 8
BUN: 9 mg/dL (ref 8–23)
Calcium, Ion: 1.09 mmol/L — ABNORMAL LOW (ref 1.15–1.40)
Chloride: 97 mmol/L — ABNORMAL LOW (ref 98–111)
Creatinine, Ser: 0.8 mg/dL (ref 0.44–1.00)
Glucose, Bld: 311 mg/dL — ABNORMAL HIGH (ref 70–99)
HCT: 32 % — ABNORMAL LOW (ref 36.0–46.0)
Hemoglobin: 10.9 g/dL — ABNORMAL LOW (ref 12.0–15.0)
Potassium: 5.1 mmol/L (ref 3.5–5.1)
Sodium: 136 mmol/L (ref 135–145)
TCO2: 29 mmol/L (ref 22–32)

## 2020-07-13 LAB — GLUCOSE, CAPILLARY
Glucose-Capillary: 160 mg/dL — ABNORMAL HIGH (ref 70–99)
Glucose-Capillary: 174 mg/dL — ABNORMAL HIGH (ref 70–99)
Glucose-Capillary: 184 mg/dL — ABNORMAL HIGH (ref 70–99)
Glucose-Capillary: 186 mg/dL — ABNORMAL HIGH (ref 70–99)
Glucose-Capillary: 189 mg/dL — ABNORMAL HIGH (ref 70–99)
Glucose-Capillary: 191 mg/dL — ABNORMAL HIGH (ref 70–99)
Glucose-Capillary: 196 mg/dL — ABNORMAL HIGH (ref 70–99)
Glucose-Capillary: 175 mg/dL — ABNORMAL HIGH (ref 70–99)
Glucose-Capillary: 178 mg/dL — ABNORMAL HIGH (ref 70–99)
Glucose-Capillary: 183 mg/dL — ABNORMAL HIGH (ref 70–99)
Glucose-Capillary: 193 mg/dL — ABNORMAL HIGH (ref 70–99)
Glucose-Capillary: 249 mg/dL — ABNORMAL HIGH (ref 70–99)
Glucose-Capillary: 252 mg/dL — ABNORMAL HIGH (ref 70–99)
Glucose-Capillary: 214 mg/dL — ABNORMAL HIGH (ref 70–99)
Glucose-Capillary: 238 mg/dL — ABNORMAL HIGH (ref 70–99)

## 2020-07-13 LAB — BASIC METABOLIC PANEL
Anion gap: 13 (ref 5–15)
BUN: <5 mg/dL — ABNORMAL LOW (ref 8–23)
CO2: 25 mmol/L (ref 22–32)
Calcium: 7.3 mg/dL — ABNORMAL LOW (ref 8.9–10.3)
Chloride: 99 mmol/L (ref 98–111)
Creatinine, Ser: 0.83 mg/dL (ref 0.44–1.00)
GFR, Estimated: >60 mL/min (ref >60)
Glucose, Bld: 183 mg/dL — ABNORMAL HIGH (ref 70–99)
Potassium: 3.6 mmol/L (ref 3.5–5.1)
Sodium: 137 mmol/L (ref 135–145)

## 2020-07-13 IMAGING — DX DG CHEST 1V PORT
1 series · 1 of 1 positions shown · non-contrast
Comparison: [DATE].

CLINICAL DATA: Chest tube.

EXAM:
PORTABLE CHEST 1 VIEW

[chest]
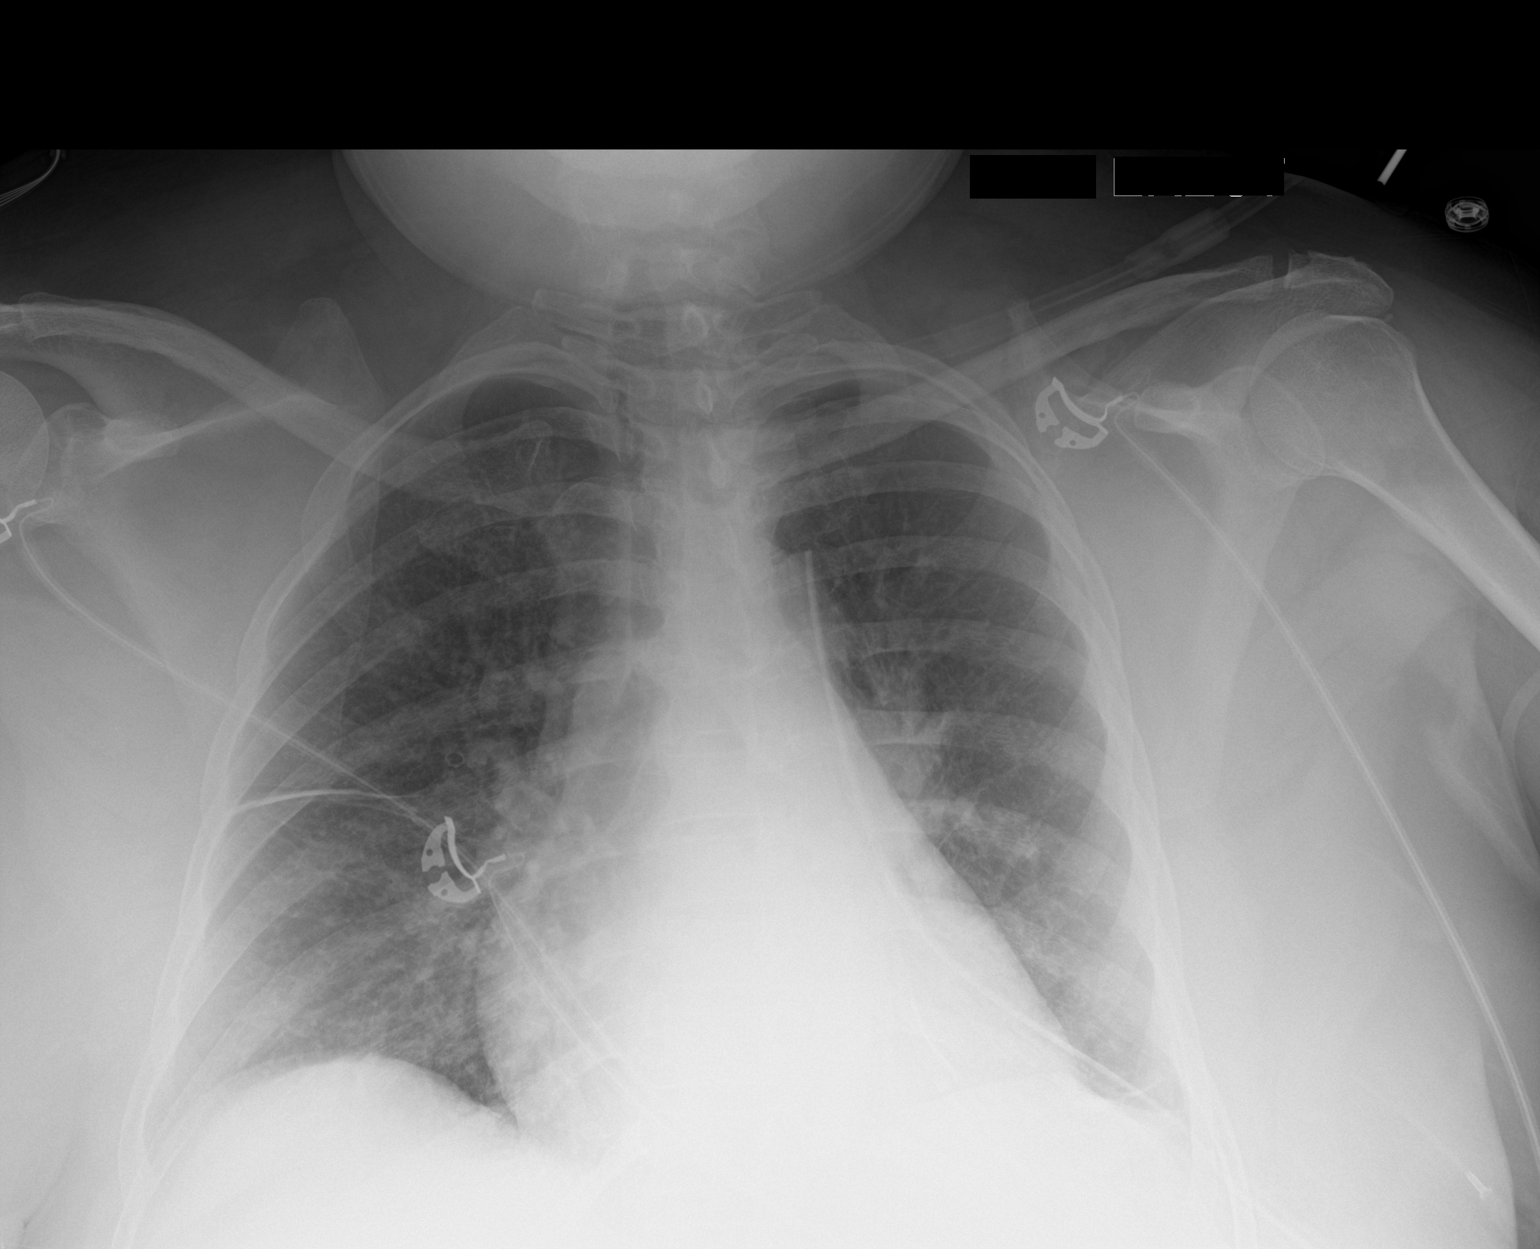

[1 of 1 positions shown; findings below may reference images not displayed]

FINDINGS: Interim extubation. Left chest tube in stable position. No definite
pneumothorax noted on today's exam. Interim resolution of right
upper lung atelectasis. Low lung volumes. Mild right lung
interstitial prominence. No prominent pleural effusion. Heart size
stable.
IMPRESSION: 1. Interim extubation. Left chest tube in stable position. No
definite pneumothorax noted on today's exam.
2. Interim resolution of right upper lung atelectasis. Low lung
volumes. Mild right lung interstitial prominence.

## 2020-07-13 MED ORDER — INSULIN ASPART 100 UNIT/ML ~~LOC~~ SOLN
0.0000 [IU] | Freq: Three times a day (TID) | SUBCUTANEOUS | Status: DC
  Administered 2020-07-13 (×2): 7 [IU] via SUBCUTANEOUS
  Administered 2020-07-14: 3 [IU] via SUBCUTANEOUS
  Administered 2020-07-14: 4 [IU] via SUBCUTANEOUS
  Administered 2020-07-14: 7 [IU] via SUBCUTANEOUS

## 2020-07-13 MED ORDER — POTASSIUM CHLORIDE CRYS ER 20 MEQ PO TBCR
20.0000 meq | EXTENDED_RELEASE_TABLET | ORAL | Status: AC
  Administered 2020-07-13 (×3): 20 meq via ORAL
  Filled 2020-07-13: qty 1

## 2020-07-13 MED ORDER — KETOROLAC TROMETHAMINE 15 MG/ML IJ SOLN
15.0000 mg | Freq: Four times a day (QID) | INTRAMUSCULAR | Status: AC
  Administered 2020-07-13 – 2020-07-15 (×8): 15 mg via INTRAVENOUS
  Filled 2020-07-13 (×8): qty 1

## 2020-07-13 MED ORDER — INFLUENZA VAC SPLIT QUAD 0.5 ML IM SUSY
0.5000 mL | PREFILLED_SYRINGE | INTRAMUSCULAR | Status: AC
  Administered 2020-07-15: 0.5 mL via INTRAMUSCULAR
  Filled 2020-07-13: qty 0.5

## 2020-07-13 MED ORDER — INSULIN DETEMIR 100 UNIT/ML ~~LOC~~ SOLN
15.0000 [IU] | Freq: Two times a day (BID) | SUBCUTANEOUS | Status: DC
  Administered 2020-07-13 (×2): 15 [IU] via SUBCUTANEOUS
  Filled 2020-07-13 (×4): qty 0.15

## 2020-07-13 MED ORDER — INSULIN ASPART 100 UNIT/ML ~~LOC~~ SOLN
0.0000 [IU] | Freq: Every day | SUBCUTANEOUS | Status: DC
  Administered 2020-07-13 – 2020-07-14 (×2): 2 [IU] via SUBCUTANEOUS

## 2020-07-13 NOTE — Progress Notes (Signed)
1 Day Post-Op Procedure(s) (LRB): XI ROBOTIC ASSISTED THORASCOPY-LEFT LOWER LOBECTOMY (Left) LYMPH NODE DISSECTION (Left) INTERCOSTAL NERVE BLOCK (N/A) Subjective: Some incisional pain, also sore throat  Objective: Vital signs in last 24 hours: Temp:  [97 F (36.1 C)-99 F (37.2 C)] 99 F (37.2 C) (01/24 2113) Pulse Rate:  [94-115] 97 (01/25 0700) Cardiac Rhythm: Normal sinus rhythm (01/25 0400) Resp:  [12-33] 17 (01/25 0700) BP: (101-157)/(55-88) 145/78 (01/25 0700) SpO2:  [90 %-100 %] 98 % (01/25 0700) Arterial Line BP: (77-211)/(50-96) 106/96 (01/25 0600) FiO2 (%):  [50 %] 50 % (01/24 1522)  Hemodynamic parameters for last 24 hours:    Intake/Output from previous day: 01/24 0701 - 01/25 0700 In: 4187.3 [P.O.:360; I.V.:3607.3; IV Piggyback:220] Out: 3017 [Urine:2570; Blood:200; Chest Tube:247] Intake/Output this shift: No intake/output data recorded.  General appearance: alert, cooperative and no distress Neurologic: intact Heart: regular rate and rhythm Lungs: diminished breath sounds left abse Abdomen: soft, nontender no air leak  Lab Results: Recent Labs    07/12/20 1542 07/12/20 2022 07/13/20 0500  WBC 16.6*  --  9.0  HGB 10.8* 10.3* 9.0*  HCT 35.2* 31.0* 28.3*  PLT 196  --  128*   BMET:  Recent Labs    07/12/20 1542 07/13/20 0500  NA 136 137  K 4.5 3.6  CL 99 99  CO2 24 25  GLUCOSE 228* 183*  BUN 8 <5*  CREATININE 0.99 0.83  CALCIUM 7.8* 7.3*    PT/INR: No results for input(s): LABPROT, INR in the last 72 hours. ABG    Component Value Date/Time   PHART 7.137 (LL) 07/12/2020 1530   HCO3 25.7 07/12/2020 1530   ACIDBASEDEF 2.4 (H) 07/12/2020 1530   O2SAT 94.6 07/12/2020 1530   CBG (last 3)  Recent Labs    07/13/20 0056 07/13/20 0157 07/13/20 0300  GLUCAP 189* 196* 174*    Assessment/Plan: S/P Procedure(s) (LRB): XI ROBOTIC ASSISTED THORASCOPY-LEFT LOWER LOBECTOMY (Left) LYMPH NODE DISSECTION (Left) INTERCOSTAL NERVE BLOCK  (N/A) -POD # 1 CV- in SR, BP mildly elevated RESP- s/p left lower lobectomy  No air leak- CT to water seal  IS, bronchodilators RENAL- creatinine and lytes Ok, supplement K ENDO- CBG elevated, transition off endotool GI- advance diet SCD for DVT prophylaxis- will add enoxaparin Ambulate   LOS: 1 day    [place] Nakayama 07/13/2020

## 2020-07-13 NOTE — Progress Notes (Signed)
Patient appears stable off vent, please let us know if we can be of further help.  Erskine Emery MD PCCM

## 2020-07-13 NOTE — Hospital Course (Addendum)
Hospital Course: Patient remained afebrile and hemodynamically stable. She was extubated the afternoon of surgery.  She had complaints of incisional pain and a sore throat on post op day one. A line and foley were removed early in her post operative course. She was started on Lovenox for DVT prophylaxis. Chest tube was to water seal and there was no air leak. Daily chest x rays were taken and remained stable. Chest tube was removed on 01/**.

## 2020-07-13 NOTE — Discharge Summary (Addendum)
Physician Discharge Summary       [address]       ,[place] [address]             [phone]    Patient ID: Natalie [name] MRN: [id number] DOB/AGE: Apr 12, 1961 60 y.o.  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 COPD (chronic obstructive pulmonary disease) ([place])   . CVA (cerebral vascular accident) ([place]) 2014  . Depression   . Diabetes mellitus ([place])   . Diabetes mellitus without complication ([place])   . Hepatitis C   . History of kidney stones   . Hyperlipidemia   . Iron deficiency anemia   . Obesity   . OSA on CPAP    Mild, noncompliant with CPAP said cpap machine was recalled, is waiting for a replacement   . Paranoia ([place])   . Schizoaffective disorder, bipolar type ([place])    Procedure (s):  1.  Robotic left video-assisted thoracoscopy. 2.  Left lower lobectomy. 3.  Lymph node dissection. 4.  Intercostal nerve blocks at levels 3 through 10 by Dr. Roxan Hockey on 07/12/2020.    History of Presenting Illness: Natalie [name] is a 60 year old former smoker with a history of tobacco abuse, asthma, COPD, CVA, insulin-dependent diabetes complicated by neuropathy, hepatitis C, hyperlipidemia, obesity, sleep apnea, and schizoaffective disorder.  She smoked about 2 packs of cigarettes daily before quitting in 2012.  Back in the fall she was admitted to the hospital with a diabetic coma.  She was treated for severe DKA in the ICU.  She ultimately was discharged after about 10 days.  She was noted to have a shadow in the left lung on her chest x-ray.  She had a CT on 05/03/2020 which showed a 3.1 x 2.1 cm  left lower lobe lung mass.  There was some other lung densities as well.  There was no adenopathy.  She had a PET/CT which showed the mass was hypermetabolic.  There also was some hypermetabolic activity in the right iliac.  A CT-guided biopsy of the right iliac showed no malignancy.  She had a navigational bronchoscopy by Dr. Patsey Berthold.  Biopsies of the left lower lobe lung mass showed non-small cell carcinoma.  She quit smoking in 2012.  She has not had any change in appetite but has gained significant amount of weight over the past 3 months.  She attributes this to having to eat 3 times a day with her insulin.  She walks with a cane due to peripheral neuropathy.  She also is on Neurontin for that.  She does get short of breath with activity.  She denies any chest pain, pressure, or tightness.  Dr. Roxan Hockey discussed the need for [facility] robotic assisted left lower lobectomy, lymph node dissection, and intercostal nerve block. Potential risks, complications, and benefits of the surgery were discussed with the patient and she agreed to proceed with surgery.  Hospital Course: Patient remained afebrile and hemodynamically stable. She was extubated the afternoon of surgery.  She had complaints of incisional pain and a sore throat on post op day one. A line and foley were removed early in her post operative course. She was started on Lovenox for DVT prophylaxis. Chest tube was to water  seal and there was no air leak. Daily chest x rays were taken and remained stable. Chest tube was removed on 07/14/2020.  She was medically stable for transfer to the progressive care unit.  Her follow up CXR showed no evidence of pneumothorax.  Bibasilar atelectasis remained present.  She was ambulating around the unit without difficulty.  She was weaned off her oxygen prior to discharge.  Her surgical incisions are healing without evidence of infection.  She is felt to be medically stable for discharge home today.  Latest  Vital Signs: Blood pressure (!) 103/59, pulse 84, temperature 97.7 F (36.5 C), temperature source Oral, resp. rate 18, height 5\' 2"  (1.575 m), weight 106.2 kg, SpO2 95 %.  Physical Exam:  General appearance: alert, cooperative and no distress Heart: regular rate and rhythm Lungs: clear to auscultation bilaterally Abdomen: soft, non-tender; bowel sounds normal; no masses,  no organomegaly Extremities: extremities normal, atraumatic, no cyanosis or edema Wound: clean and dry  Discharge Condition: Stable and discharged to home.  Recent laboratory studies:  Lab Results  Component Value Date   WBC 6.7 07/14/2020   HGB 8.5 (L) 07/14/2020   HCT 26.4 (L) 07/14/2020   MCV 97.1 07/14/2020   PLT 116 (L) 07/14/2020   Lab Results  Component Value Date   NA 137 07/14/2020   K 3.7 07/14/2020   CL 102 07/14/2020   CO2 23 07/14/2020   CREATININE 0.98 07/14/2020   GLUCOSE 204 (H) 07/14/2020      Diagnostic Studies: DG Chest 2 View  Result Date: 07/15/2020 CLINICAL DATA:  Status post left lower lobectomy. EXAM: CHEST - 2 VIEW COMPARISON:  07/14/2020 FINDINGS: Subtle left-sided volume loss and left basilar atelectasis again noted. No confluent pulmonary infiltrate. No pneumothorax or pleural effusion. Cardiac size within normal limits. Pulmonary vascularity is normal. IMPRESSION: Left-sided volume loss.  No pneumothorax. Electronically Signed   By: Fidela Salisbury MD   On: 07/15/2020 06:24   DG Chest 2 View  Result Date: 07/08/2020 CLINICAL DATA:  Preoperative EXAM: CHEST - 2 VIEW COMPARISON:  None. FINDINGS: The heart size and mediastinal contours are within normal limits. There is a mass of the superior segment left lower lobe measuring 4.1 cm in projection. The visualized skeletal structures are unremarkable. IMPRESSION: 1.  No acute abnormality of the lungs. 2. There is a mass of the superior segment left lower lobe measuring 4.1 cm in projection. Electronically Signed   By: Eddie Candle  M.D.   On: 07/08/2020 12:42   DG Chest Port 1 View  Result Date: 07/14/2020 CLINICAL DATA:  Chest tube removal. EXAM: PORTABLE CHEST 1 VIEW COMPARISON:  07/14/2020. FINDINGS: Interim removal of left chest tube. No pneumothorax. Mediastinum and hilar structures normal. Stable borderline cardiomegaly. Mild left base atelectasis. Mild right lung interstitial prominence again noted. IMPRESSION: 1. Interim removal of left chest tube. No pneumothorax. Mild left base atelectasis. 2.  Mild right lung interstitial prominence again noted. 3. Stable borderline cardiomegaly.  No pulmonary venous congestion. Electronically Signed   By: Marcello Moores  Register   On: 07/14/2020 08:50   DG Chest Port 1 View  Result Date: 07/14/2020 CLINICAL DATA:  Chest tube.  History of lobectomy. EXAM: PORTABLE CHEST 1 VIEW COMPARISON:  07/13/2020. FINDINGS: Left chest tube in stable position. No pneumothorax. Low lung volumes with mild bibasilar atelectasis. Stable mild interstitial prominence right lung. No pleural effusion. Right costophrenic angle incompletely imaged. Stable borderline cardiomegaly. IMPRESSION: 1. Left chest tube in stable position. No pneumothorax. 2.  Low lung volumes with mild bibasilar atelectasis. Stable mild interstitial prominence right lung. 3. Stable borderline cardiomegaly. Electronically Signed   By: Marcello Moores  Register   On: 07/14/2020 06:39   DG CHEST PORT 1 VIEW  Result Date: 07/13/2020 CLINICAL DATA:  Chest tube. EXAM: PORTABLE CHEST 1 VIEW COMPARISON:  07/12/2020. FINDINGS: Interim extubation. Left chest tube in stable position. No definite pneumothorax noted on today's exam. Interim resolution of right upper lung atelectasis. Low lung volumes. Mild right lung interstitial prominence. No prominent pleural effusion. Heart size stable. IMPRESSION: 1. Interim extubation. Left chest tube in stable position. No definite pneumothorax noted on today's exam. 2. Interim resolution of right upper lung atelectasis. Low  lung volumes. Mild right lung interstitial prominence. Electronically Signed   By: Marcello Moores  Register   On: 07/13/2020 06:23   DG Chest Port 1 View  Result Date: 07/12/2020 CLINICAL DATA:  Postoperative for lobectomy EXAM: PORTABLE CHEST 1 VIEW COMPARISON:  07/08/2020 chest radiograph. FINDINGS: Left apical chest tube in place. Endotracheal tube tip is 2.8 cm above the carina. Stable cardiomediastinal silhouette with normal heart size. Tiny left apical pneumothorax, less than 5%. No right pneumothorax. No pleural effusion. Mild bibasilar atelectasis. Complete right upper lobe atelectasis. IMPRESSION: 1. Tiny left apical pneumothorax, less than 5%. Left apical chest tube in place. 2. Well-positioned endotracheal tube. 3. Complete right upper lobe atelectasis. Follow-up chest radiographs suggested. 4. Mild bibasilar atelectasis. Electronically Signed   By: Ilona Sorrel M.D.   On: 07/12/2020 15:23   Pulmonary Function Test [facility] Only  Result Date: 06/24/2020 Spirometry Data Is Acceptable and Reproducible Moderate Obstructive Airways Disease with Significant Broncho-Dilator Response Consider outpatient Pulmonary Consultation if needed Clinical Correlation Advised    Discharge Medications: Allergies as of 07/15/2020   No Known Allergies     Medication List    TAKE these medications   acetaminophen 650 MG CR tablet Commonly known as: TYLENOL Take 650 mg by mouth 2 (two) times daily.   albuterol 108 (90 Base) MCG/ACT inhaler Commonly known as: VENTOLIN HFA Inhale 2 puffs into the lungs every 6 (six) hours as needed for wheezing or shortness of breath.   atorvastatin 40 MG tablet Commonly known as: LIPITOR Take 40 mg by mouth at bedtime.   benztropine 1 MG tablet Commonly known as: COGENTIN Take 1 mg by mouth at bedtime.   citalopram 20 MG tablet Commonly known as: CELEXA Take 20 mg by mouth every morning.   gabapentin 300 MG capsule Commonly known as: NEURONTIN Take 300 mg by mouth 2  (two) times daily.   insulin aspart 100 UNIT/ML FlexPen Commonly known as: NOVOLOG Inject 9 Units into the skin 3 (three) times daily with meals. What changed: how much to take   insulin detemir 100 UNIT/ML injection Commonly known as: LEVEMIR Inject 15 Units into the skin 2 (two) times daily.   latanoprost 0.005 % ophthalmic solution Commonly known as: XALATAN Place 1 drop into both eyes daily.   lidocaine 5 % Commonly known as: LIDODERM Place 2 patches onto the skin See admin instructions. Apply 1 patch to each foot every 12 hours   LORazepam 1 MG tablet Commonly known as: ATIVAN Take 1 mg by mouth 2 (two) times daily.   oxyCODONE 5 MG immediate release tablet Commonly known as: Oxy IR/ROXICODONE Take 1-2 tablets (5-10 mg total) by mouth every 4 (four) hours as needed for moderate pain.   Trelegy Ellipta 100-62.5-25 MCG/INH Aepb Generic drug: Fluticasone-Umeclidin-Vilant Inhale 1 puff into the lungs daily.  Trulicity 1.5 KK/9.3GH Sopn Generic drug: Dulaglutide Inject 1.5 mg into the skin every Friday.   ziprasidone 80 MG capsule Commonly known as: GEODON Take 80 mg by mouth at bedtime.       Follow Up Appointments:  Follow-up Information    Melrose Nakayama, MD. Go on 07/27/2020.   Specialty: Cardiothoracic Surgery Why: Appointment is at 4:00, please obtain CXR 30 min prior to your appointment at [facility] which is located on first floor of our office building Contact information: [place] [address] [phone]               Signed:  Ellamae Sia 07/15/2020, 7:20 AM

## 2020-07-13 NOTE — Progress Notes (Signed)
      [address]       [place] [address]             [phone]      No complaints this evening  BP (!) 112/54   Pulse (!) 104   Temp 98.9 F (37.2 C) (Oral)   Resp 18   Ht 5\' 2"  (1.575 m)   Wt 102.1 kg   SpO2 99%   BMI 41.15 kg/m  2L [place] 98% sat  Intake/Output Summary (Last 24 hours) at 07/13/2020 1844 Last data filed at 07/13/2020 1800 Gross per 24 hour  Intake 4214 ml  Output 3232 ml  Net 982 ml   CBG elevated- levemir + SSI  Ambulated length of hallway twice today  Remo Lipps C. Roxan Hockey, MD [facility] [phone]

## 2020-07-14 ENCOUNTER — Inpatient Hospital Stay (HOSPITAL_COMMUNITY): Payer: Medicaid Other

## 2020-07-14 LAB — COMPREHENSIVE METABOLIC PANEL
ALT: 34 U/L (ref 0–44)
AST: 49 U/L — ABNORMAL HIGH (ref 15–41)
Albumin: 2.8 g/dL — ABNORMAL LOW (ref 3.5–5.0)
Alkaline Phosphatase: 43 U/L (ref 38–126)
Anion gap: 12 (ref 5–15)
BUN: 5 mg/dL — ABNORMAL LOW (ref 8–23)
CO2: 23 mmol/L (ref 22–32)
Calcium: 6.9 mg/dL — ABNORMAL LOW (ref 8.9–10.3)
Chloride: 102 mmol/L (ref 98–111)
Creatinine, Ser: 0.98 mg/dL (ref 0.44–1.00)
GFR, Estimated: >60 mL/min (ref >60)
Glucose, Bld: 204 mg/dL — ABNORMAL HIGH (ref 70–99)
Potassium: 3.7 mmol/L (ref 3.5–5.1)
Sodium: 137 mmol/L (ref 135–145)
Total Bilirubin: 0.7 mg/dL (ref 0.3–1.2)
Total Protein: 4.9 g/dL — ABNORMAL LOW (ref 6.5–8.1)

## 2020-07-14 LAB — CBC
HCT: 26.4 % — ABNORMAL LOW (ref 36.0–46.0)
Hemoglobin: 8.5 g/dL — ABNORMAL LOW (ref 12.0–15.0)
MCH: 31.3 pg (ref 26.0–34.0)
MCHC: 32.2 g/dL (ref 30.0–36.0)
MCV: 97.1 fL (ref 80.0–100.0)
Platelets: 116 10*3/uL — ABNORMAL LOW (ref 150–400)
RBC: 2.72 MIL/uL — ABNORMAL LOW (ref 3.87–5.11)
RDW: 18.1 % — ABNORMAL HIGH (ref 11.5–15.5)
WBC: 6.7 10*3/uL (ref 4.0–10.5)
nRBC: 0.6 % — ABNORMAL HIGH (ref 0.0–0.2)

## 2020-07-14 LAB — GLUCOSE, CAPILLARY
Glucose-Capillary: 222 mg/dL — ABNORMAL HIGH (ref 70–99)
Glucose-Capillary: 165 mg/dL — ABNORMAL HIGH (ref 70–99)
Glucose-Capillary: 137 mg/dL — ABNORMAL HIGH (ref 70–99)
Glucose-Capillary: 221 mg/dL — ABNORMAL HIGH (ref 70–99)

## 2020-07-14 IMAGING — DX DG CHEST 1V PORT
1 series · 1 of 1 positions shown · non-contrast
Comparison: [DATE].

CLINICAL DATA: Chest tube.  History of lobectomy.

EXAM:
PORTABLE CHEST 1 VIEW

[chest]
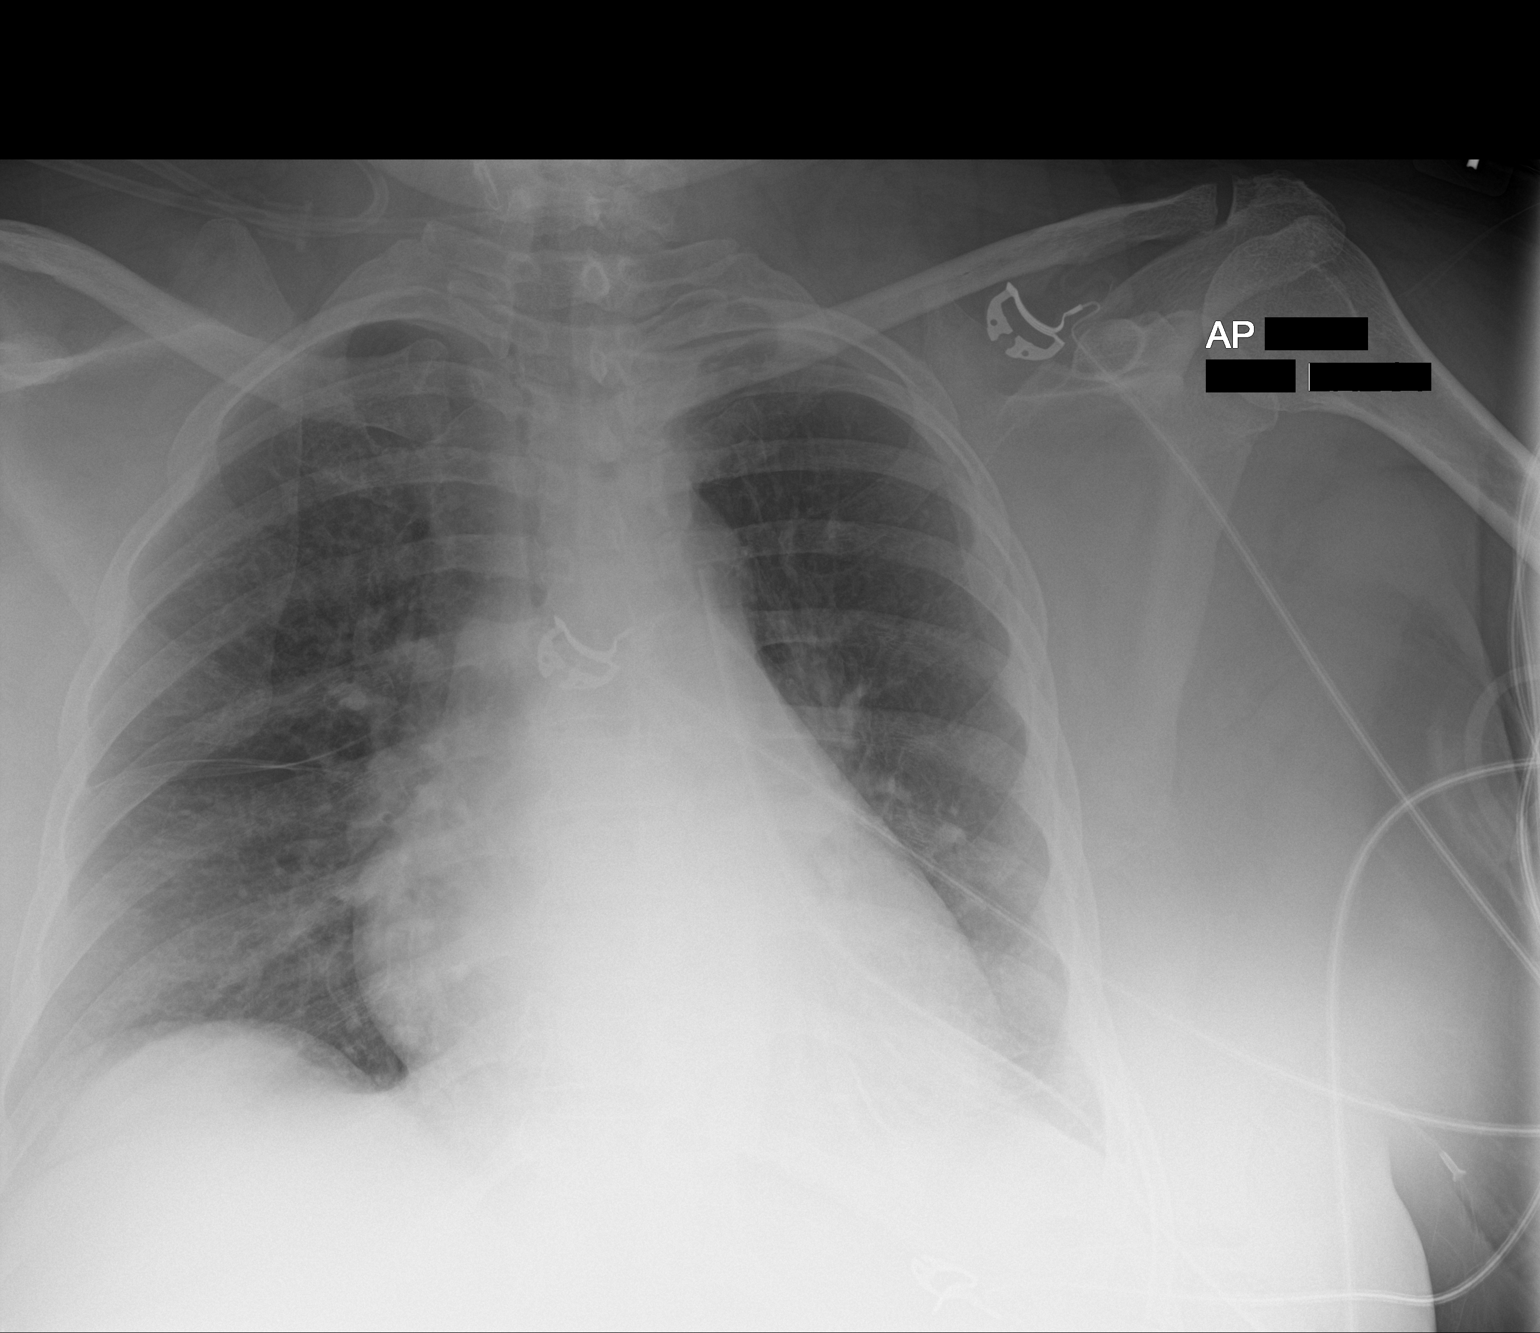

[1 of 1 positions shown; findings below may reference images not displayed]

FINDINGS: Left chest tube in stable position. No pneumothorax. Low lung
volumes with mild bibasilar atelectasis. Stable mild interstitial
prominence right lung. No pleural effusion. Right costophrenic angle
incompletely imaged. Stable borderline cardiomegaly.
IMPRESSION: 1. Left chest tube in stable position. No pneumothorax.
2. Low lung volumes with mild bibasilar atelectasis. Stable mild
interstitial prominence right lung.
3. Stable borderline cardiomegaly.

## 2020-07-14 IMAGING — DX DG CHEST 1V PORT
1 series · 1 of 1 positions shown · non-contrast
Comparison: [DATE].

CLINICAL DATA: Chest tube removal.

EXAM:
PORTABLE CHEST 1 VIEW

[chest ap]
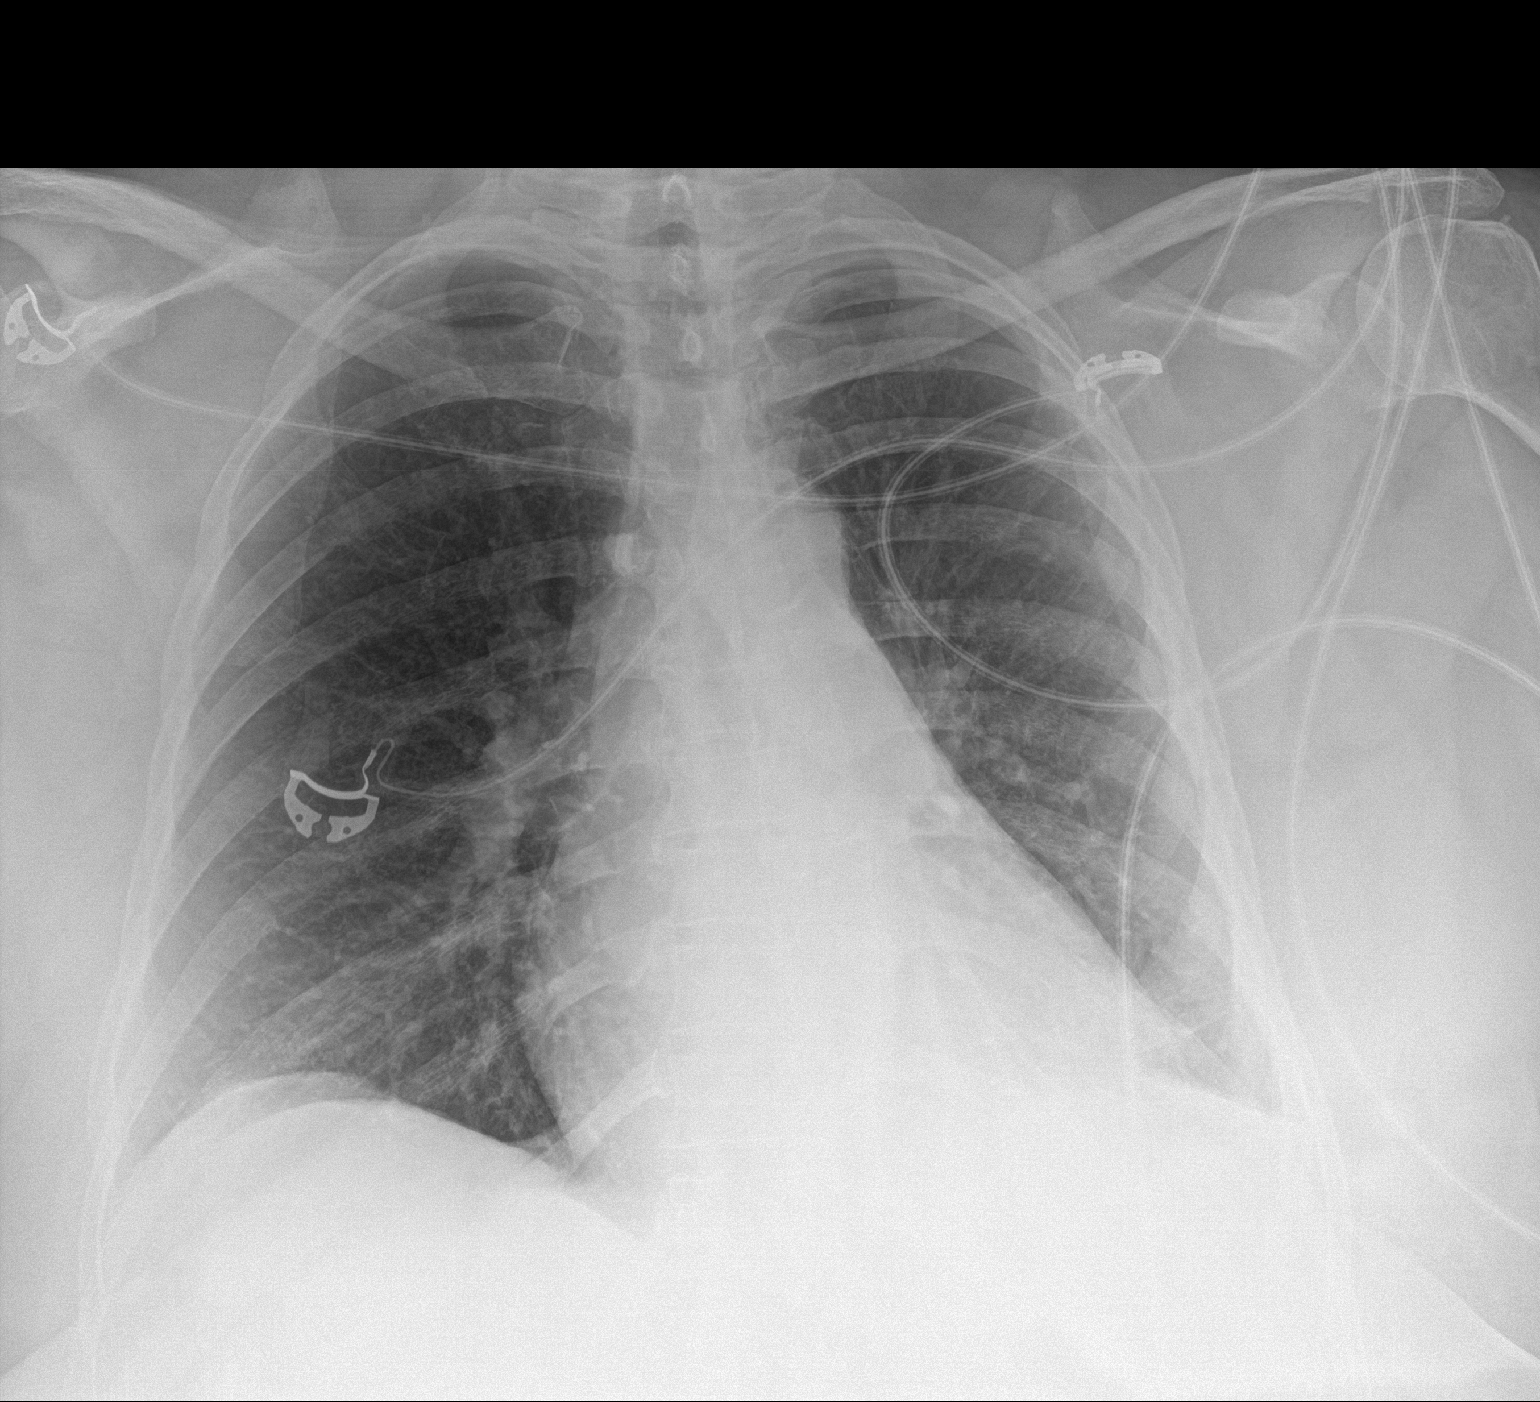

[1 of 1 positions shown; findings below may reference images not displayed]

FINDINGS: Interim removal of left chest tube. No pneumothorax. Mediastinum and
hilar structures normal. Stable borderline cardiomegaly. Mild left
base atelectasis. Mild right lung interstitial prominence again
noted.
IMPRESSION: 1. Interim removal of left chest tube. No pneumothorax. Mild left
base atelectasis.
2.  Mild right lung interstitial prominence again noted.
3. Stable borderline cardiomegaly.  No pulmonary venous congestion.

## 2020-07-14 MED ORDER — INSULIN DETEMIR 100 UNIT/ML ~~LOC~~ SOLN
25.0000 [IU] | Freq: Two times a day (BID) | SUBCUTANEOUS | Status: DC
  Administered 2020-07-14 – 2020-07-15 (×3): 25 [IU] via SUBCUTANEOUS
  Filled 2020-07-14 (×5): qty 0.25

## 2020-07-14 MED ORDER — ORAL CARE MOUTH RINSE
15.0000 mL | Freq: Two times a day (BID) | OROMUCOSAL | Status: DC
  Administered 2020-07-14 (×2): 15 mL via OROMUCOSAL

## 2020-07-14 MED ORDER — POTASSIUM CHLORIDE CRYS ER 20 MEQ PO TBCR
20.0000 meq | EXTENDED_RELEASE_TABLET | ORAL | Status: AC
  Administered 2020-07-14 (×3): 20 meq via ORAL
  Filled 2020-07-14 (×3): qty 1

## 2020-07-14 NOTE — Progress Notes (Signed)
2 Days Post-Op Procedure(s) (LRB): XI ROBOTIC ASSISTED THORASCOPY-LEFT LOWER LOBECTOMY (Left) LYMPH NODE DISSECTION (Left) INTERCOSTAL NERVE BLOCK (N/A) Subjective: No complaints at present. Already walked this AM  Objective: Vital signs in last 24 hours: Temp:  [98.2 F (36.8 C)-98.9 F (37.2 C)] 98.5 F (36.9 C) (01/26 0500) Pulse Rate:  [79-114] 107 (01/26 0700) Cardiac Rhythm: Normal sinus rhythm (01/26 0445) Resp:  [8-28] 22 (01/26 0700) BP: (99-143)/(54-95) 104/64 (01/26 0631) SpO2:  [89 %-100 %] 100 % (01/26 0700) Weight:  [106.2 kg] 106.2 kg (01/26 0600)  Hemodynamic parameters for last 24 hours:    Intake/Output from previous day: 01/25 0701 - 01/26 0700 In: 1817.6 [P.O.:1110; I.V.:707.6] Out: 1330 [Urine:1060; Chest Tube:270] Intake/Output this shift: No intake/output data recorded.  General appearance: alert, cooperative and no distress Neurologic: intact Heart: regular rate and rhythm Lungs: diminished breath sounds left base no air leak  Lab Results: Recent Labs    07/13/20 0500 07/14/20 0130  WBC 9.0 6.7  HGB 9.0* 8.5*  HCT 28.3* 26.4*  PLT 128* 116*   BMET:  Recent Labs    07/13/20 0500 07/14/20 0130  NA 137 137  K 3.6 3.7  CL 99 102  CO2 25 23  GLUCOSE 183* 204*  BUN <5* 5*  CREATININE 0.83 0.98  CALCIUM 7.3* 6.9*    PT/INR: No results for input(s): LABPROT, INR in the last 72 hours. ABG    Component Value Date/Time   PHART 7.137 (LL) 07/12/2020 1530   HCO3 25.7 07/12/2020 1530   TCO2 31 07/12/2020 1353   ACIDBASEDEF 2.4 (H) 07/12/2020 1530   O2SAT 94.6 07/12/2020 1530   CBG (last 3)  Recent Labs    07/13/20 1530 07/13/20 2117 07/14/20 0646  GLUCAP 214* 238* 222*    Assessment/Plan: S/P Procedure(s) (LRB): XI ROBOTIC ASSISTED THORASCOPY-LEFT LOWER LOBECTOMY (Left) LYMPH NODE DISSECTION (Left) INTERCOSTAL NERVE BLOCK (N/A) -POD # 2 Doing well  No air leak- dc chest tube Only on 1L [place]- wean as tolerated CBG elevated-  increase levemir, continue SSI Transfer to step down    LOS: 2 days    Melrose Nakayama 07/14/2020

## 2020-07-14 NOTE — Progress Notes (Signed)
Inpatient Diabetes Program Recommendations  AACE/ADA: New Consensus Statement on Inpatient Glycemic Control (2015)  Target Ranges:  Prepandial:   less than 140 mg/dL      Peak postprandial:   less than 180 mg/dL (1-2 hours)      Critically ill patients:  140 - 180 mg/dL   Lab Results  Component Value Date   GLUCAP 222 (H) 07/14/2020   HGBA1C 7.7 (H) 07/08/2020    Review of Glycemic Control  Diabetes history: DM2 Outpatient Diabetes medications: Levemir 15 BID, Novolog 4 units TID, Trulicity 1.5 mg weekly Current orders for Inpatient glycemic control: Lantus 15 units BID, Novolog 0-20 units TID with meals and 0-5 HS  HgbA1C - 7.7%  Inpatient Diabetes Program Recommendations:     Levemir has been increased to 25 units BID. Would recommend 18 units BID with FBS at 222 mg/dL.  Consider adding meal-coverage insulin - Novolog 3 units TID with meals if eating > 50% meal.  Will continue to follow.  Thank you. Lorenda Peck, RD, LDN, CDE Inpatient Diabetes Coordinator [phone]

## 2020-07-15 ENCOUNTER — Inpatient Hospital Stay (HOSPITAL_COMMUNITY): Payer: Medicaid Other

## 2020-07-15 LAB — GLUCOSE, CAPILLARY: Glucose-Capillary: 106 mg/dL — ABNORMAL HIGH (ref 70–99)

## 2020-07-15 LAB — SURGICAL PATHOLOGY

## 2020-07-15 IMAGING — CR DG CHEST 2V
2 series · 2 of 2 positions shown · non-contrast
Comparison: [DATE]

CLINICAL DATA: Status post left lower lobectomy.

EXAM:
CHEST - 2 VIEW

[chest pa]
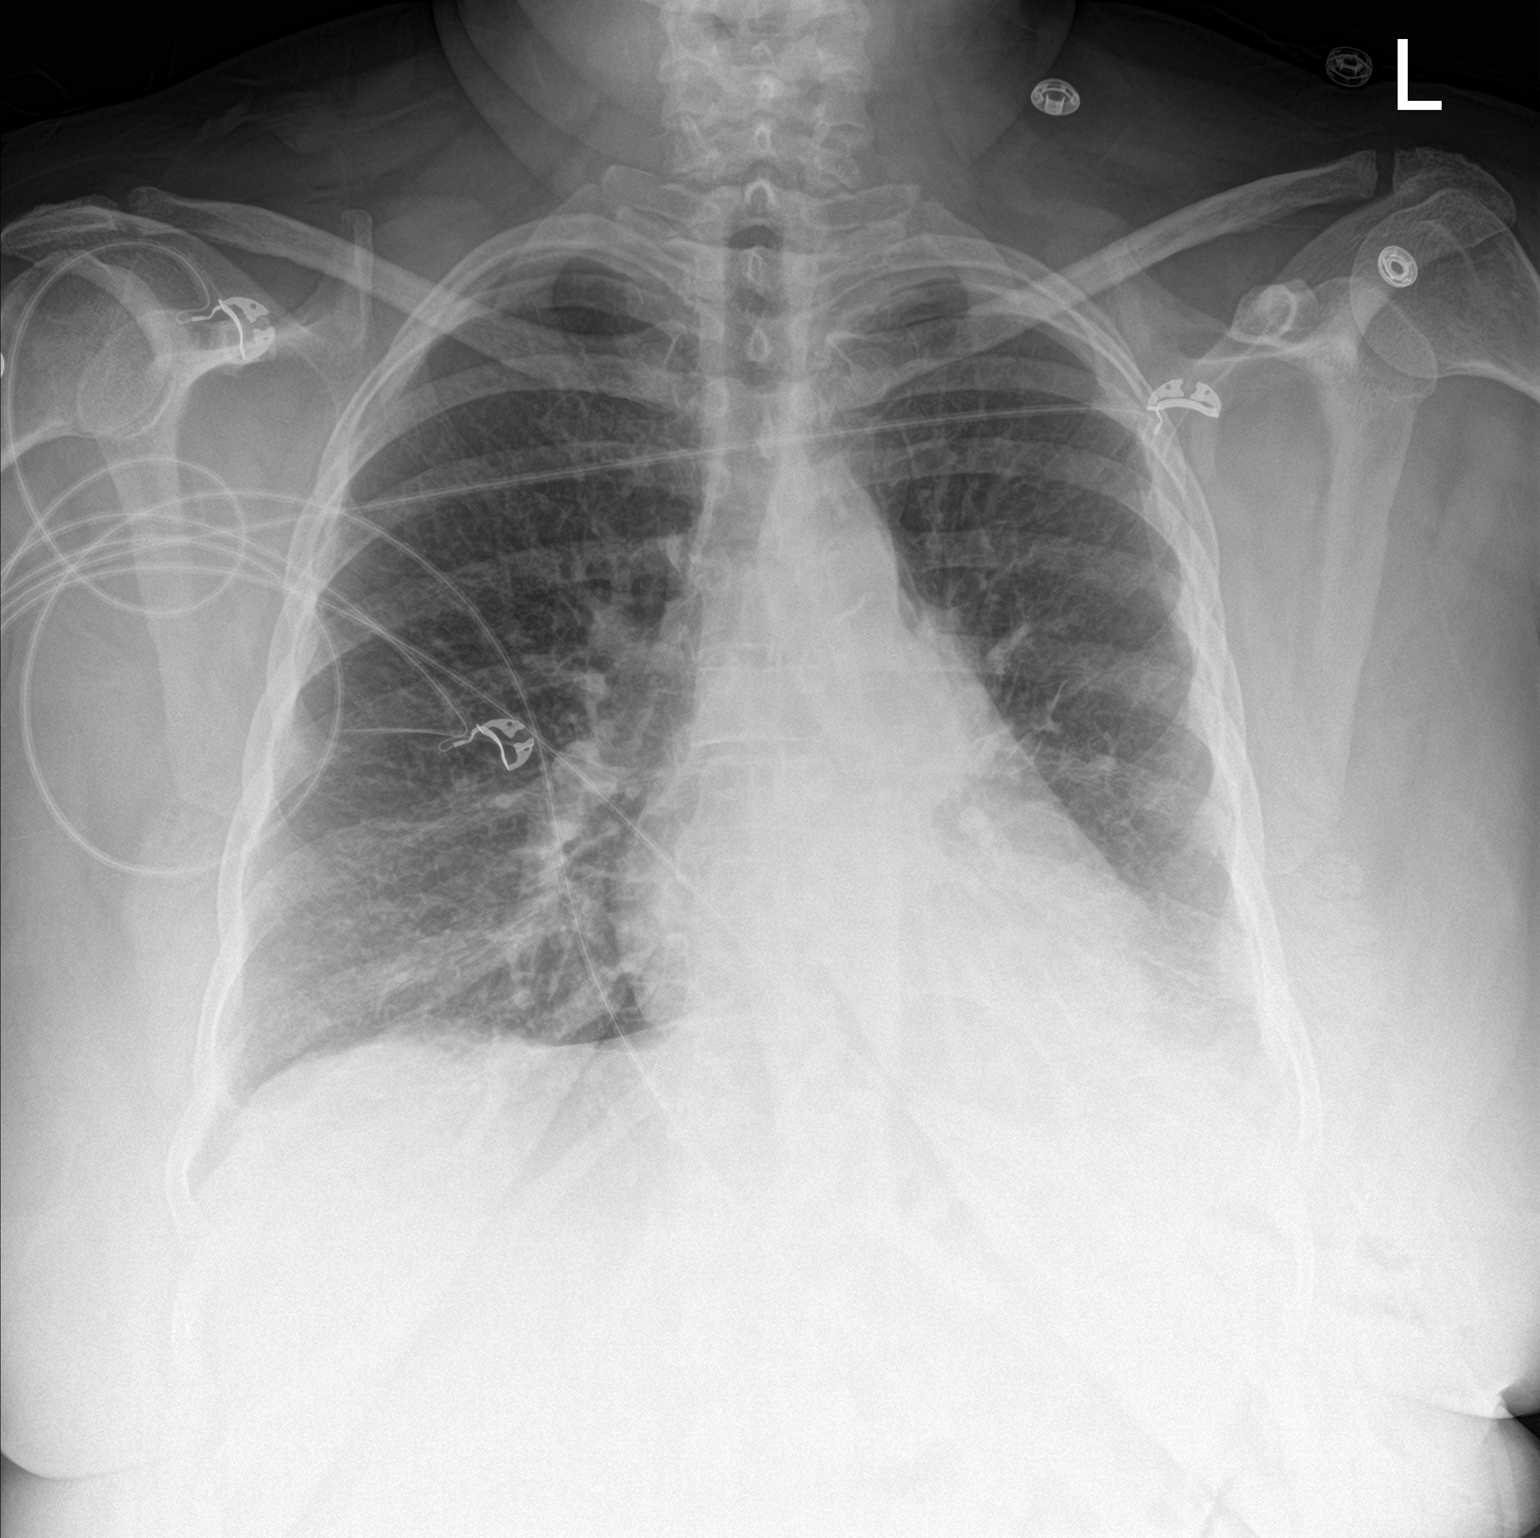

[chest lat]
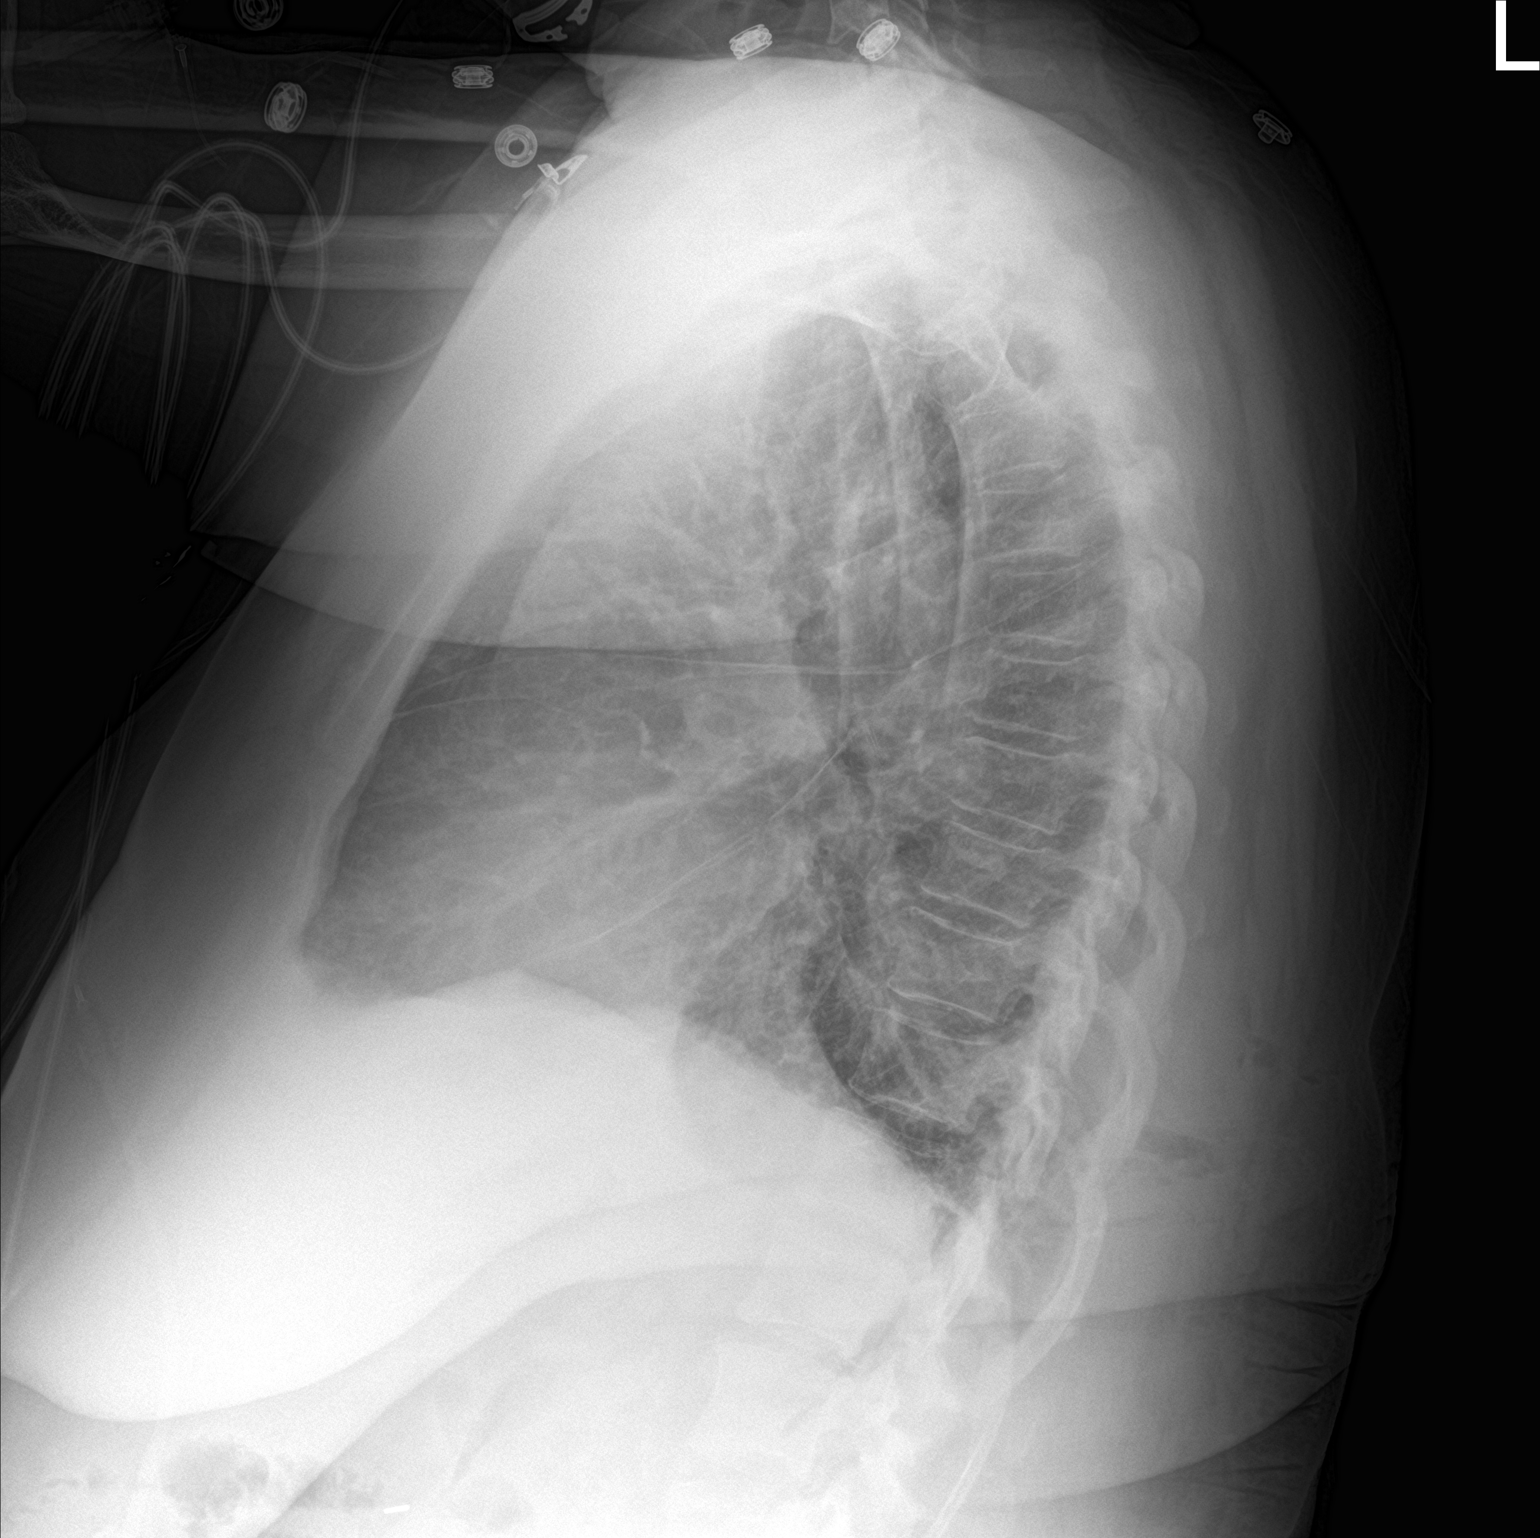

[2 of 2 positions shown; findings below may reference images not displayed]

FINDINGS: Subtle left-sided volume loss and left basilar atelectasis again
noted. No confluent pulmonary infiltrate. No pneumothorax or pleural
effusion. Cardiac size within normal limits. Pulmonary vascularity
is normal.
IMPRESSION: Left-sided volume loss.  No pneumothorax.

## 2020-07-15 MED ORDER — OXYCODONE HCL 5 MG PO TABS: 5.0000 mg | ORAL_TABLET | ORAL | 0 refills | Status: DC | PRN

## 2020-07-15 NOTE — Progress Notes (Addendum)
      [address]       [place] [address]             [phone]      3 Days Post-Op Procedure(s) (LRB): XI ROBOTIC ASSISTED THORASCOPY-LEFT LOWER LOBECTOMY (Left) LYMPH NODE DISSECTION (Left) INTERCOSTAL NERVE BLOCK (N/A)   Subjective:  No new complaints.  Patient states she feels fantastic.  No longer requiring oxygen  Objective: Vital signs in last 24 hours: Temp:  [97.7 F (36.5 C)-98.4 F (36.9 C)] 97.7 F (36.5 C) (01/27 0321) Pulse Rate:  [78-90] 84 (01/27 0709) Cardiac Rhythm: Normal sinus rhythm (01/27 0700) Resp:  [12-19] 18 (01/27 0709) BP: (103-142)/(57-71) 103/59 (01/27 0321) SpO2:  [91 %-100 %] 95 % (01/27 0709)  Intake/Output from previous day: 01/26 0701 - 01/27 0700 In: 600 [P.O.:600] Out: 473 [Urine:453; Chest Tube:20]  General appearance: alert, cooperative and no distress Heart: regular rate and rhythm Lungs: clear to auscultation bilaterally Abdomen: soft, non-tender; bowel sounds normal; no masses,  no organomegaly Extremities: extremities normal, atraumatic, no cyanosis or edema Wound: clean and dry  Lab Results: Recent Labs    07/13/20 0500 07/14/20 0130  WBC 9.0 6.7  HGB 9.0* 8.5*  HCT 28.3* 26.4*  PLT 128* 116*   BMET:  Recent Labs    07/13/20 0500 07/14/20 0130  NA 137 137  K 3.6 3.7  CL 99 102  CO2 25 23  GLUCOSE 183* 204*  BUN <5* 5*  CREATININE 0.83 0.98  CALCIUM 7.3* 6.9*    PT/INR: No results for input(s): LABPROT, INR in the last 72 hours. ABG    Component Value Date/Time   PHART 7.137 (LL) 07/12/2020 1530   HCO3 25.7 07/12/2020 1530   TCO2 31 07/12/2020 1353   ACIDBASEDEF 2.4 (H) 07/12/2020 1530   O2SAT 94.6 07/12/2020 1530   CBG (last 3)  Recent Labs    07/14/20 1615 07/14/20 2110 07/15/20 0558  GLUCAP 137* 221* 106*    Assessment/Plan: S/P Procedure(s) (LRB): XI ROBOTIC ASSISTED THORASCOPY-LEFT LOWER LOBECTOMY (Left) LYMPH NODE DISSECTION (Left) INTERCOSTAL NERVE BLOCK  (N/A)  1. CV- hemodynamically stable in NSR 2. Pulm- CT removed yesterday, f/u CXR w/o pneumothorax, continued atelectasis remains present continue IS 3. DM- sugars are elevated at times- will resume all home diabetic medications at discharge 4. Dispo- patient stable, off oxygen, no pneumothorax on CXR, will d/c home today   LOS: 3 days    [place] Handler, PA-C 07/15/2020 Patient seen and examined, agree with above CXR looks good. She is doing well Home today Will arrange Oncology eval as outpatient  Remo Lipps C. Roxan Hockey, MD [facility] [phone]

## 2020-07-15 NOTE — Progress Notes (Signed)
Discharge instructions given to patient along with follow up appointments and new medication. Discharge instructions also given to NP Deneise Lever at [facility]. Patient's son here to take patient home. 2 PIV's removed without complication, sites were clean dry and intact.

## 2020-07-15 NOTE — Discharge Instructions (Signed)
Pneumothorax A pneumothorax is commonly called a collapsed lung. It is a condition in which air leaks from a lung and builds up between the thin layer of tissue that covers the lungs (visceral pleura) and the interior wall of the chest cavity (parietal pleura). The air gets trapped outside the lung, between the lung and the chest wall (pleural space). The air takes up space and prevents the lung from fully expanding. This condition sometimes occurs suddenly with no apparent cause. The buildup of air may be small or large. A small pneumothorax may go away on its own. A large pneumothorax will require treatment and hospitalization. What are the causes? This condition may be caused by:  Trauma and injury to the chest wall.  Surgery and other medical procedures.  A complication of an underlying lung problem, especially chronic obstructive pulmonary disease (COPD) or emphysema. Sometimes the cause of this condition is not known. What increases the risk? You are more likely to develop this condition if:  You have an underlying lung problem.  You smoke.  You are 102-59 years old, female, tall, and underweight.  You have a personal or family history of pneumothorax.  You have an eating disorder (anorexia nervosa). This condition can also happen quickly, even in people with no history of lung problems. What are the signs or symptoms? Sometimes a pneumothorax will have no symptoms. When symptoms are present, they can include:  Chest pain.  Shortness of breath.  Increased rate of breathing.  Bluish color to your lips or skin (cyanosis). How is this diagnosed? This condition may be diagnosed by:  A medical history and physical exam.  A chest X-ray, chest CT scan, or ultrasound. How is this treated? Treatment depends on how severe your condition is. The goal of treatment is to remove the extra air and allow your lung to expand back to its normal size.  For a small pneumothorax: ? No  treatment may be needed. ? Extra oxygen is sometimes used to make it go away more quickly.  For a large pneumothorax or one that is causing symptoms, a procedure is done to release the air from around your lungs. To do this, a health care provider may use: ? A needle with a syringe. This is used to suck air from a pleural space where no additional leakage is taking place. ? A chest tube. This is used to suck air where there is ongoing leakage into the pleural space. The chest tube may need to remain in place for several days until the air leak has healed.  In more severe cases, surgery may be needed to repair the damage that is causing the leak.  If you have multiple pneumothorax episodes or have an air leak that will not heal, a procedure called a pleurodesis may be done. A medicine is placed in the pleural space to irritate the tissues around the lung so that the lung will stick to the chest wall, seal any leaks, and stop any buildup of air in that space. If you have an underlying lung problem, severe symptoms, or a large pneumothorax you will usually need to stay in the hospital.   Follow these instructions at home: Lifestyle  Do not use any products that contain nicotine or tobacco, such as cigarettes and e-cigarettes. These are major risk factors in pneumothorax. If you need help quitting, ask your health care provider.  Do not lift anything that is heavier than 10 lb (4.5 kg), or the limit that  your health care provider tells you, until he or she says that it is safe.  Avoid activities that take a lot of effort (strenuous) for as long as told by your health care provider.  Return to your normal activities as told by your health care provider. Ask your health care provider what activities are safe for you.  Do not fly in an airplane or scuba dive until your health care provider says it is okay. General instructions  Take over-the-counter and prescription medicines only as told by your  health care provider.  If a cough or pain makes it difficult for you to sleep at night, try sleeping in a semi-upright position in a recliner or by using 2 or 3 pillows.  If you had a chest tube and it was removed, ask your health care provider when you can remove the bandage (dressing). While the dressing is in place, do not allow it to get wet.  Keep all follow-up visits as told by your health care provider. This is important. Contact a health care provider if:  You cough up thick mucus (sputum) that is yellow or green in color.  You were treated with a chest tube, and you have redness, increasing pain, or discharge at the site where it was placed. Get help right away if:  You have increasing chest pain or shortness of breath.  You have a cough that will not go away.  You begin coughing up blood.  You have pain that is getting worse or is not controlled with medicines.  The site where your chest tube was located opens up.  You feel air coming out of the site where the chest tube was placed.  You have a fever or persistent symptoms for more than 2-3 days. These symptoms may represent a serious problem that is an emergency. Do not wait to see if the symptoms will go away. Get medical help right away. Call your local emergency services (911 in the [place] Do not drive yourself to the hospital. Summary  A pneumothorax, commonly called a collapsed lung, is a condition in which air leaks from a lung and gets trapped between the lung and the chest wall (pleural space).  The buildup of air may be small or large. A small pneumothorax may go away on its own. A large pneumothorax will require treatment and hospitalization.  Treatment for this condition depends on how severe the pneumothorax is. The goal of treatment is to remove the extra air and allow the lung to expand back to its normal size. This information is not intended to replace advice given to you by your health care provider.  Make sure you discuss any questions you have with your health care provider. Document Revised: 02/05/2020 Document Reviewed: 02/05/2020 Elsevier Patient Education  2021 [employer] Thoracic Surgery, Care After The following information offers guidance on how to care for yourself after your procedure. Your health care provider may also give you more specific instructions. If you have problems or questions, contact your health care provider. What can I expect after the procedure? After the procedure, it is common to have:  Some pain and aches in the area of your surgical incisions.  Pain when breathing in (inhaling) and coughing.  Tiredness (fatigue).  Trouble sleeping.  Constipation. Follow these instructions at home: Medicines  Take over-the-counter and prescription medicines only as told by your health care provider.  If you were prescribed an antibiotic medicine, take it as told by your health  care provider. Do not stop taking the antibiotic even if you start to feel better.  Talk with your health care provider about safe and effective ways to manage pain after your procedure. Pain management should fit your specific health needs.  Take pain medicine before pain becomes severe. Relieving and controlling your pain will make breathing easier for you.  Ask your health care provider if the medicine prescribed to you requires you to avoid driving or using machinery. Eating and drinking Follow instructions from your health care provider about eating or drinking restrictions. These will vary depending on what procedure you had. Your health care provider may recommend:  A liquid diet or soft diet for the first few days.  Meals that are smaller and more frequent.  A diet of fruits, vegetables, whole grains, and low-fat proteins.  Limiting foods that are high in fat and processed sugar, including fried or sweet foods. Incision care  Follow instructions from your  health care provider about how to take care of your incisions. Make sure you: ? Wash your hands with soap and water for at least 20 seconds before and after you change your bandage (dressing). If soap and water are not available, use hand sanitizer. ? Change your dressing as told by your health care provider. ? Leave stitches (sutures), skin glue, or adhesive strips in place. These skin closures may need to stay in place for 2 weeks or longer. If adhesive strip edges start to loosen and curl up, you may trim the loose edges. Do not remove adhesive strips completely unless your health care provider tells you to do that.  Check your incision area every day for signs of infection. Check for: ? Redness, swelling, or more pain. ? Fluid or blood. ? Warmth. ? Pus or a bad smell. Activity  Return to your normal activities as told by your health care provider. Ask your health care provider what activities are safe for you.  Ask your health care provider when it is safe for you to drive.  Do not lift anything that is heavier than 10 lb (4.5 kg), or the limit that you are told, until your health care provider says that it is safe.  Rest as told by your health care provider.  Avoid sitting for a long time without moving. Get up to take short walks every 1-2 hours. This is important to improve blood flow and breathing. Ask for help if you feel weak or unsteady.  Do exercises as told by your health care provider. Pneumonia prevention  Do deep breathing exercises and cough regularly as directed. This helps clear mucus and opens your lungs. Doing this helps prevent lung infection (pneumonia).  If you were given an incentive spirometer, use it as told. An incentive spirometer is a tool that measures how well you are filling your lungs with each breath.  Coughing may hurt less if you try to support your chest. This is called splinting. Try one of these when you cough: ? Hold a pillow against your  chest. ? Place the palms of both hands on top of your incision area.  Do not use any products that contain nicotine or tobacco. These products include cigarettes, chewing tobacco, and vaping devices, such as e-cigarettes. If you need help quitting, ask your health care provider.  Avoid secondhand smoke.   General instructions  If you have a drainage tube: ? Follow instructions from your health care provider about how to take care of it. ? Do not  travel by airplane after your tube is removed until your health care provider tells you it is safe.  You may need to take these actions to prevent or treat constipation: ? Drink enough fluid to keep your urine pale yellow. ? Take over-the-counter or prescription medicines. ? Eat foods that are high in fiber, such as beans, whole grains, and fresh fruits and vegetables. ? Limit foods that are high in fat and processed sugars, such as fried or sweet foods.  Keep all follow-up visits. This is important. Contact a health care provider if:  You have redness, swelling, or more pain around an incision.  You have fluid or blood coming from an incision.  An incision feels warm to the touch.  You have pus or a bad smell coming from an incision.  You have a fever.  You cannot eat or drink without vomiting.  Your pain medicine is not controlling your pain. Get help right away if:  You have chest pain.  Your heart is beating quickly.  You have trouble breathing.  You have trouble speaking.  You are confused.  You feel weak or dizzy, or you faint. These symptoms may represent a serious problem that is an emergency. Do not wait to see if the symptoms will go away. Get medical help right away. Call your local emergency services (911 in the [place] Do not drive yourself to the hospital. Summary  Talk with your health care provider about safe and effective ways to manage pain after your procedure. Pain management should fit your specific health  needs.  Return to your normal activities as told by your health care provider. Ask your health care provider what activities are safe for you.  Do deep breathing exercises and cough regularly as directed. This helps to clear mucus and prevent pneumonia. If it hurts to cough, ease pain by holding a pillow against your chest or by placing the palms of both hands over your incisions. This information is not intended to replace advice given to you by your health care provider. Make sure you discuss any questions you have with your health care provider. Document Revised: 02/27/2020 Document Reviewed: 02/27/2020 Elsevier Patient Education  2021 [employer].

## 2020-07-15 NOTE — Plan of Care (Signed)

## 2020-07-21 ENCOUNTER — Ambulatory Visit: Payer: Medicaid Other | Admitting: Primary Care

## 2020-07-22 ENCOUNTER — Other Ambulatory Visit: Payer: Self-pay | Admitting: *Deleted

## 2020-07-22 NOTE — Progress Notes (Signed)
The proposed treatment discussed in cancer conference is for discussion purpose only and is not a binding recommendation. The patient was not physically examined nor present for their treatment options. Therefore, final treatment plans cannot be decided.  ?

## 2020-07-26 ENCOUNTER — Other Ambulatory Visit: Payer: Self-pay | Admitting: Thoracic Surgery (Cardiothoracic Vascular Surgery)

## 2020-07-26 DIAGNOSIS — C3432 Malignant neoplasm of lower lobe, left bronchus or lung: Secondary | ICD-10-CM

## 2020-07-26 NOTE — Progress Notes (Signed)
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   INTERVAL HISTORY: Patient returns to clinic today approximately 2 weeks postoperatively to discuss her final pathology results and treatment planning. She continues to have significant pain from her surgery, but otherwise feels well.  She has no neurologic complaints.  She denies any recent fevers or illnesses.  She has a good appetite and denies weight loss.  She has no chest pain, shortness of breath, cough, or hemoptysis.  She denies any nausea, vomiting, constipation, or diarrhea.  She has no urinary complaints. Patient offers no further specific complaints today.  REVIEW OF SYSTEMS:   Review of Systems  Constitutional: Negative.  Negative for fever, malaise/fatigue and weight loss.  Respiratory: Negative.  Negative for cough, hemoptysis and shortness of breath.   Cardiovascular: Negative.  Negative for chest pain and leg swelling.  Gastrointestinal: Negative.  Negative for abdominal pain.  Genitourinary: Positive for flank pain. Negative for dysuria.  Musculoskeletal: Negative for back pain and joint pain.  Skin: Negative.  Negative for rash.  Neurological: Negative.  Negative for dizziness, focal weakness, weakness and headaches.  Psychiatric/Behavioral: Negative.  The patient is not nervous/anxious.     As per HPI. Otherwise, a complete review of systems is negative.  PAST MEDICAL HISTORY: Past Medical History:  Diagnosis Date  . Asthma   . COPD (chronic obstructive pulmonary disease) ([place])   . CVA (cerebral vascular accident) ([place]) 2014  . Depression   . Diabetes mellitus ([place])   . Diabetes mellitus without  complication ([place])   . Hepatitis C   . History of kidney stones   . Hyperlipidemia   . Iron deficiency anemia   . Obesity   . OSA on CPAP    Mild, noncompliant with CPAP said cpap machine was recalled, is waiting for a replacement   . Paranoia ([place])   . Schizoaffective disorder, bipolar type ([place])     PAST SURGICAL HISTORY: Past Surgical History:  Procedure Laterality Date  . BREAST CYST EXCISION Bilateral 2013   Benign  . CESAREAN SECTION    . CHOLECYSTECTOMY    . COLONOSCOPY    . ESOPHAGOGASTRODUODENOSCOPY  2020   done in [place]  . INGUINAL HERNIA REPAIR Left 2002  . INTERCOSTAL NERVE BLOCK N/A 07/12/2020   Procedure: INTERCOSTAL NERVE BLOCK;  Surgeon: Melrose Nakayama, MD;  Location: [place];  Service: Thoracic;  Laterality: N/A;  . LYMPH NODE DISSECTION Left 07/12/2020   Procedure: LYMPH NODE DISSECTION;  Surgeon: Melrose Nakayama, MD;  Location: [place];  Service: Thoracic;  Laterality: Left;  . UMBILICAL HERNIA REPAIR    . VIDEO BRONCHOSCOPY WITH ENDOBRONCHIAL NAVIGATION N/A 06/07/2020   Procedure: VIDEO BRONCHOSCOPY WITH ENDOBRONCHIAL NAVIGATION;  Surgeon: Tyler Pita, MD;  Location: [facility];  Service: Pulmonary;  Laterality: N/A;    FAMILY HISTORY: No family history on file.  ADVANCED DIRECTIVES (Y/N):  N  HEALTH MAINTENANCE: Social History   Tobacco Use  . Smoking status: Former Smoker    Packs/day: 2.00    Years: 38.00    Pack years: 76.00    Types: Cigarettes    Quit date: 2012    Years since quitting: 10.1  . Smokeless tobacco: Former Network engineer  .  Vaping Use: Never used  Substance Use Topics  . Alcohol use: Yes    Comment: occassionaly  . Drug use: Not Currently     Colonoscopy:  PAP:  Bone density:  Lipid panel:  No Known Allergies  Current Outpatient Medications  Medication Sig Dispense Refill  . acetaminophen (TYLENOL) 650 MG CR tablet Take 650 mg by mouth 2 (two) times daily.    Marland Kitchen albuterol (VENTOLIN HFA) 108 (90 Base)  MCG/ACT inhaler Inhale 2 puffs into the lungs every 6 (six) hours as needed for wheezing or shortness of breath.    Marland Kitchen atorvastatin (LIPITOR) 40 MG tablet Take 40 mg by mouth at bedtime.    . benztropine (COGENTIN) 1 MG tablet Take 1 mg by mouth at bedtime.    . citalopram (CELEXA) 20 MG tablet Take 20 mg by mouth every morning.    . Dulaglutide (TRULICITY) 1.5 OI/7.1IW SOPN Inject 1.5 mg into the skin every Friday.    . Fluticasone-Umeclidin-Vilant (TRELEGY ELLIPTA) 100-62.5-25 MCG/INH AEPB Inhale 1 puff into the lungs daily.    Marland Kitchen gabapentin (NEURONTIN) 300 MG capsule Take 300 mg by mouth 2 (two) times daily.    . insulin aspart (NOVOLOG) 100 UNIT/ML FlexPen Inject 9 Units into the skin 3 (three) times daily with meals. (Patient taking differently: Inject 4 Units into the skin 3 (three) times daily with meals.) 15 mL 11  . insulin detemir (LEVEMIR) 100 UNIT/ML injection Inject 15 Units into the skin 2 (two) times daily.    Marland Kitchen latanoprost (XALATAN) 0.005 % ophthalmic solution Place 1 drop into both eyes daily.    Marland Kitchen lidocaine (LIDODERM) 5 % Place 2 patches onto the skin See admin instructions. Apply 1 patch to each foot every 12 hours    . LORazepam (ATIVAN) 1 MG tablet Take 1 mg by mouth 2 (two) times daily.    Marland Kitchen LYRICA 75 MG capsule Take 1 capsule by mouth twice a day  for nerve pain    . ziprasidone (GEODON) 80 MG capsule Take 80 mg by mouth at bedtime.    Marland Kitchen oxyCODONE (OXY IR/ROXICODONE) 5 MG immediate release tablet Take 1 tablet (5 mg total) by mouth every 4 (four) hours as needed for moderate pain. 30 tablet 0   No current facility-administered medications for this visit.    OBJECTIVE: Vitals:   07/27/20 1113  BP: 131/73  Pulse: 87  Resp: 18  Temp: 99.4 F (37.4 C)  SpO2: 96%     Body mass index is 43.9 kg/m.    ECOG FS:1 - Symptomatic but completely ambulatory  General: Well-developed, well-nourished, no acute distress. Eyes: Pink conjunctiva, anicteric sclera. HEENT:  Normocephalic, moist mucous membranes. Lungs: No audible wheezing or coughing. Heart: Regular rate and rhythm. Abdomen: Soft, nontender, no obvious distention. Musculoskeletal: No edema, cyanosis, or clubbing. Neuro: Alert, answering all questions appropriately. Cranial nerves grossly intact. Skin: No rashes or petechiae noted. Psych: Normal affect.   LAB RESULTS:  Lab Results  Component Value Date   NA 137 07/14/2020   K 3.7 07/14/2020   CL 102 07/14/2020   CO2 23 07/14/2020   GLUCOSE 204 (H) 07/14/2020   BUN 5 (L) 07/14/2020   CREATININE 0.98 07/14/2020   CALCIUM 6.9 (L) 07/14/2020   PROT 4.9 (L) 07/14/2020   ALBUMIN 2.8 (L) 07/14/2020   AST 49 (H) 07/14/2020   ALT 34 07/14/2020   ALKPHOS 43 07/14/2020   BILITOT 0.7 07/14/2020   GFRNONAA >60 07/14/2020   GFRAA >60 01/30/2020  Lab Results  Component Value Date   WBC 6.7 07/14/2020   HGB 8.5 (L) 07/14/2020   HCT 26.4 (L) 07/14/2020   MCV 97.1 07/14/2020   PLT 116 (L) 07/14/2020     STUDIES: DG Chest 2 View  Result Date: 07/27/2020 CLINICAL DATA:  Lung cancer EXAM: CHEST - 2 VIEW COMPARISON:  07/15/2020 FINDINGS: Status post left lower lobectomy. Postsurgical changes at the left lung base. No other focal parenchymal opacity. No pleural effusion or pneumothorax. Stable cardiomediastinal silhouette. No acute osseous abnormality. IMPRESSION: No active cardiopulmonary disease. Postsurgical changes at the left lung base. Electronically Signed   By: Kathreen Devoid   On: 07/27/2020 14:32   DG Chest 2 View  Result Date: 07/15/2020 CLINICAL DATA:  Status post left lower lobectomy. EXAM: CHEST - 2 VIEW COMPARISON:  07/14/2020 FINDINGS: Subtle left-sided volume loss and left basilar atelectasis again noted. No confluent pulmonary infiltrate. No pneumothorax or pleural effusion. Cardiac size within normal limits. Pulmonary vascularity is normal. IMPRESSION: Left-sided volume loss.  No pneumothorax. Electronically Signed   By:  Fidela Salisbury MD   On: 07/15/2020 06:24   DG Chest 2 View  Result Date: 07/08/2020 CLINICAL DATA:  Preoperative EXAM: CHEST - 2 VIEW COMPARISON:  None. FINDINGS: The heart size and mediastinal contours are within normal limits. There is a mass of the superior segment left lower lobe measuring 4.1 cm in projection. The visualized skeletal structures are unremarkable. IMPRESSION: 1.  No acute abnormality of the lungs. 2. There is a mass of the superior segment left lower lobe measuring 4.1 cm in projection. Electronically Signed   By: Eddie Candle M.D.   On: 07/08/2020 12:42   DG Chest Port 1 View  Result Date: 07/14/2020 CLINICAL DATA:  Chest tube removal. EXAM: PORTABLE CHEST 1 VIEW COMPARISON:  07/14/2020. FINDINGS: Interim removal of left chest tube. No pneumothorax. Mediastinum and hilar structures normal. Stable borderline cardiomegaly. Mild left base atelectasis. Mild right lung interstitial prominence again noted. IMPRESSION: 1. Interim removal of left chest tube. No pneumothorax. Mild left base atelectasis. 2.  Mild right lung interstitial prominence again noted. 3. Stable borderline cardiomegaly.  No pulmonary venous congestion. Electronically Signed   By: Marcello Moores  Register   On: 07/14/2020 08:50   DG Chest Port 1 View  Result Date: 07/14/2020 CLINICAL DATA:  Chest tube.  History of lobectomy. EXAM: PORTABLE CHEST 1 VIEW COMPARISON:  07/13/2020. FINDINGS: Left chest tube in stable position. No pneumothorax. Low lung volumes with mild bibasilar atelectasis. Stable mild interstitial prominence right lung. No pleural effusion. Right costophrenic angle incompletely imaged. Stable borderline cardiomegaly. IMPRESSION: 1. Left chest tube in stable position. No pneumothorax. 2. Low lung volumes with mild bibasilar atelectasis. Stable mild interstitial prominence right lung. 3. Stable borderline cardiomegaly. Electronically Signed   By: Marcello Moores  Register   On: 07/14/2020 06:39   DG CHEST PORT 1  VIEW  Result Date: 07/13/2020 CLINICAL DATA:  Chest tube. EXAM: PORTABLE CHEST 1 VIEW COMPARISON:  07/12/2020. FINDINGS: Interim extubation. Left chest tube in stable position. No definite pneumothorax noted on today's exam. Interim resolution of right upper lung atelectasis. Low lung volumes. Mild right lung interstitial prominence. No prominent pleural effusion. Heart size stable. IMPRESSION: 1. Interim extubation. Left chest tube in stable position. No definite pneumothorax noted on today's exam. 2. Interim resolution of right upper lung atelectasis. Low lung volumes. Mild right lung interstitial prominence. Electronically Signed   By: Marcello Moores  Register   On: 07/13/2020 06:23   DG Chest  Port 1 View  Result Date: 07/12/2020 CLINICAL DATA:  Postoperative for lobectomy EXAM: PORTABLE CHEST 1 VIEW COMPARISON:  07/08/2020 chest radiograph. FINDINGS: Left apical chest tube in place. Endotracheal tube tip is 2.8 cm above the carina. Stable cardiomediastinal silhouette with normal heart size. Tiny left apical pneumothorax, less than 5%. No right pneumothorax. No pleural effusion. Mild bibasilar atelectasis. Complete right upper lobe atelectasis. IMPRESSION: 1. Tiny left apical pneumothorax, less than 5%. Left apical chest tube in place. 2. Well-positioned endotracheal tube. 3. Complete right upper lobe atelectasis. Follow-up chest radiographs suggested. 4. Mild bibasilar atelectasis. Electronically Signed   By: Ilona Sorrel M.D.   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   PLAN:    1. 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  Patient also noted to have a right iliac bony hypermetabolism as well as a soft tissue mass adjacent suspicious for isolated metastasis.  Soft  tissue mass was biopsied on May 19, 2020 which was negative for malignancy.  This lesion is still suspicious, so therefore we will continue to monitor closely. Patient underwent resection on July 12, 2020 which reviewed for positive lymph nodes increasing her stage IIb. Plan to give adjuvant chemotherapy using cisplatin and pemetrexed every 3 weeks for 4 cycles. Return to clinic in 2 weeks for further evaluation and additional treatment planning.  I spent a total of 30 minutes reviewing chart data, face-to-face evaluation with the patient, counseling and coordination of care as detailed above.   Patient expressed understanding and was in agreement with this plan. She also understands that She can call clinic at any time with any questions, concerns, or complaints.   Cancer Staging Primary adenocarcinoma of lower lobe of left lung [facility]) Staging form: Lung, AJCC 8th Edition - Pathologic stage from 07/27/2020: Stage IIB (pT2b, pN1, cM0) - Signed by Lloyd Huger, MD on 07/27/2020   Lloyd Huger, MD   07/28/2020 6:28 AM

## 2020-07-27 ENCOUNTER — Encounter: Payer: Self-pay | Admitting: *Deleted

## 2020-07-27 ENCOUNTER — Ambulatory Visit (INDEPENDENT_AMBULATORY_CARE_PROVIDER_SITE_OTHER): Payer: Self-pay | Admitting: Thoracic Surgery (Cardiothoracic Vascular Surgery)

## 2020-07-27 ENCOUNTER — Ambulatory Visit: Payer: Medicaid Other | Admitting: Thoracic Surgery (Cardiothoracic Vascular Surgery)

## 2020-07-27 ENCOUNTER — Ambulatory Visit
Admission: RE | Admit: 2020-07-27 | Discharge: 2020-07-27 | Disposition: A | Discharge status: Home / Self Care | Payer: Medicaid Other | Source: Ambulatory Visit | Attending: Thoracic Surgery (Cardiothoracic Vascular Surgery) | Admitting: Thoracic Surgery (Cardiothoracic Vascular Surgery)

## 2020-07-27 ENCOUNTER — Other Ambulatory Visit: Payer: Self-pay

## 2020-07-27 ENCOUNTER — Inpatient Hospital Stay: Payer: Medicaid Other | Attending: Oncology | Admitting: Oncology

## 2020-07-27 VITALS — BP 144/80 | HR 100 | Resp 20 | Ht 62.0 in | Wt 240.0 lb

## 2020-07-27 VITALS — BP 131/73 | HR 87 | Temp 99.4°F | Resp 18 | Wt 240.0 lb

## 2020-07-27 DIAGNOSIS — C771 Secondary and unspecified malignant neoplasm of intrathoracic lymph nodes: Secondary | ICD-10-CM | POA: Insufficient documentation

## 2020-07-27 DIAGNOSIS — R918 Other nonspecific abnormal finding of lung field: Secondary | ICD-10-CM

## 2020-07-27 DIAGNOSIS — C3432 Malignant neoplasm of lower lobe, left bronchus or lung: Secondary | ICD-10-CM | POA: Insufficient documentation

## 2020-07-27 DIAGNOSIS — Z9889 Other specified postprocedural states: Secondary | ICD-10-CM

## 2020-07-27 DIAGNOSIS — Z902 Acquired absence of lung [part of]: Secondary | ICD-10-CM | POA: Insufficient documentation

## 2020-07-27 DIAGNOSIS — M799 Soft tissue disorder, unspecified: Secondary | ICD-10-CM | POA: Diagnosis not present

## 2020-07-27 DIAGNOSIS — Z87891 Personal history of nicotine dependence: Secondary | ICD-10-CM | POA: Insufficient documentation

## 2020-07-27 IMAGING — CR DG CHEST 2V
2 series · 2 of 2 positions shown · non-contrast
Comparison: [DATE]

CLINICAL DATA: Lung cancer

EXAM:
CHEST - 2 VIEW

[w chest pa]
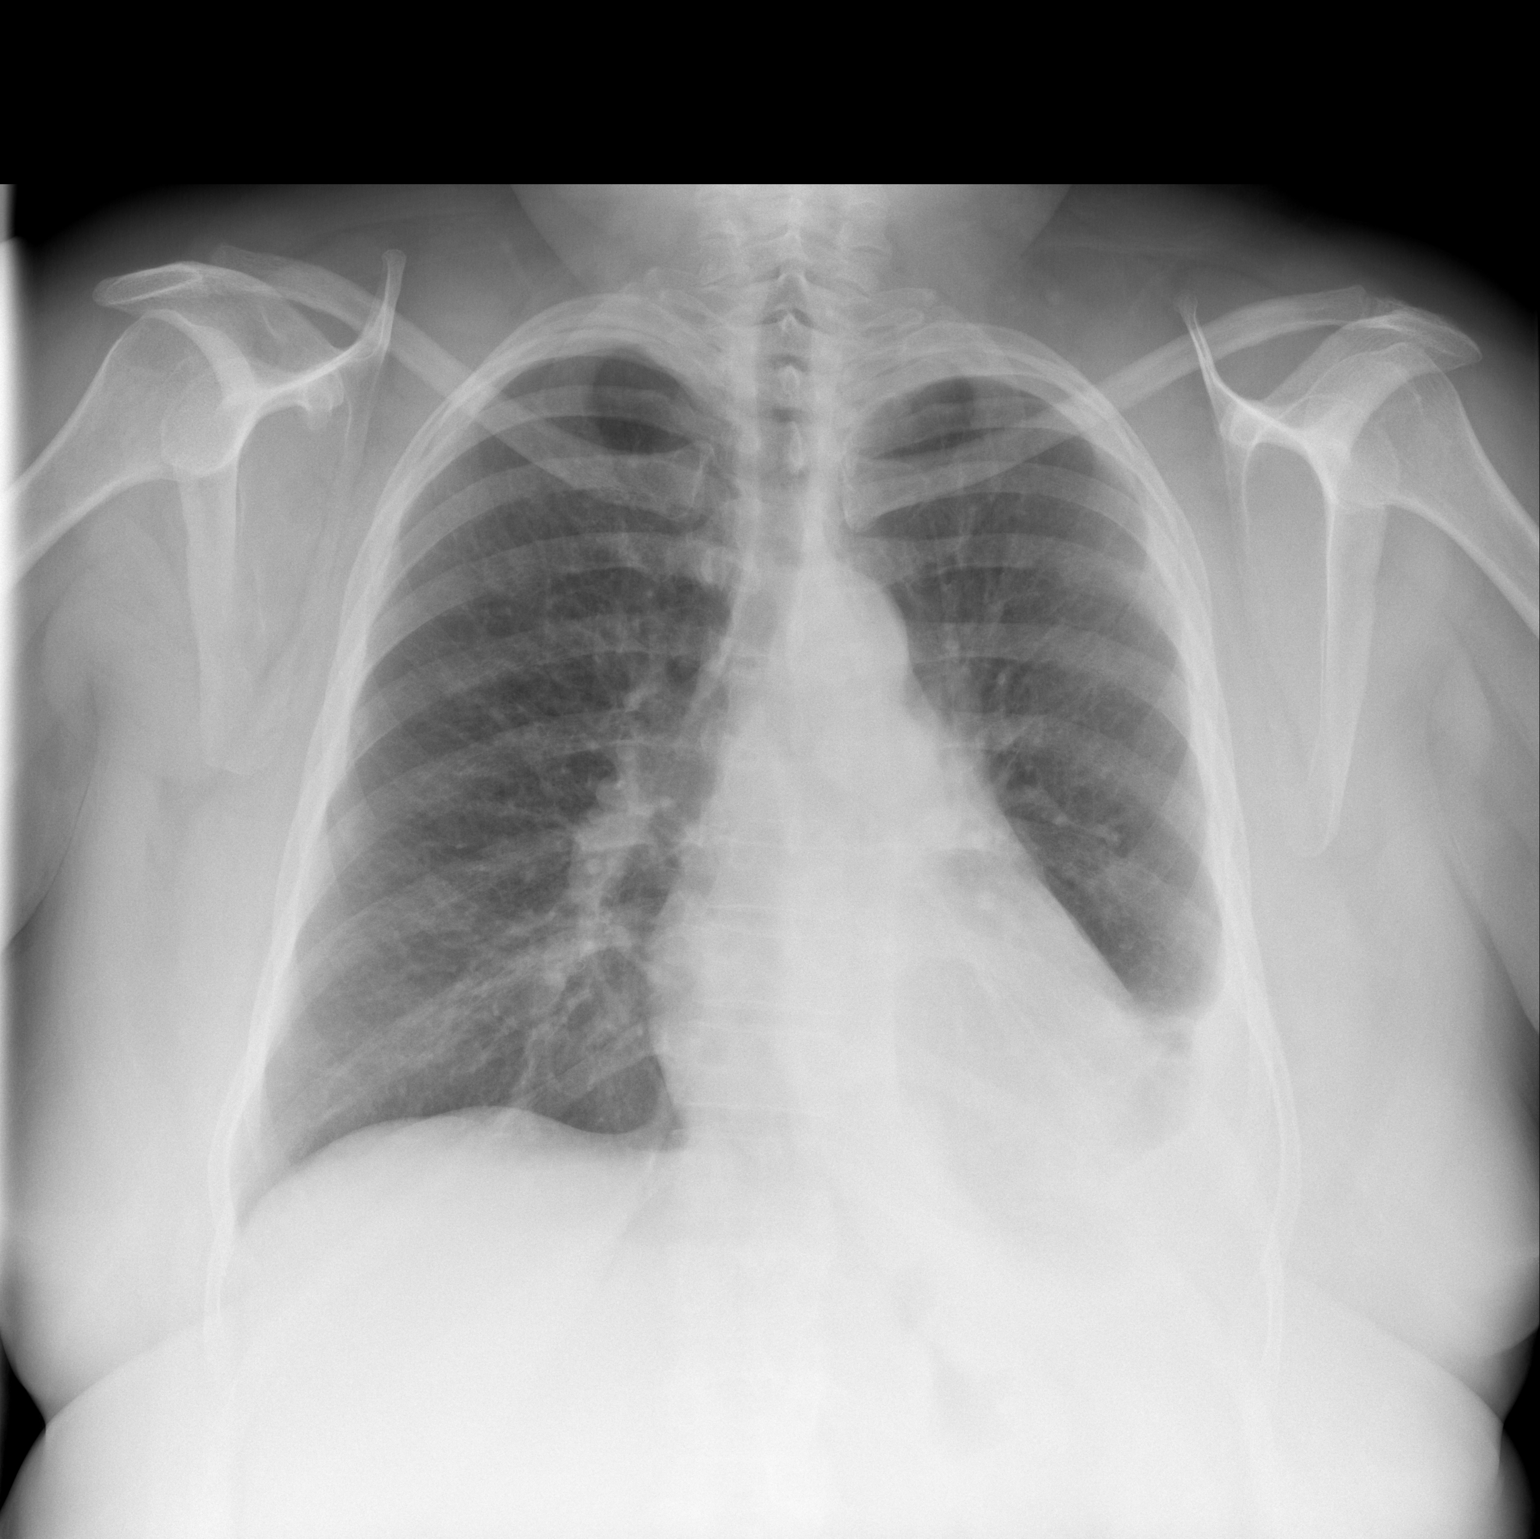

[w chest lat]
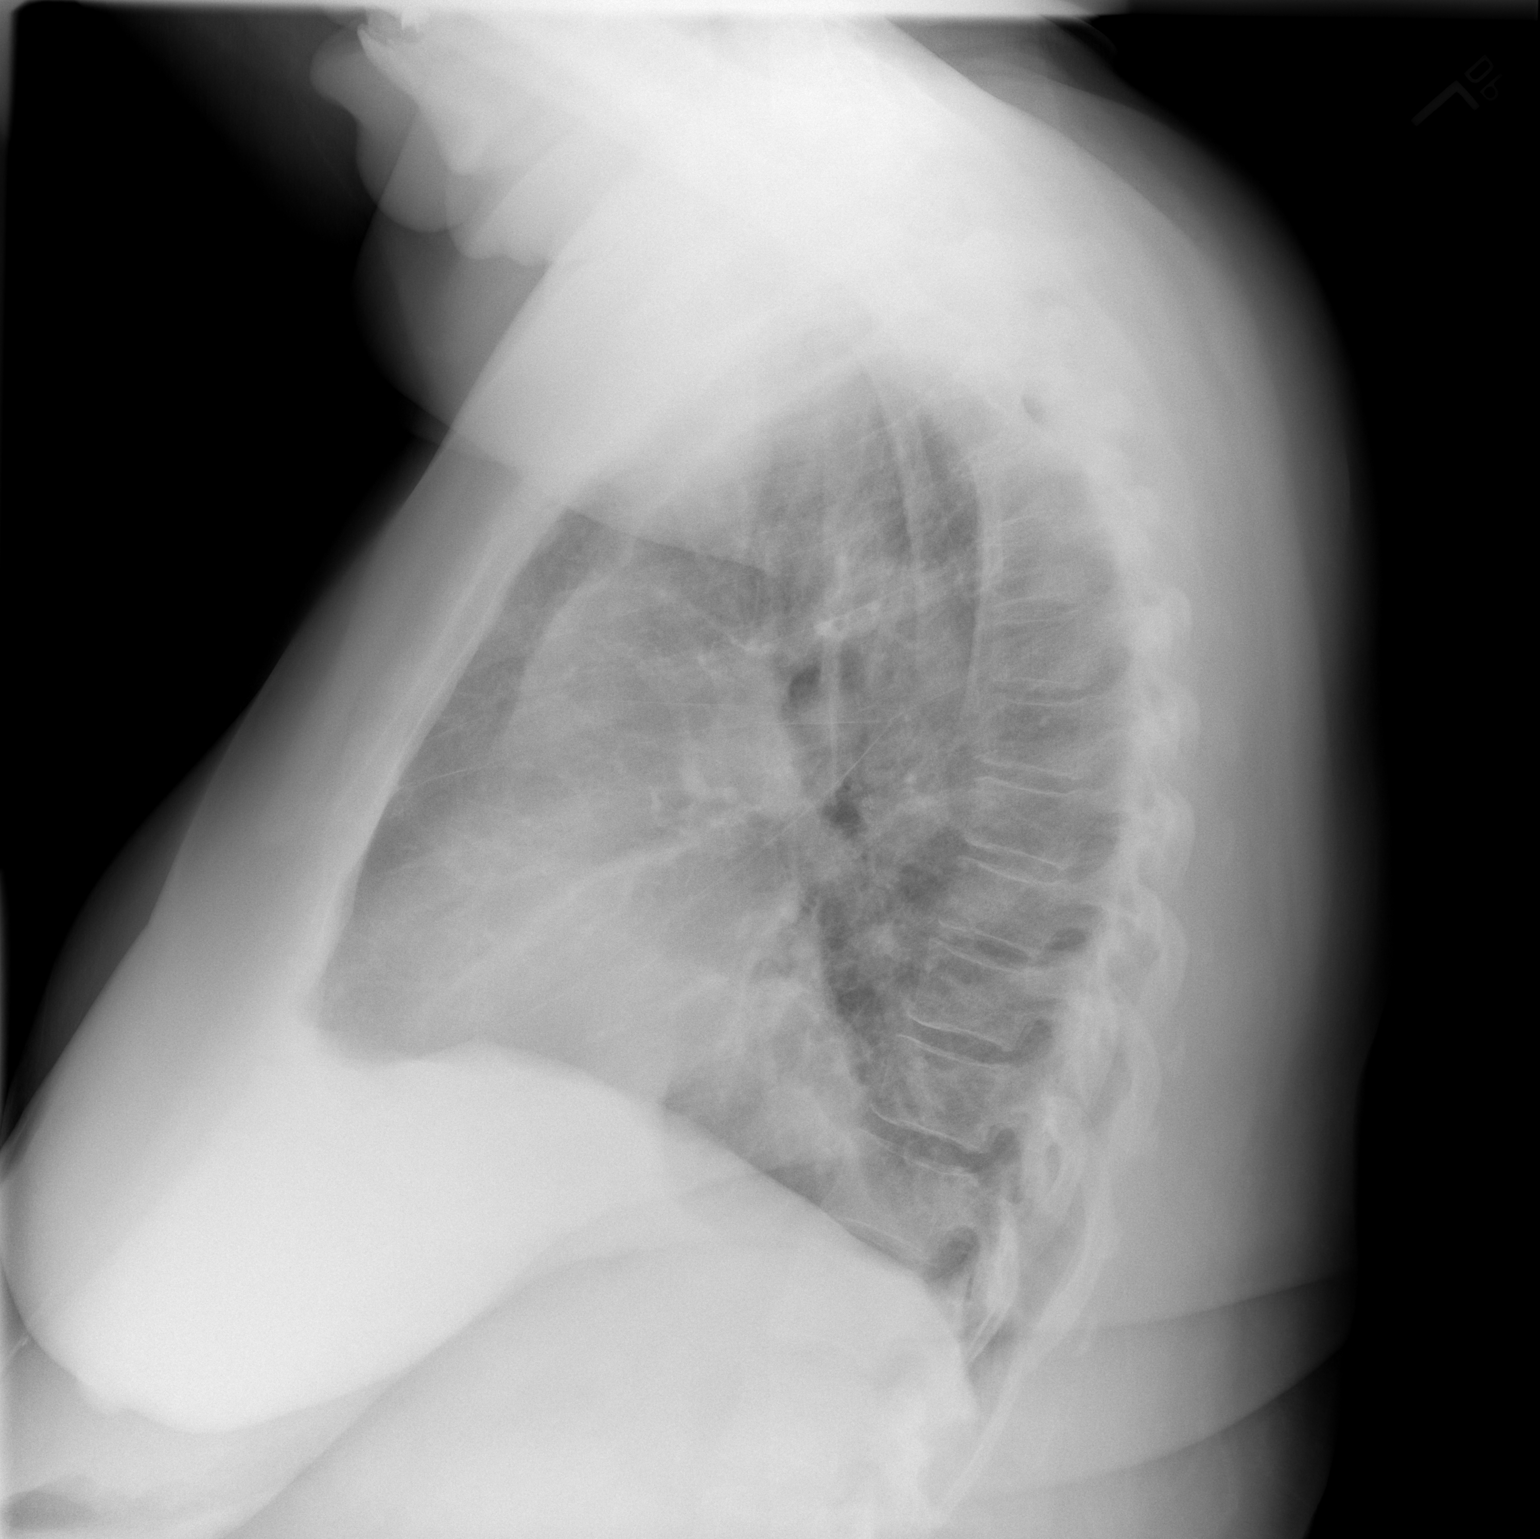

[2 of 2 positions shown; findings below may reference images not displayed]

FINDINGS: Status post left lower lobectomy. Postsurgical changes at the left
lung base. No other focal parenchymal opacity. No pleural effusion
or pneumothorax. Stable cardiomediastinal silhouette.

No acute osseous abnormality.
IMPRESSION: No active cardiopulmonary disease.

Postsurgical changes at the left lung base.

## 2020-07-27 MED ORDER — OXYCODONE HCL 5 MG PO TABS: 5.0000 mg | ORAL_TABLET | ORAL | 0 refills | Status: DC | PRN

## 2020-07-27 NOTE — Progress Notes (Signed)
[address]       [place] [address]             [phone]     HPI: Natalie [name] returns for scheduled follow-up visit  Natalie [name] is a 60 year old woman with a history of tobacco abuse, COPD, asthma, CVA, insulin-dependent diabetes, diabetic neuropathy, hepatitis C, hyperlipidemia, obesity, sleep apnea, and schizoaffective disorder.  She smoked about 2 packs of cigarettes daily before quitting in 2012.  In the fall of 2020 she was admitted to the hospital with diabetic ketoacidosis.  While there she was found to have a 3.1 x 2.1 cm left lower lobe lung mass.  On PET CT the mass was hypermetabolic.  There was some activity in her right iliac crest.  Biopsies of that were negative.  Dr. Patsey Berthold performed a navigational bronchoscopy and biopsy showed non-small cell carcinoma.  I did a robotic left lower lobectomy and node dissection on 07/12/2020.  Final pathology was T2, N1, stage IIb.  Postoperatively she did well and went home on day 3.  She says that she was taking oxycodone 1 tablet every 4-6 hours.  She ran out of those yesterday and has not had one in over 24 hours.  She describes her pain as being a 10 out of 10 currently.  She says that she is now taking Lyrica instead of Neurontin.  From a respiratory standpoint she is not having any issues.  Current Outpatient Medications  Medication Sig Dispense Refill  . acetaminophen (TYLENOL) 650 MG CR tablet Take 650 mg by mouth 2 (two) times daily.    Marland Kitchen albuterol (VENTOLIN HFA) 108 (90 Base) MCG/ACT inhaler Inhale 2 puffs into the lungs every 6 (six) hours as needed for wheezing or shortness of breath.    Marland Kitchen atorvastatin (LIPITOR) 40 MG tablet Take 40 mg by mouth at bedtime.    . benztropine (COGENTIN) 1 MG tablet Take 1 mg by mouth at bedtime.    . citalopram (CELEXA) 20 MG tablet Take 20 mg by mouth every morning.    . Dulaglutide (TRULICITY) 1.5 TK/1.6WF SOPN Inject 1.5 mg into the skin every Friday.    .  Fluticasone-Umeclidin-Vilant (TRELEGY ELLIPTA) 100-62.5-25 MCG/INH AEPB Inhale 1 puff into the lungs daily.    Marland Kitchen gabapentin (NEURONTIN) 300 MG capsule Take 300 mg by mouth 2 (two) times daily.    . insulin aspart (NOVOLOG) 100 UNIT/ML FlexPen Inject 9 Units into the skin 3 (three) times daily with meals. (Patient taking differently: Inject 4 Units into the skin 3 (three) times daily with meals.) 15 mL 11  . insulin detemir (LEVEMIR) 100 UNIT/ML injection Inject 15 Units into the skin 2 (two) times daily.    Marland Kitchen latanoprost (XALATAN) 0.005 % ophthalmic solution Place 1 drop into both eyes daily.    Marland Kitchen lidocaine (LIDODERM) 5 % Place 2 patches onto the skin See admin instructions. Apply 1 patch to each foot every 12 hours    . LORazepam (ATIVAN) 1 MG tablet Take 1 mg by mouth 2 (two) times daily.    Marland Kitchen LYRICA 75 MG capsule Take 1 capsule by mouth twice a day  for nerve pain    . ziprasidone (GEODON) 80 MG capsule Take 80 mg by mouth at bedtime.    Marland Kitchen oxyCODONE (OXY IR/ROXICODONE) 5 MG immediate release tablet Take 1 tablet (5 mg total) by mouth every 4 (four) hours as needed for moderate pain. 30 tablet 0   No current facility-administered  medications for this visit.    Physical Exam BP (!) 144/80 (BP Location: Left Arm, Patient Position: Sitting)   Pulse 100   Resp 20   Ht 5\' 2"  (1.575 m)   Wt 240 lb (108.9 kg)   SpO2 94% Comment: RA with mask on  BMI 43.90 kg/m  Obese 60 year old woman in no acute distress Alert and oriented x3 Lungs diminished at left base otherwise clear Incisions clean dry and intact Cardiac regular rate and rhythm  Diagnostic Tests: CHEST - 2 VIEW  COMPARISON:  07/15/2020  FINDINGS: Status post left lower lobectomy. Postsurgical changes at the left lung base. No other focal parenchymal opacity. No pleural effusion or pneumothorax. Stable cardiomediastinal silhouette.  No acute osseous abnormality.  IMPRESSION: No active cardiopulmonary  disease.  Postsurgical changes at the left lung base.   Electronically Signed   By: Kathreen Devoid   On: 07/27/2020 14:32 I personally reviewed the chest x-ray images and concur with the findings noted above  Impression: Natalie [name] is a 60 year old woman with a a history of tobacco abuse, COPD, asthma, CVA, insulin-dependent diabetes, diabetic neuropathy, hepatitis C, hyperlipidemia, obesity, sleep apnea, and schizoaffective disorder.  She had a robotic left lower lobectomy for stage IIb (T2, N1) adenocarcinoma on 07/12/2020.  Her postoperative course was uncomplicated and she went home on day 3.  She is doing well from a respiratory standpoint.  She is having significant incisional pain.  She has been taking 1 oxycodone 5 mg tablet for 5 times a day.  Her prescription ran out yesterday.  I sent a new prescription to [facility] in [place] at her request.  I recommended that she walk as much as possible.  She should limit any heavy physical exertion until her pain improves.  She is already seen Dr. Grayland Ormond.  He is planning adjuvant chemotherapy.  Plan: Prescription sent for oxycodone 5 mg p.o. every 4 hours as needed, 30 tablets, no refills Follow-up with Dr. Grayland Ormond as scheduled Return in 6 weeks with PA lateral chest x-ray Call if any issues that need to be addressed sooner  Melrose Nakayama, MD [facility] [phone]

## 2020-07-27 NOTE — Progress Notes (Signed)
START ON PATHWAY REGIMEN - Non-Small Cell Lung     A cycle is every 21 days:     Pemetrexed      Cisplatin   **Always confirm dose/schedule in your pharmacy ordering system**  Patient Characteristics: Postoperative without Neoadjuvant Therapy (Pathologic Staging), Stage IIB, Adjuvant Chemotherapy, Nonsquamous Cell Therapeutic Status: Postoperative without Neoadjuvant Therapy (Pathologic Staging) AJCC T Category: pT2b AJCC N Category: pN1 AJCC M Category: cM0 AJCC 8 Stage Grouping: IIB Histology: Nonsquamous Cell Intent of Therapy: Curative Intent, Discussed with Patient

## 2020-07-27 NOTE — Progress Notes (Signed)
  Oncology Nurse Navigator Documentation  Navigator Location: [facility] Onc (07/27/20 1200)   )Navigator Encounter Type: Follow-up Appt (07/27/20 1200)     Confirmed Diagnosis Date: 06/09/20 (07/27/20 1200)             Treatment Initiated Date: 07/12/20 (07/27/20 1200) Patient Visit Type: MedOnc (07/27/20 1200) Treatment Phase: Pre-Tx/Tx Discussion (07/27/20 1200) Barriers/Navigation Needs: Education;Coordination of Care (07/27/20 1200) Education: Understanding Cancer/ Treatment Options (07/27/20 1200) Interventions: Coordination of Care;Education (07/27/20 1200)   Coordination of Care: Appts (07/27/20 1200) Education Method: Written (07/27/20 1200)         met with patient after follow up visit with Dr. Grayland Ormond. Pt given resources regarding diagnosis and supportive services available. All questions answered during visit. Nothing further needed at this time. Pt instructed to call with any further questions or needs. Pt verbalized understanding.       Time Spent with Patient: 30 (07/27/20 1200)

## 2020-07-27 NOTE — Progress Notes (Signed)
Pt in for follow up, states having pain in left lateral back lung area where surgery was 2 weeks ago.

## 2020-07-27 NOTE — Research (Signed)
TrialMalen Gauze NSCLC [id number]  [id number] Alchemist:   Patient Natalie [name] was identified by clinical research nurse as a potential candidate for the above listed study.  This Clinical Research Nurse and Mauricio Po, [place] met with Anthony Roland, [id number], on 07/27/20 in a manner and location that ensures patient privacy to discuss participation in the above listed research study.  Patient is Unaccompanied.  A copy of the informed consent document with embedded HIPAA language was provided to the patient.  Patient reads, speaks, and understands [place].   Patient was provided with the business card of this Nurse and Mauricio Po, research specialist,  encouraged to contact the research team with any questions. The patient was informed that her participation in strictly voluntary, she does not have to participate and if she decides to withdraw from a study she can do that at any time. The patient voiced understanding of all discussion items. Approximately 20 minutes were spent with the patient reviewing the informed consent documents.  Patient was provided the option of taking informed consent documents home to review and was encouraged to review at their convenience with their support network, including other care providers. Patient took the consent documents home to review. Research nurse will follow up with the patient via telephone in approximately one week for her interest in participation in either study.   Jeral Fruit, RN 07/27/20 11:51 AM

## 2020-08-04 ENCOUNTER — Telehealth: Payer: Self-pay | Admitting: Pharmacy Technician

## 2020-08-05 ENCOUNTER — Other Ambulatory Visit: Payer: Self-pay

## 2020-08-05 ENCOUNTER — Ambulatory Visit (INDEPENDENT_AMBULATORY_CARE_PROVIDER_SITE_OTHER): Payer: Medicaid Other | Admitting: Pulmonary Disease

## 2020-08-05 ENCOUNTER — Encounter: Payer: Self-pay | Admitting: Pulmonary Disease

## 2020-08-05 VITALS — BP 134/78 | HR 62 | Temp 98.2°F | Ht 62.0 in | Wt 237.8 lb

## 2020-08-05 DIAGNOSIS — G4733 Obstructive sleep apnea (adult) (pediatric): Secondary | ICD-10-CM

## 2020-08-05 DIAGNOSIS — Z9989 Dependence on other enabling machines and devices: Secondary | ICD-10-CM

## 2020-08-05 DIAGNOSIS — J449 Chronic obstructive pulmonary disease, unspecified: Secondary | ICD-10-CM

## 2020-08-05 DIAGNOSIS — C3432 Malignant neoplasm of lower lobe, left bronchus or lung: Secondary | ICD-10-CM | POA: Diagnosis not present

## 2020-08-05 MED ORDER — TRELEGY ELLIPTA 100-62.5-25 MCG/INH IN AEPB: 1.0000 | INHALATION_SPRAY | Freq: Every day | RESPIRATORY_TRACT | 0 refills | Status: AC

## 2020-08-05 NOTE — Assessment & Plan Note (Signed)
Plan: Continue follow-up with oncology as well as cardiothoracic surgery

## 2020-08-05 NOTE — Assessment & Plan Note (Signed)
Plan: Continue Trelegy Ellipta Work on increasing overall physical activity

## 2020-08-05 NOTE — Assessment & Plan Note (Signed)
Known sleep apnea Current CPAP is being replaced due to [facility] recall Patient is awaiting CPAP replacement  Plan: Continue to clinically monitor

## 2020-08-05 NOTE — Progress Notes (Signed)
@Patient  ID: Natalie [name], female    DOB: June 17, 1961, 60 y.o.   MRN: [id number]  Chief Complaint  Patient presents with  . Follow-up    Sob with exertion.     Referring provider: [facility] [employer]  HPI:  60 year old female former smoker [facility] for COPD and adenocarcinoma  PMH: History of DKA, type 2 diabetes Smoker/ Smoking History: Former smoker.  Quit 2012.  76-pack-year smoking history Maintenance: Trelegy Ellipta Pt of: Dr. Patsey Berthold  08/05/2020  - Visit   60 year old female former smoker [facility] for COPD.  Established with Dr. Patsey Berthold.  Since last being seen patient is established with cardiothoracic surgery.  Last seen in February/2022 the assessment and plan as listed below:  Impression: Nashira [name] is a 60 year old woman with a a history of tobacco abuse, COPD, asthma, CVA, insulin-dependent diabetes, diabetic neuropathy, hepatitis C, hyperlipidemia, obesity, sleep apnea, and schizoaffective disorder.  She had a robotic left lower lobectomy for stage IIb (T2, N1) adenocarcinoma on 07/12/2020.  Her postoperative course was uncomplicated and she went home on day 3.  She is doing well from a respiratory standpoint.  She is having significant incisional pain.  She has been taking 1 oxycodone 5 mg tablet for 5 times a day.  Her prescription ran out yesterday.  I sent a new prescription to [facility] in [place] at her request.  I recommended that she walk as much as possible.  She should limit any heavy physical exertion until her pain improves.  She is already seen Dr. Grayland Ormond.  He is planning adjuvant chemotherapy.  Plan: Prescription sent for oxycodone 5 mg p.o. every 4 hours as needed, 30 tablets, no refills Follow-up with Dr. Grayland Ormond as scheduled Return in 6 weeks with PA lateral chest x-ray Call if any issues that need to be addressed sooner  Patient is also established with oncology Dr. Grayland Ormond.  Last seen on 07/27/2020  see assessment and plan listed below:  PLAN:    1. 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  Patient also noted to have a right iliac bony hypermetabolism as well as a soft tissue mass adjacent suspicious for isolated metastasis.  Soft tissue mass was biopsied on May 19, 2020 which was negative for malignancy.  This lesion is still suspicious, so therefore we will continue to monitor closely. Patient underwent resection on July 12, 2020 which reviewed for positive lymph nodes increasing her stage IIb. Plan to give adjuvant chemotherapy using cisplatin and pemetrexed every 3 weeks for 4 cycles. Return to clinic in 2 weeks for further evaluation and additional treatment planning.  Patient reporting she is planning on starting treatment with oncology sometime this month.  She reports she still having ongoing aspects of shortness of breath.  She reports adherence to Trelegy Ellipta.  Overall she still overwhelmed regarding all of her work-up.  She reports she is taking this day by day.  Questionaires / Pulmonary Flowsheets:   ACT:  No flowsheet data found.  MMRC: No flowsheet data found.  Epworth:  No flowsheet data found.  Tests:   FENO:  No results found for: NITRICOXIDE  PFT: PFT Results Latest Ref Rng & Units 06/22/2020  FVC-Predicted Pre % 83  FVC-Post L 2.30  FVC-Predicted Post % 95  Pre FEV1/FVC % % 67  Post FEV1/FCV % % 62  FEV1-Pre L 1.35  FEV1-Predicted Pre % 71  FEV1-Post L 1.43  DLCO uncorrected ml/min/mmHg 17.61  DLCO UNC% % 93  DLVA Predicted % 103  TLC L 3.89  TLC % Predicted % 81  RV % Predicted % 87    WALK:  No flowsheet data found.  Imaging: DG Chest 2 View  Result Date: 07/27/2020 CLINICAL DATA:  Lung cancer EXAM: CHEST - 2 VIEW COMPARISON:  07/15/2020 FINDINGS: Status post  left lower lobectomy. Postsurgical changes at the left lung base. No other focal parenchymal opacity. No pleural effusion or pneumothorax. Stable cardiomediastinal silhouette. No acute osseous abnormality. IMPRESSION: No active cardiopulmonary disease. Postsurgical changes at the left lung base. Electronically Signed   By: Kathreen Devoid   On: 07/27/2020 14:32   DG Chest 2 View  Result Date: 07/15/2020 CLINICAL DATA:  Status post left lower lobectomy. EXAM: CHEST - 2 VIEW COMPARISON:  07/14/2020 FINDINGS: Subtle left-sided volume loss and left basilar atelectasis again noted. No confluent pulmonary infiltrate. No pneumothorax or pleural effusion. Cardiac size within normal limits. Pulmonary vascularity is normal. IMPRESSION: Left-sided volume loss.  No pneumothorax. Electronically Signed   By: Fidela Salisbury MD   On: 07/15/2020 06:24   DG Chest 2 View  Result Date: 07/08/2020 CLINICAL DATA:  Preoperative EXAM: CHEST - 2 VIEW COMPARISON:  None. FINDINGS: The heart size and mediastinal contours are within normal limits. There is a mass of the superior segment left lower lobe measuring 4.1 cm in projection. The visualized skeletal structures are unremarkable. IMPRESSION: 1.  No acute abnormality of the lungs. 2. There is a mass of the superior segment left lower lobe measuring 4.1 cm in projection. Electronically Signed   By: Eddie Candle M.D.   On: 07/08/2020 12:42   DG Chest Port 1 View  Result Date: 07/14/2020 CLINICAL DATA:  Chest tube removal. EXAM: PORTABLE CHEST 1 VIEW COMPARISON:  07/14/2020. FINDINGS: Interim removal of left chest tube. No pneumothorax. Mediastinum and hilar structures normal. Stable borderline cardiomegaly. Mild left base atelectasis. Mild right lung interstitial prominence again noted. IMPRESSION: 1. Interim removal of left chest tube. No pneumothorax. Mild left base atelectasis. 2.  Mild right lung interstitial prominence again noted. 3. Stable borderline cardiomegaly.  No  pulmonary venous congestion. Electronically Signed   By: Marcello Moores  Register   On: 07/14/2020 08:50   DG Chest Port 1 View  Result Date: 07/14/2020 CLINICAL DATA:  Chest tube.  History of lobectomy. EXAM: PORTABLE CHEST 1 VIEW COMPARISON:  07/13/2020. FINDINGS: Left chest tube in stable position. No pneumothorax. Low lung volumes with mild bibasilar atelectasis. Stable mild interstitial prominence right lung. No pleural effusion. Right costophrenic angle incompletely imaged. Stable borderline cardiomegaly. IMPRESSION: 1. Left chest tube in stable position. No pneumothorax. 2. Low lung volumes with mild bibasilar atelectasis. Stable mild interstitial prominence right lung. 3. Stable borderline cardiomegaly. Electronically Signed   By: Marcello Moores  Register   On: 07/14/2020 06:39   DG CHEST PORT 1 VIEW  Result Date: 07/13/2020 CLINICAL DATA:  Chest tube. EXAM: PORTABLE CHEST 1 VIEW COMPARISON:  07/12/2020. FINDINGS: Interim extubation. Left chest tube in stable position. No definite pneumothorax noted on today's exam. Interim resolution of right upper lung atelectasis. Low lung volumes. Mild right lung interstitial prominence. No prominent pleural effusion. Heart size stable. IMPRESSION: 1. Interim extubation. Left chest tube in stable position. No definite pneumothorax noted on today's exam. 2. Interim resolution of right upper lung atelectasis. Low lung volumes. Mild right lung interstitial prominence. Electronically Signed   By: Marcello Moores  Register   On: 07/13/2020 06:23   DG Chest Port 1 View  Result Date: 07/12/2020 CLINICAL DATA:  Postoperative for lobectomy EXAM: PORTABLE CHEST 1 VIEW COMPARISON:  07/08/2020 chest radiograph. FINDINGS: Left apical chest tube in place. Endotracheal tube tip is 2.8 cm above the carina. Stable cardiomediastinal silhouette with normal heart size. Tiny left apical pneumothorax, less than 5%. No right pneumothorax. No pleural effusion. Mild bibasilar atelectasis. Complete right  upper lobe atelectasis. IMPRESSION: 1. Tiny left apical pneumothorax, less than 5%. Left apical chest tube in place. 2. Well-positioned endotracheal tube. 3. Complete right upper lobe atelectasis. Follow-up chest radiographs suggested. 4. Mild bibasilar atelectasis. Electronically Signed   By: Ilona Sorrel M.D.   On: 07/12/2020 15:23    Lab Results:  CBC    Component Value Date/Time   WBC 6.7 07/14/2020 0130   RBC 2.72 (L) 07/14/2020 0130   HGB 8.5 (L) 07/14/2020 0130   HCT 26.4 (L) 07/14/2020 0130   PLT 116 (L) 07/14/2020 0130   MCV 97.1 07/14/2020 0130   MCH 31.3 07/14/2020 0130   MCHC 32.2 07/14/2020 0130   RDW 18.1 (H) 07/14/2020 0130    BMET    Component Value Date/Time   NA 137 07/14/2020 0130   K 3.7 07/14/2020 0130   CL 102 07/14/2020 0130   CO2 23 07/14/2020 0130   GLUCOSE 204 (H) 07/14/2020 0130   BUN 5 (L) 07/14/2020 0130   CREATININE 0.98 07/14/2020 0130   CALCIUM 6.9 (L) 07/14/2020 0130   GFRNONAA >60 07/14/2020 0130   GFRAA >60 01/30/2020 0636    BNP No results found for: BNP  ProBNP No results found for: PROBNP  Specialty Problems      Pulmonary Problems   Primary adenocarcinoma of lower lobe of left lung (HCC)   Left lower lobe pulmonary nodule   COPD mixed type (HCC)   OSA on CPAP      No Known Allergies  Immunization History  Administered Date(s) Administered  . Influenza,inj,Quad PF,6+ Mos 07/15/2020  . Moderna Sars-Covid-2 Vaccination 11/09/2019, 12/11/2019    Past Medical History:  Diagnosis Date  . Asthma   . COPD (chronic obstructive pulmonary disease) ([place])   . CVA (cerebral vascular accident) ([place]) 2014  . Depression   . Diabetes mellitus ([place])   . Diabetes mellitus without complication ([place])   . Hepatitis C   . History of kidney stones   . Hyperlipidemia   . Iron deficiency anemia   . Obesity   . OSA on CPAP    Mild, noncompliant with CPAP said cpap machine was recalled, is waiting for a replacement   . Paranoia ([place])    . Schizoaffective disorder, bipolar type ([place])     Tobacco History: Social History   Tobacco Use  Smoking Status Former Smoker  . Packs/day: 2.00  . Years: 38.00  . Pack years: 76.00  . Types: Cigarettes  . Quit date: 2012  . 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 acetaminophen (TYLENOL) 650 MG CR tablet Take 650 mg by mouth 2 (two) times daily.  Marland Kitchen albuterol (VENTOLIN HFA) 108 (90 Base) MCG/ACT inhaler Inhale 2 puffs into the lungs every 6 (six) hours as needed for wheezing or shortness of breath.  Marland Kitchen atorvastatin (LIPITOR) 40 MG tablet Take 40 mg by mouth at bedtime.  . benztropine (COGENTIN) 1 MG  tablet Take 1 mg by mouth at bedtime.  . citalopram (CELEXA) 20 MG tablet Take 20 mg by mouth every morning.  . Dulaglutide (TRULICITY) 1.5 GY/1.8HU SOPN Inject 1.5 mg into the skin every Friday.  . Fluticasone-Umeclidin-Vilant (TRELEGY ELLIPTA) 100-62.5-25 MCG/INH AEPB Inhale 1 puff into the lungs daily.  . Fluticasone-Umeclidin-Vilant (TRELEGY ELLIPTA) 100-62.5-25 MCG/INH AEPB Inhale 1 puff into the lungs daily for 1 day.  . gabapentin (NEURONTIN) 300 MG capsule Take 300 mg by mouth 2 (two) times daily.  . insulin aspart (NOVOLOG) 100 UNIT/ML FlexPen Inject 9 Units into the skin 3 (three) times daily with meals. (Patient taking differently: Inject 4 Units into the skin 3 (three) times daily with meals.)  . insulin detemir (LEVEMIR) 100 UNIT/ML injection Inject 15 Units into the skin 2 (two) times daily.  Marland Kitchen latanoprost (XALATAN) 0.005 % ophthalmic solution Place 1 drop into both eyes daily.  Marland Kitchen lidocaine (LIDODERM) 5 % Place 2 patches onto the skin See admin instructions. Apply 1 patch to each foot every 12 hours  . LORazepam (ATIVAN) 1 MG tablet Take 1 mg by mouth 2 (two) times daily.  Marland Kitchen LYRICA 75 MG capsule Take 1 capsule by mouth twice a  day  for nerve pain  . oxyCODONE (OXY IR/ROXICODONE) 5 MG immediate release tablet Take 1 tablet (5 mg total) by mouth every 4 (four) hours as needed for moderate pain.  . ziprasidone (GEODON) 80 MG capsule Take 80 mg by mouth at bedtime.   No facility-administered encounter medications on file as of 08/05/2020.     Review of Systems  Review of Systems  Constitutional: Positive for fatigue. Negative for activity change and fever.  HENT: Negative for sinus pressure, sinus pain and sore throat.   Respiratory: Positive for shortness of breath. Negative for cough and wheezing.   Cardiovascular: Negative for chest pain and palpitations.  Gastrointestinal: Negative for diarrhea, nausea and vomiting.  Musculoskeletal: Negative for arthralgias.  Neurological: Negative for dizziness.  Psychiatric/Behavioral: Negative for sleep disturbance. The patient is not nervous/anxious.      Physical Exam  BP 134/78 (BP Location: Left Arm, Cuff Size: Normal)   Pulse 62   Temp 98.2 F (36.8 C) (Temporal)   Ht 5\' 2"  (1.575 m)   Wt 237 lb 12.8 oz (107.9 kg)   SpO2 97%   BMI 43.49 kg/m   Wt Readings from Last 5 Encounters:  08/05/20 237 lb 12.8 oz (107.9 kg)  07/27/20 240 lb (108.9 kg)  07/27/20 240 lb (108.9 kg)  07/14/20 234 lb 2.1 oz (106.2 kg)  07/08/20 223 lb 9.6 oz (101.4 kg)    BMI Readings from Last 5 Encounters:  08/05/20 43.49 kg/m  07/27/20 43.90 kg/m  07/27/20 43.90 kg/m  07/14/20 42.82 kg/m  07/08/20 40.90 kg/m     Physical Exam Vitals and nursing note reviewed.  Constitutional:      General: She is not in acute distress.    Appearance: Normal appearance.  HENT:     Head: Normocephalic and atraumatic.     Right Ear: External ear normal.     Left Ear: External ear normal.     Nose: Nose normal. No congestion.     Mouth/Throat:     Mouth: Mucous membranes are moist.     Pharynx: Oropharynx is clear.  Eyes:     Pupils: Pupils are equal, round, and reactive to  light.  Cardiovascular:     Rate and Rhythm: Normal rate and regular rhythm.  Pulses: Normal pulses.     Heart sounds: Normal heart sounds. No murmur heard.   Pulmonary:     Breath sounds: No decreased air movement. Wheezing (slight exp wheezes L basilar > R ) present. No decreased breath sounds or rales.     Comments: Diminished breath sounds that exam Musculoskeletal:     Cervical back: Normal range of motion.     Right lower leg: Edema (trace) present.     Left lower leg: No edema.  Skin:    General: Skin is warm and dry.     Capillary Refill: Capillary refill takes less than 2 seconds.  Neurological:     General: No focal deficit present.     Mental Status: She is alert and oriented to person, place, and time. Mental status is at baseline.     Gait: Gait normal.  Psychiatric:        Mood and Affect: Mood normal. Affect is tearful.        Behavior: Behavior normal.        Thought Content: Thought content normal.        Judgment: Judgment normal.       Assessment & Plan:   OSA on CPAP Known sleep apnea Current CPAP is being replaced due to [facility] recall Patient is awaiting CPAP replacement  Plan: Continue to clinically monitor  COPD mixed type ([place]) Plan: Continue Trelegy Ellipta Work on increasing overall physical activity  Primary adenocarcinoma of lower lobe of left lung ([place]) Plan: Continue follow-up with oncology as well as cardiothoracic surgery     Return in about 3 months (around 11/02/2020), or if symptoms worsen or fail to improve, for [facility] - Dr. Patsey Berthold.   Lauraine Rinne, NP 08/05/2020   This appointment required 32 minutes of patient care (this includes precharting, chart review, review of results, face-to-face care, etc.).

## 2020-08-05 NOTE — Patient Instructions (Addendum)
You were seen today by Lauraine Rinne, NP  for:   1. COPD mixed type ([place])  Trelegy Ellipta  >>> 1 puff daily in the morning >>>rinse mouth out after use  >>> This inhaler contains 3 medications that help manage her respiratory status, contact our office if you cannot afford this medication or unable to remain on this medication  Only use your albuterol as a rescue medication to be used if you can't catch your breath by resting or doing a relaxed purse lip breathing pattern.  - The less you use it, the better it will work when you need it. - Ok to use up to 2 puffs  every 4 hours if you must but call for immediate appointment if use goes up over your usual need - Don't leave home without it !!  (think of it like the spare tire for your car)   Note your daily symptoms > remember "red flags" for COPD:   >>>Increase in cough >>>increase in sputum production >>>increase in shortness of breath or activity  intolerance.   If you notice these symptoms, please call the office to be seen.   2. Primary adenocarcinoma of lower lobe of left lung ([place])  Proceed forward with treatment as managed by oncology  3. OSA on CPAP  Please await for Hardin Negus to replace her CPAP machine  Let our office know if you are having difficulties obtaining this   Follow Up:    Return in about 3 months (around 11/02/2020), or if symptoms worsen or fail to improve, for [facility] - Dr. Patsey Berthold.   Notification of test results are managed in the following manner: If there are  any recommendations or changes to the  plan of care discussed in office today,  we will contact you and let you know what they are. If you do not hear from [place], then your results are normal and you can view them through your  MyChart account , or a letter will be sent to you. Thank you again for trusting [place] with your care  - Thank you, [facility]    It is flu season:   >>> Best ways to protect herself from the flu: Receive the  yearly flu vaccine, practice good hand hygiene washing with soap and also using hand sanitizer when available, eat a nutritious meals, get adequate rest, hydrate appropriately       Please contact the office if your symptoms worsen or you have concerns that you are not improving.   Thank you for choosing [facility] for your healthcare, and for allowing [place] to partner with you on your healthcare journey. I am thankful to be able to provide care to you today.   Wyn Quaker FNP-C

## 2020-08-06 NOTE — Progress Notes (Signed)
Agree with the details of the visit as noted by Brian Mack, NP.  C. Laura Lezley Bedgood, MD [facility] 

## 2020-08-08 NOTE — Progress Notes (Signed)
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   INTERVAL HISTORY: Natalie [name] returns to clinic today for further evaluation and additional treatment planning.  She is now fully recovered from her surgery and only has minimal pain. She has no neurologic complaints.  She denies any recent fevers or illnesses.  She has a good appetite and denies weight loss.  She has no chest pain, shortness of breath, cough, or hemoptysis.  She denies any nausea, vomiting, constipation, or diarrhea.  She has no urinary complaints.  Natalie [name] offers no further specific complaints today.  REVIEW OF SYSTEMS:   Review of Systems  Constitutional: Negative.  Negative for fever, malaise/fatigue and weight loss.  Respiratory: Negative.  Negative for cough, hemoptysis and shortness of breath.   Cardiovascular: Negative.  Negative for chest pain and leg swelling.  Gastrointestinal: Negative.  Negative for abdominal pain.  Genitourinary: Positive for flank pain. Negative for dysuria.  Musculoskeletal: Negative for back pain and joint pain.  Skin: Negative.  Negative for rash.  Neurological: Negative.  Negative for dizziness, focal weakness, weakness and headaches.  Psychiatric/Behavioral: Negative.  The Natalie [name] is not nervous/anxious.     As per HPI. Otherwise, a complete review of systems is negative.  PAST MEDICAL HISTORY: Past Medical History:  Diagnosis Date  . Asthma   . COPD (chronic obstructive pulmonary disease) ([place])   . CVA (cerebral vascular accident) ([place]) 2014  . Depression   . Diabetes mellitus ([place])   . Diabetes mellitus without complication ([place])   . Hepatitis C   . History of kidney  stones   . Hyperlipidemia   . Iron deficiency anemia   . Obesity   . OSA on CPAP    Mild, noncompliant with CPAP said cpap machine was recalled, is waiting for a replacement   . Paranoia ([place])   . Schizoaffective disorder, bipolar type ([place])     PAST SURGICAL HISTORY: Past Surgical History:  Procedure Laterality Date  . BREAST CYST EXCISION Bilateral 2013   Benign  . CESAREAN SECTION    . CHOLECYSTECTOMY    . COLONOSCOPY    . ESOPHAGOGASTRODUODENOSCOPY  2020   done in [place]  . INGUINAL HERNIA REPAIR Left 2002  . INTERCOSTAL NERVE BLOCK N/A 07/12/2020   Procedure: INTERCOSTAL NERVE BLOCK;  Surgeon: Melrose Nakayama, MD;  Location: [facility];  Service: Thoracic;  Laterality: N/A;  . LYMPH NODE DISSECTION Left 07/12/2020   Procedure: LYMPH NODE DISSECTION;  Surgeon: Melrose Nakayama, MD;  Location: [facility];  Service: Thoracic;  Laterality: Left;  . UMBILICAL HERNIA REPAIR    . VIDEO BRONCHOSCOPY WITH ENDOBRONCHIAL NAVIGATION N/A 06/07/2020   Procedure: VIDEO BRONCHOSCOPY WITH ENDOBRONCHIAL NAVIGATION;  Surgeon: Tyler Pita, MD;  Location: [facility];  Service: Pulmonary;  Laterality: N/A;    FAMILY HISTORY: History reviewed. No pertinent family history.  ADVANCED DIRECTIVES (Y/N):  N  HEALTH MAINTENANCE: Social History   Tobacco Use  . Smoking status: Former Smoker    Packs/day: 2.00    Years: 38.00    Pack years: 76.00    Types: Cigarettes    Quit date: 2012    Years since quitting: 10.1  . Smokeless tobacco: Former Network engineer  . Vaping Use:  Never used  Substance Use Topics  . Alcohol use: Yes    Comment: occassionaly  . Drug use: Not Currently     Colonoscopy:  PAP:  Bone density:  Lipid panel:  No Known Allergies  Current Outpatient Medications  Medication Sig Dispense Refill  . acetaminophen (TYLENOL) 650 MG CR tablet Take 650 mg by mouth 2 (two) times daily.    Marland Kitchen albuterol (VENTOLIN HFA) 108 (90 Base) MCG/ACT inhaler Inhale 2 puffs into the  lungs every 6 (six) hours as needed for wheezing or shortness of breath.    Marland Kitchen atorvastatin (LIPITOR) 40 MG tablet Take 40 mg by mouth at bedtime.    . benztropine (COGENTIN) 1 MG tablet Take 1 mg by mouth at bedtime.    . citalopram (CELEXA) 20 MG tablet Take 20 mg by mouth every morning.    . Dulaglutide (TRULICITY) 1.5 OJ/5.0KX SOPN Inject 1.5 mg into the skin every Friday.    . Fluticasone-Umeclidin-Vilant (TRELEGY ELLIPTA) 100-62.5-25 MCG/INH AEPB Inhale 1 puff into the lungs daily.    Marland Kitchen gabapentin (NEURONTIN) 300 MG capsule Take 300 mg by mouth 2 (two) times daily.    . insulin aspart (NOVOLOG) 100 UNIT/ML FlexPen Inject 9 Units into the skin 3 (three) times daily with meals. (Natalie [name] taking differently: Inject 4 Units into the skin 3 (three) times daily with meals.) 15 mL 11  . insulin detemir (LEVEMIR) 100 UNIT/ML injection Inject 15 Units into the skin 2 (two) times daily.    Marland Kitchen latanoprost (XALATAN) 0.005 % ophthalmic solution Place 1 drop into both eyes daily.    Marland Kitchen lidocaine (LIDODERM) 5 % Place 2 patches onto the skin See admin instructions. Apply 1 patch to each foot every 12 hours    . LORazepam (ATIVAN) 1 MG tablet Take 1 mg by mouth 2 (two) times daily.    Marland Kitchen LYRICA 75 MG capsule Take 1 capsule by mouth twice a day  for nerve pain    . oxyCODONE (OXY IR/ROXICODONE) 5 MG immediate release tablet Take 1 tablet (5 mg total) by mouth every 4 (four) hours as needed for moderate pain. 30 tablet 0  . ziprasidone (GEODON) 80 MG capsule Take 80 mg by mouth at bedtime.    . diazepam (VALIUM) 10 MG tablet Take 1 tablet by mouth prior to MRI scan. 2 tablet 0   No current facility-administered medications for this visit.    OBJECTIVE: Vitals:   08/12/20 1045  BP: (!) 100/47  Pulse: 81  Resp: 18  Temp: 98.2 F (36.8 C)  SpO2: 97%     Body mass index is 43.35 kg/m.    ECOG FS:0 - Asymptomatic  General: Well-developed, well-nourished, no acute distress. Eyes: Pink conjunctiva, anicteric  sclera. HEENT: Normocephalic, moist mucous membranes. Lungs: No audible wheezing or coughing. Heart: Regular rate and rhythm. Abdomen: Soft, nontender, no obvious distention. Musculoskeletal: No edema, cyanosis, or clubbing. Neuro: Alert, answering all questions appropriately. Cranial nerves grossly intact. Skin: No rashes or petechiae noted. Psych: Normal affect.   LAB RESULTS:  Lab Results  Component Value Date   NA 137 07/14/2020   K 3.7 07/14/2020   CL 102 07/14/2020   CO2 23 07/14/2020   GLUCOSE 204 (H) 07/14/2020   BUN 5 (L) 07/14/2020   CREATININE 0.98 07/14/2020   CALCIUM 6.9 (L) 07/14/2020   PROT 4.9 (L) 07/14/2020   ALBUMIN 2.8 (L) 07/14/2020   AST 49 (H) 07/14/2020   ALT 34 07/14/2020   ALKPHOS 43 07/14/2020  BILITOT 0.7 07/14/2020   GFRNONAA >60 07/14/2020   GFRAA >60 01/30/2020    Lab Results  Component Value Date   WBC 6.7 07/14/2020   HGB 8.5 (L) 07/14/2020   HCT 26.4 (L) 07/14/2020   MCV 97.1 07/14/2020   PLT 116 (L) 07/14/2020     STUDIES: DG Chest 2 View  Result Date: 07/27/2020 CLINICAL DATA:  Lung cancer EXAM: CHEST - 2 VIEW COMPARISON:  07/15/2020 FINDINGS: Status post left lower lobectomy. Postsurgical changes at the left lung base. No other focal parenchymal opacity. No pleural effusion or pneumothorax. Stable cardiomediastinal silhouette. No acute osseous abnormality. IMPRESSION: No active cardiopulmonary disease. Postsurgical changes at the left lung base. Electronically Signed   By: Kathreen Devoid   On: 07/27/2020 14:32   DG Chest 2 View  Result Date: 07/15/2020 CLINICAL DATA:  Status post left lower lobectomy. EXAM: CHEST - 2 VIEW COMPARISON:  07/14/2020 FINDINGS: Subtle left-sided volume loss and left basilar atelectasis again noted. No confluent pulmonary infiltrate. No pneumothorax or pleural effusion. Cardiac size within normal limits. Pulmonary vascularity is normal. IMPRESSION: Left-sided volume loss.  No pneumothorax. Electronically  Signed   By: Fidela Salisbury MD   On: 07/15/2020 06:24   DG Chest Port 1 View  Result Date: 07/14/2020 CLINICAL DATA:  Chest tube removal. EXAM: PORTABLE CHEST 1 VIEW COMPARISON:  07/14/2020. FINDINGS: Interim removal of left chest tube. No pneumothorax. Mediastinum and hilar structures normal. Stable borderline cardiomegaly. Mild left base atelectasis. Mild right lung interstitial prominence again noted. IMPRESSION: 1. Interim removal of left chest tube. No pneumothorax. Mild left base atelectasis. 2.  Mild right lung interstitial prominence again noted. 3. Stable borderline cardiomegaly.  No pulmonary venous congestion. 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   PLAN:    1. 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  Natalie [name] also noted to have a right iliac bony hypermetabolism as well as a soft tissue mass adjacent suspicious for isolated metastasis.  Soft tissue mass was biopsied on May 19, 2020 which was negative for malignancy.  This lesion is still suspicious, so therefore we will continue to monitor closely. Natalie [name] underwent resection on July 12, 2020 which reviewed for positive lymph nodes increasing her stage IIb.  Natalie [name] will require port placement and brain MRI prior to initiating treatment.  Plan to give adjuvant chemotherapy using cisplatin and pemetrexed every 3 weeks for 4 cycles.  Return to clinic in 2 weeks for further evaluation and consideration of cycle 1 of cisplatin and pemetrexed.    I spent a total of 30 minutes reviewing chart data, face-to-face evaluation with the Natalie [name], counseling and coordination of care as detailed above.   Natalie [name] expressed understanding and was in agreement  with this plan. She also understands that She can call clinic at any time with any questions, concerns, or complaints.   Cancer Staging Primary adenocarcinoma of lower lobe of left lung [facility]) Staging form: Lung, AJCC 8th Edition - Pathologic stage from 07/27/2020: Stage IIB (pT2b, pN1, cM0) - Signed by Lloyd Huger, MD on 07/27/2020   Lloyd Huger, MD   08/13/2020 6:01 AM

## 2020-08-12 ENCOUNTER — Inpatient Hospital Stay (HOSPITAL_BASED_OUTPATIENT_CLINIC_OR_DEPARTMENT_OTHER): Payer: Medicaid Other | Admitting: Oncology

## 2020-08-12 ENCOUNTER — Encounter: Payer: Self-pay | Admitting: Oncology

## 2020-08-12 ENCOUNTER — Inpatient Hospital Stay: Payer: Medicaid Other

## 2020-08-12 ENCOUNTER — Encounter: Payer: Self-pay | Admitting: *Deleted

## 2020-08-12 ENCOUNTER — Other Ambulatory Visit: Payer: Self-pay | Admitting: *Deleted

## 2020-08-12 VITALS — BP 100/47 | HR 81 | Temp 98.2°F | Resp 18 | Ht 62.0 in | Wt 237.0 lb

## 2020-08-12 DIAGNOSIS — C3432 Malignant neoplasm of lower lobe, left bronchus or lung: Secondary | ICD-10-CM | POA: Diagnosis not present

## 2020-08-12 DIAGNOSIS — C3490 Malignant neoplasm of unspecified part of unspecified bronchus or lung: Secondary | ICD-10-CM

## 2020-08-12 MED ORDER — DIAZEPAM 10 MG PO TABS: ORAL_TABLET | ORAL | 0 refills | Status: DC

## 2020-08-12 NOTE — Telephone Encounter (Signed)
patient declined study participation 08/11/20

## 2020-08-12 NOTE — Progress Notes (Signed)
  Oncology Nurse Navigator Documentation  Navigator Location: [facility] (08/12/20 1300)   )Navigator Encounter Type: Follow-up Appt (08/12/20 1300)                     Patient Visit Type: MedOnc (08/12/20 1300)   Barriers/Navigation Needs: Coordination of Care (08/12/20 1300)   Interventions: Coordination of Care (08/12/20 1300)   Coordination of Care: Appts;Chemo;Radiology (08/12/20 1300)           met with patient during follow up visit with Dr. Grayland Ormond to discuss chemotherapy. All questions answered during visit. Reviewed upcoming appts with patient. Informed that will be notified by phone with appt for port placement. Instructed pt to call with any further questions or needs. Pt verbalized understanding. Nothing further needed at this time.       Time Spent with Patient: 30 (08/12/20 1300)

## 2020-08-13 MED ORDER — ONDANSETRON HCL 8 MG PO TABS: 8.0000 mg | ORAL_TABLET | Freq: Two times a day (BID) | ORAL | 1 refills | Status: DC | PRN

## 2020-08-13 MED ORDER — PROCHLORPERAZINE MALEATE 10 MG PO TABS: 10.0000 mg | ORAL_TABLET | Freq: Four times a day (QID) | ORAL | 1 refills | Status: DC | PRN

## 2020-08-13 MED ORDER — LIDOCAINE-PRILOCAINE 2.5-2.5 % EX CREA: TOPICAL_CREAM | CUTANEOUS | 3 refills | Status: DC

## 2020-08-13 MED ORDER — FOLIC ACID 1 MG PO TABS: 1.0000 mg | ORAL_TABLET | Freq: Every day | ORAL | 3 refills | Status: DC

## 2020-08-17 NOTE — Progress Notes (Signed)
Called and spoke with patient and confirmed procedure date/time 08/20/2020 0730 arrival time for 0830 procedure time.  Discussed NPO after midnight day of procedure, no diabetic medication day of procedure, responsible adult driver. Patient states she no longer takes Novolog and only takes Levemir one time per day in the am and discussed she not take the Levemir morning of the procedure and she verbalized understanding.  Patient also verbalized understanding of NPO after midnight day of procedure, and son will driver her to be at [facility] at 0730 day of procedure. Called son, Ka-Lid and briefly updated him on procedure date/time 08/20/2020 arrive 0730 and he verbalized understanding.

## 2020-08-18 NOTE — Patient Instructions (Signed)
Pemetrexed injection What is this medicine? PEMETREXED (PEM e TREX ed) is a chemotherapy drug used to treat lung cancers like non-small cell lung cancer and mesothelioma. It may also be used to treat other cancers. This medicine may be used for other purposes; ask your health care provider or pharmacist if you have questions. COMMON BRAND NAME(S): Alimta What should I tell my health care provider before I take this medicine? They need to know if you have any of these conditions:  infection (especially a virus infection such as chickenpox, cold sores, or herpes)  kidney disease  low blood counts, like low white cell, platelet, or red cell counts  lung or breathing disease, like asthma  radiation therapy  an unusual or allergic reaction to pemetrexed, other medicines, foods, dyes, or preservative  pregnant or trying to get pregnant  breast-feeding How should I use this medicine? This drug is given as an infusion into a vein. It is administered in a hospital or clinic by a specially trained health care professional. Talk to your pediatrician regarding the use of this medicine in children. Special care may be needed. Overdosage: If you think you have taken too much of this medicine contact a poison control center or emergency room at once. NOTE: This medicine is only for you. Do not share this medicine with others. What if I miss a dose? It is important not to miss your dose. Call your doctor or health care professional if you are unable to keep an appointment. What may interact with this medicine? This medicine may interact with the following medications:  Ibuprofen This list may not describe all possible interactions. Give your health care provider a list of all the medicines, herbs, non-prescription drugs, or dietary supplements you use. Also tell them if you smoke, drink alcohol, or use illegal drugs. Some items may interact with your medicine. What should I watch for while using  this medicine? Visit your doctor for checks on your progress. This drug may make you feel generally unwell. This is not uncommon, as chemotherapy can affect healthy cells as well as cancer cells. Report any side effects. Continue your course of treatment even though you feel ill unless your doctor tells you to stop. In some cases, you may be given additional medicines to help with side effects. Follow all directions for their use. Call your doctor or health care professional for advice if you get a fever, chills or sore throat, or other symptoms of a cold or flu. Do not treat yourself. This drug decreases your body's ability to fight infections. Try to avoid being around people who are sick. This medicine may increase your risk to bruise or bleed. Call your doctor or health care professional if you notice any unusual bleeding. Be careful brushing and flossing your teeth or using a toothpick because you may get an infection or bleed more easily. If you have any dental work done, tell your dentist you are receiving this medicine. Avoid taking products that contain aspirin, acetaminophen, ibuprofen, naproxen, or ketoprofen unless instructed by your doctor. These medicines may hide a fever. Call your doctor or health care professional if you get diarrhea or mouth sores. Do not treat yourself. To protect your kidneys, drink water or other fluids as directed while you are taking this medicine. Do not become pregnant while taking this medicine or for 6 months after stopping it. Women should inform their doctor if they wish to become pregnant or think they might be pregnant. Men should  not father a child while taking this medicine and for 3 months after stopping it. This may interfere with the ability to father a child. You should talk to your doctor or health care professional if you are concerned about your fertility. There is a potential for serious side effects to an unborn child. Talk to your health care  professional or pharmacist for more information. Do not breast-feed an infant while taking this medicine or for 1 week after stopping it. What side effects may I notice from receiving this medicine? Side effects that you should report to your doctor or health care professional as soon as possible:  allergic reactions like skin rash, itching or hives, swelling of the face, lips, or tongue  breathing problems  redness, blistering, peeling or loosening of the skin, including inside the mouth  signs and symptoms of bleeding such as bloody or black, tarry stools; red or dark-brown urine; spitting up blood or brown material that looks like coffee grounds; red spots on the skin; unusual bruising or bleeding from the eye, gums, or nose  signs and symptoms of infection like fever or chills; cough; sore throat; pain or trouble passing urine  signs and symptoms of kidney injury like trouble passing urine or change in the amount of urine  signs and symptoms of liver injury like dark yellow or brown urine; general ill feeling or flu-like symptoms; light-colored stools; loss of appetite; nausea; right upper belly pain; unusually weak or tired; yellowing of the eyes or skin Side effects that usually do not require medical attention (report to your doctor or health care professional if they continue or are bothersome):  constipation  mouth sores  nausea, vomiting  unusually weak or tired This list may not describe all possible side effects. Call your doctor for medical advice about side effects. You may report side effects to FDA at 1-800-FDA-1088. Where should I keep my medicine? This drug is given in a hospital or clinic and will not be stored at home. NOTE: This sheet is a summary. It may not cover all possible information. If you have questions about this medicine, talk to your doctor, pharmacist, or health care provider.  2021 Elsevier/Gold Standard (2017-07-25 16:11:33) Cisplatin  injection What is this medicine? CISPLATIN (SIS pla tin) is a chemotherapy drug. It targets fast dividing cells, like cancer cells, and causes these cells to die. This medicine is used to treat many types of cancer like bladder, ovarian, and testicular cancers. This medicine may be used for other purposes; ask your health care provider or pharmacist if you have questions. COMMON BRAND NAME(S): Platinol, Platinol -AQ What should I tell my health care provider before I take this medicine? They need to know if you have any of these conditions:  eye disease, vision problems  hearing problems  kidney disease  low blood counts, like white cells, platelets, or red blood cells  tingling of the fingers or toes, or other nerve disorder  an unusual or allergic reaction to cisplatin, carboplatin, oxaliplatin, other medicines, foods, dyes, or preservatives  pregnant or trying to get pregnant  breast-feeding How should I use this medicine? This drug is given as an infusion into a vein. It is administered in a hospital or clinic by a specially trained health care professional. Talk to your pediatrician regarding the use of this medicine in children. Special care may be needed. Overdosage: If you think you have taken too much of this medicine contact a poison control center or emergency room  at once. NOTE: This medicine is only for you. Do not share this medicine with others. What if I miss a dose? It is important not to miss a dose. Call your doctor or health care professional if you are unable to keep an appointment. What may interact with this medicine? This medicine may interact with the following medications:  foscarnet  certain antibiotics like amikacin, gentamicin, neomycin, polymyxin B, streptomycin, tobramycin, vancomycin This list may not describe all possible interactions. Give your health care provider a list of all the medicines, herbs, non-prescription drugs, or dietary supplements  you use. Also tell them if you smoke, drink alcohol, or use illegal drugs. Some items may interact with your medicine. What should I watch for while using this medicine? Your condition will be monitored carefully while you are receiving this medicine. You will need important blood work done while you are taking this medicine. This drug may make you feel generally unwell. This is not uncommon, as chemotherapy can affect healthy cells as well as cancer cells. Report any side effects. Continue your course of treatment even though you feel ill unless your doctor tells you to stop. This medicine may increase your risk of getting an infection. Call your healthcare professional for advice if you get a fever, chills, or sore throat, or other symptoms of a cold or flu. Do not treat yourself. Try to avoid being around people who are sick. Avoid taking medicines that contain aspirin, acetaminophen, ibuprofen, naproxen, or ketoprofen unless instructed by your healthcare professional. These medicines may hide a fever. This medicine may increase your risk to bruise or bleed. Call your doctor or health care professional if you notice any unusual bleeding. Be careful brushing and flossing your teeth or using a toothpick because you may get an infection or bleed more easily. If you have any dental work done, tell your dentist you are receiving this medicine. Do not become pregnant while taking this medicine or for 14 months after stopping it. Women should inform their healthcare professional if they wish to become pregnant or think they might be pregnant. Men should not father a child while taking this medicine and for 11 months after stopping it. There is potential for serious side effects to an unborn child. Talk to your healthcare professional for more information. Do not breast-feed an infant while taking this medicine. This medicine has caused ovarian failure in some women. This medicine may make it more difficult to  get pregnant. Talk to your healthcare professional if you are concerned about your fertility. This medicine has caused decreased sperm counts in some men. This may make it more difficult to father a child. Talk to your healthcare professional if you are concerned about your fertility. Drink fluids as directed while you are taking this medicine. This will help protect your kidneys. Call your doctor or health care professional if you get diarrhea. Do not treat yourself. What side effects may I notice from receiving this medicine? Side effects that you should report to your doctor or health care professional as soon as possible:  allergic reactions like skin rash, itching or hives, swelling of the face, lips, or tongue  blurred vision  changes in vision  decreased hearing or ringing of the ears  nausea, vomiting  pain, redness, or irritation at site where injected  pain, tingling, numbness in the hands or feet  signs and symptoms of bleeding such as bloody or black, tarry stools; red or dark brown urine; spitting up blood or  brown material that looks like coffee grounds; red spots on the skin; unusual bruising or bleeding from the eyes, gums, or nose  signs and symptoms of infection like fever; chills; cough; sore throat; pain or trouble passing urine  signs and symptoms of kidney injury like trouble passing urine or change in the amount of urine  signs and symptoms of low red blood cells or anemia such as unusually weak or tired; feeling faint or lightheaded; falls; breathing problems Side effects that usually do not require medical attention (report to your doctor or health care professional if they continue or are bothersome):  loss of appetite  mouth sores  muscle cramps This list may not describe all possible side effects. Call your doctor for medical advice about side effects. You may report side effects to FDA at 1-800-FDA-1088. Where should I keep my medicine? This drug is  given in a hospital or clinic and will not be stored at home. NOTE: This sheet is a summary. It may not cover all possible information. If you have questions about this medicine, talk to your doctor, pharmacist, or health care provider.  2021 Elsevier/Gold Standard (2018-05-31 15:59:17)

## 2020-08-19 ENCOUNTER — Inpatient Hospital Stay (HOSPITAL_BASED_OUTPATIENT_CLINIC_OR_DEPARTMENT_OTHER): Payer: Medicaid Other | Admitting: Nurse Practitioner

## 2020-08-19 ENCOUNTER — Other Ambulatory Visit: Payer: Self-pay | Admitting: Radiology

## 2020-08-19 ENCOUNTER — Other Ambulatory Visit: Payer: Self-pay

## 2020-08-19 ENCOUNTER — Inpatient Hospital Stay: Payer: Medicaid Other | Attending: Oncology

## 2020-08-19 DIAGNOSIS — Z79899 Other long term (current) drug therapy: Secondary | ICD-10-CM | POA: Insufficient documentation

## 2020-08-19 DIAGNOSIS — C3432 Malignant neoplasm of lower lobe, left bronchus or lung: Secondary | ICD-10-CM | POA: Diagnosis not present

## 2020-08-19 DIAGNOSIS — Z5111 Encounter for antineoplastic chemotherapy: Secondary | ICD-10-CM | POA: Insufficient documentation

## 2020-08-19 DIAGNOSIS — Z87891 Personal history of nicotine dependence: Secondary | ICD-10-CM | POA: Insufficient documentation

## 2020-08-19 DIAGNOSIS — D72819 Decreased white blood cell count, unspecified: Secondary | ICD-10-CM | POA: Insufficient documentation

## 2020-08-19 DIAGNOSIS — D649 Anemia, unspecified: Secondary | ICD-10-CM | POA: Insufficient documentation

## 2020-08-19 NOTE — H&P (Signed)
Chief Complaint: Lung cancer  Referring Physician(s): Dr. Grayland Ormond  Supervising Physician: Sandi Mariscal  Patient Status: [facility] - Out-pt  History of Present Illness: Natalie [name] is a 60 y.o. female with recent diagnosis of non-small cell lung cancer of the left lung.  She is known to our service.  She underwent CT-guided biopsy of a hypermetabolic spiculated soft tissue mass adjacent to the anterior superior aspect of the right hilum.  Non-small cell carcinoma, favor adenocarcinoma.  She is n.p.o. No nausea/vomiting. No Fever/chills. ROS negative.   Past Medical History:  Diagnosis Date   Asthma    COPD (chronic obstructive pulmonary disease) ([place])    CVA (cerebral vascular accident) ([place]) 2014   Depression    Diabetes mellitus ([place])    Diabetes mellitus without complication ([place])    Hepatitis C    History of kidney stones    Hyperlipidemia    Iron deficiency anemia    Obesity    OSA on CPAP    Mild, noncompliant with CPAP said cpap machine was recalled, is waiting for a replacement    Paranoia ([place])    Schizoaffective disorder, bipolar type ([place])     Past Surgical History:  Procedure Laterality Date   BREAST CYST EXCISION Bilateral 2013   Benign   CESAREAN SECTION     CHOLECYSTECTOMY     COLONOSCOPY     ESOPHAGOGASTRODUODENOSCOPY  2020   done in [place] Left 2002   INTERCOSTAL NERVE BLOCK N/A 07/12/2020   Procedure: INTERCOSTAL NERVE BLOCK;  Surgeon: Melrose Nakayama, MD;  Location: [place];  Service: Thoracic;  Laterality: N/A;   LYMPH NODE DISSECTION Left 07/12/2020   Procedure: LYMPH NODE DISSECTION;  Surgeon: Melrose Nakayama, MD;  Location: [place];  Service: Thoracic;  Laterality: Left;   UMBILICAL HERNIA REPAIR     VIDEO BRONCHOSCOPY WITH ENDOBRONCHIAL NAVIGATION N/A 06/07/2020   Procedure: VIDEO BRONCHOSCOPY WITH ENDOBRONCHIAL NAVIGATION;  Surgeon: Tyler Pita, MD;  Location: [facility];   Service: Pulmonary;  Laterality: N/A;    Allergies: Patient has no known allergies.  Medications: Prior to Admission medications   Medication Sig Start Date End Date Taking? Authorizing Provider  acetaminophen (TYLENOL) 650 MG CR tablet Take 650 mg by mouth 2 (two) times daily.    [provider]  albuterol (VENTOLIN HFA) 108 (90 Base) MCG/ACT inhaler Inhale 2 puffs into the lungs every 6 (six) hours as needed for wheezing or shortness of breath. 04/08/20   [provider]  atorvastatin (LIPITOR) 40 MG tablet Take 40 mg by mouth at bedtime. 01/15/20   [provider]  benztropine (COGENTIN) 1 MG tablet Take 1 mg by mouth at bedtime. 01/15/20   [provider]  citalopram (CELEXA) 20 MG tablet Take 20 mg by mouth every morning. 01/15/20   [provider]  diazepam (VALIUM) 10 MG tablet Take 1 tablet by mouth prior to MRI scan. 08/12/20   Lloyd Huger, MD  Dulaglutide (TRULICITY) 1.5 PJ/0.9TO SOPN Inject 1.5 mg into the skin every Friday.    [provider]  Fluticasone-Umeclidin-Vilant (TRELEGY ELLIPTA) 100-62.5-25 MCG/INH AEPB Inhale 1 puff into the lungs daily.    [provider]  folic acid (FOLVITE) 1 MG tablet Take 1 tablet (1 mg total) by mouth daily. Start 5-7 days before Alimta chemotherapy. Continue until 21 days after Alimta completed. 08/13/20   Lloyd Huger, MD  gabapentin (NEURONTIN) 300 MG capsule Take 300 mg by mouth  2 (two) times daily.    [provider]  insulin aspart (NOVOLOG) 100 UNIT/ML FlexPen Inject 9 Units into the skin 3 (three) times daily with meals. Patient taking differently: Inject 4 Units into the skin 3 (three) times daily with meals. 01/30/20   Barb Merino, MD  insulin detemir (LEVEMIR) 100 UNIT/ML injection Inject 15 Units into the skin 2 (two) times daily.    [provider]  latanoprost (XALATAN) 0.005 % ophthalmic solution Place 1 drop into both eyes daily. 01/15/20    [provider]  lidocaine (LIDODERM) 5 % Place 2 patches onto the skin See admin instructions. Apply 1 patch to each foot every 12 hours 04/13/20   [provider]  lidocaine-prilocaine (EMLA) cream Apply to affected area once 08/13/20   Lloyd Huger, MD  LORazepam (ATIVAN) 1 MG tablet Take 1 mg by mouth 2 (two) times daily. 12/17/19   [provider]  LYRICA 75 MG capsule Take 1 capsule by mouth twice a day  for nerve pain 07/02/20   [provider]  ondansetron (ZOFRAN) 8 MG tablet Take 1 tablet (8 mg total) by mouth 2 (two) times daily as needed (Nausea or vomiting). Start if needed on the third day after cisplatin. 08/13/20   Lloyd Huger, MD  oxyCODONE (OXY IR/ROXICODONE) 5 MG immediate release tablet Take 1 tablet (5 mg total) by mouth every 4 (four) hours as needed for moderate pain. 07/27/20   Melrose Nakayama, MD  prochlorperazine (COMPAZINE) 10 MG tablet Take 1 tablet (10 mg total) by mouth every 6 (six) hours as needed (Nausea or vomiting). 08/13/20   Lloyd Huger, MD  ziprasidone (GEODON) 80 MG capsule Take 80 mg by mouth at bedtime.    [provider]     History reviewed. No pertinent family history.  Social History   Socioeconomic History   Marital status: Single    Spouse name: Not on file   Number of children: Not on file   Years of education: Not on file   Highest education level: Not on file  Occupational History   Not on file  Tobacco Use   Smoking status: Former Smoker    Packs/day: 2.00    Years: 38.00    Pack years: 76.00    Types: Cigarettes    Quit date: 2012    Years since quitting: 10.1   Smokeless tobacco: Former Counsellor Use: Never used  Substance and Sexual Activity   Alcohol use: Yes    Comment: occassionaly   Drug use: Not Currently   Sexual activity: Not on file  Other Topics Concern   Not on file  Social History Narrative   Not on file   Social  Determinants of Health   Financial Resource Strain: Not on file  Food Insecurity: Not on file  Transportation Needs: Not on file  Physical Activity: Not on file  Stress: Not on file  Social Connections: Not on file     Review of Systems: A 12 point ROS discussed and pertinent positives are indicated in the HPI above.  All other systems are negative.  Review of Systems  Vital Signs: BP (!) 124/57    Pulse 84    Temp 98.6 F (37 C) (Oral)    Resp 17    Ht 5\' 2"  (1.575 m)    Wt 107 kg    SpO2 98%    BMI 43.16 kg/m   Physical Exam Vitals  reviewed.  Constitutional:      Appearance: She is obese.  HENT:     Head: Normocephalic and atraumatic.  Eyes:     Extraocular Movements: Extraocular movements intact.  Cardiovascular:     Rate and Rhythm: Normal rate and regular rhythm.  Pulmonary:     Effort: Pulmonary effort is normal. No respiratory distress.     Breath sounds: Normal breath sounds.  Abdominal:     General: There is no distension.     Palpations: Abdomen is soft.     Tenderness: There is no abdominal tenderness.  Musculoskeletal:        General: Normal range of motion.  Skin:    General: Skin is warm and dry.  Neurological:     General: No focal deficit present.     Mental Status: She is alert and oriented to person, place, and time.  Psychiatric:        Mood and Affect: Mood normal.        Behavior: Behavior normal.        Thought Content: Thought content normal.        Judgment: Judgment normal.     Imaging: DG Chest 2 View  Result Date: 07/27/2020 CLINICAL DATA:  Lung cancer EXAM: CHEST - 2 VIEW COMPARISON:  07/15/2020 FINDINGS: Status post left lower lobectomy. Postsurgical changes at the left lung base. No other focal parenchymal opacity. No pleural effusion or pneumothorax. Stable cardiomediastinal silhouette. No acute osseous abnormality. IMPRESSION: No active cardiopulmonary disease. Postsurgical changes at the left lung base. Electronically Signed   By:  Kathreen Devoid   On: 07/27/2020 14:32    Labs:  CBC: Recent Labs    07/08/20 1111 07/12/20 1350 07/12/20 1542 07/12/20 2022 07/13/20 0500 07/14/20 0130  WBC 9.4  --  16.6*  --  9.0 6.7  HGB 12.7   < > 10.8* 10.3* 9.0* 8.5*  HCT 36.2   < > 35.2* 31.0* 28.3* 26.4*  PLT 197  --  196  --  128* 116*   < > = values in this interval not displayed.    COAGS: Recent Labs    05/19/20 1045 07/08/20 1111  INR 1.0 1.2  APTT  --  32    BMP: Recent Labs    01/27/20 0440 01/28/20 0518 01/29/20 0427 01/30/20 0636 06/03/20 1236 07/08/20 1111 07/12/20 1350 07/12/20 1353 07/12/20 1542 07/13/20 0500 07/14/20 0130  NA 144 148* 146* 143   < > 130* 136 136 136 137 137  K 3.0* 3.6 3.6 2.9*   < > 4.6 5.1 5.1 4.5 3.6 3.7  CL 104 110 109 102   < > 89* 97*  --  99 99 102  CO2 28 29 29 28    < > 17*  --   --  24 25 23   GLUCOSE 219* 172* 205* 255*   < > 379* 311*  --  228* 183* 204*  BUN 20 11 7 8    < > 15 9  --  8 <5* 5*  CALCIUM 8.6* 8.5* 8.5* 8.9   < > 9.4  --   --  7.8* 7.3* 6.9*  CREATININE 1.27* 0.95 0.85 0.89   < > 0.85 0.80  --  0.99 0.83 0.98  GFRNONAA 46* >60 >60 >60   < > >60  --   --  >60 >60 >60  GFRAA 53* >60 >60 >60  --   --   --   --   --   --   --    < > =  values in this interval not displayed.    LIVER FUNCTION TESTS: Recent Labs    01/20/20 2216 01/26/20 0531 07/08/20 1111 07/12/20 0743 07/14/20 0130  BILITOT 2.5*  --  1.8* 1.0 0.7  AST 26  --  174* 57* 49*  ALT 23  --  143* 64* 34  ALKPHOS 89  --  91 79 43  PROT 7.6  --  7.5 6.4* 4.9*  ALBUMIN 4.2 2.6* 4.5 3.8 2.8*    TUMOR MARKERS: No results for input(s): AFPTM, CEA, CA199, CHROMGRNA in the last 8760 hours.  Assessment and Plan:  Non-small cell lung cancer.  We will proceed with image guided placement of a tunneled catheter with port by Dr. Pascal Lux today.  Risks and benefits of image guided port-a-catheter placement was discussed with the patient including, but not limited to bleeding, infection,  pneumothorax, or fibrin sheath development and need for additional procedures.  All of the patient's questions were answered, patient is agreeable to proceed. Consent signed and in chart.  Thank you for this interesting consult.  I greatly enjoyed meeting Torunn Chancellor and look forward to participating in their care.  A copy of this report was sent to the requesting provider on this date.  Electronically Signed: Murrell Redden, PA-C   08/20/2020, 8:33 AM      I spent a total of   25 Minutes in face to face in clinical consultation, greater than 50% of which was counseling/coordinating care for for placement of tunneled catheter with port.

## 2020-08-19 NOTE — Progress Notes (Signed)
Virtual Visit Progress Note  [place] NOTE [facility]  Telephone:([phone] [phone]  Patient Care Team: Center, [facility] as PCP - General Telford Nab, RN as Oncology Nurse Navigator   Name of the patient: Natalie [name]  [id number]  November 25, 1960   Date of visit: 08/19/20  I connected with Karma Greaser on 08/19/20 at 11:00 AM EST by telephone visit and verified that I am speaking with the correct person using two identifiers.   I discussed the limitations, risks, security and privacy concerns of performing an evaluation and management service by telemedicine and the availability of in-person appointments. I also discussed with the patient that there may be a patient responsible charge related to this service. The patient expressed understanding and agreed to proceed.   Other persons participating in the visit and their role in the encounter: Magdalene Patricia, Therapist, sports (Nurse Navigator & Chemo Education)  Patient's location: home Provider's location: clinic  Diagnosis- Lung Cancer  Chief complaint/Reason for visit- Initial Meeting for [facility], preparing for starting chemotherapy  Heme/Onc history:  Oncology History  Primary adenocarcinoma of lower lobe of left lung ([place])  05/08/2020 Initial Diagnosis   Primary adenocarcinoma of lower lobe of left lung ([place])   07/27/2020 Cancer Staging   Staging form: Lung, AJCC 8th Edition - Pathologic stage from 07/27/2020: Stage IIB (pT2b, pN1, cM0) - Signed by Lloyd Huger, MD on 07/27/2020   08/26/2020 -  Chemotherapy    Patient is on Treatment Plan: LUNG NSCLC PEMETREXED (ALIMTA) + CISPLATIN Q21D X 4 CYCLES        Interval history-  Patient is 60 year old female recently diagnosed with lung cancer, who presents to chemo care clinic today for initial meeting in preparation for starting chemotherapy. I introduced the chemo care clinic and we discussed  that the role of the clinic is to assist those who are at an increased risk of emergency room visits and/or complications during the course of chemotherapy treatment. We discussed that the increased risk takes into account factors such as age, performance status, and co-morbidities. We also discussed that for some, this might include barriers to care such as not having a primary care provider, lack of insurance/transportation, or not being able to afford medications. We discussed that the goal of the program is to help prevent unplanned ER visits and help reduce complications during chemotherapy. We do this by discussing specific risk factors to each individual and identifying ways that we can help improve these risk factors and reduce barriers to care.   Review of systems- Review of Systems  Constitutional: Negative for chills, fever, malaise/fatigue and weight loss.  HENT: Negative for hearing loss, nosebleeds, sore throat and tinnitus.   Eyes: Negative for blurred vision and double vision.  Respiratory: Negative for cough, hemoptysis, shortness of breath and wheezing.   Cardiovascular: Negative for chest pain, palpitations and leg swelling.  Gastrointestinal: Negative for abdominal pain, blood in stool, constipation, diarrhea, melena, nausea and vomiting.  Genitourinary: Negative for dysuria and urgency.  Musculoskeletal: Negative for back pain, falls, joint pain and myalgias.  Skin: Negative for itching and rash.  Neurological: Negative for dizziness, tingling, sensory change, loss of consciousness, weakness and headaches.  Endo/Heme/Allergies: Negative for environmental allergies. Does not bruise/bleed easily.  Psychiatric/Behavioral: Negative for depression. The patient is not nervous/anxious and does not have insomnia.      No Known Allergies  Past Medical History:  Diagnosis Date  . Asthma   .  COPD (chronic obstructive pulmonary disease) ([place])   . CVA (cerebral vascular accident) ([place])  2014  . Depression   . Diabetes mellitus ([place])   . Diabetes mellitus without complication ([place])   . Hepatitis C   . History of kidney stones   . Hyperlipidemia   . Iron deficiency anemia   . Obesity   . OSA on CPAP    Mild, noncompliant with CPAP said cpap machine was recalled, is waiting for a replacement   . Paranoia ([place])   . Schizoaffective disorder, bipolar type [facility])     Past Surgical History:  Procedure Laterality Date  . BREAST CYST EXCISION Bilateral 2013   Benign  . CESAREAN SECTION    . CHOLECYSTECTOMY    . COLONOSCOPY    . ESOPHAGOGASTRODUODENOSCOPY  2020   done in [place]  . INGUINAL HERNIA REPAIR Left 2002  . INTERCOSTAL NERVE BLOCK N/A 07/12/2020   Procedure: INTERCOSTAL NERVE BLOCK;  Surgeon: Melrose Nakayama, MD;  Location: [place];  Service: Thoracic;  Laterality: N/A;  . LYMPH NODE DISSECTION Left 07/12/2020   Procedure: LYMPH NODE DISSECTION;  Surgeon: Melrose Nakayama, MD;  Location: [place];  Service: Thoracic;  Laterality: Left;  . UMBILICAL HERNIA REPAIR    . VIDEO BRONCHOSCOPY WITH ENDOBRONCHIAL NAVIGATION N/A 06/07/2020   Procedure: VIDEO BRONCHOSCOPY WITH ENDOBRONCHIAL NAVIGATION;  Surgeon: Tyler Pita, MD;  Location: [facility];  Service: Pulmonary;  Laterality: N/A;    Social History   Socioeconomic History  . Marital status: Single    Spouse name: Not on file  . Number of children: Not on file  . Years of education: Not on file  . Highest education level: Not on file  Occupational History  . Not on file  Tobacco Use  . Smoking status: Former Smoker    Packs/day: 2.00    Years: 38.00    Pack years: 76.00    Types: Cigarettes    Quit date: 2012    Years since quitting: 10.1  . Smokeless tobacco: Former Network engineer  . Vaping Use: Never used  Substance and Sexual Activity  . Alcohol use: Yes    Comment: occassionaly  . Drug use: Not Currently  . Sexual activity: Not on file  Other Topics Concern  . Not on file  Social  History Narrative  . Not on file   Social Determinants of Health   Financial Resource Strain: Not on file  Food Insecurity: Not on file  Transportation Needs: Not on file  Physical Activity: Not on file  Stress: Not on file  Social Connections: Not on file  Intimate Partner Violence: Not on file    No family history on file.   Current Outpatient Medications:  .  acetaminophen (TYLENOL) 650 MG CR tablet, Take 650 mg by mouth 2 (two) times daily., Disp: , Rfl:  .  albuterol (VENTOLIN HFA) 108 (90 Base) MCG/ACT inhaler, Inhale 2 puffs into the lungs every 6 (six) hours as needed for wheezing or shortness of breath., Disp: , Rfl:  .  atorvastatin (LIPITOR) 40 MG tablet, Take 40 mg by mouth at bedtime., Disp: , Rfl:  .  benztropine (COGENTIN) 1 MG tablet, Take 1 mg by mouth at bedtime., Disp: , Rfl:  .  citalopram (CELEXA) 20 MG tablet, Take 20 mg by mouth every morning., Disp: , Rfl:  .  diazepam (VALIUM) 10 MG tablet, Take 1 tablet by mouth prior to MRI scan., Disp: 2 tablet, Rfl: 0 .  Dulaglutide (TRULICITY) 1.5 DT/2.6ZT SOPN, Inject 1.5 mg into the skin every Friday., Disp: , Rfl:  .  Fluticasone-Umeclidin-Vilant (TRELEGY ELLIPTA) 100-62.5-25 MCG/INH AEPB, Inhale 1 puff into the lungs daily., Disp: , Rfl:  .  folic acid (FOLVITE) 1 MG tablet, Take 1 tablet (1 mg total) by mouth daily. Start 5-7 days before Alimta chemotherapy. Continue until 21 days after Alimta completed., Disp: 100 tablet, Rfl: 3 .  gabapentin (NEURONTIN) 300 MG capsule, Take 300 mg by mouth 2 (two) times daily., Disp: , Rfl:  .  insulin aspart (NOVOLOG) 100 UNIT/ML FlexPen, Inject 9 Units into the skin 3 (three) times daily with meals. (Patient taking differently: Inject 4 Units into the skin 3 (three) times daily with meals.), Disp: 15 mL, Rfl: 11 .  insulin detemir (LEVEMIR) 100 UNIT/ML injection, Inject 15 Units into the skin 2 (two) times daily., Disp: , Rfl:  .  latanoprost (XALATAN) 0.005 % ophthalmic solution,  Place 1 drop into both eyes daily., Disp: , Rfl:  .  lidocaine (LIDODERM) 5 %, Place 2 patches onto the skin See admin instructions. Apply 1 patch to each foot every 12 hours, Disp: , Rfl:  .  lidocaine-prilocaine (EMLA) cream, Apply to affected area once, Disp: 30 g, Rfl: 3 .  LORazepam (ATIVAN) 1 MG tablet, Take 1 mg by mouth 2 (two) times daily., Disp: , Rfl:  .  LYRICA 75 MG capsule, Take 1 capsule by mouth twice a day  for nerve pain, Disp: , Rfl:  .  ondansetron (ZOFRAN) 8 MG tablet, Take 1 tablet (8 mg total) by mouth 2 (two) times daily as needed (Nausea or vomiting). Start if needed on the third day after cisplatin., Disp: 60 tablet, Rfl: 1 .  oxyCODONE (OXY IR/ROXICODONE) 5 MG immediate release tablet, Take 1 tablet (5 mg total) by mouth every 4 (four) hours as needed for moderate pain., Disp: 30 tablet, Rfl: 0 .  prochlorperazine (COMPAZINE) 10 MG tablet, Take 1 tablet (10 mg total) by mouth every 6 (six) hours as needed (Nausea or vomiting)., Disp: 60 tablet, Rfl: 1 .  ziprasidone (GEODON) 80 MG capsule, Take 80 mg by mouth at bedtime., Disp: , Rfl:   Physical exam: There were no vitals filed for this visit. Physical Exam Neurological:     Mental Status: She is oriented to person, place, and time.  Psychiatric:        Mood and Affect: Mood normal.        Behavior: Behavior normal.      CMP Latest Ref Rng & Units 07/14/2020  Glucose 70 - 99 mg/dL 204(H)  BUN 8 - 23 mg/dL 5(L)  Creatinine 0.44 - 1.00 mg/dL 0.98  Sodium 135 - 145 mmol/L 137  Potassium 3.5 - 5.1 mmol/L 3.7  Chloride 98 - 111 mmol/L 102  CO2 22 - 32 mmol/L 23  Calcium 8.9 - 10.3 mg/dL 6.9(L)  Total Protein 6.5 - 8.1 g/dL 4.9(L)  Total Bilirubin 0.3 - 1.2 mg/dL 0.7  Alkaline Phos 38 - 126 U/L 43  AST 15 - 41 U/L 49(H)  ALT 0 - 44 U/L 34   CBC Latest Ref Rng & Units 07/14/2020  WBC 4.0 - 10.5 K/uL 6.7  Hemoglobin 12.0 - 15.0 g/dL 8.5(L)  Hematocrit 36.0 - 46.0 % 26.4(L)  Platelets 150 - 400 K/uL 116(L)     No images are attached to the encounter.  DG Chest 2 View  Result Date: 07/27/2020 CLINICAL DATA:  Lung cancer EXAM: CHEST - 2 VIEW  COMPARISON:  07/15/2020 FINDINGS: Status post left lower lobectomy. Postsurgical changes at the left lung base. No other focal parenchymal opacity. No pleural effusion or pneumothorax. Stable cardiomediastinal silhouette. No acute osseous abnormality. IMPRESSION: No active cardiopulmonary disease. Postsurgical changes at the left lung base. Electronically Signed   By: Kathreen Devoid   On: 07/27/2020 14:32     Assessment and plan- Patient is a 60 y.o. female who presents to [facility] for initial meeting in preparation for starting chemotherapy for the treatment of    1. Pathologic stage IIb NSCLC of left lower lobe lung- plan for treatment with cisplatin and pemetrexed. NCCN Guidelines reviewed for today's visit. Rationale for treatment reviewed with patient who demonstrates understanding and consents to plan for chemotherapy.   2. Chemo Care Clinic/High Risk for ER/Hospitalization during chemotherapy- We discussed the role of the chemo care clinic and identified patient specific risk factors. I discussed that patient was identified as high risk primarily based on: medical comorbidities, previous hospitalizations and ER visits. She is followed by [facility], has a current PCP who manages her primary care.   3. Social Determinants of Health- we discussed that social determinants of health may have significant impacts on health and outcomes for cancer patients.  Today we discussed specific social determinants of performance status, alcohol use, depression, financial needs, food insecurity, housing, interpersonal violence, social connections, stress, tobacco use, and transportation.  After lengthy discussion she declines specific needs though identifies several areas of risk. We reviewed programs available at the [facility] and she will reach out if needed.    We also discussed the role of the Symptom Management Clinic at [facility] for acute issues and methods of contacting clinic/provider. She denies needing specific assistance at this time and She will be followed by [facility] RN (Nurse Navigator).   Return to clinic as scheduled  Visit Diagnosis 1. Primary adenocarcinoma of lower lobe of left lung ([place])      I discussed the assessment and treatment plan with the patient. The patient was provided an opportunity to ask questions and all were answered. The patient agreed with the plan and demonstrated an understanding of the instructions.   The patient was advised to call back or seek an in-person evaluation if the symptoms worsen or if the condition fails to improve as anticipated.   I spent 15 minutes on this telephone encounter.   Beckey Rutter, DNP, AGNP-C [place] at [facility] [phone] (clinic)

## 2020-08-20 ENCOUNTER — Other Ambulatory Visit: Payer: Self-pay

## 2020-08-20 ENCOUNTER — Ambulatory Visit
Admission: RE | Admit: 2020-08-20 | Discharge: 2020-08-20 | Disposition: A | Discharge status: Home / Self Care | Payer: Medicaid Other | Source: Ambulatory Visit | Attending: Oncology | Admitting: Oncology

## 2020-08-20 DIAGNOSIS — Z79899 Other long term (current) drug therapy: Secondary | ICD-10-CM | POA: Insufficient documentation

## 2020-08-20 DIAGNOSIS — Z87891 Personal history of nicotine dependence: Secondary | ICD-10-CM | POA: Diagnosis not present

## 2020-08-20 DIAGNOSIS — C3490 Malignant neoplasm of unspecified part of unspecified bronchus or lung: Secondary | ICD-10-CM | POA: Diagnosis not present

## 2020-08-20 DIAGNOSIS — C3432 Malignant neoplasm of lower lobe, left bronchus or lung: Secondary | ICD-10-CM

## 2020-08-20 DIAGNOSIS — Z794 Long term (current) use of insulin: Secondary | ICD-10-CM | POA: Insufficient documentation

## 2020-08-20 HISTORY — PX: IR IMAGING GUIDED PORT INSERTION: IMG5740

## 2020-08-20 LAB — GLUCOSE, CAPILLARY
Glucose-Capillary: 121 mg/dL — ABNORMAL HIGH (ref 70–99)
Glucose-Capillary: 126 mg/dL — ABNORMAL HIGH (ref 70–99)

## 2020-08-20 IMAGING — US IR IMAGING GUIDED PORT INSERTION
3 series · 4 of 4 positions shown · non-contrast
Comparison: PET-CT-[DATE]

INDICATION: History of metastatic lung cancer. In need of durable intravenous
access for chemotherapy administration.

EXAM:
IMPLANTED PORT A CATH PLACEMENT WITH ULTRASOUND AND FLUOROSCOPIC
GUIDANCE

[Series 1: subclavian 3 fps · 1 of 1 slices shown (1 of 2)]
[im 1/1]
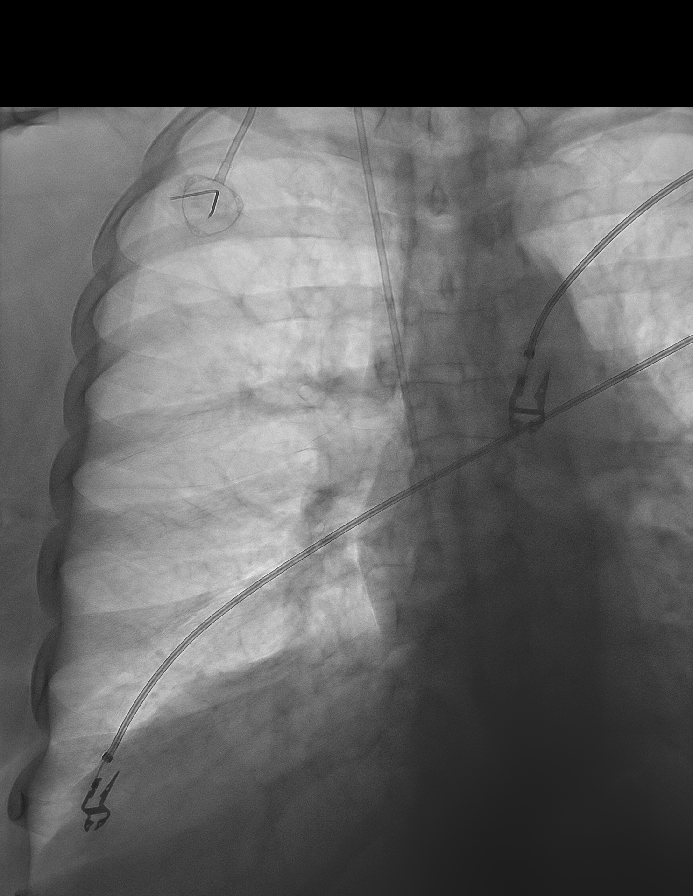

[Series 1: subclavian 3 fps · 1 of 1 slices shown (2 of 2)]
[im 1/1]
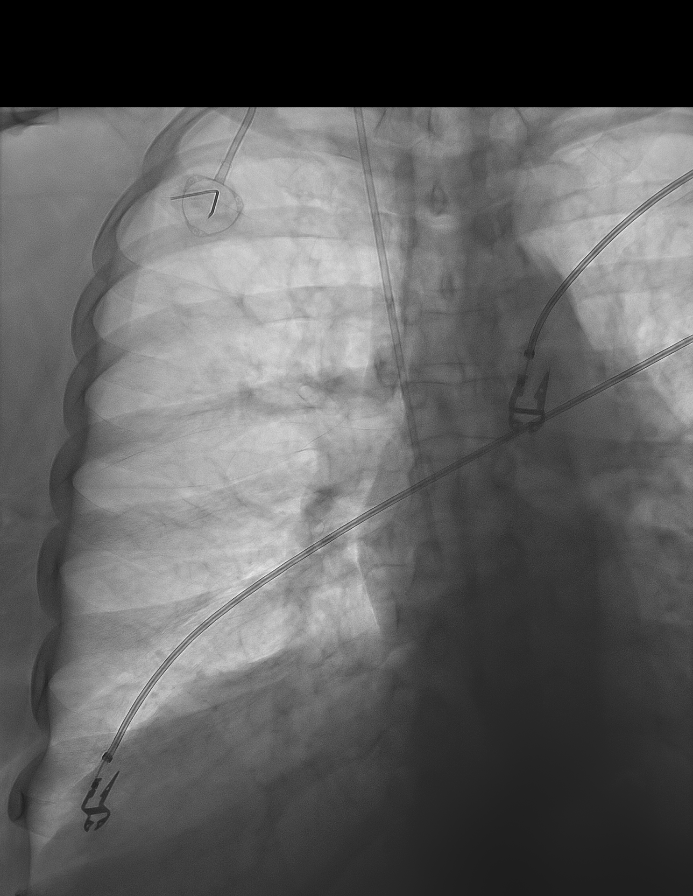

[Series 1: ir fluoro/shunt/fist · 2 of 2 slices shown]
[im 1/2]
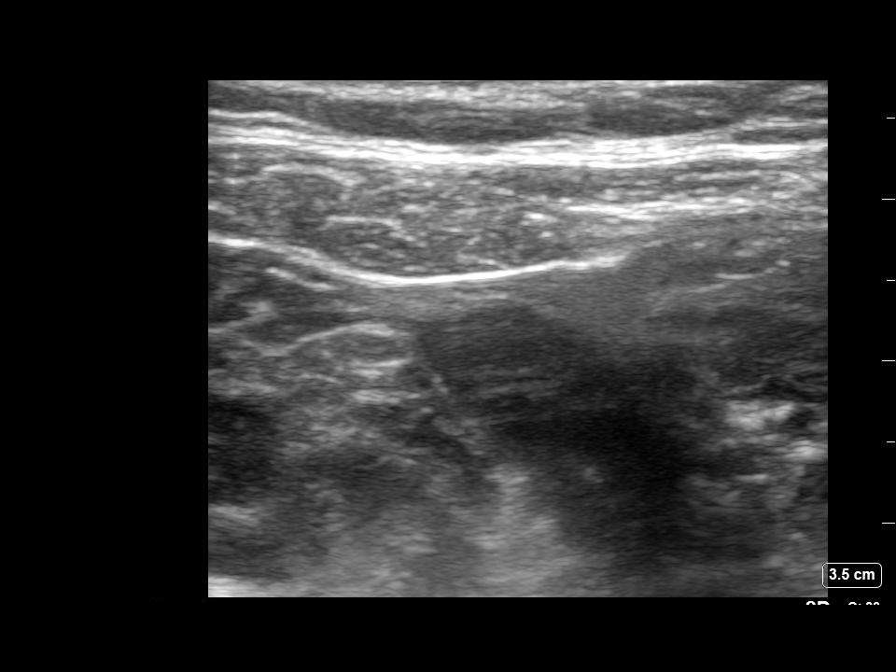
[im 2/2]
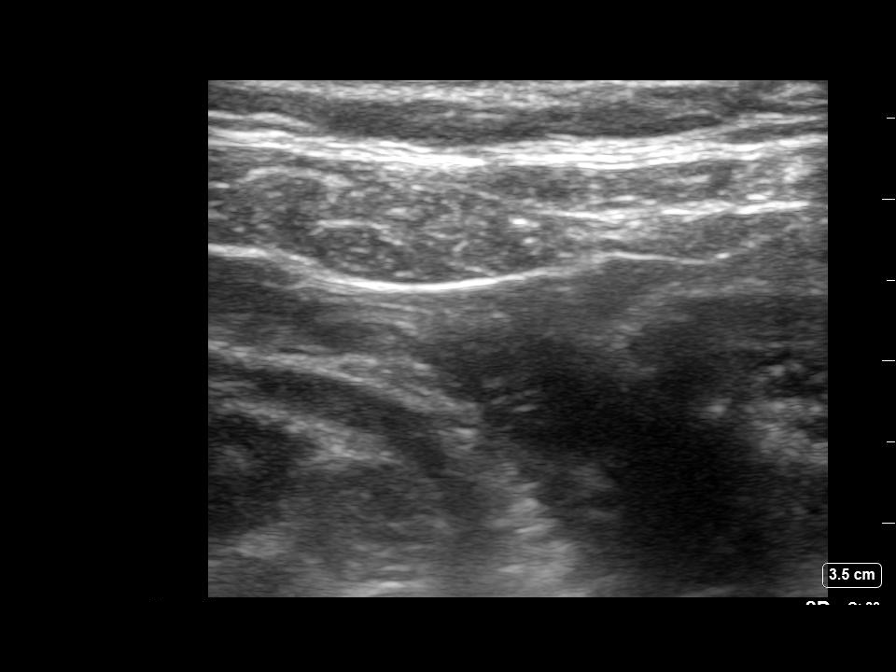

[4 of 4 positions shown; findings below may reference images not displayed]

MEDICATIONS:
None

ANESTHESIA/SEDATION:
Moderate (conscious) sedation was employed during this procedure. A
total of Versed 1 mg and Fentanyl 50 mcg was administered
intravenously.

Moderate Sedation Time: 26 minutes. The patient's level of
consciousness and vital signs were monitored continuously by
radiology nursing throughout the procedure under my direct
supervision.

CONTRAST:  None

FLUOROSCOPY TIME:  36 seconds (7 mGy)

COMPLICATIONS:
None immediate.

PROCEDURE:
The procedure, risks, benefits, and alternatives were explained to
the patient. Questions regarding the procedure were encouraged and
answered. The patient understands and consents to the procedure.

The right neck and chest were prepped with chlorhexidine in a
sterile fashion, and a sterile drape was applied covering the
operative field. Maximum barrier sterile technique with sterile
gowns and gloves were used for the procedure. A timeout was
performed prior to the initiation of the procedure. Local anesthesia
was provided with 1% lidocaine with epinephrine.

After creating a small venotomy incision, a micropuncture kit was
utilized to access the internal jugular vein. Real-time ultrasound
guidance was utilized for vascular access including the acquisition
of a permanent ultrasound image documenting patency of the accessed
vessel. The microwire was utilized to measure appropriate catheter
length.

A subcutaneous port pocket was then created along the upper chest
wall utilizing a combination of sharp and blunt dissection. The
pocket was irrigated with sterile saline. A single lumen "standard
sized" power injectable port was chosen for placement. The 8 Fr
catheter was tunneled from the port pocket site to the venotomy
incision. The port was placed in the pocket. The external catheter
was trimmed to appropriate length. At the venotomy, an 8 Fr
peel-away sheath was placed over a guidewire under fluoroscopic
guidance. The catheter was then placed through the sheath and the
sheath was removed. Final catheter positioning was confirmed and
documented with a fluoroscopic spot radiograph. The port was
accessed with TEMISTOCLE EDUARDO MENDOZA needle, aspirated and flushed with heparinized
saline.

The venotomy site was closed with an interrupted 4-0 Vicryl suture.
The port pocket incision was closed with interrupted 2-0 Vicryl
suture. The skin was opposed with a running subcuticular 4-0 Vicryl
suture. Dermabond and TEMISTOCLE EDUARDO MENDOZA were applied to both incisions.
Dressings were applied. The patient tolerated the procedure well
without immediate post procedural complication.
FINDINGS: After catheter placement, the tip lies within the superior
cavoatrial junction. The catheter aspirates and flushes normally and
is ready for immediate use.
IMPRESSION: Successful placement of a right internal jugular approach power
injectable Port-A-Cath. The catheter is ready for immediate use.

## 2020-08-20 MED ORDER — FENTANYL CITRATE (PF) 100 MCG/2ML IJ SOLN
INTRAMUSCULAR | Status: AC | PRN
  Administered 2020-08-20: 50 ug via INTRAVENOUS

## 2020-08-20 MED ORDER — HEPARIN SOD (PORK) LOCK FLUSH 100 UNIT/ML IV SOLN
INTRAVENOUS | Status: AC
  Filled 2020-08-20: qty 5

## 2020-08-20 MED ORDER — SODIUM CHLORIDE 0.9 % IV SOLN: INTRAVENOUS | Status: DC

## 2020-08-20 MED ORDER — MIDAZOLAM HCL 5 MG/5ML IJ SOLN
INTRAMUSCULAR | Status: AC | PRN
  Administered 2020-08-20: 1 mg via INTRAVENOUS

## 2020-08-20 MED ORDER — MIDAZOLAM HCL 5 MG/5ML IJ SOLN
INTRAMUSCULAR | Status: AC
  Filled 2020-08-20: qty 5

## 2020-08-20 MED ORDER — FENTANYL CITRATE (PF) 100 MCG/2ML IJ SOLN
INTRAMUSCULAR | Status: AC
  Filled 2020-08-20: qty 2

## 2020-08-20 NOTE — Discharge Instructions (Signed)
Implanted Port Home Guide An implanted port is a device that is placed under the skin. It is usually placed in the chest. The device can be used to give IV medicine, to take blood, or for dialysis. You may have an implanted port if:  You need IV medicine that would be irritating to the small veins in your hands or arms.  You need IV medicines, such as antibiotics, for a long period of time.  You need IV nutrition for a long period of time.  You need dialysis. When you have a port, your health care provider can choose to use the port instead of veins in your arms for these procedures. You may have fewer limitations when using a port than you would if you used other types of long-term IVs, and you will likely be able to return to normal activities after your incision heals. An implanted port has two main parts:  Reservoir. The reservoir is the part where a needle is inserted to give medicines or draw blood. The reservoir is round. After it is placed, it appears as a small, raised area under your skin.  Catheter. The catheter is a thin, flexible tube that connects the reservoir to a vein. Medicine that is inserted into the reservoir goes into the catheter and then into the vein. How is my port accessed? To access your port:  A numbing cream may be placed on the skin over the port site.  Your health care provider will put on a mask and sterile gloves.  The skin over your port will be cleaned carefully with a germ-killing soap and allowed to dry.  Your health care provider will gently pinch the port and insert a needle into it.  Your health care provider will check for a blood return to make sure the port is in the vein and is not clogged.  If your port needs to remain accessed to get medicine continuously (constant infusion), your health care provider will place a clear bandage (dressing) over the needle site. The dressing and needle will need to be changed every week, or as told by your  health care provider. What is flushing? Flushing helps keep the port from getting clogged. Follow instructions from your health care provider about how and when to flush the port. Ports are usually flushed with saline solution or a medicine called heparin. The need for flushing will depend on how the port is used:  If the port is only used from time to time to give medicines or draw blood, the port may need to be flushed: ? Before and after medicines have been given. ? Before and after blood has been drawn. ? As part of routine maintenance. Flushing may be recommended every 4-6 weeks.  If a constant infusion is running, the port may not need to be flushed.  Throw away any syringes in a disposal container that is meant for sharp items (sharps container). You can buy a sharps container from a pharmacy, or you can make one by using an empty hard plastic bottle with a cover. How long will my port stay implanted? The port can stay in for as long as your health care provider thinks it is needed. When it is time for the port to come out, a surgery will be done to remove it. The surgery will be similar to the procedure that was done to put the port in. Follow these instructions at home:  Flush your port as told by your health care   provider.  If you need an infusion over several days, follow instructions from your health care provider about how to take care of your port site. Make sure you: ? Wash your hands with soap and water before you change your dressing. If soap and water are not available, use alcohol-based hand sanitizer. ? Change your dressing as told by your health care provider. ? Place any used dressings or infusion bags into a plastic bag. Throw that bag in the trash. ? Keep the dressing that covers the needle clean and dry. Do not get it wet. ? Do not use scissors or sharp objects near the tube. ? Keep the tube clamped, unless it is being used.  Check your port site every day for signs  of infection. Check for: ? Redness, swelling, or pain. ? Fluid or blood. ? Pus or a bad smell.  Protect the skin around the port site. ? Avoid wearing bra straps that rub or irritate the site. ? Protect the skin around your port from seat belts. Place a soft pad over your chest if needed.  Bathe or shower as told by your health care provider. The site may get wet as long as you are not actively receiving an infusion.  Return to your normal activities as told by your health care provider. Ask your health care provider what activities are safe for you.  Carry a medical alert card or wear a medical alert bracelet at all times. This will let health care providers know that you have an implanted port in case of an emergency.   Get help right away if:  You have redness, swelling, or pain at the port site.  You have fluid or blood coming from your port site.  You have pus or a bad smell coming from the port site.  You have a fever. Summary  Implanted ports are usually placed in the chest for long-term IV access.  Follow instructions from your health care provider about flushing the port and changing bandages (dressings).  Take care of the area around your port by avoiding clothing that puts pressure on the area, and by watching for signs of infection.  Protect the skin around your port from seat belts. Place a soft pad over your chest if needed.  Get help right away if you have a fever or you have redness, swelling, pain, drainage, or a bad smell at the port site. This information is not intended to replace advice given to you by your health care provider. Make sure you discuss any questions you have with your health care provider. Document Revised: 10/20/2019 Document Reviewed: 10/20/2019 [employer] Patient Education  2021 [employer] Moderate Conscious Sedation, Adult, Care After This sheet gives you information about how to care for yourself after your procedure. Your health care  provider may also give you more specific instructions. If you have problems or questions, contact your health care provider. What can I expect after the procedure? After the procedure, it is common to have:  Sleepiness for several hours.  Impaired judgment for several hours.  Difficulty with balance.  Vomiting if you eat too soon. Follow these instructions at home: For the time period you were told by your health care provider:  Rest.  Do not participate in activities where you could fall or become injured.  Do not drive or use machinery.  Do not drink alcohol.  Do not take sleeping pills or medicines that cause drowsiness.  Do not make important decisions or sign   legal documents.  Do not take care of children on your own.      Eating and drinking  Follow the diet recommended by your health care provider.  Drink enough fluid to keep your urine pale yellow.  If you vomit: ? Drink water, juice, or soup when you can drink without vomiting. ? Make sure you have little or no nausea before eating solid foods.   General instructions  Take over-the-counter and prescription medicines only as told by your health care provider.  Have a responsible adult stay with you for the time you are told. It is important to have someone help care for you until you are awake and alert.  Do not smoke.  Keep all follow-up visits as told by your health care provider. This is important. Contact a health care provider if:  You are still sleepy or having trouble with balance after 24 hours.  You feel light-headed.  You keep feeling nauseous or you keep vomiting.  You develop a rash.  You have a fever.  You have redness or swelling around the IV site. Get help right away if:  You have trouble breathing.  You have new-onset confusion at home. Summary  After the procedure, it is common to feel sleepy, have impaired judgment, or feel nauseous if you eat too soon.  Rest after you  get home. Know the things you should not do after the procedure.  Follow the diet recommended by your health care provider and drink enough fluid to keep your urine pale yellow.  Get help right away if you have trouble breathing or new-onset confusion at home. This information is not intended to replace advice given to you by your health care provider. Make sure you discuss any questions you have with your health care provider. Document Revised: 10/03/2019 Document Reviewed: 05/01/2019 Elsevier Patient Education  2021 [employer]  

## 2020-08-20 NOTE — Sedation Documentation (Signed)
Timeout time [phone]

## 2020-08-20 NOTE — Procedures (Signed)
Pre Procedure Dx: Lung cancer Post Procedural Dx: Same  Successful placement of right IJ approach port-a-cath with tip at the superior caval atrial junction. The catheter is ready for immediate use.  Estimated Blood Loss: Minimal  Complications: None immediate.  Jay Codie Hainer, MD Pager #: [phone]   

## 2020-08-21 NOTE — Progress Notes (Signed)
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   INTERVAL HISTORY: Patient returns to clinic today for further evaluation and consideration of cycle 1 of 4 of cisplatin and pemetrexed.  She has tenderness at the site of her port placement, but otherwise feels well.  She has no neurologic complaints.  She denies any recent fevers or illnesses.  She has a good appetite and denies weight loss.  She has no chest pain, shortness of breath, cough, or hemoptysis.  She denies any nausea, vomiting, constipation, or diarrhea.  She has no urinary complaints.  Patient offers no further specific complaints today.  REVIEW OF SYSTEMS:   Review of Systems  Constitutional: Negative.  Negative for fever, malaise/fatigue and weight loss.  Respiratory: Negative.  Negative for cough, hemoptysis and shortness of breath.   Cardiovascular: Negative.  Negative for chest pain and leg swelling.  Gastrointestinal: Negative.  Negative for abdominal pain.  Genitourinary: Negative.  Negative for dysuria and flank pain.  Musculoskeletal: Negative.  Negative for back pain and joint pain.  Skin: Negative.  Negative for rash.  Neurological: Negative.  Negative for dizziness, focal weakness, weakness and headaches.  Psychiatric/Behavioral: Negative.  The patient is not nervous/anxious.     As per HPI. Otherwise, a complete review of systems is negative.  PAST MEDICAL HISTORY: Past Medical History:  Diagnosis Date   Asthma    COPD (chronic obstructive pulmonary disease) ([place])    CVA (cerebral vascular accident) ([name]) 2014   Depression    Diabetes mellitus ([facility]atalie [name])    Diabetes mellitus without complication  (Natalie [name])    Hepatitis C    History of kidney stones    Hyperlipidemia    Iron deficiency anemia    Obesity    OSA on CPAP    Mild, noncompliant with CPAP said cpap machine was recalled, is waiting for a replacement    Paranoia ([name])    Schizoaffective disorder, bipolar type ([facility]ie [name])     PAST SURGICAL HISTORY: Past Surgical History:  Procedure Laterality Date   BREAST CYST EXCISION Bilateral 2013   Benign   CESAREAN SECTION     CHOLECYSTECTOMY     COLONOSCOPY     ESOPHAGOGASTRODUODENOSCOPY  2020   done in [name] Left 2002   INTERCOSTAL NERVE BLOCK N/A 07/12/2020   Procedure: INTERCOSTAL NERVE BLOCK;  Surgeon: Melrose Nakayama, MD;  Location: [place];  Service: Thoracic;  Laterality: N/A;   IR IMAGING GUIDED PORT INSERTION  08/20/2020   LYMPH NODE DISSECTION Left 07/12/2020   Procedure: LYMPH NODE DISSECTION;  Surgeon: Melrose Nakayama, MD;  Location: [place];  Service: Thoracic;  Laterality: Left;   UMBILICAL HERNIA REPAIR     VIDEO BRONCHOSCOPY WITH ENDOBRONCHIAL NAVIGATION N/A 06/07/2020   Procedure: VIDEO BRONCHOSCOPY WITH ENDOBRONCHIAL NAVIGATION;  Surgeon: Tyler Pita, MD;  Location: [name];  Service: Pulmonary;  Laterality: N/A;    FAMILY HISTORY: History reviewed. No pertinent family history.  ADVANCED DIRECTIVES (Y/N):  N  HEALTH MAINTENANCE: Social History   Tobacco Use   Smoking status: Former Smoker    Packs/day: 2.00    Years: 38.00    Pack years: 76.00    Types: Cigarettes    Quit date:  2012    Years since quitting: 10.1   Smokeless tobacco: Former Counsellor Use: Never used  Substance Use Topics   Alcohol use: Not Currently    Comment: occassionaly   Drug use: Not Currently     Colonoscopy:  PAP:  Bone density:  Lipid panel:  No Known Allergies  Current Outpatient Medications  Medication Sig Dispense Refill   acetaminophen (TYLENOL) 650 MG CR tablet Take 650 mg by mouth 2  (two) times daily.     albuterol (VENTOLIN HFA) 108 (90 Base) MCG/ACT inhaler Inhale 2 puffs into the lungs every 6 (six) hours as needed for wheezing or shortness of breath.     atorvastatin (LIPITOR) 40 MG tablet Take 40 mg by mouth at bedtime.     benztropine (COGENTIN) 1 MG tablet Take 1 mg by mouth at bedtime.     citalopram (CELEXA) 20 MG tablet Take 20 mg by mouth every morning.     Dulaglutide (TRULICITY) 1.5 GD/9.2EQ SOPN Inject 1.5 mg into the skin every Friday.     Fluticasone-Umeclidin-Vilant (TRELEGY ELLIPTA) 100-62.5-25 MCG/INH AEPB Inhale 1 puff into the lungs daily.     folic acid (FOLVITE) 1 MG tablet Take 1 tablet (1 mg total) by mouth daily. Start 5-7 days before Alimta chemotherapy. Continue until 21 days after Alimta completed. 100 tablet 3   gabapentin (NEURONTIN) 300 MG capsule Take 300 mg by mouth 2 (two) times daily.     insulin detemir (LEVEMIR) 100 UNIT/ML injection Inject 15 Units into the skin 2 (two) times daily.     latanoprost (XALATAN) 0.005 % ophthalmic solution Place 1 drop into both eyes daily.     lidocaine (LIDODERM) 5 % Place 2 patches onto the skin See admin instructions. Apply 1 patch to each foot every 12 hours     lidocaine-prilocaine (EMLA) cream Apply to affected area once 30 g 3   LORazepam (ATIVAN) 1 MG tablet Take 1 mg by mouth 2 (two) times daily.     LYRICA 75 MG capsule Take 1 capsule by mouth twice a day  for nerve pain     ondansetron (ZOFRAN) 8 MG tablet Take 1 tablet (8 mg total) by mouth 2 (two) times daily as needed (Nausea or vomiting). Start if needed on the third day after cisplatin. 60 tablet 1   prochlorperazine (COMPAZINE) 10 MG tablet Take 1 tablet (10 mg total) by mouth every 6 (six) hours as needed (Nausea or vomiting). 60 tablet 1   ziprasidone (GEODON) 80 MG capsule Take 80 mg by mouth at bedtime.     diazepam (VALIUM) 10 MG tablet Take 1 tablet by mouth prior to MRI scan. (Patient not taking: No sig reported) 2  tablet 0   insulin aspart (NOVOLOG) 100 UNIT/ML FlexPen Inject 9 Units into the skin 3 (three) times daily with meals. (Patient not taking: No sig reported) 15 mL 11   oxyCODONE (OXY IR/ROXICODONE) 5 MG immediate release tablet Take 1 tablet (5 mg total) by mouth every 4 (four) hours as needed for moderate pain. (Patient not taking: No sig reported) 30 tablet 0   traMADol (ULTRAM) 50 MG tablet Take 50 mg by mouth every 6 (six) hours as needed.     No current facility-administered medications for this visit.    OBJECTIVE: Vitals:   08/26/20 0915  BP: (!) 110/57  Pulse: 82  Temp: 97.8 F (36.6 C)  SpO2: 95%     Body mass index is 42.76  kg/m.    ECOG FS:0 - Asymptomatic  General: Well-developed, well-nourished, no acute distress. Eyes: Pink conjunctiva, anicteric sclera. HEENT: Normocephalic, moist mucous membranes. Lungs: No audible wheezing or coughing. Heart: Regular rate and rhythm. Abdomen: Soft, nontender, no obvious distention. Musculoskeletal: No edema, cyanosis, or clubbing. Neuro: Alert, answering all questions appropriately. Cranial nerves grossly intact. Skin: No rashes or petechiae noted. Psych: Normal affect.  LAB RESULTS:  Lab Results  Component Value Date   NA 138 08/26/2020   K 3.5 08/26/2020   CL 104 08/26/2020   CO2 25 08/26/2020   GLUCOSE 215 (H) 08/26/2020   BUN 13 08/26/2020   CREATININE 0.80 08/26/2020   CALCIUM 9.3 08/26/2020   PROT 7.1 08/26/2020   ALBUMIN 3.7 08/26/2020   AST 23 08/26/2020   ALT 24 08/26/2020   ALKPHOS 57 08/26/2020   BILITOT 0.8 08/26/2020   GFRNONAA >60 08/26/2020   GFRAA >60 01/30/2020    Lab Results  Component Value Date   WBC 8.3 08/26/2020   NEUTROABS 5.8 08/26/2020   HGB 10.1 (L) 08/26/2020   HCT 31.6 (L) 08/26/2020   MCV 91.1 08/26/2020   PLT 249 08/26/2020     STUDIES: MR Brain W Wo Contrast  Result Date: 08/26/2020 CLINICAL DATA:  Staging of newly diagnosed lung carcinoma. EXAM: MRI HEAD WITHOUT AND  WITH CONTRAST TECHNIQUE: Multiplanar, multiecho pulse sequences of the brain and surrounding structures were obtained without and with intravenous contrast. CONTRAST:  43m GADAVIST GADOBUTROL 1 MMOL/ML IV SOLN COMPARISON:  None. FINDINGS: Brain: No acute infarct, mass effect or extra-axial collection. No acute or chronic hemorrhage. There is multifocal hyperintense T2-weighted signal within the white matter. Parenchymal volume and CSF spaces are normal. The midline structures are normal. There is no abnormal contrast enhancement. Vascular: Major flow voids are preserved. Skull and upper cervical spine: Normal calvarium and skull base. Visualized upper cervical spine and soft tissues are normal. Sinuses/Orbits:No paranasal sinus fluid levels or advanced mucosal thickening. No mastoid or middle ear effusion. Normal orbits. IMPRESSION: 1. No evidence of intracranial metastatic disease. 2. Multifocal hyperintense T2-weighted signal within the white matter, most consistent with chronic microvascular ischemia. Electronically Signed   By: KUlyses JarredM.D.   On: 08/26/2020 00:51   IR IMAGING GUIDED PORT INSERTION  Result Date: 08/20/2020 INDICATION: History of metastatic lung cancer. In need of durable intravenous access for chemotherapy administration. EXAM: IMPLANTED PORT A CATH PLACEMENT WITH ULTRASOUND AND FLUOROSCOPIC GUIDANCE COMPARISON:  PET-CT-05/06/2020 MEDICATIONS: None ANESTHESIA/SEDATION: Moderate (conscious) sedation was employed during this procedure. A total of Versed 1 mg and Fentanyl 50 mcg was administered intravenously. Moderate Sedation Time: 26 minutes. The patient's level of consciousness and vital signs were monitored continuously by radiology nursing throughout the procedure under my direct supervision. CONTRAST:  None FLUOROSCOPY TIME:  36 seconds (7 mGy) COMPLICATIONS: None immediate. PROCEDURE: The procedure, risks, benefits, and alternatives were explained to the patient. Questions  regarding the procedure were encouraged and answered. The patient understands and consents to the procedure. The right neck and chest were prepped with chlorhexidine in a sterile fashion, and a sterile drape was applied covering the operative field. Maximum barrier sterile technique with sterile gowns and gloves were used for the procedure. A timeout was performed prior to the initiation of the procedure. Local anesthesia was provided with 1% lidocaine with epinephrine. After creating a small venotomy incision, a micropuncture kit was utilized to access the internal jugular vein. Real-time ultrasound guidance was utilized for vascular access including the acquisition of  a permanent ultrasound image documenting patency of the accessed vessel. The microwire was utilized to measure appropriate catheter length. A subcutaneous port pocket was then created along the upper chest wall utilizing a combination of sharp and blunt dissection. The pocket was irrigated with sterile saline. A single lumen "standard sized" power injectable port was chosen for placement. The 8 Fr catheter was tunneled from the port pocket site to the venotomy incision. The port was placed in the pocket. The external catheter was trimmed to appropriate length. At the venotomy, an 8 Fr peel-away sheath was placed over a guidewire under fluoroscopic guidance. The catheter was then placed through the sheath and the sheath was removed. Final catheter positioning was confirmed and documented with a fluoroscopic spot radiograph. The port was accessed with a Huber needle, aspirated and flushed with heparinized saline. The venotomy site was closed with an interrupted 4-0 Vicryl suture. The port pocket incision was closed with interrupted 2-0 Vicryl suture. The skin was opposed with a running subcuticular 4-0 Vicryl suture. Dermabond and Steri-strips were applied to both incisions. Dressings were applied. The patient tolerated the procedure well without  immediate post procedural complication. FINDINGS: After catheter placement, the tip lies within the superior cavoatrial junction. The catheter aspirates and flushes normally and is ready for immediate use. IMPRESSION: Successful placement of a right internal jugular approach power injectable Port-A-Cath. The catheter is ready for immediate use. Electronically Signed   By: Sandi Mariscal M.D.   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  PLAN:    1. 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  Patient also noted to have a right iliac bony hypermetabolism as well as a soft tissue mass adjacent suspicious for isolated metastasis.  Soft tissue mass was biopsied on May 19, 2020 which was negative for malignancy.  This lesion is still suspicious, so therefore we will continue to monitor closely. Patient underwent resection on July 12, 2020 which reviewed for positive lymph nodes increasing her stage IIb.  MRI of the brain on August 25, 2020 for metastatic disease.  Plan to give adjuvant chemotherapy using cisplatin and pemetrexed every 3 weeks for 4 cycles.  Proceed with cycle 1 today.  Return to clinic in 1 week for laboratory work and further evaluation and then in 3 weeks from consideration of cycle 2.  2.  Anemia: Mild, monitor.  Patient's hemoglobin is 10.1 today.  I spent a total of 30 minutes reviewing chart data, face-to-face evaluation with the patient, counseling and coordination of care as detailed above.   Patient expressed understanding and was in agreement with this plan. She also understands that She can call clinic at any time with any questions,  concerns, or complaints.   Cancer Staging Primary adenocarcinoma of lower lobe of left lung [facility]) Staging form: Lung, AJCC 8th Edition - Pathologic stage from 07/27/2020: Stage IIB (pT2b, pN1, cM0) - Signed by Lloyd Huger, MD on 07/27/2020   Lloyd Huger, MD   08/27/2020 6:44 AM

## 2020-08-25 ENCOUNTER — Ambulatory Visit
Admission: RE | Admit: 2020-08-25 | Discharge: 2020-08-25 | Disposition: A | Discharge status: Home / Self Care | Payer: Medicaid Other | Source: Ambulatory Visit | Attending: Oncology | Admitting: Oncology

## 2020-08-25 ENCOUNTER — Other Ambulatory Visit: Payer: Self-pay

## 2020-08-25 DIAGNOSIS — C3432 Malignant neoplasm of lower lobe, left bronchus or lung: Secondary | ICD-10-CM | POA: Insufficient documentation

## 2020-08-25 IMAGING — MR MR HEAD WO/W CM
14 series · 48 of 48 positions shown · IV contrast (9ml Gadavist)
Comparison: None.

CLINICAL DATA: Staging of newly diagnosed lung carcinoma.

EXAM:
MRI HEAD WITHOUT AND WITH CONTRAST
TECHNIQUE: Multiplanar, multiecho pulse sequences of the brain and surrounding
structures were obtained without and with intravenous contrast.
CONTRAST:  10mL GADAVIST GADOBUTROL 1 MMOL/ML IV SOLN

[Series 5: ax dwi_tracew · axial · 3.0mm · 0.65mm/px · z∈[-131,+23]mm · 4 of 48 slices shown]
[im 1/48]
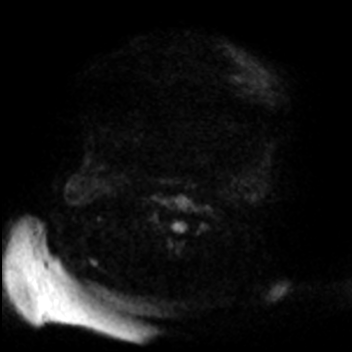
[im 16/48]
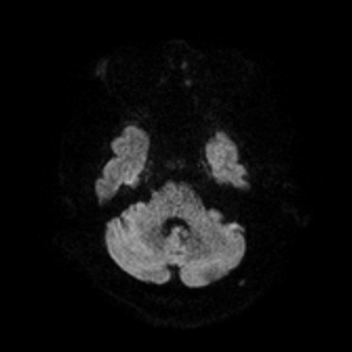
[im 32/48]
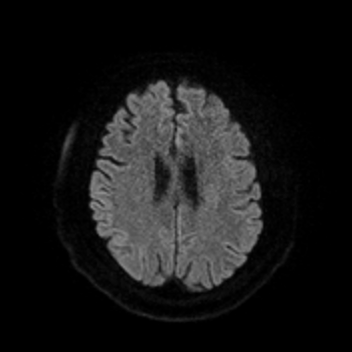
[im 48/48]
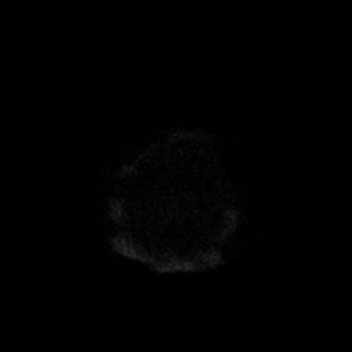

[Series 6: ax dwi_adc · axial · 3.0mm · 0.65mm/px · z∈[-131,+23]mm · 4 of 48 slices shown]
[im 1/48]
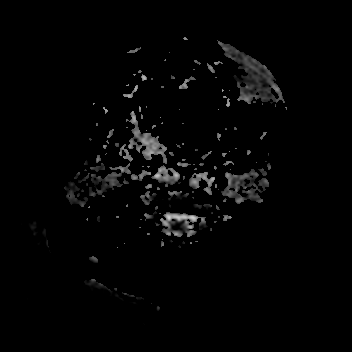
[im 16/48]
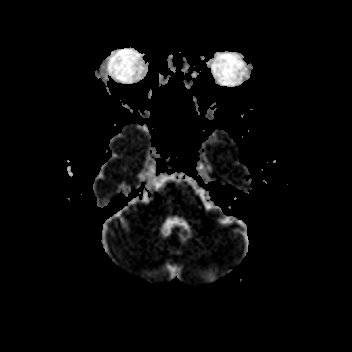
[im 32/48]
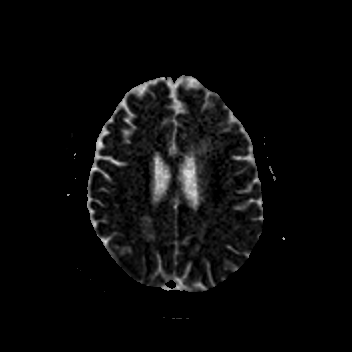
[im 48/48]
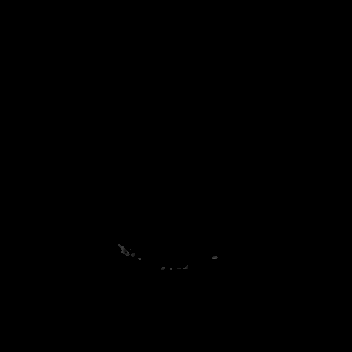

[Series 7: cor dwi_tracew · coronal · 5.0mm · 0.65mm/px · 2 of 40 slices shown]
[im 1/40]
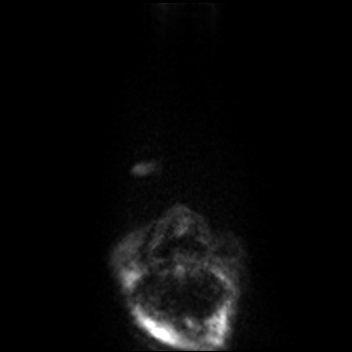
[im 40/40]
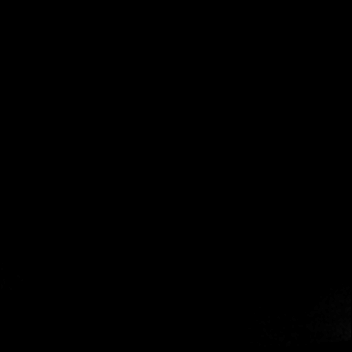

[Series 8: cor dwi_adc · coronal · 5.0mm · 0.65mm/px · 2 of 35 slices shown]
[im 1/35]
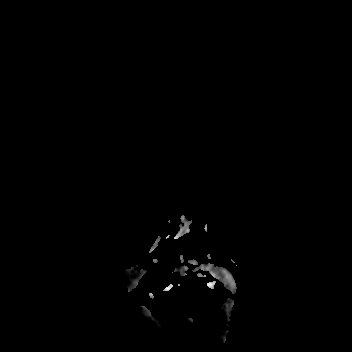
[im 35/35]
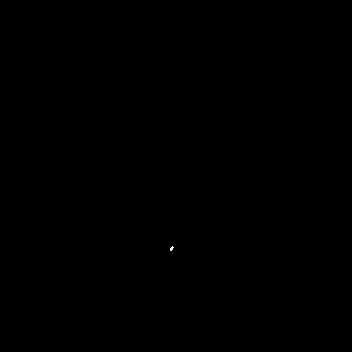

[Series 9: T1 · sagittal · 5.0mm · 0.62mm/px · 1 of 25 slices shown (1 of 2)]
[im 1/25]
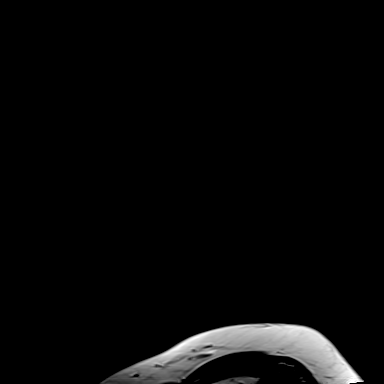

[Series 10: T2 · axial · 5.0mm · 0.53mm/px · 1 of 25 slices shown]
[im 1/25]
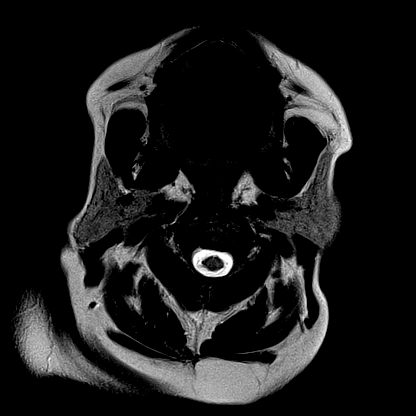

[Series 12: pha_images · axial · 3.0mm · 0.90mm/px · z∈[-138,+35]mm · 3 of 59 slices shown]
[im 1/59]
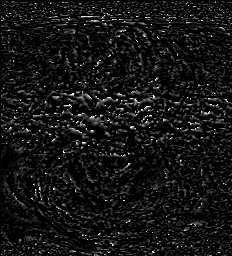
[im 30/59]
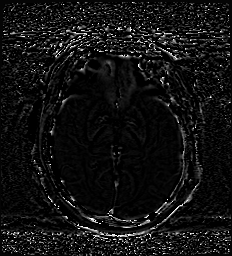
[im 59/59]
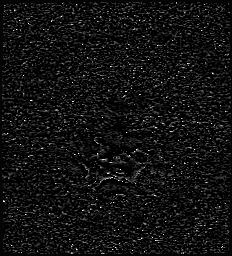

[Series 13: swi_images · axial · 3.0mm · 0.90mm/px · z∈[-141,+35]mm · 3 of 60 slices shown]
[im 1/60]
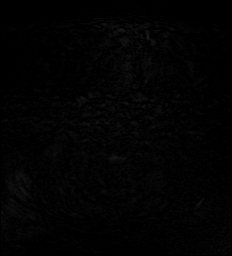
[im 30/60]
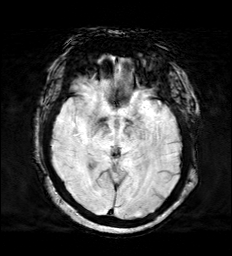
[im 60/60]
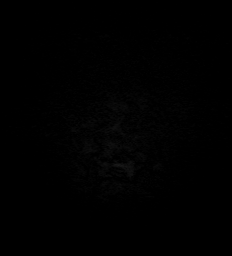

[Series 15: FLAIR · axial · 3.0mm · 0.53mm/px · z∈[-134,+27]mm · 3 of 55 slices shown]
[im 1/55]
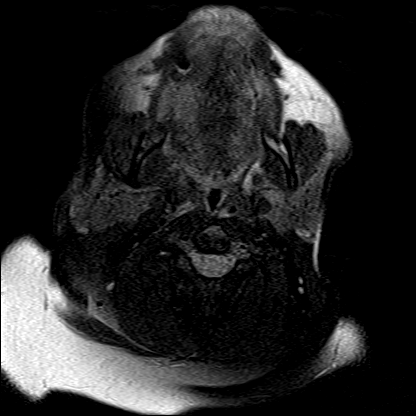
[im 28/55]
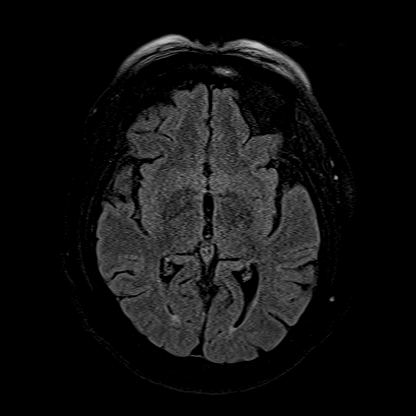
[im 55/55]
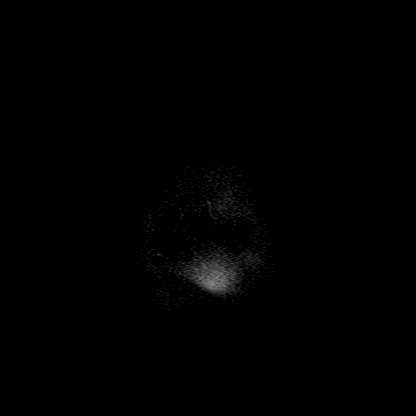

[Series 16: T1 · axial · 1.0mm · 0.98mm/px · z∈[-140,+34]mm · 10 of 176 slices shown (2 of 2)]
[im 1/176]
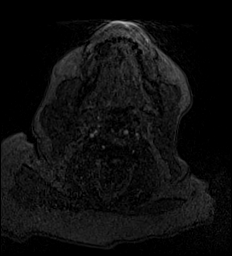
[im 20/176]
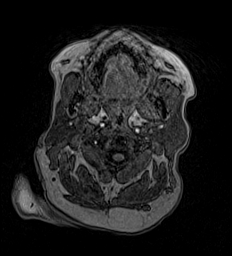
[im 39/176]
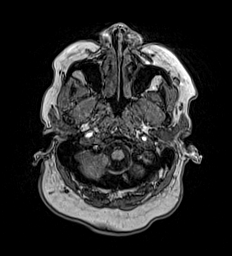
[im 59/176]
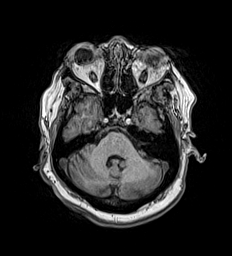
[im 78/176]
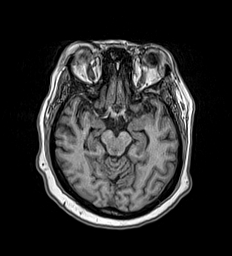
[im 98/176]
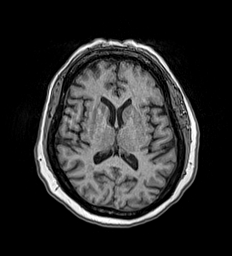
[im 117/176]
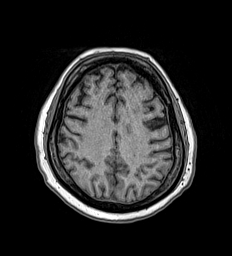
[im 137/176]
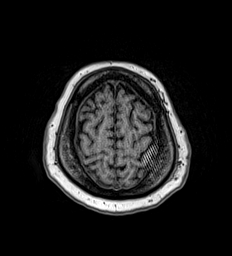
[im 156/176]
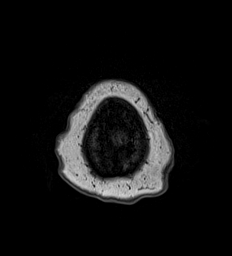
[im 176/176]
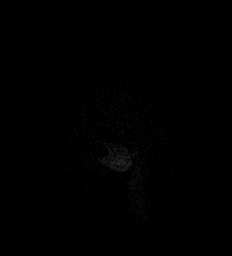

[Series 17: T2 post-contrast · coronal · 5.0mm · 0.57mm/px · 2 of 29 slices shown]
[im 1/29]
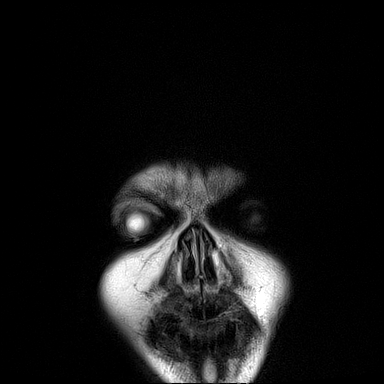
[im 29/29]
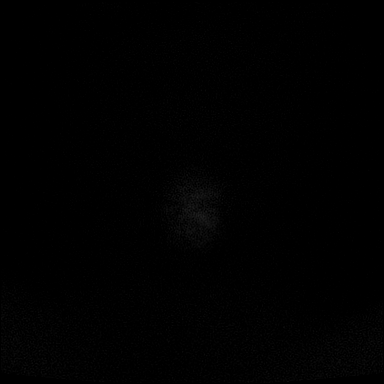

[Series 18: T1 post-contrast · axial · 1.0mm · 0.98mm/px · z∈[-140,+34]mm · 10 of 176 slices shown (1 of 3)]
[im 1/176]
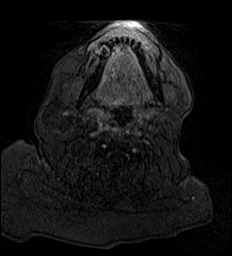
[im 20/176]
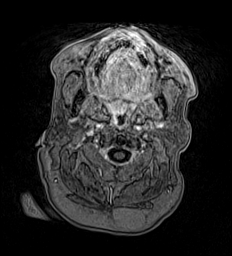
[im 39/176]
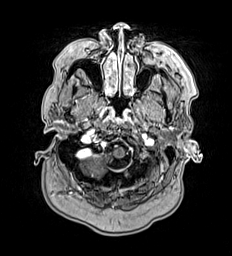
[im 59/176]
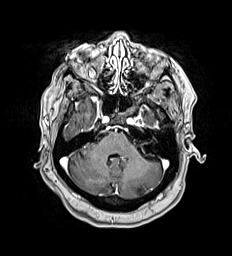
[im 78/176]
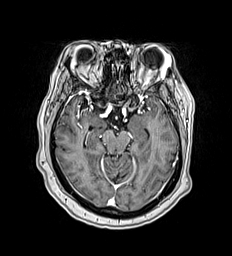
[im 98/176]
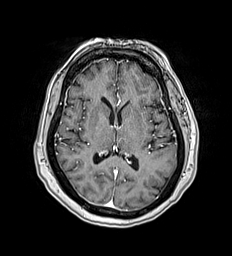
[im 117/176]
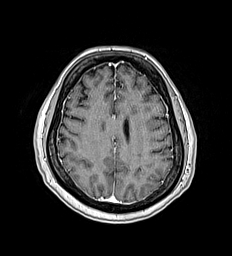
[im 137/176]
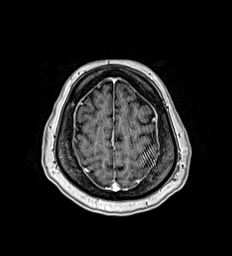
[im 156/176]
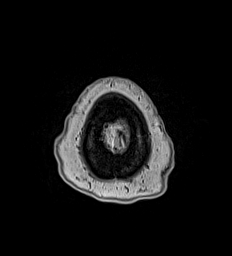
[im 176/176]
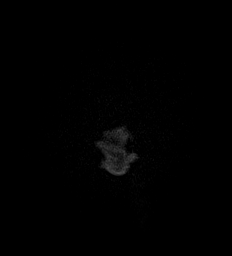

[Series 19: T1 post-contrast · coronal · 5.0mm · 0.57mm/px · 2 of 29 slices shown (2 of 3)]
[im 1/29]
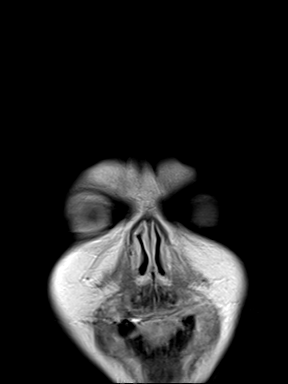
[im 29/29]
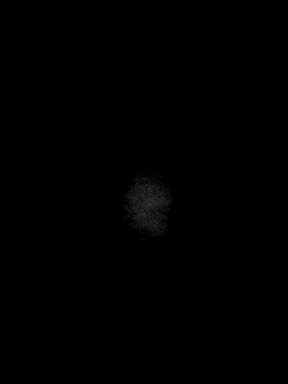

[Series 20: T1 post-contrast · sagittal · 5.0mm · 0.62mm/px · 1 of 25 slices shown (3 of 3)]
[im 1/25]
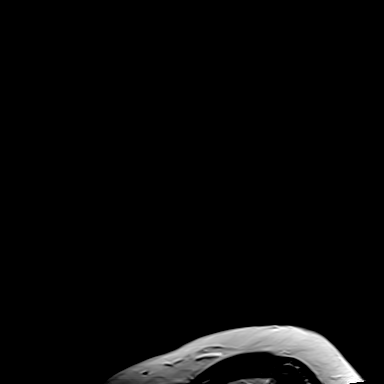

[48 of 48 positions shown; findings below may reference images not displayed]

FINDINGS: Brain: No acute infarct, mass effect or extra-axial collection. No
acute or chronic hemorrhage. There is multifocal hyperintense
T2-weighted signal within the white matter. Parenchymal volume and
CSF spaces are normal. The midline structures are normal. There is
no abnormal contrast enhancement.

Vascular: Major flow voids are preserved.

Skull and upper cervical spine: Normal calvarium and skull base.
Visualized upper cervical spine and soft tissues are normal.

Sinuses/Orbits:No paranasal sinus fluid levels or advanced mucosal
thickening. No mastoid or middle ear effusion. Normal orbits.
IMPRESSION: 1. No evidence of intracranial metastatic disease.
2. Multifocal hyperintense T2-weighted signal within the white
matter, most consistent with chronic microvascular ischemia.

## 2020-08-25 MED ORDER — GADOBUTROL 1 MMOL/ML IV SOLN
10.0000 mL | Freq: Once | INTRAVENOUS | Status: AC | PRN
  Administered 2020-08-25: 10 mL via INTRAVENOUS

## 2020-08-26 ENCOUNTER — Encounter: Payer: Self-pay | Admitting: *Deleted

## 2020-08-26 ENCOUNTER — Encounter (HOSPITAL_COMMUNITY): Payer: Self-pay

## 2020-08-26 ENCOUNTER — Inpatient Hospital Stay: Payer: Medicaid Other

## 2020-08-26 ENCOUNTER — Encounter (HOSPITAL_COMMUNITY): Payer: Self-pay | Admitting: Oncology

## 2020-08-26 ENCOUNTER — Inpatient Hospital Stay (HOSPITAL_BASED_OUTPATIENT_CLINIC_OR_DEPARTMENT_OTHER): Payer: Medicaid Other | Admitting: Oncology

## 2020-08-26 ENCOUNTER — Encounter: Payer: Self-pay | Admitting: Oncology

## 2020-08-26 VITALS — BP 110/57 | HR 82 | Temp 97.8°F | Wt 233.8 lb

## 2020-08-26 DIAGNOSIS — D72819 Decreased white blood cell count, unspecified: Secondary | ICD-10-CM | POA: Diagnosis not present

## 2020-08-26 DIAGNOSIS — Z5111 Encounter for antineoplastic chemotherapy: Secondary | ICD-10-CM | POA: Diagnosis present

## 2020-08-26 DIAGNOSIS — Z87891 Personal history of nicotine dependence: Secondary | ICD-10-CM | POA: Diagnosis not present

## 2020-08-26 DIAGNOSIS — Z79899 Other long term (current) drug therapy: Secondary | ICD-10-CM | POA: Diagnosis not present

## 2020-08-26 DIAGNOSIS — D649 Anemia, unspecified: Secondary | ICD-10-CM | POA: Diagnosis not present

## 2020-08-26 DIAGNOSIS — C3432 Malignant neoplasm of lower lobe, left bronchus or lung: Secondary | ICD-10-CM

## 2020-08-26 LAB — COMPREHENSIVE METABOLIC PANEL
ALT: 24 U/L (ref 0–44)
AST: 23 U/L (ref 15–41)
Albumin: 3.7 g/dL (ref 3.5–5.0)
Alkaline Phosphatase: 57 U/L (ref 38–126)
Anion gap: 9 (ref 5–15)
BUN: 13 mg/dL (ref 8–23)
CO2: 25 mmol/L (ref 22–32)
Calcium: 9.3 mg/dL (ref 8.9–10.3)
Chloride: 104 mmol/L (ref 98–111)
Creatinine, Ser: 0.8 mg/dL (ref 0.44–1.00)
GFR, Estimated: >60 mL/min (ref >60)
Glucose, Bld: 215 mg/dL — ABNORMAL HIGH (ref 70–99)
Potassium: 3.5 mmol/L (ref 3.5–5.1)
Sodium: 138 mmol/L (ref 135–145)
Total Bilirubin: 0.8 mg/dL (ref 0.3–1.2)
Total Protein: 7.1 g/dL (ref 6.5–8.1)

## 2020-08-26 LAB — CBC WITH DIFFERENTIAL/PLATELET
Abs Immature Granulocytes: 0.05 10*3/uL (ref 0.00–0.07)
Basophils Absolute: 0 10*3/uL (ref 0.0–0.1)
Basophils Relative: 0 %
Eosinophils Absolute: 0.1 10*3/uL (ref 0.0–0.5)
Eosinophils Relative: 1 %
HCT: 31.6 % — ABNORMAL LOW (ref 36.0–46.0)
Hemoglobin: 10.1 g/dL — ABNORMAL LOW (ref 12.0–15.0)
Immature Granulocytes: 1 %
Lymphocytes Relative: 24 %
Lymphs Abs: 2 10*3/uL (ref 0.7–4.0)
MCH: 29.1 pg (ref 26.0–34.0)
MCHC: 32 g/dL (ref 30.0–36.0)
MCV: 91.1 fL (ref 80.0–100.0)
Monocytes Absolute: 0.3 10*3/uL (ref 0.1–1.0)
Monocytes Relative: 4 %
Neutro Abs: 5.8 10*3/uL (ref 1.7–7.7)
Neutrophils Relative %: 70 %
Platelets: 249 10*3/uL (ref 150–400)
RBC: 3.47 MIL/uL — ABNORMAL LOW (ref 3.87–5.11)
RDW: 16.4 % — ABNORMAL HIGH (ref 11.5–15.5)
WBC: 8.3 10*3/uL (ref 4.0–10.5)
nRBC: 0 % (ref 0.0–0.2)

## 2020-08-26 LAB — MAGNESIUM: Magnesium: 1.6 mg/dL — ABNORMAL LOW (ref 1.7–2.4)

## 2020-08-26 MED ORDER — CYANOCOBALAMIN 1000 MCG/ML IJ SOLN
1000.0000 ug | Freq: Once | INTRAMUSCULAR | Status: AC
  Administered 2020-08-26: 1000 ug via INTRAMUSCULAR
  Filled 2020-08-26: qty 1

## 2020-08-26 MED ORDER — POTASSIUM CHLORIDE IN NACL 20-0.9 MEQ/L-% IV SOLN
Freq: Once | INTRAVENOUS | Status: AC
  Filled 2020-08-26: qty 1000

## 2020-08-26 MED ORDER — SODIUM CHLORIDE 0.9 % IV SOLN
10.0000 mg | Freq: Once | INTRAVENOUS | Status: AC
  Administered 2020-08-26: 10 mg via INTRAVENOUS
  Filled 2020-08-26: qty 10

## 2020-08-26 MED ORDER — SODIUM CHLORIDE 0.9% FLUSH
10.0000 mL | INTRAVENOUS | Status: DC | PRN
  Administered 2020-08-26: 10 mL via INTRAVENOUS
  Filled 2020-08-26: qty 10

## 2020-08-26 MED ORDER — PALONOSETRON HCL INJECTION 0.25 MG/5ML
0.2500 mg | Freq: Once | INTRAVENOUS | Status: AC
  Administered 2020-08-26: 0.25 mg via INTRAVENOUS
  Filled 2020-08-26: qty 5

## 2020-08-26 MED ORDER — SODIUM CHLORIDE 0.9 % IV SOLN
150.0000 mg | Freq: Once | INTRAVENOUS | Status: AC
  Administered 2020-08-26: 150 mg via INTRAVENOUS
  Filled 2020-08-26: qty 150

## 2020-08-26 MED ORDER — SODIUM CHLORIDE 0.9 % IV SOLN
75.0000 mg/m2 | Freq: Once | INTRAVENOUS | Status: AC
  Administered 2020-08-26: 162 mg via INTRAVENOUS
  Filled 2020-08-26: qty 162

## 2020-08-26 MED ORDER — SODIUM CHLORIDE 0.9 % IV SOLN
Freq: Once | INTRAVENOUS | Status: AC
  Filled 2020-08-26: qty 250

## 2020-08-26 MED ORDER — MAGNESIUM SULFATE 2 GM/50ML IV SOLN
2.0000 g | Freq: Once | INTRAVENOUS | Status: AC
  Administered 2020-08-26: 2 g via INTRAVENOUS
  Filled 2020-08-26: qty 50

## 2020-08-26 MED ORDER — HEPARIN SOD (PORK) LOCK FLUSH 100 UNIT/ML IV SOLN
500.0000 [IU] | Freq: Once | INTRAVENOUS | Status: AC
  Administered 2020-08-26: 500 [IU] via INTRAVENOUS
  Filled 2020-08-26: qty 5

## 2020-08-26 MED ORDER — PEMETREXED DISODIUM CHEMO INJECTION 500 MG
1000.0000 mg | Freq: Once | INTRAVENOUS | Status: AC
  Administered 2020-08-26: 1000 mg via INTRAVENOUS
  Filled 2020-08-26: qty 40

## 2020-08-26 MED ORDER — HEPARIN SOD (PORK) LOCK FLUSH 100 UNIT/ML IV SOLN
500.0000 [IU] | Freq: Once | INTRAVENOUS | Status: DC | PRN
  Filled 2020-08-26: qty 5

## 2020-08-26 NOTE — Progress Notes (Signed)
Patient here for oncology follow-up appointment, expresses no complaints or concerns at this time.    

## 2020-08-26 NOTE — Progress Notes (Signed)
  Oncology Nurse Navigator Documentation  Navigator Location: [facility] (08/26/20 1100)   )Navigator Encounter Type: Follow-up Appt;Treatment (08/26/20 1100)                     Patient Visit Type: MedOnc (08/26/20 1100) Treatment Phase: First Chemo Tx (08/26/20 1100) Barriers/Navigation Needs: No Barriers At This Time;No Needs (08/26/20 1100)   Interventions: None Required (08/26/20 1100)        met with patient prior to start chemotherapy today. All questions answered during visit. Reviewed upcoming appts. Instructed pt to call with any questions or needs. Pt verbalized understanding.              Time Spent with Patient: 30 (08/26/20 1100)

## 2020-08-27 ENCOUNTER — Telehealth: Payer: Self-pay

## 2020-08-27 NOTE — Telephone Encounter (Signed)
Telephone call to patient for follow up after receiving first infusion.   Patient states infusion went great.  States eating good and drinking plenty of fluids.   Denies any nausea or vomiting.  Encouraged patient to call for any concerns or questions. 

## 2020-08-28 NOTE — Progress Notes (Unsigned)
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   INTERVAL HISTORY: Patient returns to clinic today for laboratory work, further evaluation and to assess her toleration of cycle 1 of cisplatin and pemetrexed.  She had mild fatigue, but otherwise tolerated her treatment well.  The tenderness at the site of her port has now resolved.  She currently feels well. She has no neurologic complaints.  She denies any recent fevers or illnesses.  She has a good appetite and denies weight loss.  She has no chest pain, shortness of breath, cough, or hemoptysis.  She denies any nausea, vomiting, constipation, or diarrhea.  She has no urinary complaints.  Patient offers no further specific complaints today.  REVIEW OF SYSTEMS:   Review of Systems  Constitutional: Negative.  Negative for fever, malaise/fatigue and weight loss.  Respiratory: Negative.  Negative for cough, hemoptysis and shortness of breath.   Cardiovascular: Negative.  Negative for chest pain and leg swelling.  Gastrointestinal: Negative.  Negative for abdominal pain.  Genitourinary: Negative.  Negative for dysuria and flank pain.  Musculoskeletal: Negative.  Negative for back pain and joint pain.  Skin: Negative.  Negative for rash.  Neurological: Negative.  Negative for dizziness, focal weakness, weakness and headaches.  Psychiatric/Behavioral: Negative.  The patient is not nervous/anxious.     As per HPI. Otherwise, a complete review of systems is negative.  PAST MEDICAL HISTORY: Past Medical History:  Diagnosis Date  . Asthma   . COPD (chronic obstructive pulmonary disease) ([place])   . CVA (cerebral vascular accident) ([place])  2014  . Depression   . Diabetes mellitus ([place])   . Diabetes mellitus without complication ([place])   . Hepatitis C   . History of kidney stones   . Hyperlipidemia   . Iron deficiency anemia   . Obesity   . OSA on CPAP    Mild, noncompliant with CPAP said cpap machine was recalled, is waiting for a replacement   . Paranoia ([place])   . Schizoaffective disorder, bipolar type ([place])     PAST SURGICAL HISTORY: Past Surgical History:  Procedure Laterality Date  . BREAST CYST EXCISION Bilateral 2013   Benign  . CESAREAN SECTION    . CHOLECYSTECTOMY    . COLONOSCOPY    . ESOPHAGOGASTRODUODENOSCOPY  2020   done in [place]  . INGUINAL HERNIA REPAIR Left 2002  . INTERCOSTAL NERVE BLOCK N/A 07/12/2020   Procedure: INTERCOSTAL NERVE BLOCK;  Surgeon: Melrose Nakayama, MD;  Location: [facility];  Service: Thoracic;  Laterality: N/A;  . IR IMAGING GUIDED PORT INSERTION  08/20/2020  . LYMPH NODE DISSECTION Left 07/12/2020   Procedure: LYMPH NODE DISSECTION;  Surgeon: Melrose Nakayama, MD;  Location: [facility];  Service: Thoracic;  Laterality: Left;  . UMBILICAL HERNIA REPAIR    . VIDEO BRONCHOSCOPY WITH ENDOBRONCHIAL NAVIGATION N/A 06/07/2020   Procedure: VIDEO BRONCHOSCOPY WITH ENDOBRONCHIAL NAVIGATION;  Surgeon: Tyler Pita, MD;  Location: [facility];  Service: Pulmonary;  Laterality: N/A;    FAMILY HISTORY: No family history on file.  ADVANCED DIRECTIVES (Y/N):  N  HEALTH MAINTENANCE: Social History   Tobacco Use  . Smoking status: Former Smoker    Packs/day: 2.00    Years: 38.00  Pack years: 76.00    Types: Cigarettes    Quit date: 2012    Years since quitting: 10.2  . Smokeless tobacco: Former Network engineer  . Vaping Use: Never used  Substance Use Topics  . Alcohol use: Not Currently    Comment: occassionaly  . Drug use: Not Currently     Colonoscopy:  PAP:  Bone density:  Lipid panel:  No Known Allergies  Current Outpatient Medications  Medication Sig Dispense  Refill  . acetaminophen (TYLENOL) 650 MG CR tablet Take 650 mg by mouth 2 (two) times daily.    Marland Kitchen albuterol (VENTOLIN HFA) 108 (90 Base) MCG/ACT inhaler Inhale 2 puffs into the lungs every 6 (six) hours as needed for wheezing or shortness of breath.    Marland Kitchen atorvastatin (LIPITOR) 40 MG tablet Take 40 mg by mouth at bedtime.    . benztropine (COGENTIN) 1 MG tablet Take 1 mg by mouth at bedtime.    . citalopram (CELEXA) 20 MG tablet Take 20 mg by mouth every morning.    . diazepam (VALIUM) 10 MG tablet Take 1 tablet by mouth prior to MRI scan. (Patient not taking: No sig reported) 2 tablet 0  . Dulaglutide (TRULICITY) 1.5 OZ/3.6UY SOPN Inject 1.5 mg into the skin every Friday.    . Fluticasone-Umeclidin-Vilant (TRELEGY ELLIPTA) 100-62.5-25 MCG/INH AEPB Inhale 1 puff into the lungs daily.    . folic acid (FOLVITE) 1 MG tablet Take 1 tablet (1 mg total) by mouth daily. Start 5-7 days before Alimta chemotherapy. Continue until 21 days after Alimta completed. 100 tablet 3  . gabapentin (NEURONTIN) 300 MG capsule Take 300 mg by mouth 2 (two) times daily.    . insulin aspart (NOVOLOG) 100 UNIT/ML FlexPen Inject 9 Units into the skin 3 (three) times daily with meals. (Patient not taking: No sig reported) 15 mL 11  . insulin detemir (LEVEMIR) 100 UNIT/ML injection Inject 15 Units into the skin 2 (two) times daily.    Marland Kitchen latanoprost (XALATAN) 0.005 % ophthalmic solution Place 1 drop into both eyes daily.    Marland Kitchen lidocaine (LIDODERM) 5 % Place 2 patches onto the skin See admin instructions. Apply 1 patch to each foot every 12 hours    . lidocaine-prilocaine (EMLA) cream Apply to affected area once 30 g 3  . LORazepam (ATIVAN) 1 MG tablet Take 1 mg by mouth 2 (two) times daily.    Marland Kitchen LYRICA 75 MG capsule Take 1 capsule by mouth twice a day  for nerve pain    . ondansetron (ZOFRAN) 8 MG tablet Take 1 tablet (8 mg total) by mouth 2 (two) times daily as needed (Nausea or vomiting). Start if needed on the third day after  cisplatin. 60 tablet 1  . oxyCODONE (OXY IR/ROXICODONE) 5 MG immediate release tablet Take 1 tablet (5 mg total) by mouth every 4 (four) hours as needed for moderate pain. (Patient not taking: No sig reported) 30 tablet 0  . prochlorperazine (COMPAZINE) 10 MG tablet Take 1 tablet (10 mg total) by mouth every 6 (six) hours as needed (Nausea or vomiting). 60 tablet 1  . traMADol (ULTRAM) 50 MG tablet Take 50 mg by mouth every 6 (six) hours as needed.    . ziprasidone (GEODON) 80 MG capsule Take 80 mg by mouth at bedtime.     No current facility-administered medications for this visit.    OBJECTIVE: Vitals:   09/02/20 0946  BP: (!) 126/58  Pulse: 90  Resp: 18  Temp:  98.4 F (36.9 C)     Body mass index is 41.59 kg/m.    ECOG FS:0 - Asymptomatic  General: Well-developed, well-nourished, no acute distress.  Sitting in a wheelchair. Eyes: Pink conjunctiva, anicteric sclera. HEENT: Normocephalic, moist mucous membranes. Lungs: No audible wheezing or coughing. Heart: Regular rate and rhythm. Abdomen: Soft, nontender, no obvious distention. Musculoskeletal: No edema, cyanosis, or clubbing. Neuro: Alert, answering all questions appropriately. Cranial nerves grossly intact. Skin: No rashes or petechiae noted. Psych: Normal affect.  LAB RESULTS:  Lab Results  Component Value Date   NA 138 09/02/2020   K 3.2 (L) 09/02/2020   CL 101 09/02/2020   CO2 24 09/02/2020   GLUCOSE 179 (H) 09/02/2020   BUN 9 09/02/2020   CREATININE 0.77 09/02/2020   CALCIUM 9.2 09/02/2020   PROT 7.7 09/02/2020   ALBUMIN 3.9 09/02/2020   AST 27 09/02/2020   ALT 35 09/02/2020   ALKPHOS 60 09/02/2020   BILITOT 0.8 09/02/2020   GFRNONAA >60 09/02/2020   GFRAA >60 01/30/2020    Lab Results  Component Value Date   WBC 3.7 (L) 09/02/2020   NEUTROABS 2.3 09/02/2020   HGB 10.4 (L) 09/02/2020   HCT 32.3 (L) 09/02/2020   MCV 87.1 09/02/2020   PLT 195 09/02/2020     STUDIES: MR Brain W Wo  Contrast  Result Date: 08/26/2020 CLINICAL DATA:  Staging of newly diagnosed lung carcinoma. EXAM: MRI HEAD WITHOUT AND WITH CONTRAST TECHNIQUE: Multiplanar, multiecho pulse sequences of the brain and surrounding structures were obtained without and with intravenous contrast. CONTRAST:  41m GADAVIST GADOBUTROL 1 MMOL/ML IV SOLN COMPARISON:  None. FINDINGS: Brain: No acute infarct, mass effect or extra-axial collection. No acute or chronic hemorrhage. There is multifocal hyperintense T2-weighted signal within the white matter. Parenchymal volume and CSF spaces are normal. The midline structures are normal. There is no abnormal contrast enhancement. Vascular: Major flow voids are preserved. Skull and upper cervical spine: Normal calvarium and skull base. Visualized upper cervical spine and soft tissues are normal. Sinuses/Orbits:No paranasal sinus fluid levels or advanced mucosal thickening. No mastoid or middle ear effusion. Normal orbits. IMPRESSION: 1. No evidence of intracranial metastatic disease. 2. Multifocal hyperintense T2-weighted signal within the white matter, most consistent with chronic microvascular ischemia. Electronically Signed   By: KUlyses JarredM.D.   On: 08/26/2020 00:51   IR IMAGING GUIDED PORT INSERTION  Result Date: 08/20/2020 INDICATION: History of metastatic lung cancer. In need of durable intravenous access for chemotherapy administration. EXAM: IMPLANTED PORT A CATH PLACEMENT WITH ULTRASOUND AND FLUOROSCOPIC GUIDANCE COMPARISON:  PET-CT-05/06/2020 MEDICATIONS: None ANESTHESIA/SEDATION: Moderate (conscious) sedation was employed during this procedure. A total of Versed 1 mg and Fentanyl 50 mcg was administered intravenously. Moderate Sedation Time: 26 minutes. The patient's level of consciousness and vital signs were monitored continuously by radiology nursing throughout the procedure under my direct supervision. CONTRAST:  None FLUOROSCOPY TIME:  36 seconds (7 mGy) COMPLICATIONS:  None immediate. PROCEDURE: The procedure, risks, benefits, and alternatives were explained to the patient. Questions regarding the procedure were encouraged and answered. The patient understands and consents to the procedure. The right neck and chest were prepped with chlorhexidine in a sterile fashion, and a sterile drape was applied covering the operative field. Maximum barrier sterile technique with sterile gowns and gloves were used for the procedure. A timeout was performed prior to the initiation of the procedure. Local anesthesia was provided with 1% lidocaine with epinephrine. After creating a small venotomy incision, a micropuncture kit  was utilized to access the internal jugular vein. Real-time ultrasound guidance was utilized for vascular access including the acquisition of a permanent ultrasound image documenting patency of the accessed vessel. The microwire was utilized to measure appropriate catheter length. A subcutaneous port pocket was then created along the upper chest wall utilizing a combination of sharp and blunt dissection. The pocket was irrigated with sterile saline. A single lumen "standard sized" power injectable port was chosen for placement. The 8 Fr catheter was tunneled from the port pocket site to the venotomy incision. The port was placed in the pocket. The external catheter was trimmed to appropriate length. At the venotomy, an 8 Fr peel-away sheath was placed over a guidewire under fluoroscopic guidance. The catheter was then placed through the sheath and the sheath was removed. Final catheter positioning was confirmed and documented with a fluoroscopic spot radiograph. The port was accessed with a Huber needle, aspirated and flushed with heparinized saline. The venotomy site was closed with an interrupted 4-0 Vicryl suture. The port pocket incision was closed with interrupted 2-0 Vicryl suture. The skin was opposed with a running subcuticular 4-0 Vicryl suture. Dermabond and  Steri-strips were applied to both incisions. Dressings were applied. The patient tolerated the procedure well without immediate post procedural complication. FINDINGS: After catheter placement, the tip lies within the superior cavoatrial junction. The catheter aspirates and flushes normally and is ready for immediate use. IMPRESSION: Successful placement of a right internal jugular approach power injectable Port-A-Cath. The catheter is ready for immediate use. Electronically Signed   By: Sandi Mariscal M.D.   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  PLAN:    1. 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  Patient also noted to have a right iliac bony hypermetabolism as well as a soft tissue mass adjacent suspicious for isolated metastasis.  Soft tissue mass was biopsied on May 19, 2020 which was negative for malignancy.  This lesion is still suspicious, so therefore we will continue to monitor closely. Patient underwent resection on July 12, 2020 which reviewed for positive lymph nodes increasing her stage IIb.  MRI of the brain on August 25, 2020 for metastatic disease.  Plan to give adjuvant chemotherapy using cisplatin and pemetrexed every 3 weeks for 4 cycles.  Patient received cycle 1 of treatment last week and tolerated it well.  Return to clinic in 2 weeks for further evaluation and consideration of cycle 2.    2.  Anemia: Hemoglobin slightly improved to 10.4, monitor. 3.  Leukopenia: Mild, monitor.   Patient expressed understanding and was in agreement with this plan. She also understands that She can call clinic at any time with any questions,  concerns, or complaints.   Cancer Staging Primary adenocarcinoma of lower lobe of left lung [facility]) Staging form: Lung, AJCC 8th Edition - Pathologic stage from 07/27/2020: Stage IIB (pT2b, pN1, cM0) - Signed by Lloyd Huger, MD on 07/27/2020   Lloyd Huger, MD   09/02/2020 10:33 AM

## 2020-08-30 ENCOUNTER — Encounter: Payer: Self-pay | Admitting: Oncology

## 2020-09-02 ENCOUNTER — Inpatient Hospital Stay: Payer: Medicaid Other

## 2020-09-02 ENCOUNTER — Inpatient Hospital Stay (HOSPITAL_BASED_OUTPATIENT_CLINIC_OR_DEPARTMENT_OTHER): Payer: Medicaid Other | Admitting: Oncology

## 2020-09-02 ENCOUNTER — Other Ambulatory Visit: Payer: Self-pay

## 2020-09-02 ENCOUNTER — Encounter: Payer: Self-pay | Admitting: *Deleted

## 2020-09-02 VITALS — BP 126/58 | HR 90 | Temp 98.4°F | Resp 18 | Wt 227.4 lb

## 2020-09-02 DIAGNOSIS — C3432 Malignant neoplasm of lower lobe, left bronchus or lung: Secondary | ICD-10-CM

## 2020-09-02 DIAGNOSIS — Z5111 Encounter for antineoplastic chemotherapy: Secondary | ICD-10-CM | POA: Diagnosis not present

## 2020-09-02 LAB — CBC WITH DIFFERENTIAL/PLATELET
Abs Immature Granulocytes: 0.02 10*3/uL (ref 0.00–0.07)
Basophils Absolute: 0 10*3/uL (ref 0.0–0.1)
Basophils Relative: 0 %
Eosinophils Absolute: 0 10*3/uL (ref 0.0–0.5)
Eosinophils Relative: 1 %
HCT: 32.3 % — ABNORMAL LOW (ref 36.0–46.0)
Hemoglobin: 10.4 g/dL — ABNORMAL LOW (ref 12.0–15.0)
Immature Granulocytes: 1 %
Lymphocytes Relative: 35 %
Lymphs Abs: 1.3 10*3/uL (ref 0.7–4.0)
MCH: 28 pg (ref 26.0–34.0)
MCHC: 32.2 g/dL (ref 30.0–36.0)
MCV: 87.1 fL (ref 80.0–100.0)
Monocytes Absolute: 0.1 10*3/uL (ref 0.1–1.0)
Monocytes Relative: 3 %
Neutro Abs: 2.3 10*3/uL (ref 1.7–7.7)
Neutrophils Relative %: 60 %
Platelets: 195 10*3/uL (ref 150–400)
RBC: 3.71 MIL/uL — ABNORMAL LOW (ref 3.87–5.11)
RDW: 15.8 % — ABNORMAL HIGH (ref 11.5–15.5)
WBC: 3.7 10*3/uL — ABNORMAL LOW (ref 4.0–10.5)
nRBC: 0 % (ref 0.0–0.2)

## 2020-09-02 LAB — COMPREHENSIVE METABOLIC PANEL
ALT: 35 U/L (ref 0–44)
AST: 27 U/L (ref 15–41)
Albumin: 3.9 g/dL (ref 3.5–5.0)
Alkaline Phosphatase: 60 U/L (ref 38–126)
Anion gap: 13 (ref 5–15)
BUN: 9 mg/dL (ref 8–23)
CO2: 24 mmol/L (ref 22–32)
Calcium: 9.2 mg/dL (ref 8.9–10.3)
Chloride: 101 mmol/L (ref 98–111)
Creatinine, Ser: 0.77 mg/dL (ref 0.44–1.00)
GFR, Estimated: >60 mL/min (ref >60)
Glucose, Bld: 179 mg/dL — ABNORMAL HIGH (ref 70–99)
Potassium: 3.2 mmol/L — ABNORMAL LOW (ref 3.5–5.1)
Sodium: 138 mmol/L (ref 135–145)
Total Bilirubin: 0.8 mg/dL (ref 0.3–1.2)
Total Protein: 7.7 g/dL (ref 6.5–8.1)

## 2020-09-02 LAB — MAGNESIUM: Magnesium: 1.7 mg/dL (ref 1.7–2.4)

## 2020-09-02 NOTE — Progress Notes (Signed)
  Oncology Nurse Navigator Documentation  Navigator Location: [facility]-Med Onc (09/02/20 1100)   )Navigator Encounter Type: Follow-up Appt (09/02/20 1100)                     Patient Visit Type: MedOnc (09/02/20 1100)   Barriers/Navigation Needs: No Barriers At This Time;No Needs;No Questions (09/02/20 1100)   Interventions: None Required (09/02/20 1100)         met with patient during follow up visit. Pt did not have any questions or needs during visit. Reviewed upcoming appts. Instructed to call with any further questions or needs. Pt verbalized understanding.             Time Spent with Patient: 30 (09/02/20 1100)

## 2020-09-06 ENCOUNTER — Other Ambulatory Visit: Payer: Self-pay | Admitting: Thoracic Surgery (Cardiothoracic Vascular Surgery)

## 2020-09-06 DIAGNOSIS — C3432 Malignant neoplasm of lower lobe, left bronchus or lung: Secondary | ICD-10-CM

## 2020-09-07 ENCOUNTER — Other Ambulatory Visit: Payer: Self-pay

## 2020-09-07 ENCOUNTER — Ambulatory Visit (INDEPENDENT_AMBULATORY_CARE_PROVIDER_SITE_OTHER): Payer: Self-pay | Admitting: Thoracic Surgery (Cardiothoracic Vascular Surgery)

## 2020-09-07 ENCOUNTER — Encounter: Payer: Medicaid Other | Admitting: Thoracic Surgery (Cardiothoracic Vascular Surgery)

## 2020-09-07 ENCOUNTER — Encounter: Payer: Self-pay | Admitting: Thoracic Surgery (Cardiothoracic Vascular Surgery)

## 2020-09-07 ENCOUNTER — Ambulatory Visit
Admission: RE | Admit: 2020-09-07 | Discharge: 2020-09-07 | Disposition: A | Discharge status: Home / Self Care | Payer: Medicaid Other | Source: Ambulatory Visit | Attending: Thoracic Surgery (Cardiothoracic Vascular Surgery) | Admitting: Thoracic Surgery (Cardiothoracic Vascular Surgery)

## 2020-09-07 VITALS — BP 130/77 | HR 89 | Temp 97.6°F | Resp 20 | Ht 62.0 in | Wt 235.0 lb

## 2020-09-07 DIAGNOSIS — Z902 Acquired absence of lung [part of]: Secondary | ICD-10-CM

## 2020-09-07 DIAGNOSIS — C3432 Malignant neoplasm of lower lobe, left bronchus or lung: Secondary | ICD-10-CM

## 2020-09-07 DIAGNOSIS — Z9889 Other specified postprocedural states: Secondary | ICD-10-CM

## 2020-09-07 IMAGING — DX DG CHEST 2V
2 series · 2 of 2 positions shown · non-contrast
Comparison: [DATE]

CLINICAL DATA: Follow-up lung cancer treatment

EXAM:
CHEST - 2 VIEW

[dg chest 2 view (1 of 2)]
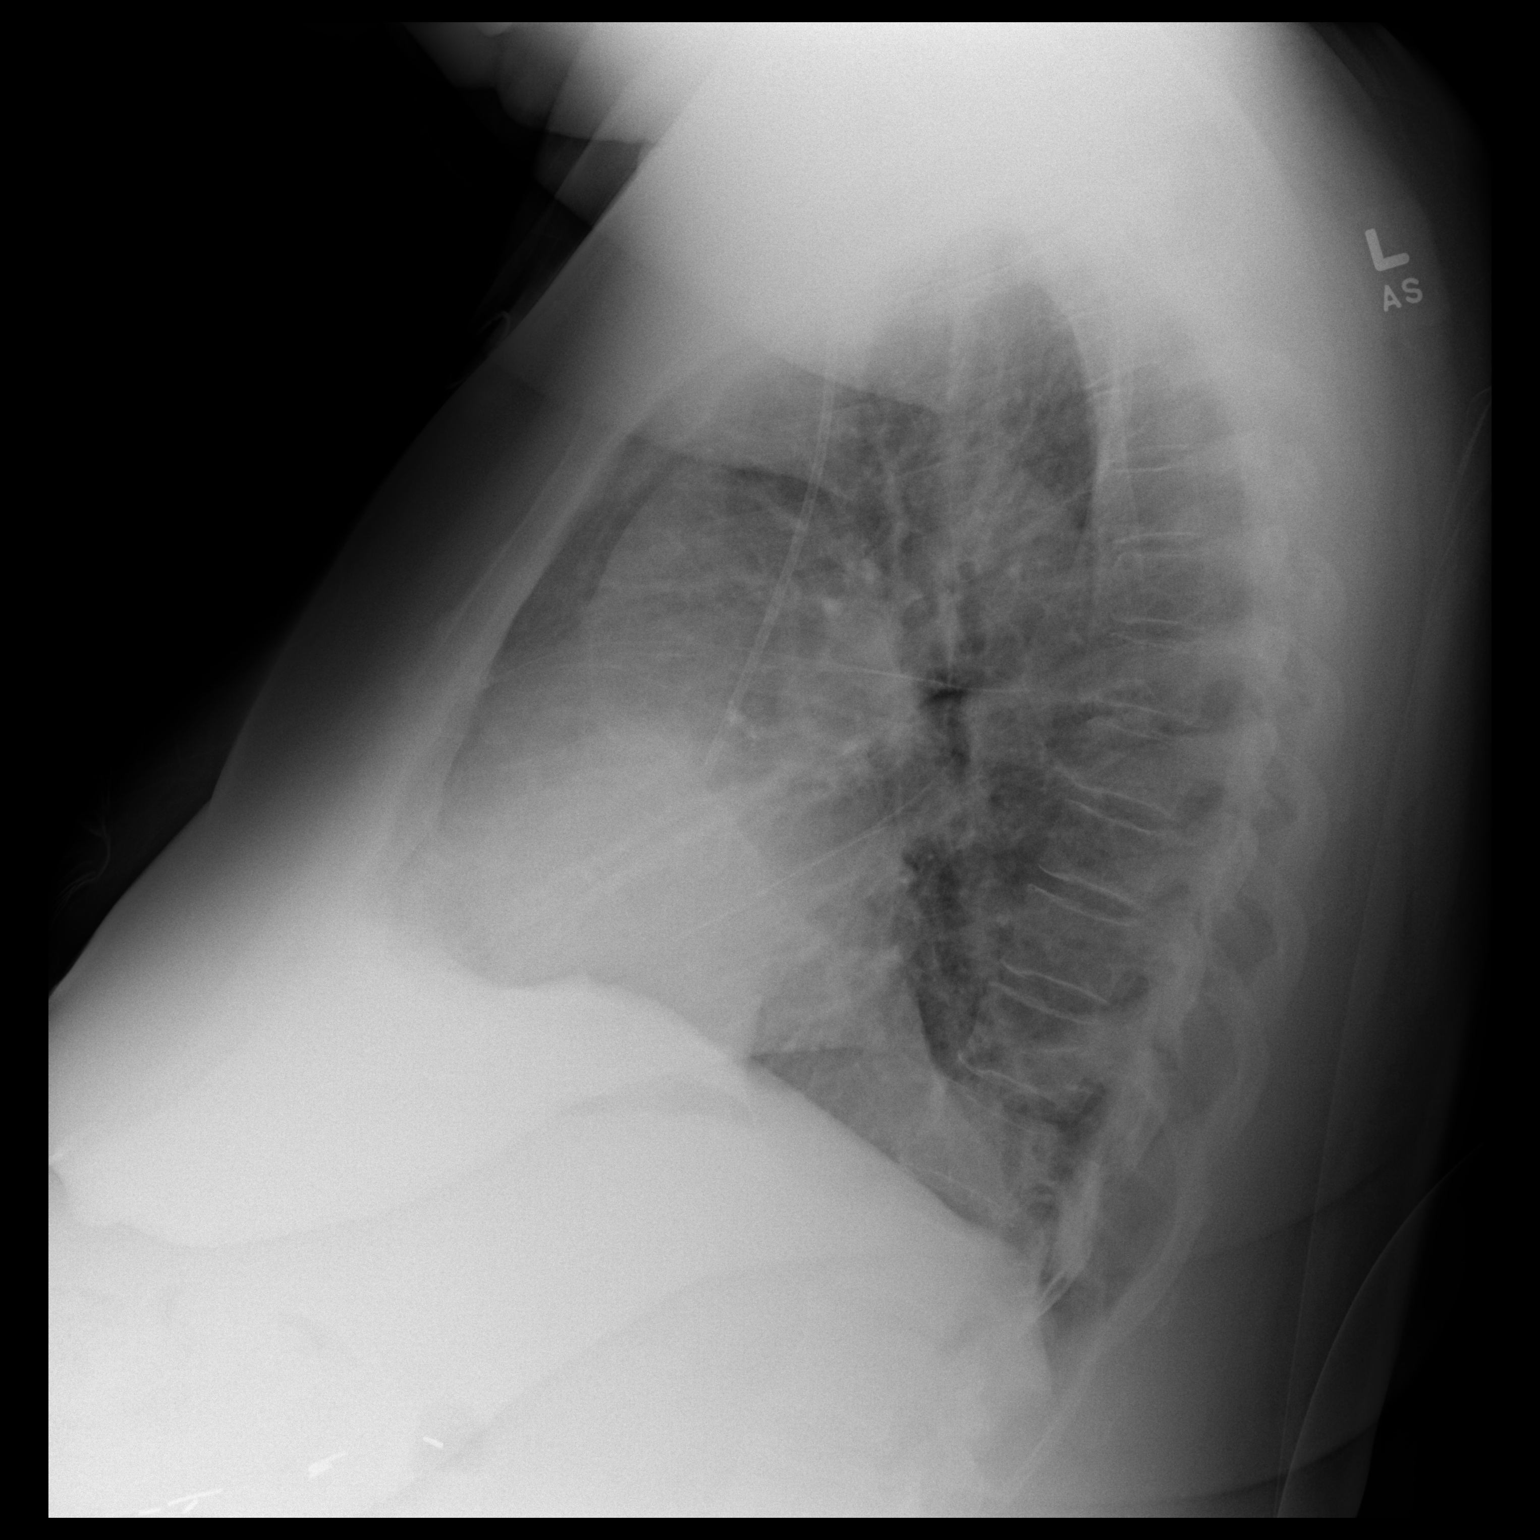

[dg chest 2 view (2 of 2)]
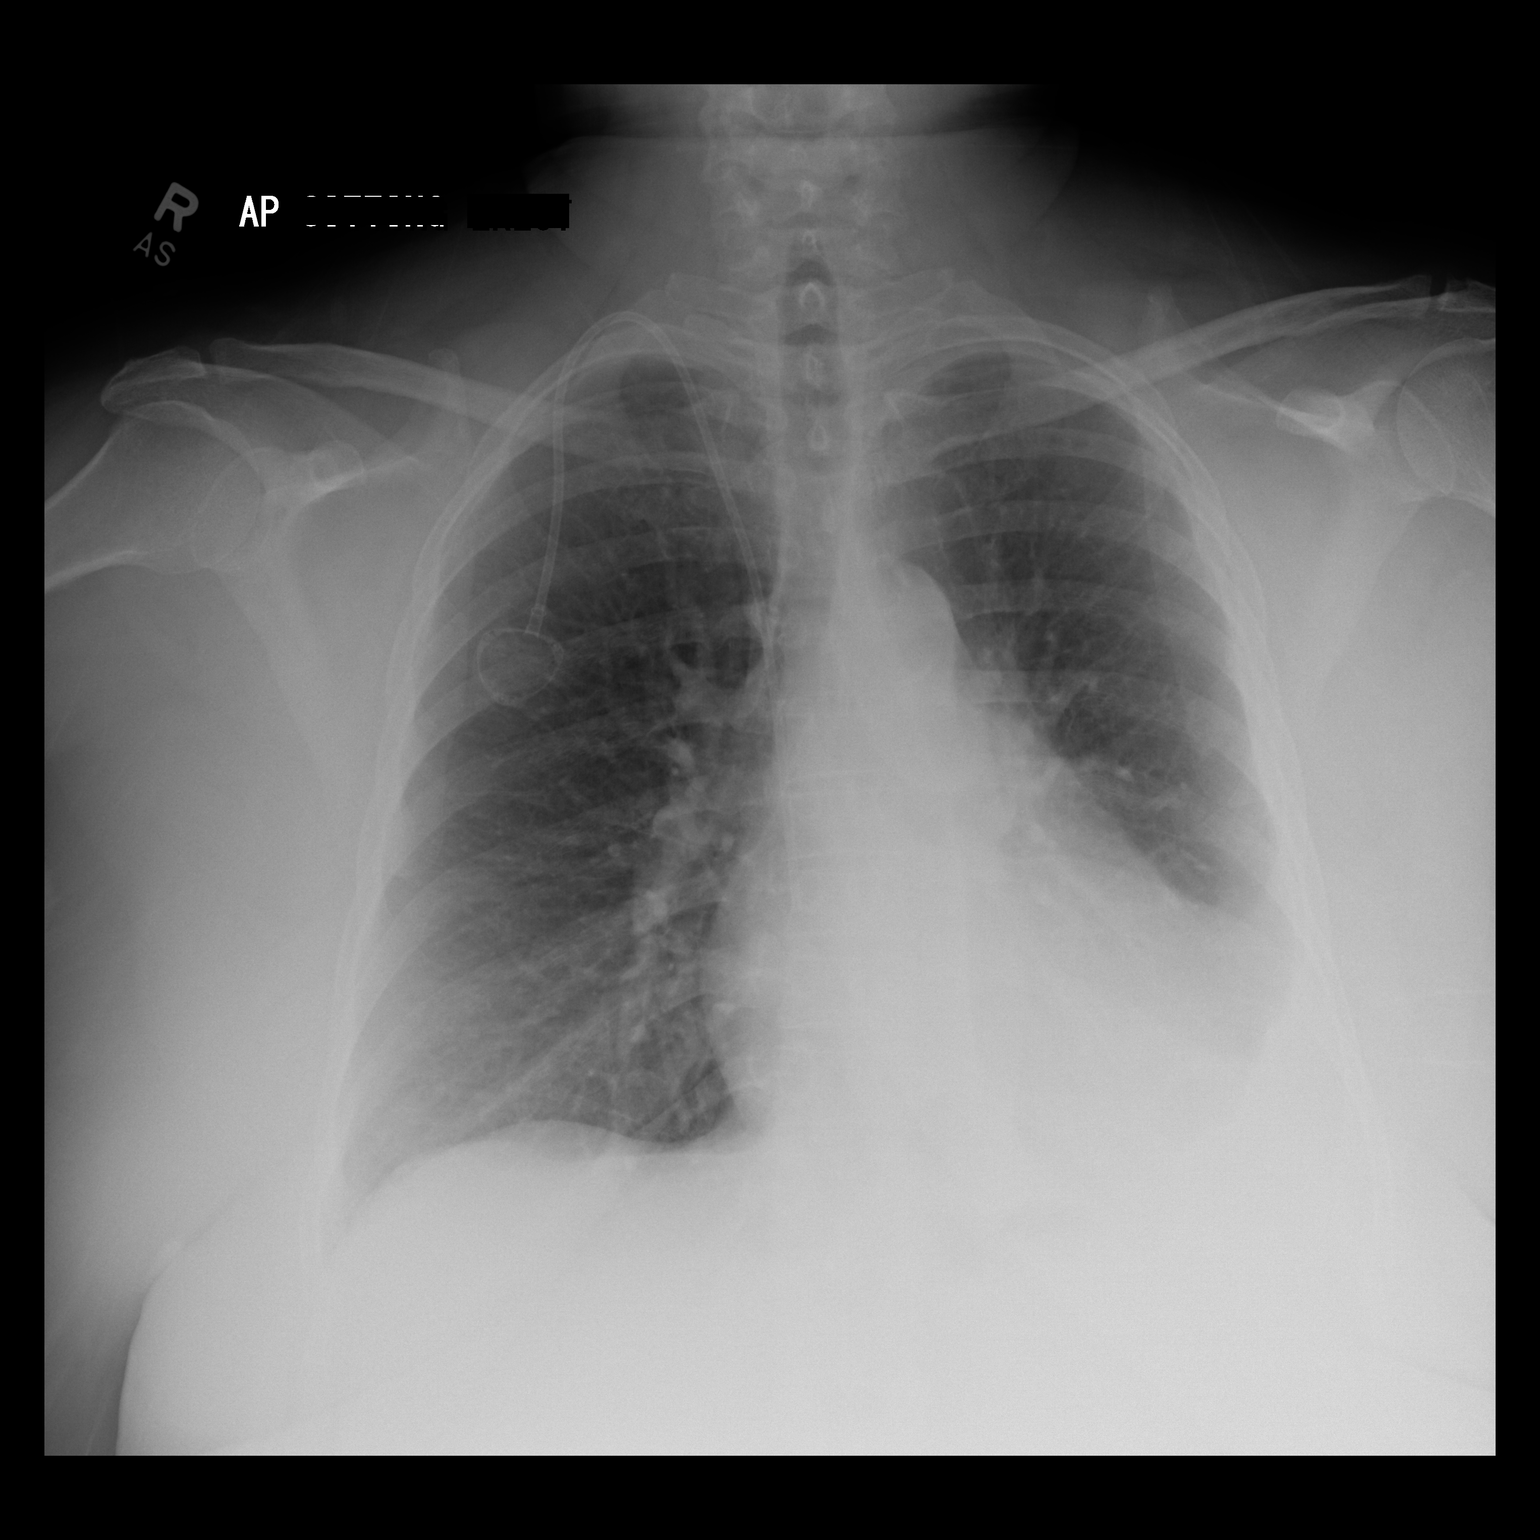

[2 of 2 positions shown; findings below may reference images not displayed]

FINDINGS: Heart size remains normal. Power port inserted from a right internal
jugular approach has its tip at the SVC RA junction. The right chest
is clear. Chronic pleural density at the left base in this patient
who is had previous left lower lobectomy. No change or worsening
finding. No visible bone abnormality.
IMPRESSION: No active disease. Chronic pleural density at the left base in this
patient who is had previous left lower lobectomy.

## 2020-09-07 NOTE — Progress Notes (Signed)
[address]       [place] [address]             [phone]     HPI: Ms. Deuser returns for a scheduled postoperative follow-up visit  Natalie [name] is a 60 year old woman with a history of tobacco abuse, COPD, asthma, CVA, insulin-dependent diabetes, diabetic neuropathy, hepatitis C, hyperlipidemia, obesity, sleep apnea, and schizoaffective disorder.  She was a heavy smoker 2 packs a day prior to quitting in 2012.  She was treated for diabetic ketoacidosis in the fall 2021.  As part of her work-up she had a chest x-ray which showed a left lung mass.  CT showed a 3.1 x 2.1 cm left lower lobe lung mass.  The mass was hypermetabolic on PET/CT.  There was some hypermetabolic activity in the pelvis.  Biopsy of that was negative for malignancy.  Dr. Patsey Berthold did navigational bronchoscopy and biopsies of the lung mass showed non-small cell carcinoma.  I did a robotic left lower lobectomy on 07/12/2020.  Pathology showed a T2, N1, stage IIb adenocarcinoma.  I last saw her on 07/27/2020.  She was having a lot of pain at that time and was still using oxycodone every 4-6 hours.  Since then she has had significant improvement in her pain.  The only time she notices it is when she tries to lie on her left side.  She is still taking Lyrica.  She is no longer taking oxycodone.  She has started adjuvant chemotherapy and has had 1 cycle so far.  She tolerated that well.  Past Medical History:  Diagnosis Date  . Asthma   . COPD (chronic obstructive pulmonary disease) ([place])   . CVA (cerebral vascular accident) ([place]) 2014  . Depression   . Diabetes mellitus ([place])   . Diabetes mellitus without complication ([place])   . Hepatitis C   . History of kidney stones   . Hyperlipidemia   . Iron deficiency anemia   . Obesity   . OSA on CPAP    Mild, noncompliant with CPAP said cpap machine was recalled, is waiting for a replacement   . Paranoia ([place])   . Schizoaffective disorder,  bipolar type [facility])     Current Outpatient Medications  Medication Sig Dispense Refill  . acetaminophen (TYLENOL) 650 MG CR tablet Take 650 mg by mouth 2 (two) times daily.    Marland Kitchen albuterol (VENTOLIN HFA) 108 (90 Base) MCG/ACT inhaler Inhale 2 puffs into the lungs every 6 (six) hours as needed for wheezing or shortness of breath.    Marland Kitchen atorvastatin (LIPITOR) 40 MG tablet Take 40 mg by mouth at bedtime.    . benztropine (COGENTIN) 1 MG tablet Take 1 mg by mouth at bedtime.    . citalopram (CELEXA) 20 MG tablet Take 20 mg by mouth every morning.    . diazepam (VALIUM) 10 MG tablet Take 1 tablet by mouth prior to MRI scan. 2 tablet 0  . Dulaglutide (TRULICITY) 1.5 YI/9.4WN SOPN Inject 1.5 mg into the skin every Friday.    . Fluticasone-Umeclidin-Vilant (TRELEGY ELLIPTA) 100-62.5-25 MCG/INH AEPB Inhale 1 puff into the lungs daily.    . folic acid (FOLVITE) 1 MG tablet Take 1 tablet (1 mg total) by mouth daily. Start 5-7 days before Alimta chemotherapy. Continue until 21 days after Alimta completed. 100 tablet 3  . gabapentin (NEURONTIN) 300 MG capsule Take 300 mg by mouth 2 (two) times daily.    . insulin aspart (NOVOLOG) 100  UNIT/ML FlexPen Inject 9 Units into the skin 3 (three) times daily with meals. 15 mL 11  . insulin detemir (LEVEMIR) 100 UNIT/ML injection Inject 15 Units into the skin 2 (two) times daily.    Marland Kitchen latanoprost (XALATAN) 0.005 % ophthalmic solution Place 1 drop into both eyes daily.    Marland Kitchen lidocaine (LIDODERM) 5 % Place 2 patches onto the skin See admin instructions. Apply 1 patch to each foot every 12 hours    . lidocaine-prilocaine (EMLA) cream Apply to affected area once 30 g 3  . LORazepam (ATIVAN) 1 MG tablet Take 1 mg by mouth 2 (two) times daily.    Marland Kitchen LYRICA 75 MG capsule Take 1 capsule by mouth twice a day  for nerve pain    . ondansetron (ZOFRAN) 8 MG tablet Take 1 tablet (8 mg total) by mouth 2 (two) times daily as needed (Nausea or vomiting). Start if needed on the third day  after cisplatin. 60 tablet 1  . oxyCODONE (OXY IR/ROXICODONE) 5 MG immediate release tablet Take 1 tablet (5 mg total) by mouth every 4 (four) hours as needed for moderate pain. 30 tablet 0  . prochlorperazine (COMPAZINE) 10 MG tablet Take 1 tablet (10 mg total) by mouth every 6 (six) hours as needed (Nausea or vomiting). 60 tablet 1  . traMADol (ULTRAM) 50 MG tablet Take 50 mg by mouth every 6 (six) hours as needed.    . ziprasidone (GEODON) 80 MG capsule Take 80 mg by mouth at bedtime.     No current facility-administered medications for this visit.    Physical Exam BP 130/77   Pulse 89   Temp 97.6 F (36.4 C) (Skin)   Resp 20   Ht 5\' 2"  (1.575 m)   Wt 235 lb (106.6 kg)   SpO2 93% Comment: RA  BMI 42.98 kg/m  Obese 60 year old woman in no acute distress Alert and oriented x3 with no focal motor deficits Ambulates with cane Lungs diminished at left base but otherwise clear Cardiac regular rate and rhythm Incisions well-healed  Diagnostic Tests: I personally reviewed the chest x-ray images.  Postoperative changes from left lower lobectomy.  Impression: Natalie [name] is a 60 year old woman with a history of tobacco abuse, COPD, asthma, CVA, insulin-dependent diabetes, diabetic neuropathy, hepatitis C, hyperlipidemia, obesity, sleep apnea, and schizoaffective disorder.  She was a heavy smoker 2 packs a day prior to quitting in 2012.  She was found to have a lung nodule last fall.  A biopsy showed adenocarcinoma.  Clinically she had stage Ib disease.  I did a robotic left lower lobectomy and node dissection on 07/12/2020.  Pathologically she upstaged to be (T2,N1).  The last time I saw her about 3 weeks from surgery she was still having a lot of pain.  That has improved dramatically and she is no longer taking any narcotics.  She does still have some pain when she lies on her left side, unfortunately she likes to sleep on that side.  She will continue the Lyrica that she is on for  her diabetic neuropathy.  She is currently undergoing chemotherapy with Dr. [place] Ormond.  I will defer follow-up to him.  Plan: Follow-up with Dr. [place] Ormond I will be happy to see Mrs. Kinlaw back anytime in the future if I can be of any further assistance with her care  Melrose Nakayama, MD [facility] [phone]

## 2020-09-09 NOTE — Progress Notes (Signed)
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   INTERVAL HISTORY: Patient returns to clinic today for further evaluation and consideration of cycle 2 of cisplatin and pemetrexed.  She is tolerating her treatments well without significant side effects.  She currently feels well and is asymptomatic.  She has no neurologic complaints.  She denies any recent fevers or illnesses.  She has a good appetite and denies weight loss.  She has no chest pain, shortness of breath, cough, or hemoptysis.  She denies any nausea, vomiting, constipation, or diarrhea.  She has no urinary complaints.  Patient offers no specific complaints today.  REVIEW OF SYSTEMS:   Review of Systems  Constitutional: Negative.  Negative for fever, malaise/fatigue and weight loss.  Respiratory: Negative.  Negative for cough, hemoptysis and shortness of breath.   Cardiovascular: Negative.  Negative for chest pain and leg swelling.  Gastrointestinal: Negative.  Negative for abdominal pain.  Genitourinary: Negative.  Negative for dysuria and flank pain.  Musculoskeletal: Negative.  Negative for back pain and joint pain.  Skin: Negative.  Negative for rash.  Neurological: Negative.  Negative for dizziness, focal weakness, weakness and headaches.  Psychiatric/Behavioral: Negative.  The patient is not nervous/anxious.     As per HPI. Otherwise, a complete review of systems is negative.  PAST MEDICAL HISTORY: Past Medical History:  Diagnosis Date  . Asthma   . COPD (chronic obstructive pulmonary disease) ([place])   . CVA (cerebral vascular accident) ([place]) 2014  . Depression   . Diabetes mellitus ([place])   . Diabetes  mellitus without complication ([place])   . Hepatitis C   . History of kidney stones   . Hyperlipidemia   . Iron deficiency anemia   . Obesity   . OSA on CPAP    Mild, noncompliant with CPAP said cpap machine was recalled, is waiting for a replacement   . Paranoia ([place])   . Schizoaffective disorder, bipolar type ([place])     PAST SURGICAL HISTORY: Past Surgical History:  Procedure Laterality Date  . BREAST CYST EXCISION Bilateral 2013   Benign  . CESAREAN SECTION    . CHOLECYSTECTOMY    . COLONOSCOPY    . ESOPHAGOGASTRODUODENOSCOPY  2020   done in [place]  . INGUINAL HERNIA REPAIR Left 2002  . INTERCOSTAL NERVE BLOCK N/A 07/12/2020   Procedure: INTERCOSTAL NERVE BLOCK;  Surgeon: Melrose Nakayama, MD;  Location: [place];  Service: Thoracic;  Laterality: N/A;  . IR IMAGING GUIDED PORT INSERTION  08/20/2020  . LYMPH NODE DISSECTION Left 07/12/2020   Procedure: LYMPH NODE DISSECTION;  Surgeon: Melrose Nakayama, MD;  Location: [place];  Service: Thoracic;  Laterality: Left;  . UMBILICAL HERNIA REPAIR    . VIDEO BRONCHOSCOPY WITH ENDOBRONCHIAL NAVIGATION N/A 06/07/2020   Procedure: VIDEO BRONCHOSCOPY WITH ENDOBRONCHIAL NAVIGATION;  Surgeon: Tyler Pita, MD;  Location: [facility];  Service: Pulmonary;  Laterality: N/A;    FAMILY HISTORY: History reviewed. No pertinent family history.  ADVANCED DIRECTIVES (Y/N):  N  HEALTH MAINTENANCE: Social History   Tobacco Use  . Smoking status: Former Smoker    Packs/day: 2.00    Years: 38.00    Pack years: 76.00    Types: Cigarettes    Quit  date: 2012    Years since quitting: 10.2  . Smokeless tobacco: Former Network engineer  . Vaping Use: Never used  Substance Use Topics  . Alcohol use: Not Currently    Comment: occassionaly  . Drug use: Not Currently     Colonoscopy:  PAP:  Bone density:  Lipid panel:  No Known Allergies  Current Outpatient Medications  Medication Sig Dispense Refill  . acetaminophen (TYLENOL) 650 MG CR  tablet Take 650 mg by mouth 2 (two) times daily.    Marland Kitchen albuterol (VENTOLIN HFA) 108 (90 Base) MCG/ACT inhaler Inhale 2 puffs into the lungs every 6 (six) hours as needed for wheezing or shortness of breath.    Marland Kitchen atorvastatin (LIPITOR) 40 MG tablet Take 40 mg by mouth at bedtime.    . benztropine (COGENTIN) 1 MG tablet Take 1 mg by mouth at bedtime.    . citalopram (CELEXA) 20 MG tablet Take 20 mg by mouth every morning.    . diazepam (VALIUM) 10 MG tablet Take 1 tablet by mouth prior to MRI scan. 2 tablet 0  . Dulaglutide (TRULICITY) 1.5 CB/7.6EG SOPN Inject 1.5 mg into the skin every Friday.    . Fluticasone-Umeclidin-Vilant (TRELEGY ELLIPTA) 100-62.5-25 MCG/INH AEPB Inhale 1 puff into the lungs daily.    . folic acid (FOLVITE) 1 MG tablet Take 1 tablet (1 mg total) by mouth daily. Start 5-7 days before Alimta chemotherapy. Continue until 21 days after Alimta completed. 100 tablet 3  . gabapentin (NEURONTIN) 300 MG capsule Take 300 mg by mouth 2 (two) times daily.    . insulin aspart (NOVOLOG) 100 UNIT/ML FlexPen Inject 9 Units into the skin 3 (three) times daily with meals. 15 mL 11  . insulin detemir (LEVEMIR) 100 UNIT/ML injection Inject 15 Units into the skin 2 (two) times daily.    Marland Kitchen latanoprost (XALATAN) 0.005 % ophthalmic solution Place 1 drop into both eyes daily.    Marland Kitchen lidocaine (LIDODERM) 5 % Place 2 patches onto the skin See admin instructions. Apply 1 patch to each foot every 12 hours    . lidocaine-prilocaine (EMLA) cream Apply to affected area once 30 g 3  . LORazepam (ATIVAN) 1 MG tablet Take 1 mg by mouth 2 (two) times daily.    Marland Kitchen LYRICA 75 MG capsule Take 1 capsule by mouth twice a day  for nerve pain    . ondansetron (ZOFRAN) 8 MG tablet Take 1 tablet (8 mg total) by mouth 2 (two) times daily as needed (Nausea or vomiting). Start if needed on the third day after cisplatin. 60 tablet 1  . oxyCODONE (OXY IR/ROXICODONE) 5 MG immediate release tablet Take 1 tablet (5 mg total) by mouth  every 4 (four) hours as needed for moderate pain. 30 tablet 0  . prochlorperazine (COMPAZINE) 10 MG tablet Take 1 tablet (10 mg total) by mouth every 6 (six) hours as needed (Nausea or vomiting). 60 tablet 1  . traMADol (ULTRAM) 50 MG tablet Take 50 mg by mouth every 6 (six) hours as needed.    . ziprasidone (GEODON) 80 MG capsule Take 80 mg by mouth at bedtime.     No current facility-administered medications for this visit.   Facility-Administered Medications Ordered in Other Visits  Medication Dose Route Frequency Provider Last Rate Last Admin  . heparin lock flush 100 unit/mL  500 Units Intravenous Once Earlie Server, MD      . sodium chloride flush (NS) 0.9 % injection 10 mL  10 mL  Intravenous PRN Rickard Patience, MD   10 mL at 09/16/20 0909    OBJECTIVE: Vitals:   09/16/20 0919  BP: 125/81  Pulse: 89  Resp: 20  Temp: 98.1 F (36.7 C)     Body mass index is 41.74 kg/m.    ECOG FS:0 - Asymptomatic  General: Well-developed, well-nourished, no acute distress.  Sitting in wheelchair. Eyes: Pink conjunctiva, anicteric sclera. HEENT: Normocephalic, moist mucous membranes. Lungs: No audible wheezing or coughing. Heart: Regular rate and rhythm. Abdomen: Soft, nontender, no obvious distention. Musculoskeletal: No edema, cyanosis, or clubbing. Neuro: Alert, answering all questions appropriately. Cranial nerves grossly intact. Skin: No rashes or petechiae noted. Psych: Normal affect.  LAB RESULTS:  Lab Results  Component Value Date   NA 138 09/16/2020   K 3.5 09/16/2020   CL 104 09/16/2020   CO2 25 09/16/2020   GLUCOSE 196 (H) 09/16/2020   BUN 10 09/16/2020   CREATININE 0.74 09/16/2020   CALCIUM 8.9 09/16/2020   PROT 7.2 09/16/2020   ALBUMIN 3.7 09/16/2020   AST 23 09/16/2020   ALT 26 09/16/2020   ALKPHOS 69 09/16/2020   BILITOT 0.5 09/16/2020   GFRNONAA >60 09/16/2020   GFRAA >60 01/30/2020    Lab Results  Component Value Date   WBC 8.2 09/16/2020   NEUTROABS 5.2 09/16/2020    HGB 10.4 (L) 09/16/2020   HCT 32.6 (L) 09/16/2020   MCV 87.9 09/16/2020   PLT 434 (H) 09/16/2020     STUDIES: DG Chest 2 View  Result Date: 09/07/2020 CLINICAL DATA:  Follow-up lung cancer treatment EXAM: CHEST - 2 VIEW COMPARISON:  07/27/2020 FINDINGS: Heart size remains normal. Power port inserted from a right internal jugular approach has its tip at the SVC RA junction. The right chest is clear. Chronic pleural density at the left base in this patient who is had previous left lower lobectomy. No change or worsening finding. No visible bone abnormality. IMPRESSION: No active disease. Chronic pleural density at the left base in this patient who is had previous left lower lobectomy. Electronically Signed   By: Paulina Fusi M.D.   On: 09/07/2020 13:49   MR Brain W Wo Contrast  Result Date: 08/26/2020 CLINICAL DATA:  Staging of newly diagnosed lung carcinoma. EXAM: MRI HEAD WITHOUT AND WITH CONTRAST TECHNIQUE: Multiplanar, multiecho pulse sequences of the brain and surrounding structures were obtained without and with intravenous contrast. CONTRAST:  18mL GADAVIST GADOBUTROL 1 MMOL/ML IV SOLN COMPARISON:  None. FINDINGS: Brain: No acute infarct, mass effect or extra-axial collection. No acute or chronic hemorrhage. There is multifocal hyperintense T2-weighted signal within the white matter. Parenchymal volume and CSF spaces are normal. The midline structures are normal. There is no abnormal contrast enhancement. Vascular: Major flow voids are preserved. Skull and upper cervical spine: Normal calvarium and skull base. Visualized upper cervical spine and soft tissues are normal. Sinuses/Orbits:No paranasal sinus fluid levels or advanced mucosal thickening. No mastoid or middle ear effusion. Normal orbits. IMPRESSION: 1. No evidence of intracranial metastatic disease. 2. Multifocal hyperintense T2-weighted signal within the white matter, most consistent with chronic microvascular ischemia. Electronically  Signed   By: Deatra Robinson M.D.   On: 08/26/2020 00:51   IR IMAGING GUIDED PORT INSERTION  Result Date: 08/20/2020 INDICATION: History of metastatic lung cancer. In need of durable intravenous access for chemotherapy administration. EXAM: IMPLANTED PORT A CATH PLACEMENT WITH ULTRASOUND AND FLUOROSCOPIC GUIDANCE COMPARISON:  PET-CT-05/06/2020 MEDICATIONS: None ANESTHESIA/SEDATION: Moderate (conscious) sedation was employed during this procedure. A total of  Versed 1 mg and Fentanyl 50 mcg was administered intravenously. Moderate Sedation Time: 26 minutes. The patient's level of consciousness and vital signs were monitored continuously by radiology nursing throughout the procedure under my direct supervision. CONTRAST:  None FLUOROSCOPY TIME:  36 seconds (7 mGy) COMPLICATIONS: None immediate. PROCEDURE: The procedure, risks, benefits, and alternatives were explained to the patient. Questions regarding the procedure were encouraged and answered. The patient understands and consents to the procedure. The right neck and chest were prepped with chlorhexidine in a sterile fashion, and a sterile drape was applied covering the operative field. Maximum barrier sterile technique with sterile gowns and gloves were used for the procedure. A timeout was performed prior to the initiation of the procedure. Local anesthesia was provided with 1% lidocaine with epinephrine. After creating a small venotomy incision, a micropuncture kit was utilized to access the internal jugular vein. Real-time ultrasound guidance was utilized for vascular access including the acquisition of a permanent ultrasound image documenting patency of the accessed vessel. The microwire was utilized to measure appropriate catheter length. A subcutaneous port pocket was then created along the upper chest wall utilizing a combination of sharp and blunt dissection. The pocket was irrigated with sterile saline. A single lumen "standard sized" power injectable port  was chosen for placement. The 8 Fr catheter was tunneled from the port pocket site to the venotomy incision. The port was placed in the pocket. The external catheter was trimmed to appropriate length. At the venotomy, an 8 Fr peel-away sheath was placed over a guidewire under fluoroscopic guidance. The catheter was then placed through the sheath and the sheath was removed. Final catheter positioning was confirmed and documented with a fluoroscopic spot radiograph. The port was accessed with a Huber needle, aspirated and flushed with heparinized saline. The venotomy site was closed with an interrupted 4-0 Vicryl suture. The port pocket incision was closed with interrupted 2-0 Vicryl suture. The skin was opposed with a running subcuticular 4-0 Vicryl suture. Dermabond and Steri-strips were applied to both incisions. Dressings were applied. The patient tolerated the procedure well without immediate post procedural complication. FINDINGS: After catheter placement, the tip lies within the superior cavoatrial junction. The catheter aspirates and flushes normally and is ready for immediate use. IMPRESSION: Successful placement of a right internal jugular approach power injectable Port-A-Cath. The catheter is ready for immediate use. Electronically Signed   By: Sandi Mariscal M.D.   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  PLAN:    1. 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  Patient also noted to have a right iliac bony hypermetabolism as well as a soft tissue mass adjacent suspicious for isolated metastasis.  Soft tissue mass was biopsied on May 19, 2020 which was  negative for malignancy.  This lesion is still suspicious, so therefore we will continue to monitor closely. Patient underwent resection on July 12, 2020 which revealed 4 positive lymph nodes increasing her stage IIb.  MRI of the brain on August 25, 2020 for metastatic disease.  Plan to give adjuvant chemotherapy using cisplatin and pemetrexed every 3 weeks for 4 cycles.  Proceed with cycle 2 of 4 of adjuvant cisplatin  and pemetrexed today.  Return to clinic in 3 weeks for further evaluation and consideration of cycle 3.   2.  Anemia: Chronic and unchanged.  Patient's hemoglobin is 10.4 today. 3.  Leukopenia: Resolved. 4.  Thrombocytosis: Likely reactive, monitor.   Patient expressed understanding and was in agreement with this plan. She also understands that She can call clinic at any time with any questions, concerns, or complaints.   Cancer Staging Primary adenocarcinoma of lower lobe of left lung [facility]) Staging form: Lung, AJCC 8th Edition - Pathologic stage from 07/27/2020: Stage IIB (pT2b, pN1, cM0) - Signed by Lloyd Huger, MD on 07/27/2020   Lloyd Huger, MD   09/16/2020 9:41 AM

## 2020-09-12 ENCOUNTER — Other Ambulatory Visit: Payer: Self-pay | Admitting: Oncology

## 2020-09-16 ENCOUNTER — Other Ambulatory Visit: Payer: Self-pay

## 2020-09-16 ENCOUNTER — Inpatient Hospital Stay: Payer: Medicaid Other

## 2020-09-16 ENCOUNTER — Encounter: Payer: Self-pay | Admitting: Oncology

## 2020-09-16 ENCOUNTER — Inpatient Hospital Stay (HOSPITAL_BASED_OUTPATIENT_CLINIC_OR_DEPARTMENT_OTHER): Payer: Medicaid Other | Admitting: Oncology

## 2020-09-16 VITALS — BP 125/81 | HR 89 | Temp 98.1°F | Resp 20 | Wt 228.2 lb

## 2020-09-16 DIAGNOSIS — C3432 Malignant neoplasm of lower lobe, left bronchus or lung: Secondary | ICD-10-CM

## 2020-09-16 DIAGNOSIS — Z5111 Encounter for antineoplastic chemotherapy: Secondary | ICD-10-CM | POA: Diagnosis not present

## 2020-09-16 LAB — CBC WITH DIFFERENTIAL/PLATELET
Abs Immature Granulocytes: 0.2 10*3/uL — ABNORMAL HIGH (ref 0.00–0.07)
Basophils Absolute: 0 10*3/uL (ref 0.0–0.1)
Basophils Relative: 1 %
Eosinophils Absolute: 0.1 10*3/uL (ref 0.0–0.5)
Eosinophils Relative: 1 %
HCT: 32.6 % — ABNORMAL LOW (ref 36.0–46.0)
Hemoglobin: 10.4 g/dL — ABNORMAL LOW (ref 12.0–15.0)
Immature Granulocytes: 3 %
Lymphocytes Relative: 27 %
Lymphs Abs: 2.2 10*3/uL (ref 0.7–4.0)
MCH: 28 pg (ref 26.0–34.0)
MCHC: 31.9 g/dL (ref 30.0–36.0)
MCV: 87.9 fL (ref 80.0–100.0)
Monocytes Absolute: 0.5 10*3/uL (ref 0.1–1.0)
Monocytes Relative: 6 %
Neutro Abs: 5.2 10*3/uL (ref 1.7–7.7)
Neutrophils Relative %: 62 %
Platelets: 434 10*3/uL — ABNORMAL HIGH (ref 150–400)
RBC: 3.71 MIL/uL — ABNORMAL LOW (ref 3.87–5.11)
RDW: 17.2 % — ABNORMAL HIGH (ref 11.5–15.5)
WBC: 8.2 10*3/uL (ref 4.0–10.5)
nRBC: 0 % (ref 0.0–0.2)

## 2020-09-16 LAB — COMPREHENSIVE METABOLIC PANEL
ALT: 26 U/L (ref 0–44)
AST: 23 U/L (ref 15–41)
Albumin: 3.7 g/dL (ref 3.5–5.0)
Alkaline Phosphatase: 69 U/L (ref 38–126)
Anion gap: 9 (ref 5–15)
BUN: 10 mg/dL (ref 8–23)
CO2: 25 mmol/L (ref 22–32)
Calcium: 8.9 mg/dL (ref 8.9–10.3)
Chloride: 104 mmol/L (ref 98–111)
Creatinine, Ser: 0.74 mg/dL (ref 0.44–1.00)
GFR, Estimated: >60 mL/min (ref >60)
Glucose, Bld: 196 mg/dL — ABNORMAL HIGH (ref 70–99)
Potassium: 3.5 mmol/L (ref 3.5–5.1)
Sodium: 138 mmol/L (ref 135–145)
Total Bilirubin: 0.5 mg/dL (ref 0.3–1.2)
Total Protein: 7.2 g/dL (ref 6.5–8.1)

## 2020-09-16 LAB — MAGNESIUM: Magnesium: 1.8 mg/dL (ref 1.7–2.4)

## 2020-09-16 MED ORDER — CYANOCOBALAMIN 1000 MCG/ML IJ SOLN
1000.0000 ug | Freq: Once | INTRAMUSCULAR | Status: AC
  Administered 2020-09-16: 1000 ug via INTRAMUSCULAR
  Filled 2020-09-16: qty 1

## 2020-09-16 MED ORDER — SODIUM CHLORIDE 0.9% FLUSH
10.0000 mL | INTRAVENOUS | Status: DC | PRN
  Administered 2020-09-16: 10 mL via INTRAVENOUS
  Filled 2020-09-16: qty 10

## 2020-09-16 MED ORDER — MAGNESIUM SULFATE 2 GM/50ML IV SOLN
2.0000 g | Freq: Once | INTRAVENOUS | Status: AC
  Administered 2020-09-16: 2 g via INTRAVENOUS
  Filled 2020-09-16: qty 50

## 2020-09-16 MED ORDER — SODIUM CHLORIDE 0.9 % IV SOLN
1000.0000 mg | Freq: Once | INTRAVENOUS | Status: AC
  Administered 2020-09-16: 1000 mg via INTRAVENOUS
  Filled 2020-09-16: qty 40

## 2020-09-16 MED ORDER — POTASSIUM CHLORIDE IN NACL 20-0.9 MEQ/L-% IV SOLN
Freq: Once | INTRAVENOUS | Status: AC
  Filled 2020-09-16: qty 1000

## 2020-09-16 MED ORDER — HEPARIN SOD (PORK) LOCK FLUSH 100 UNIT/ML IV SOLN
INTRAVENOUS | Status: AC
  Filled 2020-09-16: qty 5

## 2020-09-16 MED ORDER — HEPARIN SOD (PORK) LOCK FLUSH 100 UNIT/ML IV SOLN
500.0000 [IU] | Freq: Once | INTRAVENOUS | Status: AC | PRN
  Administered 2020-09-16: 500 [IU]
  Filled 2020-09-16: qty 5

## 2020-09-16 MED ORDER — SODIUM CHLORIDE 0.9 % IV SOLN
150.0000 mg | Freq: Once | INTRAVENOUS | Status: AC
  Administered 2020-09-16: 150 mg via INTRAVENOUS
  Filled 2020-09-16: qty 150

## 2020-09-16 MED ORDER — SODIUM CHLORIDE 0.9 % IV SOLN
10.0000 mg | Freq: Once | INTRAVENOUS | Status: AC
  Administered 2020-09-16: 10 mg via INTRAVENOUS
  Filled 2020-09-16: qty 10

## 2020-09-16 MED ORDER — SODIUM CHLORIDE 0.9% FLUSH
10.0000 mL | INTRAVENOUS | Status: DC | PRN
  Filled 2020-09-16: qty 10

## 2020-09-16 MED ORDER — SODIUM CHLORIDE 0.9 % IV SOLN
75.0000 mg/m2 | Freq: Once | INTRAVENOUS | Status: AC
  Administered 2020-09-16: 162 mg via INTRAVENOUS
  Filled 2020-09-16: qty 162

## 2020-09-16 MED ORDER — SODIUM CHLORIDE 0.9 % IV SOLN
Freq: Once | INTRAVENOUS | Status: AC
  Filled 2020-09-16: qty 250

## 2020-09-16 MED ORDER — HEPARIN SOD (PORK) LOCK FLUSH 100 UNIT/ML IV SOLN
500.0000 [IU] | Freq: Once | INTRAVENOUS | Status: DC
  Filled 2020-09-16: qty 5

## 2020-09-16 MED ORDER — PALONOSETRON HCL INJECTION 0.25 MG/5ML
0.2500 mg | Freq: Once | INTRAVENOUS | Status: AC
Start: 2020-09-16 — End: 2020-09-16
  Administered 2020-09-16: 0.25 mg via INTRAVENOUS
  Filled 2020-09-16: qty 5

## 2020-09-16 NOTE — Progress Notes (Signed)
Patient here for treatment check, she has no concerns today.

## 2020-10-03 NOTE — Progress Notes (Signed)
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   INTERVAL HISTORY: Patient returns to clinic today for further evaluation and consideration of cycle 3 of cisplatin and pemetrexed.  She continues to tolerate her treatments well without significant side effects.  She currently feels well and is asymptomatic. She has no neurologic complaints.  She denies any recent fevers or illnesses.  She has a good appetite and denies weight loss.  She has no chest pain, shortness of breath, cough, or hemoptysis.  She denies any nausea, vomiting, constipation, or diarrhea.  She has no urinary complaints.  Patient offers no specific complaints today.  REVIEW OF SYSTEMS:   Review of Systems  Constitutional: Negative.  Negative for fever, malaise/fatigue and weight loss.  Respiratory: Negative.  Negative for cough, hemoptysis and shortness of breath.   Cardiovascular: Negative.  Negative for chest pain and leg swelling.  Gastrointestinal: Negative.  Negative for abdominal pain.  Genitourinary: Negative.  Negative for dysuria and flank pain.  Musculoskeletal: Negative.  Negative for back pain and joint pain.  Skin: Negative.  Negative for rash.  Neurological: Negative.  Negative for dizziness, focal weakness, weakness and headaches.  Psychiatric/Behavioral: Negative.  The patient is not nervous/anxious.     As per HPI. Otherwise, a complete review of systems is negative.  PAST MEDICAL HISTORY: Past Medical History:  Diagnosis Date  . Asthma   . COPD (chronic obstructive pulmonary disease) ([place])   . CVA (cerebral vascular accident) ([place]) 2014  . Depression   . Diabetes mellitus ([place])   .  Diabetes mellitus without complication ([place])   . Hepatitis C   . History of kidney stones   . Hyperlipidemia   . Iron deficiency anemia   . Obesity   . OSA on CPAP    Mild, noncompliant with CPAP said cpap machine was recalled, is waiting for a replacement   . Paranoia ([place])   . Schizoaffective disorder, bipolar type ([place])     PAST SURGICAL HISTORY: Past Surgical History:  Procedure Laterality Date  . BREAST CYST EXCISION Bilateral 2013   Benign  . CESAREAN SECTION    . CHOLECYSTECTOMY    . COLONOSCOPY    . ESOPHAGOGASTRODUODENOSCOPY  2020   done in [place]  . INGUINAL HERNIA REPAIR Left 2002  . INTERCOSTAL NERVE BLOCK N/A 07/12/2020   Procedure: INTERCOSTAL NERVE BLOCK;  Surgeon: Melrose Nakayama, MD;  Location: [place];  Service: Thoracic;  Laterality: N/A;  . IR IMAGING GUIDED PORT INSERTION  08/20/2020  . LYMPH NODE DISSECTION Left 07/12/2020   Procedure: LYMPH NODE DISSECTION;  Surgeon: Melrose Nakayama, MD;  Location: [place];  Service: Thoracic;  Laterality: Left;  . UMBILICAL HERNIA REPAIR    . VIDEO BRONCHOSCOPY WITH ENDOBRONCHIAL NAVIGATION N/A 06/07/2020   Procedure: VIDEO BRONCHOSCOPY WITH ENDOBRONCHIAL NAVIGATION;  Surgeon: Tyler Pita, MD;  Location: [facility];  Service: Pulmonary;  Laterality: N/A;    FAMILY HISTORY: History reviewed. No pertinent family history.  ADVANCED DIRECTIVES (Y/N):  N  HEALTH MAINTENANCE: Social History   Tobacco Use  . Smoking status: Former Smoker    Packs/day: 2.00    Years: 38.00    Pack years: 76.00    Types: Cigarettes    Quit  date: 2012    Years since quitting: 10.3  . Smokeless tobacco: Former Network engineer  . Vaping Use: Never used  Substance Use Topics  . Alcohol use: Not Currently    Comment: occassionaly  . Drug use: Not Currently     Colonoscopy:  PAP:  Bone density:  Lipid panel:  No Known Allergies  Current Outpatient Medications  Medication Sig Dispense Refill  . acetaminophen (TYLENOL)  650 MG CR tablet Take 650 mg by mouth 2 (two) times daily.    Marland Kitchen albuterol (VENTOLIN HFA) 108 (90 Base) MCG/ACT inhaler Inhale 2 puffs into the lungs every 6 (six) hours as needed for wheezing or shortness of breath.    Marland Kitchen atorvastatin (LIPITOR) 40 MG tablet Take 40 mg by mouth at bedtime.    . benztropine (COGENTIN) 1 MG tablet Take 1 mg by mouth at bedtime.    . citalopram (CELEXA) 20 MG tablet Take 20 mg by mouth every morning.    . Dulaglutide (TRULICITY) 1.5 NL/8.9QJ SOPN Inject 1.5 mg into the skin every Friday.    . Fluticasone-Umeclidin-Vilant (TRELEGY ELLIPTA) 100-62.5-25 MCG/INH AEPB Inhale 1 puff into the lungs daily.    . folic acid (FOLVITE) 1 MG tablet Take 1 tablet (1 mg total) by mouth daily. Start 5-7 days before Alimta chemotherapy. Continue until 21 days after Alimta completed. 100 tablet 3  . gabapentin (NEURONTIN) 300 MG capsule Take 300 mg by mouth 2 (two) times daily.    . insulin detemir (LEVEMIR) 100 UNIT/ML injection Inject 30 Units into the skin daily.    Marland Kitchen latanoprost (XALATAN) 0.005 % ophthalmic solution Place 1 drop into both eyes daily.    Marland Kitchen lidocaine (LIDODERM) 5 % Place 2 patches onto the skin See admin instructions. Apply 1 patch to each foot every 12 hours    . lidocaine-prilocaine (EMLA) cream Apply to affected area once 30 g 3  . LORazepam (ATIVAN) 1 MG tablet Take 1 mg by mouth 2 (two) times daily.    Marland Kitchen LYRICA 75 MG capsule Take 1 capsule by mouth twice a day  for nerve pain    . ondansetron (ZOFRAN) 8 MG tablet Take 1 tablet (8 mg total) by mouth 2 (two) times daily as needed (Nausea or vomiting). Start if needed on the third day after cisplatin. 60 tablet 1  . prochlorperazine (COMPAZINE) 10 MG tablet Take 1 tablet (10 mg total) by mouth every 6 (six) hours as needed (Nausea or vomiting). 60 tablet 1  . traMADol (ULTRAM) 50 MG tablet Take 50 mg by mouth every 6 (six) hours as needed.    . ziprasidone (GEODON) 80 MG capsule Take 80 mg by mouth at bedtime.      No current facility-administered medications for this visit.    OBJECTIVE: Vitals:   10/07/20 0927  BP: 116/63  Pulse: 95  Resp: 18  Temp: 98.1 F (36.7 C)  SpO2: 96%     Body mass index is 42.98 kg/m.    ECOG FS:0 - Asymptomatic  General: Well-developed, well-nourished, no acute distress.  Sitting in a wheelchair. Eyes: Pink conjunctiva, anicteric sclera. HEENT: Normocephalic, moist mucous membranes. Lungs: No audible wheezing or coughing. Heart: Regular rate and rhythm. Abdomen: Soft, nontender, no obvious distention. Musculoskeletal: No edema, cyanosis, or clubbing. Neuro: Alert, answering all questions appropriately. Cranial nerves grossly intact. Skin: No rashes or petechiae noted. Psych: Normal affect.   LAB RESULTS:  Lab Results  Component Value Date   NA 135 10/07/2020  K 3.8 10/07/2020   CL 99 10/07/2020   CO2 24 10/07/2020   GLUCOSE 182 (H) 10/07/2020   BUN 12 10/07/2020   CREATININE 0.93 10/07/2020   CALCIUM 8.7 (L) 10/07/2020   PROT 6.9 10/07/2020   ALBUMIN 3.8 10/07/2020   AST 34 10/07/2020   ALT 29 10/07/2020   ALKPHOS 66 10/07/2020   BILITOT 0.6 10/07/2020   GFRNONAA >60 10/07/2020   GFRAA >60 01/30/2020    Lab Results  Component Value Date   WBC 5.7 10/07/2020   NEUTROABS 2.8 10/07/2020   HGB 9.9 (L) 10/07/2020   HCT 31.3 (L) 10/07/2020   MCV 89.4 10/07/2020   PLT 232 10/07/2020     STUDIES: No results found.  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  PLAN:    1. 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  Patient also noted to have a right iliac bony hypermetabolism as well as a soft tissue mass adjacent  suspicious for isolated metastasis.  Soft tissue mass was biopsied on May 19, 2020 which was negative for malignancy.  This lesion is still suspicious, so therefore we will continue to monitor closely. Patient underwent resection on July 12, 2020 which revealed 4 positive lymph nodes increasing her stage IIb.  MRI of the brain on August 25, 2020 for metastatic disease.  Plan to give adjuvant chemotherapy using cisplatin and pemetrexed every 3 weeks for 4 cycles.  Proceed with cycle 3 of 4 of adjuvant cisplatin and pemetrexed today.  Return to clinic in 3 weeks for further evaluation and consideration of cycle 4. 2.  Anemia: Patient's hemoglobin has trended down slightly to 9.9, monitor. 3.  Leukopenia: Resolved. 4.  Thrombocytosis: Resolved.   Patient expressed understanding and was in agreement with this plan. She also understands that She can call clinic at any time with any questions, concerns, or complaints.   Cancer Staging Primary adenocarcinoma of lower lobe of left lung [facility]) Staging form: Lung, AJCC 8th Edition - Pathologic stage from 07/27/2020: Stage IIB (pT2b, pN1, cM0) - Signed by Lloyd Huger, MD on 07/27/2020   Lloyd Huger, MD   10/08/2020 12:15 PM

## 2020-10-07 ENCOUNTER — Inpatient Hospital Stay: Payer: Medicaid Other

## 2020-10-07 ENCOUNTER — Encounter: Payer: Self-pay | Admitting: Oncology

## 2020-10-07 ENCOUNTER — Inpatient Hospital Stay (HOSPITAL_BASED_OUTPATIENT_CLINIC_OR_DEPARTMENT_OTHER): Payer: Medicaid Other | Admitting: Oncology

## 2020-10-07 ENCOUNTER — Inpatient Hospital Stay: Payer: Medicaid Other | Attending: Oncology

## 2020-10-07 VITALS — BP 116/63 | HR 95 | Temp 98.1°F | Resp 18 | Wt 235.0 lb

## 2020-10-07 DIAGNOSIS — C3432 Malignant neoplasm of lower lobe, left bronchus or lung: Secondary | ICD-10-CM | POA: Insufficient documentation

## 2020-10-07 DIAGNOSIS — Z5111 Encounter for antineoplastic chemotherapy: Secondary | ICD-10-CM | POA: Diagnosis not present

## 2020-10-07 DIAGNOSIS — Z95828 Presence of other vascular implants and grafts: Secondary | ICD-10-CM

## 2020-10-07 DIAGNOSIS — Z79899 Other long term (current) drug therapy: Secondary | ICD-10-CM | POA: Insufficient documentation

## 2020-10-07 LAB — CBC WITH DIFFERENTIAL/PLATELET
Abs Immature Granulocytes: 0.06 10*3/uL (ref 0.00–0.07)
Basophils Absolute: 0 10*3/uL (ref 0.0–0.1)
Basophils Relative: 0 %
Eosinophils Absolute: 0.1 10*3/uL (ref 0.0–0.5)
Eosinophils Relative: 1 %
HCT: 31.3 % — ABNORMAL LOW (ref 36.0–46.0)
Hemoglobin: 9.9 g/dL — ABNORMAL LOW (ref 12.0–15.0)
Immature Granulocytes: 1 %
Lymphocytes Relative: 40 %
Lymphs Abs: 2.3 10*3/uL (ref 0.7–4.0)
MCH: 28.3 pg (ref 26.0–34.0)
MCHC: 31.6 g/dL (ref 30.0–36.0)
MCV: 89.4 fL (ref 80.0–100.0)
Monocytes Absolute: 0.4 10*3/uL (ref 0.1–1.0)
Monocytes Relative: 8 %
Neutro Abs: 2.8 10*3/uL (ref 1.7–7.7)
Neutrophils Relative %: 50 %
Platelets: 232 10*3/uL (ref 150–400)
RBC: 3.5 MIL/uL — ABNORMAL LOW (ref 3.87–5.11)
RDW: 21.2 % — ABNORMAL HIGH (ref 11.5–15.5)
WBC: 5.7 10*3/uL (ref 4.0–10.5)
nRBC: 0 % (ref 0.0–0.2)

## 2020-10-07 LAB — COMPREHENSIVE METABOLIC PANEL WITH GFR
ALT: 29 U/L (ref 0–44)
AST: 34 U/L (ref 15–41)
Albumin: 3.8 g/dL (ref 3.5–5.0)
Alkaline Phosphatase: 66 U/L (ref 38–126)
Anion gap: 12 (ref 5–15)
BUN: 12 mg/dL (ref 8–23)
CO2: 24 mmol/L (ref 22–32)
Calcium: 8.7 mg/dL — ABNORMAL LOW (ref 8.9–10.3)
Chloride: 99 mmol/L (ref 98–111)
Creatinine, Ser: 0.93 mg/dL (ref 0.44–1.00)
GFR, Estimated: >60 mL/min
Glucose, Bld: 182 mg/dL — ABNORMAL HIGH (ref 70–99)
Potassium: 3.8 mmol/L (ref 3.5–5.1)
Sodium: 135 mmol/L (ref 135–145)
Total Bilirubin: 0.6 mg/dL (ref 0.3–1.2)
Total Protein: 6.9 g/dL (ref 6.5–8.1)

## 2020-10-07 LAB — MAGNESIUM: Magnesium: 1.8 mg/dL (ref 1.7–2.4)

## 2020-10-07 MED ORDER — MAGNESIUM SULFATE 2 GM/50ML IV SOLN
2.0000 g | Freq: Once | INTRAVENOUS | Status: AC
  Administered 2020-10-07: 2 g via INTRAVENOUS
  Filled 2020-10-07: qty 50

## 2020-10-07 MED ORDER — SODIUM CHLORIDE 0.9 % IV SOLN
Freq: Once | INTRAVENOUS | Status: AC
Start: 2020-10-07 — End: 2020-10-07
  Filled 2020-10-07: qty 250

## 2020-10-07 MED ORDER — DEXAMETHASONE SODIUM PHOSPHATE 100 MG/10ML IJ SOLN
10.0000 mg | Freq: Once | INTRAMUSCULAR | Status: AC
Start: 2020-10-07 — End: 2020-10-07
  Administered 2020-10-07: 10 mg via INTRAVENOUS
  Filled 2020-10-07: qty 1

## 2020-10-07 MED ORDER — HEPARIN SOD (PORK) LOCK FLUSH 100 UNIT/ML IV SOLN
500.0000 [IU] | Freq: Once | INTRAVENOUS | Status: AC
  Administered 2020-10-07: 500 [IU] via INTRAVENOUS
  Filled 2020-10-07: qty 5

## 2020-10-07 MED ORDER — CYANOCOBALAMIN 1000 MCG/ML IJ SOLN
1000.0000 ug | Freq: Once | INTRAMUSCULAR | Status: AC
  Administered 2020-10-07: 1000 ug via INTRAMUSCULAR
  Filled 2020-10-07: qty 1

## 2020-10-07 MED ORDER — PALONOSETRON HCL INJECTION 0.25 MG/5ML
0.2500 mg | Freq: Once | INTRAVENOUS | Status: AC
  Administered 2020-10-07: 0.25 mg via INTRAVENOUS
  Filled 2020-10-07: qty 5

## 2020-10-07 MED ORDER — SODIUM CHLORIDE 0.9 % IV SOLN
75.0000 mg/m2 | Freq: Once | INTRAVENOUS | Status: AC
  Administered 2020-10-07: 162 mg via INTRAVENOUS
  Filled 2020-10-07: qty 162

## 2020-10-07 MED ORDER — SODIUM CHLORIDE 0.9 % IV SOLN
463.0000 mg/m2 | Freq: Once | INTRAVENOUS | Status: AC
  Administered 2020-10-07: 1000 mg via INTRAVENOUS
  Filled 2020-10-07: qty 40

## 2020-10-07 MED ORDER — SODIUM CHLORIDE 0.9% FLUSH
10.0000 mL | Freq: Once | INTRAVENOUS | Status: AC
  Administered 2020-10-07: 10 mL via INTRAVENOUS
  Filled 2020-10-07: qty 10

## 2020-10-07 MED ORDER — HEPARIN SOD (PORK) LOCK FLUSH 100 UNIT/ML IV SOLN
INTRAVENOUS | Status: AC
  Filled 2020-10-07: qty 5

## 2020-10-07 MED ORDER — SODIUM CHLORIDE 0.9 % IV SOLN
150.0000 mg | Freq: Once | INTRAVENOUS | Status: AC
  Administered 2020-10-07: 150 mg via INTRAVENOUS
  Filled 2020-10-07: qty 5

## 2020-10-07 MED ORDER — POTASSIUM CHLORIDE IN NACL 20-0.9 MEQ/L-% IV SOLN
Freq: Once | INTRAVENOUS | Status: AC
Start: 2020-10-07 — End: 2020-10-07
  Filled 2020-10-07: qty 1000

## 2020-10-07 MED ORDER — HEPARIN SOD (PORK) LOCK FLUSH 100 UNIT/ML IV SOLN
500.0000 [IU] | Freq: Once | INTRAVENOUS | Status: DC | PRN
  Filled 2020-10-07: qty 5

## 2020-10-07 NOTE — Progress Notes (Signed)
Pt in for follow up, weight down 3 lbs, states nibbling more instead of eating big meals.  States having nausea at times but relieved with medication.

## 2020-10-18 ENCOUNTER — Encounter: Payer: Self-pay | Admitting: Family

## 2020-10-18 ENCOUNTER — Telehealth: Payer: Self-pay | Admitting: *Deleted

## 2020-10-18 NOTE — Telephone Encounter (Signed)
Georga Kaufmann, NP from [facility] called stating that patient had her Physical and that her hgb is down form 12 to 10.6. Her B 12 and Iron levels are normal and she is asking if this is just coming from the chemotherapy so that she can document it. She also states that she has faxed the labs to you

## 2020-10-18 NOTE — Telephone Encounter (Signed)
Called NP Kepa back at [facility]  and advised per Dr  Grayland Ormond that it was likely from chemo . She understood and said she would make note in her chart

## 2020-10-22 NOTE — Progress Notes (Signed)
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   INTERVAL HISTORY: Patient returns to clinic today for further evaluation and consideration of cycle 4 of 4 of cisplatin and pemetrexed.  She currently feels well and is asymptomatic.  She is tolerating her treatments without significant side effects. She has Natalie [name] neurologic complaints.  She denies any recent fevers or illnesses.  She has a good appetite and denies weight loss.  She has Natalie [name] chest pain, shortness of breath, cough, or hemoptysis.  She denies any nausea, vomiting, constipation, or diarrhea.  She has Natalie [name] urinary complaints.  Patient offers Natalie [name] specific complaints today.  REVIEW OF SYSTEMS:   Review of Systems  Constitutional: Negative.  Negative for fever, malaise/fatigue and weight loss.  Respiratory: Negative.  Negative for cough, hemoptysis and shortness of breath.   Cardiovascular: Negative.  Negative for chest pain and leg swelling.  Gastrointestinal: Negative.  Negative for abdominal pain.  Genitourinary: Negative.  Negative for dysuria and flank pain.  Musculoskeletal: Negative.  Negative for back pain and joint pain.  Skin: Negative.  Negative for rash.  Neurological: Negative.  Negative for dizziness, focal weakness, weakness and headaches.  Psychiatric/Behavioral: Negative.  The patient is not nervous/anxious.     As per HPI. Otherwise, a complete review of systems is negative.  PAST MEDICAL HISTORY: Past Medical History:  Diagnosis Date  . Asthma   . COPD (chronic obstructive pulmonary disease) ([place])   . CVA (cerebral vascular accident) ([place]) 2014  . Depression   . Diabetes mellitus ([place])   . Diabetes  mellitus without complication ([place])   . Hepatitis C   . History of kidney stones   . Hyperlipidemia   . Iron deficiency anemia   . Obesity   . OSA on CPAP    Mild, noncompliant with CPAP said cpap machine was recalled, is waiting for a replacement   . Paranoia ([place])   . Schizoaffective disorder, bipolar type ([place])     PAST SURGICAL HISTORY: Past Surgical History:  Procedure Laterality Date  . BREAST CYST EXCISION Bilateral 2013   Benign  . CESAREAN SECTION    . CHOLECYSTECTOMY    . COLONOSCOPY    . ESOPHAGOGASTRODUODENOSCOPY  2020   done in [place]  . INGUINAL HERNIA REPAIR Left 2002  . INTERCOSTAL NERVE BLOCK N/A 07/12/2020   Procedure: INTERCOSTAL NERVE BLOCK;  Surgeon: Melrose Nakayama, MD;  Location: [place];  Service: Thoracic;  Laterality: N/A;  . IR IMAGING GUIDED PORT INSERTION  08/20/2020  . LYMPH NODE DISSECTION Left 07/12/2020   Procedure: LYMPH NODE DISSECTION;  Surgeon: Melrose Nakayama, MD;  Location: [place];  Service: Thoracic;  Laterality: Left;  . UMBILICAL HERNIA REPAIR    . VIDEO BRONCHOSCOPY WITH ENDOBRONCHIAL NAVIGATION N/A 06/07/2020   Procedure: VIDEO BRONCHOSCOPY WITH ENDOBRONCHIAL NAVIGATION;  Surgeon: Tyler Pita, MD;  Location: [facility];  Service: Pulmonary;  Laterality: N/A;    FAMILY HISTORY: Natalie [name] family history on file.  ADVANCED DIRECTIVES (Y/N):  N  HEALTH MAINTENANCE: Social History   Tobacco Use  . Smoking status: Former Smoker    Packs/day: 2.00    Years: 38.00    Pack years: 76.00    Types: Cigarettes    Quit date:  2012    Years since quitting: 10.3  . Smokeless tobacco: Former Network engineer  . Vaping Use: Never used  Substance Use Topics  . Alcohol use: Not Currently    Comment: occassionaly  . Drug use: Not Currently     Colonoscopy:  PAP:  Bone density:  Lipid panel:  Natalie [name] Known Allergies  Current Outpatient Medications  Medication Sig Dispense Refill  . acetaminophen (TYLENOL) 650 MG CR tablet Take 650 mg  by mouth 2 (two) times daily.    Marland Kitchen albuterol (VENTOLIN HFA) 108 (90 Base) MCG/ACT inhaler Inhale 2 puffs into the lungs every 6 (six) hours as needed for wheezing or shortness of breath.    Marland Kitchen atorvastatin (LIPITOR) 40 MG tablet Take 40 mg by mouth at bedtime.    . benztropine (COGENTIN) 1 MG tablet Take 1 mg by mouth at bedtime.    . citalopram (CELEXA) 20 MG tablet Take 20 mg by mouth every morning.    . Dulaglutide (TRULICITY) 1.5 UT/6.5YY SOPN Inject 1.5 mg into the skin every Friday.    . Fluticasone-Umeclidin-Vilant (TRELEGY ELLIPTA) 100-62.5-25 MCG/INH AEPB Inhale 1 puff into the lungs daily.    . folic acid (FOLVITE) 1 MG tablet Take 1 tablet (1 mg total) by mouth daily. Start 5-7 days before Alimta chemotherapy. Continue until 21 days after Alimta completed. 100 tablet 3  . gabapentin (NEURONTIN) 300 MG capsule Take 300 mg by mouth 2 (two) times daily.    . insulin detemir (LEVEMIR) 100 UNIT/ML injection Inject 30 Units into the skin daily.    Marland Kitchen latanoprost (XALATAN) 0.005 % ophthalmic solution Place 1 drop into both eyes daily.    Marland Kitchen lidocaine (LIDODERM) 5 % Place 2 patches onto the skin See admin instructions. Apply 1 patch to each foot every 12 hours    . lidocaine-prilocaine (EMLA) cream Apply to affected area once 30 g 3  . LORazepam (ATIVAN) 1 MG tablet Take 1 mg by mouth 2 (two) times daily.    Marland Kitchen LYRICA 25 MG capsule Take 3 capsule by mouth twice a day  for nerve pain (for respite) STOP MED 4.5.22    . LYRICA 75 MG capsule Take 1 capsule by mouth twice a day  for nerve pain    . ondansetron (ZOFRAN) 8 MG tablet Take 1 tablet (8 mg total) by mouth 2 (two) times daily as needed (Nausea or vomiting). Start if needed on the third day after cisplatin. 60 tablet 1  . prochlorperazine (COMPAZINE) 10 MG tablet Take 1 tablet (10 mg total) by mouth every 6 (six) hours as needed (Nausea or vomiting). 60 tablet 1  . SENNA PLUS 8.6-50 MG tablet Take 1 tablet by mouth at bedtime.    . traMADol  (ULTRAM) 50 MG tablet Take 50 mg by mouth every 6 (six) hours as needed.    . ziprasidone (GEODON) 80 MG capsule Take 80 mg by mouth at bedtime.     Natalie [name] current facility-administered medications for this visit.    OBJECTIVE: Vitals:   10/28/20 0921  BP: 126/60  Pulse: (!) 104  Temp: 98.2 F (36.8 C)  SpO2: 97%     Body mass index is 42.38 kg/m.    ECOG FS:0 - Asymptomatic  General: Well-developed, well-nourished, Natalie [name] acute distress.  Sitting in a wheelchair. Eyes: Pink conjunctiva, anicteric sclera. HEENT: Normocephalic, moist mucous membranes. Lungs: Natalie [name] audible wheezing or coughing. Heart: Regular rate and rhythm. Abdomen: Soft, nontender, Natalie [name] obvious distention. Musculoskeletal: Natalie [name] edema, cyanosis, or  clubbing. Neuro: Alert, answering all questions appropriately. Cranial nerves grossly intact. Skin: Natalie [name] rashes or petechiae noted. Psych: Normal affect.   LAB RESULTS:  Lab Results  Component Value Date   NA 140 10/28/2020   K 4.3 10/28/2020   CL 103 10/28/2020   CO2 24 10/28/2020   GLUCOSE 206 (H) 10/28/2020   BUN 14 10/28/2020   CREATININE 0.95 10/28/2020   CALCIUM 9.1 10/28/2020   PROT 7.3 10/28/2020   ALBUMIN 3.9 10/28/2020   AST 26 10/28/2020   ALT 19 10/28/2020   ALKPHOS 78 10/28/2020   BILITOT 0.6 10/28/2020   GFRNONAA >60 10/28/2020   GFRAA >60 01/30/2020    Lab Results  Component Value Date   WBC 4.5 10/28/2020   NEUTROABS 2.4 10/28/2020   HGB 9.6 (L) 10/28/2020   HCT 29.6 (L) 10/28/2020   MCV 88.6 10/28/2020   PLT 261 10/28/2020     STUDIES: Natalie [name] results found.  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  PLAN:    1. 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  Patient also noted to have a right iliac bony hypermetabolism as well as a soft tissue mass adjacent suspicious for isolated metastasis.  Soft tissue mass was biopsied on May 19, 2020 which was negative for malignancy.  This lesion is still suspicious, so therefore we will continue to monitor closely. Patient underwent resection on July 12, 2020 which revealed 4 positive lymph nodes increasing her stage IIb.  MRI of the brain on August 25, 2020 for metastatic disease.  Plan to give adjuvant chemotherapy using cisplatin and pemetrexed every 3 weeks for 4 cycles.  Proceed with cycle 4 of 4 of adjuvant cisplatin and pemetrexed today.  Natalie [name] further treatments are needed at this time.  Return to clinic in 3 months with repeat PET scan and further evaluation.   2.  Anemia: Chronic and unchanged.  Patient's hemoglobin is 9.6 today. 3.  Hyperglycemia: Continue follow-up and evaluation per primary care.   Patient expressed understanding and was in agreement with this plan. She also understands that She can call clinic at any time with any questions, concerns, or complaints.   Cancer Staging Primary adenocarcinoma of lower lobe of left lung [facility]) Staging form: Lung, AJCC 8th Edition - Pathologic stage from 07/27/2020: Stage IIB (pT2b, pN1, cM0) - Signed by Lloyd Huger, MD on 07/27/2020   Lloyd Huger, MD   10/28/2020 9:50 AM

## 2020-10-28 ENCOUNTER — Inpatient Hospital Stay: Payer: Medicaid Other

## 2020-10-28 ENCOUNTER — Inpatient Hospital Stay (HOSPITAL_BASED_OUTPATIENT_CLINIC_OR_DEPARTMENT_OTHER): Payer: Medicaid Other | Admitting: Oncology

## 2020-10-28 ENCOUNTER — Other Ambulatory Visit: Payer: Self-pay

## 2020-10-28 ENCOUNTER — Inpatient Hospital Stay: Payer: Medicaid Other | Attending: Oncology

## 2020-10-28 ENCOUNTER — Encounter: Payer: Self-pay | Admitting: Oncology

## 2020-10-28 VITALS — BP 126/60 | HR 104 | Temp 98.2°F | Ht 62.0 in | Wt 231.7 lb

## 2020-10-28 DIAGNOSIS — C7931 Secondary malignant neoplasm of brain: Secondary | ICD-10-CM | POA: Insufficient documentation

## 2020-10-28 DIAGNOSIS — Z5111 Encounter for antineoplastic chemotherapy: Secondary | ICD-10-CM | POA: Diagnosis present

## 2020-10-28 DIAGNOSIS — C3432 Malignant neoplasm of lower lobe, left bronchus or lung: Secondary | ICD-10-CM

## 2020-10-28 DIAGNOSIS — Z79899 Other long term (current) drug therapy: Secondary | ICD-10-CM | POA: Insufficient documentation

## 2020-10-28 LAB — CBC WITH DIFFERENTIAL/PLATELET
Abs Immature Granulocytes: 0.07 10*3/uL (ref 0.00–0.07)
Basophils Absolute: 0 10*3/uL (ref 0.0–0.1)
Basophils Relative: 0 %
Eosinophils Absolute: 0 10*3/uL (ref 0.0–0.5)
Eosinophils Relative: 1 %
HCT: 29.6 % — ABNORMAL LOW (ref 36.0–46.0)
Hemoglobin: 9.6 g/dL — ABNORMAL LOW (ref 12.0–15.0)
Immature Granulocytes: 2 %
Lymphocytes Relative: 34 %
Lymphs Abs: 1.6 10*3/uL (ref 0.7–4.0)
MCH: 28.7 pg (ref 26.0–34.0)
MCHC: 32.4 g/dL (ref 30.0–36.0)
MCV: 88.6 fL (ref 80.0–100.0)
Monocytes Absolute: 0.4 10*3/uL (ref 0.1–1.0)
Monocytes Relative: 10 %
Neutro Abs: 2.4 10*3/uL (ref 1.7–7.7)
Neutrophils Relative %: 53 %
Platelets: 261 10*3/uL (ref 150–400)
RBC: 3.34 MIL/uL — ABNORMAL LOW (ref 3.87–5.11)
RDW: 25 % — ABNORMAL HIGH (ref 11.5–15.5)
WBC: 4.5 10*3/uL (ref 4.0–10.5)
nRBC: 0.4 % — ABNORMAL HIGH (ref 0.0–0.2)

## 2020-10-28 LAB — COMPREHENSIVE METABOLIC PANEL
ALT: 19 U/L (ref 0–44)
AST: 26 U/L (ref 15–41)
Albumin: 3.9 g/dL (ref 3.5–5.0)
Alkaline Phosphatase: 78 U/L (ref 38–126)
Anion gap: 13 (ref 5–15)
BUN: 14 mg/dL (ref 8–23)
CO2: 24 mmol/L (ref 22–32)
Calcium: 9.1 mg/dL (ref 8.9–10.3)
Chloride: 103 mmol/L (ref 98–111)
Creatinine, Ser: 0.95 mg/dL (ref 0.44–1.00)
GFR, Estimated: >60 mL/min (ref >60)
Glucose, Bld: 206 mg/dL — ABNORMAL HIGH (ref 70–99)
Potassium: 4.3 mmol/L (ref 3.5–5.1)
Sodium: 140 mmol/L (ref 135–145)
Total Bilirubin: 0.6 mg/dL (ref 0.3–1.2)
Total Protein: 7.3 g/dL (ref 6.5–8.1)

## 2020-10-28 LAB — MAGNESIUM: Magnesium: 1.8 mg/dL (ref 1.7–2.4)

## 2020-10-28 MED ORDER — HEPARIN SOD (PORK) LOCK FLUSH 100 UNIT/ML IV SOLN
500.0000 [IU] | Freq: Once | INTRAVENOUS | Status: AC | PRN
  Administered 2020-10-28: 500 [IU]
  Filled 2020-10-28: qty 5

## 2020-10-28 MED ORDER — PALONOSETRON HCL INJECTION 0.25 MG/5ML
0.2500 mg | Freq: Once | INTRAVENOUS | Status: AC
  Administered 2020-10-28: 0.25 mg via INTRAVENOUS
  Filled 2020-10-28: qty 5

## 2020-10-28 MED ORDER — POTASSIUM CHLORIDE IN NACL 20-0.9 MEQ/L-% IV SOLN
Freq: Once | INTRAVENOUS | Status: AC
Start: 2020-10-28 — End: 2020-10-28
  Filled 2020-10-28: qty 1000

## 2020-10-28 MED ORDER — SODIUM CHLORIDE 0.9 % IV SOLN
500.0000 mg/m2 | Freq: Once | INTRAVENOUS | Status: AC
  Administered 2020-10-28: 1100 mg via INTRAVENOUS
  Filled 2020-10-28: qty 40

## 2020-10-28 MED ORDER — SODIUM CHLORIDE 0.9 % IV SOLN
10.0000 mg | Freq: Once | INTRAVENOUS | Status: AC
  Administered 2020-10-28: 10 mg via INTRAVENOUS
  Filled 2020-10-28: qty 10

## 2020-10-28 MED ORDER — SODIUM CHLORIDE 0.9 % IV SOLN
75.0000 mg/m2 | Freq: Once | INTRAVENOUS | Status: AC
  Administered 2020-10-28: 162 mg via INTRAVENOUS
  Filled 2020-10-28: qty 162

## 2020-10-28 MED ORDER — SODIUM CHLORIDE 0.9 % IV SOLN
150.0000 mg | Freq: Once | INTRAVENOUS | Status: AC
  Administered 2020-10-28: 150 mg via INTRAVENOUS
  Filled 2020-10-28: qty 150

## 2020-10-28 MED ORDER — HEPARIN SOD (PORK) LOCK FLUSH 100 UNIT/ML IV SOLN
INTRAVENOUS | Status: AC
  Filled 2020-10-28: qty 5

## 2020-10-28 MED ORDER — MAGNESIUM SULFATE 2 GM/50ML IV SOLN
2.0000 g | Freq: Once | INTRAVENOUS | Status: AC
  Administered 2020-10-28: 2 g via INTRAVENOUS
  Filled 2020-10-28: qty 50

## 2020-10-28 MED ORDER — SODIUM CHLORIDE 0.9% FLUSH
10.0000 mL | Freq: Once | INTRAVENOUS | Status: AC
  Administered 2020-10-28: 10 mL via INTRAVENOUS
  Filled 2020-10-28: qty 10

## 2020-10-28 MED ORDER — CYANOCOBALAMIN 1000 MCG/ML IJ SOLN
1000.0000 ug | Freq: Once | INTRAMUSCULAR | Status: AC
  Administered 2020-10-28: 1000 ug via INTRAMUSCULAR
  Filled 2020-10-28: qty 1

## 2020-10-28 MED ORDER — SODIUM CHLORIDE 0.9 % IV SOLN
Freq: Once | INTRAVENOUS | Status: AC
  Filled 2020-10-28: qty 250

## 2020-10-28 NOTE — Patient Instructions (Signed)
[facility]   Discharge Instructions: Thank you for choosing [facility] to provide your oncology and hematology care.  If you have a lab appointment with the [facility], please go directly to the [facility] and check in at the registration area.  Wear comfortable clothing and clothing appropriate for easy access to any Portacath or PICC line.   We strive to give you quality time with your provider. You may need to reschedule your appointment if you arrive late (15 or more minutes).  Arriving late affects you and other patients whose appointments are after yours.  Also, if you miss three or more appointments without notifying the office, you may be dismissed from the clinic at the provider's discretion.      For prescription refill requests, have your pharmacy contact our office and allow 72 hours for refills to be completed.    Today you received the following chemotherapy and/or immunotherapy agents Cisplatin & Pemetrexed   Pemetrexed injection What is this medicine? PEMETREXED (PEM e TREX ed) is a chemotherapy drug used to treat lung cancers like non-small cell lung cancer and mesothelioma. It may also be used to treat other cancers. This medicine may be used for other purposes; ask your health care provider or pharmacist if you have questions. COMMON BRAND NAME(S): Alimta What should I tell my health care provider before I take this medicine? They need to know if you have any of these conditions:  infection (especially a virus infection such as chickenpox, cold sores, or herpes)  kidney disease  low blood counts, like low white cell, platelet, or red cell counts  lung or breathing disease, like asthma  radiation therapy  an unusual or allergic reaction to pemetrexed, other medicines, foods, dyes, or preservative  pregnant or trying to get pregnant  breast-feeding How should I use this medicine? This drug is given as an  infusion into a vein. It is administered in a hospital or clinic by a specially trained health care professional. Talk to your pediatrician regarding the use of this medicine in children. Special care may be needed. Overdosage: If you think you have taken too much of this medicine contact a poison control center or emergency room at once. NOTE: This medicine is only for you. Do not share this medicine with others. What if I miss a dose? It is important not to miss your dose. Call your doctor or health care professional if you are unable to keep an appointment. What may interact with this medicine? This medicine may interact with the following medications:  Ibuprofen This list may not describe all possible interactions. Give your health care provider a list of all the medicines, herbs, non-prescription drugs, or dietary supplements you use. Also tell them if you smoke, drink alcohol, or use illegal drugs. Some items may interact with your medicine. What should I watch for while using this medicine? Visit your doctor for checks on your progress. This drug may make you feel generally unwell. This is not uncommon, as chemotherapy can affect healthy cells as well as cancer cells. Report any side effects. Continue your course of treatment even though you feel ill unless your doctor tells you to stop. In some cases, you may be given additional medicines to help with side effects. Follow all directions for their use. Call your doctor or health care professional for advice if you get a fever, chills or sore throat, or other symptoms of a cold or flu. Do  not treat yourself. This drug decreases your body's ability to fight infections. Try to avoid being around people who are sick. This medicine may increase your risk to bruise or bleed. Call your doctor or health care professional if you notice any unusual bleeding. Be careful brushing and flossing your teeth or using a toothpick because you may get an  infection or bleed more easily. If you have any dental work done, tell your dentist you are receiving this medicine. Avoid taking products that contain aspirin, acetaminophen, ibuprofen, naproxen, or ketoprofen unless instructed by your doctor. These medicines may hide a fever. Call your doctor or health care professional if you get diarrhea or mouth sores. Do not treat yourself. To protect your kidneys, drink water or other fluids as directed while you are taking this medicine. Do not become pregnant while taking this medicine or for 6 months after stopping it. Women should inform their doctor if they wish to become pregnant or think they might be pregnant. Men should not father a child while taking this medicine and for 3 months after stopping it. This may interfere with the ability to father a child. You should talk to your doctor or health care professional if you are concerned about your fertility. There is a potential for serious side effects to an unborn child. Talk to your health care professional or pharmacist for more information. Do not breast-feed an infant while taking this medicine or for 1 week after stopping it. What side effects may I notice from receiving this medicine? Side effects that you should report to your doctor or health care professional as soon as possible:  allergic reactions like skin rash, itching or hives, swelling of the face, lips, or tongue  breathing problems  redness, blistering, peeling or loosening of the skin, including inside the mouth  signs and symptoms of bleeding such as bloody or black, tarry stools; red or dark-brown urine; spitting up blood or brown material that looks like coffee grounds; red spots on the skin; unusual bruising or bleeding from the eye, gums, or nose  signs and symptoms of infection like fever or chills; cough; sore throat; pain or trouble passing urine  signs and symptoms of kidney injury like trouble passing urine or change in the  amount of urine  signs and symptoms of liver injury like dark yellow or brown urine; general ill feeling or flu-like symptoms; light-colored stools; loss of appetite; nausea; right upper belly pain; unusually weak or tired; yellowing of the eyes or skin Side effects that usually do not require medical attention (report to your doctor or health care professional if they continue or are bothersome):  constipation  mouth sores  nausea, vomiting  unusually weak or tired This list may not describe all possible side effects. Call your doctor for medical advice about side effects. You may report side effects to FDA at 1-800-FDA-1088. Where should I keep my medicine? This drug is given in a hospital or clinic and will not be stored at home. NOTE: This sheet is a summary. It may not cover all possible information. If you have questions about this medicine, talk to your doctor, pharmacist, or health care provider.  2021 Elsevier/Gold Standard (2017-07-25 16:11:33) Cisplatin injection What is this medicine? CISPLATIN (SIS pla tin) is a chemotherapy drug. It targets fast dividing cells, like cancer cells, and causes these cells to die. This medicine is used to treat many types of cancer like bladder, ovarian, and testicular cancers. This medicine may be used  for other purposes; ask your health care provider or pharmacist if you have questions. COMMON BRAND NAME(S): Platinol, Platinol -AQ What should I tell my health care provider before I take this medicine? They need to know if you have any of these conditions:  eye disease, vision problems  hearing problems  kidney disease  low blood counts, like white cells, platelets, or red blood cells  tingling of the fingers or toes, or other nerve disorder  an unusual or allergic reaction to cisplatin, carboplatin, oxaliplatin, other medicines, foods, dyes, or preservatives  pregnant or trying to get pregnant  breast-feeding How should I use this  medicine? This drug is given as an infusion into a vein. It is administered in a hospital or clinic by a specially trained health care professional. Talk to your pediatrician regarding the use of this medicine in children. Special care may be needed. Overdosage: If you think you have taken too much of this medicine contact a poison control center or emergency room at once. NOTE: This medicine is only for you. Do not share this medicine with others. What if I miss a dose? It is important not to miss a dose. Call your doctor or health care professional if you are unable to keep an appointment. What may interact with this medicine? This medicine may interact with the following medications:  foscarnet  certain antibiotics like amikacin, gentamicin, neomycin, polymyxin B, streptomycin, tobramycin, vancomycin This list may not describe all possible interactions. Give your health care provider a list of all the medicines, herbs, non-prescription drugs, or dietary supplements you use. Also tell them if you smoke, drink alcohol, or use illegal drugs. Some items may interact with your medicine. What should I watch for while using this medicine? Your condition will be monitored carefully while you are receiving this medicine. You will need important blood work done while you are taking this medicine. This drug may make you feel generally unwell. This is not uncommon, as chemotherapy can affect healthy cells as well as cancer cells. Report any side effects. Continue your course of treatment even though you feel ill unless your doctor tells you to stop. This medicine may increase your risk of getting an infection. Call your healthcare professional for advice if you get a fever, chills, or sore throat, or other symptoms of a cold or flu. Do not treat yourself. Try to avoid being around people who are sick. Avoid taking medicines that contain aspirin, acetaminophen, ibuprofen, naproxen, or ketoprofen unless  instructed by your healthcare professional. These medicines may hide a fever. This medicine may increase your risk to bruise or bleed. Call your doctor or health care professional if you notice any unusual bleeding. Be careful brushing and flossing your teeth or using a toothpick because you may get an infection or bleed more easily. If you have any dental work done, tell your dentist you are receiving this medicine. Do not become pregnant while taking this medicine or for 14 months after stopping it. Women should inform their healthcare professional if they wish to become pregnant or think they might be pregnant. Men should not father a child while taking this medicine and for 11 months after stopping it. There is potential for serious side effects to an unborn child. Talk to your healthcare professional for more information. Do not breast-feed an infant while taking this medicine. This medicine has caused ovarian failure in some women. This medicine may make it more difficult to get pregnant. Talk to your healthcare professional  if you are concerned about your fertility. This medicine has caused decreased sperm counts in some men. This may make it more difficult to father a child. Talk to your healthcare professional if you are concerned about your fertility. Drink fluids as directed while you are taking this medicine. This will help protect your kidneys. Call your doctor or health care professional if you get diarrhea. Do not treat yourself. What side effects may I notice from receiving this medicine? Side effects that you should report to your doctor or health care professional as soon as possible:  allergic reactions like skin rash, itching or hives, swelling of the face, lips, or tongue  blurred vision  changes in vision  decreased hearing or ringing of the ears  nausea, vomiting  pain, redness, or irritation at site where injected  pain, tingling, numbness in the hands or feet  signs  and symptoms of bleeding such as bloody or black, tarry stools; red or dark brown urine; spitting up blood or brown material that looks like coffee grounds; red spots on the skin; unusual bruising or bleeding from the eyes, gums, or nose  signs and symptoms of infection like fever; chills; cough; sore throat; pain or trouble passing urine  signs and symptoms of kidney injury like trouble passing urine or change in the amount of urine  signs and symptoms of low red blood cells or anemia such as unusually weak or tired; feeling faint or lightheaded; falls; breathing problems Side effects that usually do not require medical attention (report to your doctor or health care professional if they continue or are bothersome):  loss of appetite  mouth sores  muscle cramps This list may not describe all possible side effects. Call your doctor for medical advice about side effects. You may report side effects to FDA at 1-800-FDA-1088. Where should I keep my medicine? This drug is given in a hospital or clinic and will not be stored at home. NOTE: This sheet is a summary. It may not cover all possible information. If you have questions about this medicine, talk to your doctor, pharmacist, or health care provider.  2021 Elsevier/Gold Standard (2018-05-31 15:59:17)       To help prevent nausea and vomiting after your treatment, we encourage you to take your nausea medication as directed.  BELOW ARE SYMPTOMS THAT SHOULD BE REPORTED IMMEDIATELY: . *FEVER GREATER THAN 100.4 F (38 C) OR HIGHER . *CHILLS OR SWEATING . *NAUSEA AND VOMITING THAT IS NOT CONTROLLED WITH YOUR NAUSEA MEDICATION . *UNUSUAL SHORTNESS OF BREATH . *UNUSUAL BRUISING OR BLEEDING . *URINARY PROBLEMS (pain or burning when urinating, or frequent urination) . *BOWEL PROBLEMS (unusual diarrhea, constipation, pain near the anus) . TENDERNESS IN MOUTH AND THROAT WITH OR WITHOUT PRESENCE OF ULCERS (sore throat, sores in mouth, or a  toothache) . UNUSUAL RASH, SWELLING OR PAIN  . UNUSUAL VAGINAL DISCHARGE OR ITCHING   Items with * indicate a potential emergency and should be followed up as soon as possible or go to the Emergency Department if any problems should occur.  Please show the CHEMOTHERAPY ALERT CARD or IMMUNOTHERAPY ALERT CARD at check-in to the Emergency Department and triage nurse.  Should you have questions after your visit or need to cancel or reschedule your appointment, please contact [facility]  [phone] and follow the prompts.  Office hours are 8:00 a.m. to 4:30 p.m. Monday - Friday. Please note that voicemails left after 4:00 p.m. may not be returned until the  following business day.  We are closed weekends and major holidays. You have access to a nurse at all times for urgent questions. Please call the main number to the clinic [phone] and follow the prompts.  For any non-urgent questions, you may also contact your provider using MyChart. We now offer e-Visits for anyone 6 and older to request care online for non-urgent symptoms. For details visit [email or web address].   Also download the MyChart app! Go to the app store, search "MyChart", open the app, select [facility], and log in with your MyChart username and password.  Due to Covid, a mask is required upon entering the hospital/clinic. If you do not have a mask, one will be given to you upon arrival. For doctor visits, patients may have 1 support person aged 38 or older with them. For treatment visits, patients cannot have anyone with them due to current Covid guidelines and our immunocompromised population.

## 2020-10-28 NOTE — Progress Notes (Signed)
Per MD to continue with treatment with HR 104. Treatment team updated  Natalie [name]

## 2020-11-29 ENCOUNTER — Inpatient Hospital Stay: Payer: Medicaid Other | Attending: Oncology

## 2020-11-29 DIAGNOSIS — C3412 Malignant neoplasm of upper lobe, left bronchus or lung: Secondary | ICD-10-CM | POA: Insufficient documentation

## 2020-11-29 DIAGNOSIS — Z452 Encounter for adjustment and management of vascular access device: Secondary | ICD-10-CM | POA: Diagnosis not present

## 2020-11-29 DIAGNOSIS — Z95828 Presence of other vascular implants and grafts: Secondary | ICD-10-CM

## 2020-11-29 MED ORDER — HEPARIN SOD (PORK) LOCK FLUSH 100 UNIT/ML IV SOLN
500.0000 [IU] | Freq: Once | INTRAVENOUS | Status: AC
  Administered 2020-11-29: 500 [IU] via INTRAVENOUS
  Filled 2020-11-29: qty 5

## 2020-11-29 MED ORDER — SODIUM CHLORIDE 0.9% FLUSH
10.0000 mL | Freq: Once | INTRAVENOUS | Status: AC
  Administered 2020-11-29: 10 mL via INTRAVENOUS
  Filled 2020-11-29: qty 10

## 2020-11-29 MED ORDER — HEPARIN SOD (PORK) LOCK FLUSH 100 UNIT/ML IV SOLN
INTRAVENOUS | Status: AC
  Filled 2020-11-29: qty 5

## 2021-01-26 ENCOUNTER — Other Ambulatory Visit: Payer: Self-pay

## 2021-01-26 ENCOUNTER — Inpatient Hospital Stay: Payer: Medicaid Other | Attending: Oncology

## 2021-01-26 ENCOUNTER — Encounter
Admission: RE | Admit: 2021-01-26 | Discharge: 2021-01-26 | Disposition: A | Discharge status: Home / Self Care | Payer: Medicaid Other | Source: Ambulatory Visit | Attending: Oncology | Admitting: Oncology

## 2021-01-26 DIAGNOSIS — C3432 Malignant neoplasm of lower lobe, left bronchus or lung: Secondary | ICD-10-CM | POA: Insufficient documentation

## 2021-01-26 DIAGNOSIS — Z79899 Other long term (current) drug therapy: Secondary | ICD-10-CM | POA: Insufficient documentation

## 2021-01-26 DIAGNOSIS — Z902 Acquired absence of lung [part of]: Secondary | ICD-10-CM | POA: Insufficient documentation

## 2021-01-26 DIAGNOSIS — C787 Secondary malignant neoplasm of liver and intrahepatic bile duct: Secondary | ICD-10-CM | POA: Insufficient documentation

## 2021-01-26 DIAGNOSIS — D649 Anemia, unspecified: Secondary | ICD-10-CM | POA: Insufficient documentation

## 2021-01-26 DIAGNOSIS — Z87891 Personal history of nicotine dependence: Secondary | ICD-10-CM | POA: Insufficient documentation

## 2021-01-26 DIAGNOSIS — E1165 Type 2 diabetes mellitus with hyperglycemia: Secondary | ICD-10-CM | POA: Insufficient documentation

## 2021-01-26 DIAGNOSIS — C771 Secondary and unspecified malignant neoplasm of intrathoracic lymph nodes: Secondary | ICD-10-CM | POA: Diagnosis not present

## 2021-01-26 DIAGNOSIS — C7951 Secondary malignant neoplasm of bone: Secondary | ICD-10-CM | POA: Diagnosis not present

## 2021-01-26 DIAGNOSIS — C7931 Secondary malignant neoplasm of brain: Secondary | ICD-10-CM | POA: Diagnosis not present

## 2021-01-26 DIAGNOSIS — G629 Polyneuropathy, unspecified: Secondary | ICD-10-CM | POA: Diagnosis not present

## 2021-01-26 LAB — COMPREHENSIVE METABOLIC PANEL
ALT: 84 U/L — ABNORMAL HIGH (ref 0–44)
AST: 104 U/L — ABNORMAL HIGH (ref 15–41)
Albumin: 4 g/dL (ref 3.5–5.0)
Alkaline Phosphatase: 103 U/L (ref 38–126)
Anion gap: 12 (ref 5–15)
BUN: 16 mg/dL (ref 8–23)
CO2: 24 mmol/L (ref 22–32)
Calcium: 8.6 mg/dL — ABNORMAL LOW (ref 8.9–10.3)
Chloride: 101 mmol/L (ref 98–111)
Creatinine, Ser: 1.04 mg/dL — ABNORMAL HIGH (ref 0.44–1.00)
GFR, Estimated: >60 mL/min (ref >60)
Glucose, Bld: 131 mg/dL — ABNORMAL HIGH (ref 70–99)
Potassium: 3.4 mmol/L — ABNORMAL LOW (ref 3.5–5.1)
Sodium: 137 mmol/L (ref 135–145)
Total Bilirubin: 0.7 mg/dL (ref 0.3–1.2)
Total Protein: 7.1 g/dL (ref 6.5–8.1)

## 2021-01-26 LAB — CBC WITH DIFFERENTIAL/PLATELET
Abs Immature Granulocytes: 0.02 10*3/uL (ref 0.00–0.07)
Basophils Absolute: 0 10*3/uL (ref 0.0–0.1)
Basophils Relative: 0 %
Eosinophils Absolute: 0 10*3/uL (ref 0.0–0.5)
Eosinophils Relative: 0 %
HCT: 28.5 % — ABNORMAL LOW (ref 36.0–46.0)
Hemoglobin: 8.8 g/dL — ABNORMAL LOW (ref 12.0–15.0)
Immature Granulocytes: 0 %
Lymphocytes Relative: 37 %
Lymphs Abs: 2.1 10*3/uL (ref 0.7–4.0)
MCH: 30.7 pg (ref 26.0–34.0)
MCHC: 30.9 g/dL (ref 30.0–36.0)
MCV: 99.3 fL (ref 80.0–100.0)
Monocytes Absolute: 0.3 10*3/uL (ref 0.1–1.0)
Monocytes Relative: 4 %
Neutro Abs: 3.4 10*3/uL (ref 1.7–7.7)
Neutrophils Relative %: 59 %
Platelets: 197 10*3/uL (ref 150–400)
RBC: 2.87 MIL/uL — ABNORMAL LOW (ref 3.87–5.11)
RDW: 17.9 % — ABNORMAL HIGH (ref 11.5–15.5)
WBC: 5.8 10*3/uL (ref 4.0–10.5)
nRBC: 0 % (ref 0.0–0.2)

## 2021-01-26 LAB — GLUCOSE, CAPILLARY: Glucose-Capillary: 128 mg/dL — ABNORMAL HIGH (ref 70–99)

## 2021-01-26 LAB — MAGNESIUM: Magnesium: 2 mg/dL (ref 1.7–2.4)

## 2021-01-26 IMAGING — CT NM PET TUM IMG RESTAG (PS) SKULL BASE T - THIGH
10 series · 23 of 25 positions shown · non-contrast
Comparison: [DATE]

CLINICAL DATA: Subsequent treatment strategy for non-small cell
lung cancer.

EXAM:
NUCLEAR MEDICINE PET SKULL BASE TO THIGH
TECHNIQUE: 12.64 mCi F-18 FDG was injected intravenously. Full-ring PET imaging
was performed from the skull base to thigh after the radiotracer. CT
data was obtained and used for attenuation correction and anatomic
localization.
Fasting blood glucose: 128 mg/dl

[Series 3: ct wb 5.0 b30f · axial · 5.0mm · 0.98mm/px · z∈[-230,+637]mm · 3 of 290 slices shown]
[im 1/290]
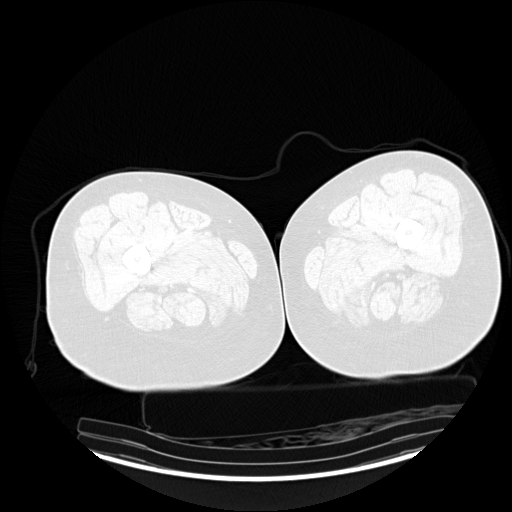
[im 145/290]
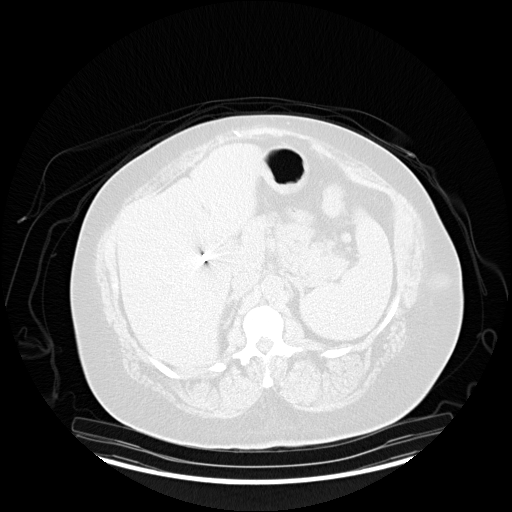
[im 290/290  brain]
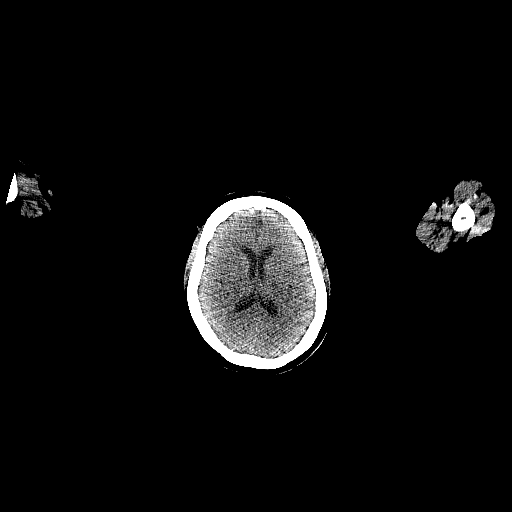

[Series 5: pet wb uncorrected (nac) · axial · 5.0mm · 4.07mm/px · z∈[-230,+637]mm · 3 of 290 slices shown]
[im 1/290]
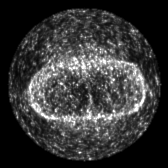
[im 145/290]
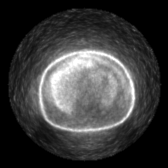
[im 290/290]
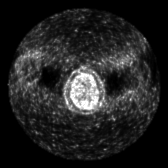

[Series 6: pet wb (ac) · axial · 5.0mm · 4.07mm/px · z∈[+58,+637]mm · 3 of 290 slices shown]
[im 97/290]
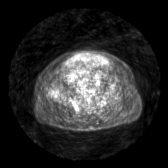
[im 193/290]
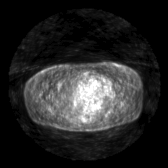
[im 290/290]
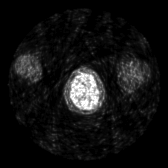

[Series 603: pet axial fused · 4 of 290 slices shown]
[im 1/290]
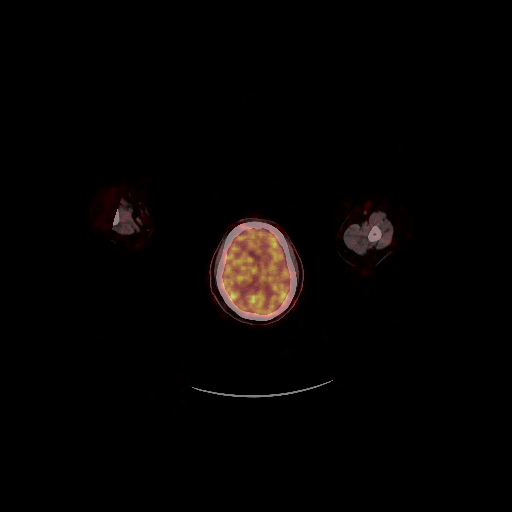
[im 97/290]
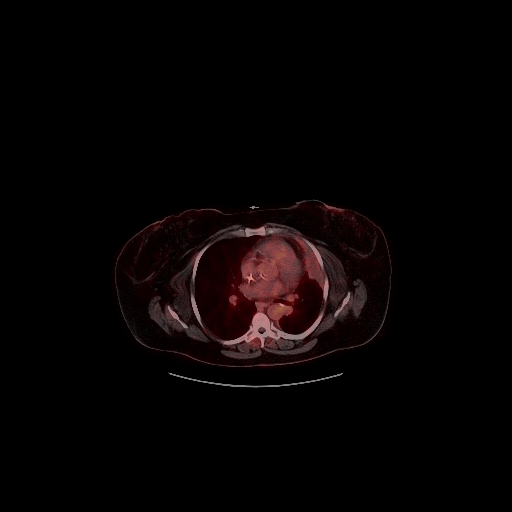
[im 193/290]
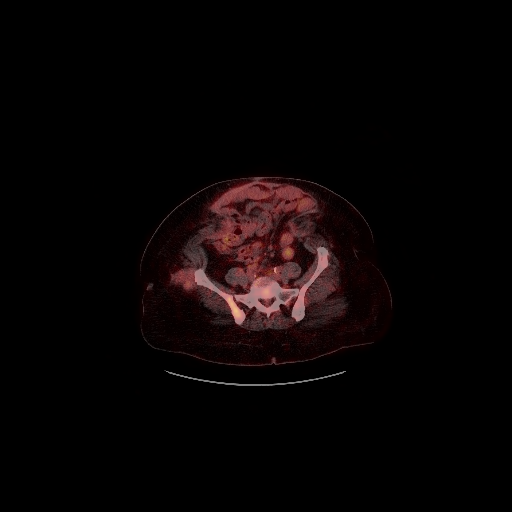
[im 290/290]
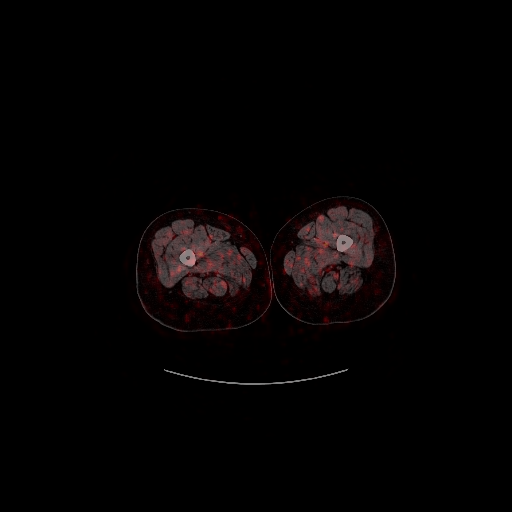

[Series 604: pet coronal fused · 1 of 110 slices shown]
[im 1/110]
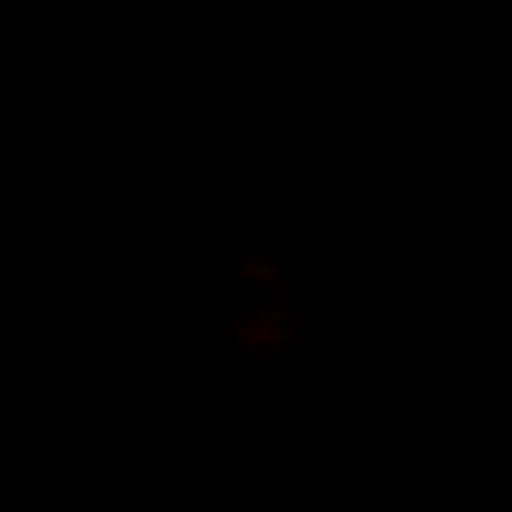

[Series 605: pet sagittal fused · 2 of 171 slices shown]
[im 1/171]
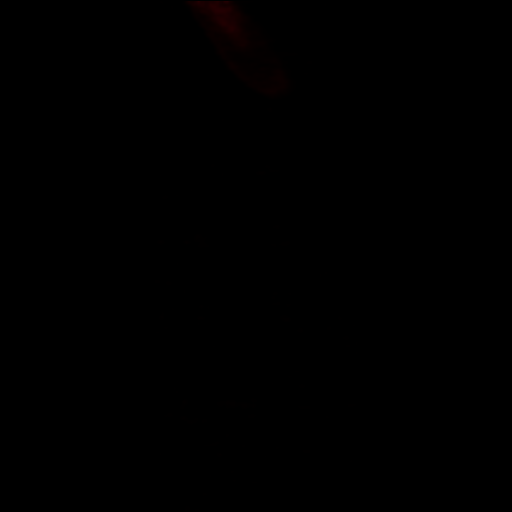
[im 171/171]
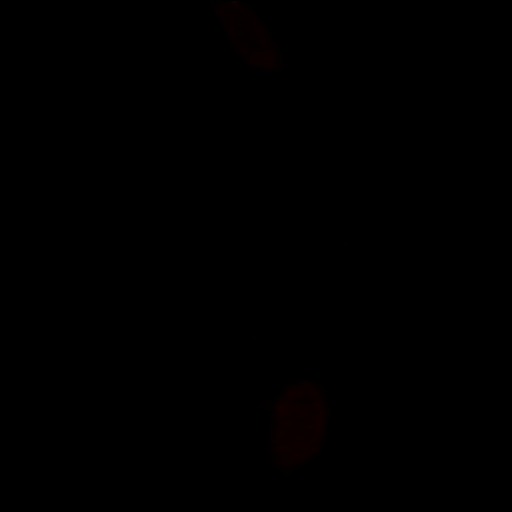

[Series 606: pet axial · 3 of 290 slices shown]
[im 1/290]
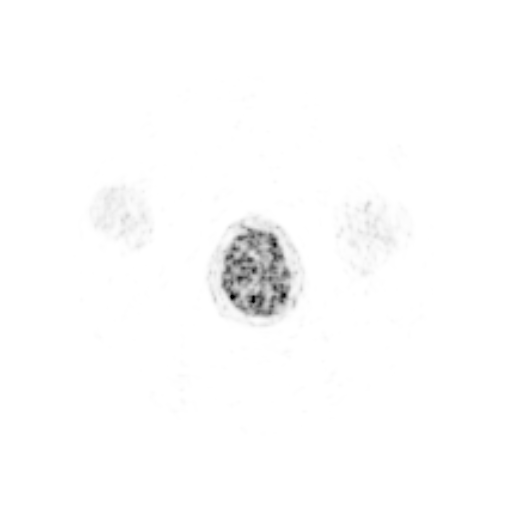
[im 193/290]
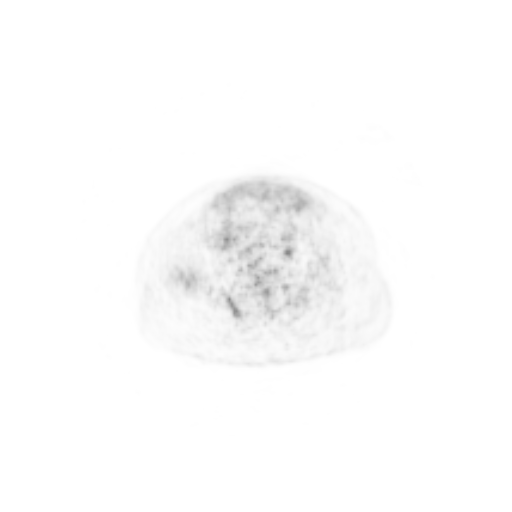
[im 290/290]
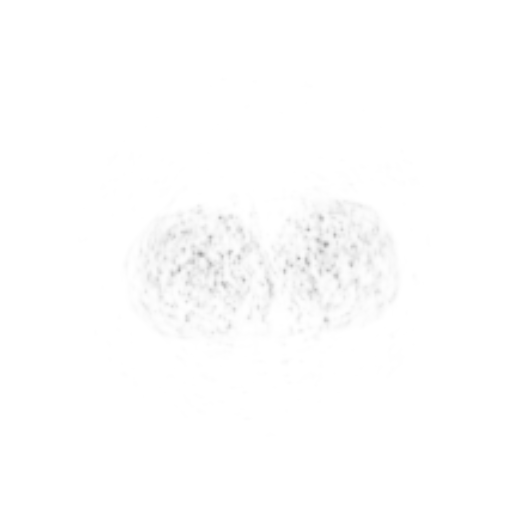

[Series 607: pet coronal · 1 of 117 slices shown]
[im 1/117]
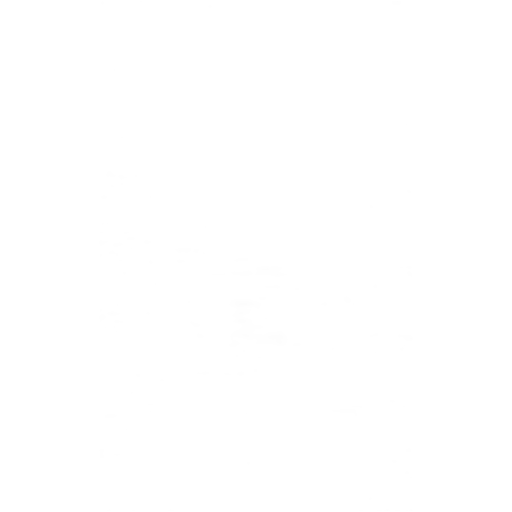

[Series 608: pet sagittal · 2 of 191 slices shown]
[im 1/191]
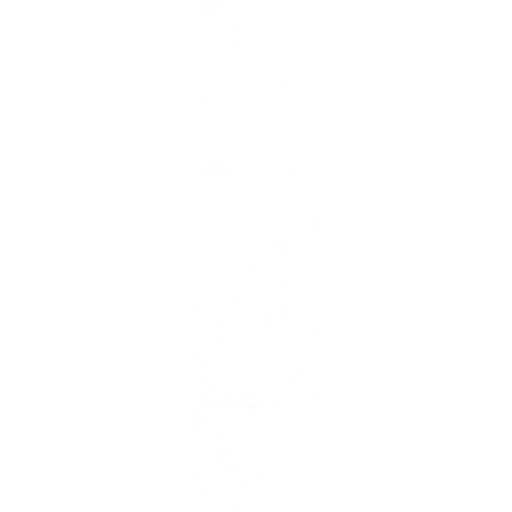
[im 191/191]
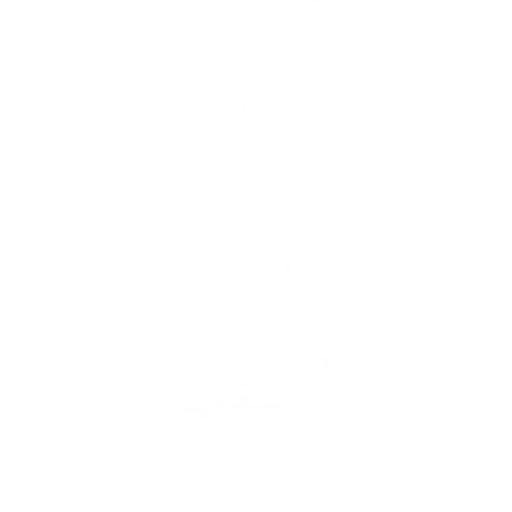

[Series 8149: results mm oncology reading · 3.0mm · 1.27mm/px · 1 of 16 slices shown]
[im 1/16]
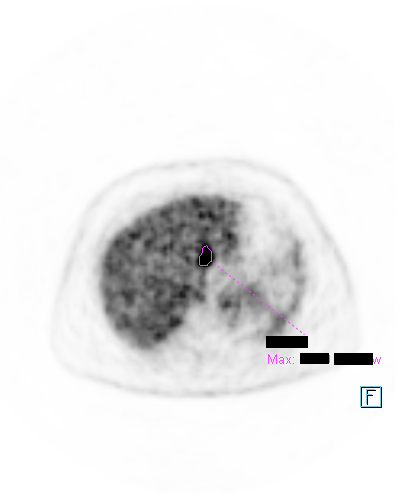

[23 of 25 positions shown; findings below may reference images not displayed]

FINDINGS: Mediastinal blood pool activity: SUV max

Liver activity: SUV max NA

NECK: No hypermetabolic lymph nodes in the neck.

Incidental CT findings: none

CHEST: Status post left lower lobectomy. Moderate loculated pleural
fluid collection is identified overlying the anterior and lateral
left upper lobe overlying mildly FDG avid pleural thickening.
Pleural thickening has a SUV max of 3.57. There is also mild
increased tracer uptake within the loculated pleural fluid with SUV
max of 2.79.

Within the periphery of the left upper lobe there is a focal area
measuring 6 mm with SUV max 1.87, image 80/3. This is nonspecific
and may represent inflammatory or infectious.

FDG avid AP window lymph node measures 1 cm and has an SUV max of
8.24, image 82/3. New from previous exam

Incidental CT findings: Aortic atherosclerosis and coronary artery
calcifications.

ABDOMEN/PELVIS:

Within the posterior aspect of segment 2 there is a 1.9 cm FDG avid
lesion within SUV max of 11.8, image 121/3. New from previous exam.

Equivocal focus of mildly increased uptake is noted within the
posterior dome of right hepatic lobe with SUV max of 6.03. New from
previous exam.

No abnormal FDG uptake within the pancreas, spleen or adrenal
glands. No FDG avid abdominopelvic lymph nodes.

Incidental CT findings: Aortic atherosclerosis and coronary artery
calcifications. Status post ventral abdominal wall herniorrhaphy
with continued ventral protrusion nonobstructed loops of small and
large bowel. Ovarian dermoid is again noted within the pelvis
measuring 6.3 cm.

SKELETON: Multifocal FDG avid bone metastases identified. Increased
in multiplicity compared with previous exam.

FDG avid sclerotic lesion within the posterior right iliac bone
measures 2.9 cm with SUV max 7.51, image 200/3. Previously this had
an SUV max of 6.57.

9 mm sclerotic lesion within the left humeral head has an SUV max of
7.15, image 53/3. New from previous exam.

Index lesion within the L3 vertebral body measures 1.5 cm and has an
SUV max of 12.86, image 166/609. New from previous exam.

Lesion within the T12 vertebral body without corresponding CT
abnormality has an SUV max of 10.4. New from previous exam.

7 mm sclerotic lesion within the tip of the left scapula within SUV
max of 2.85, image 101/609. New from previous exam.

FDG avid lesion within the xiphoid process has an SUV max of 4.32.
New from previous exam.

Previous soft tissue mass within the right flank adjacent to the
right iliac wing measures 4.9 x 3.8 cm and has an SUV max of 3.65.
On the previous exam this measured 4.9 by 4.1 cm and had an SUV max
of 2.7.

Incidental CT findings: none
IMPRESSION: 1. Status post left lower lobectomy with new moderate volume
loculated left pleural fluid collection with mild overlying FDG avid
soft tissue thickening.
2. FDG avid left AP window lymph node compatible with new metastatic
adenopathy.
3. New FDG avid liver metastases
4. Interval progression of multifocal bone metastases
5. Similar appearance of mildly FDG avid soft tissue mass within the
right posterior flank adjacent to the iliac bone.

## 2021-01-26 MED ORDER — HEPARIN SOD (PORK) LOCK FLUSH 100 UNIT/ML IV SOLN
INTRAVENOUS | Status: AC
  Filled 2021-01-26: qty 5

## 2021-01-26 MED ORDER — SODIUM CHLORIDE 0.9% FLUSH
10.0000 mL | Freq: Once | INTRAVENOUS | Status: AC
  Administered 2021-01-26: 10 mL via INTRAVENOUS
  Filled 2021-01-26: qty 10

## 2021-01-26 MED ORDER — HEPARIN SOD (PORK) LOCK FLUSH 100 UNIT/ML IV SOLN
500.0000 [IU] | Freq: Once | INTRAVENOUS | Status: AC
  Administered 2021-01-26: 500 [IU] via INTRAVENOUS
  Filled 2021-01-26: qty 5

## 2021-01-26 MED ORDER — FLUDEOXYGLUCOSE F - 18 (FDG) INJECTION
12.6400 | Freq: Once | INTRAVENOUS | Status: AC | PRN
  Administered 2021-01-26: 12.64 via INTRAVENOUS

## 2021-01-29 NOTE — Progress Notes (Signed)
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   INTERVAL HISTORY: Patient returns to clinic today for further evaluation, discussion of her imaging results, and treatment planning.  She currently feels well and is asymptomatic.  She has no neurologic complaints.  She denies any recent fevers or illnesses.  She has a good appetite and denies weight loss.  She has no chest pain, shortness of breath, cough, or hemoptysis.  She denies any nausea, vomiting, constipation, or diarrhea.  She has no urinary complaints.  Patient offers no specific complaints today.    REVIEW OF SYSTEMS:   Review of Systems  Constitutional: Negative.  Negative for fever, malaise/fatigue and weight loss.  Respiratory: Negative.  Negative for cough, hemoptysis and shortness of breath.   Cardiovascular: Negative.  Negative for chest pain and leg swelling.  Gastrointestinal: Negative.  Negative for abdominal pain.  Genitourinary: Negative.  Negative for dysuria and flank pain.  Musculoskeletal: Negative.  Negative for back pain and joint pain.  Skin: Negative.  Negative for rash.  Neurological: Negative.  Negative for dizziness, focal weakness, weakness and headaches.  Psychiatric/Behavioral: Negative.  The patient is not nervous/anxious.    As per HPI. Otherwise, a complete review of systems is negative.  PAST MEDICAL HISTORY: Past Medical History:  Diagnosis Date   Asthma    COPD (chronic obstructive pulmonary disease) ([place])    CVA (cerebral vascular accident) ([place]) 2014   Depression    Diabetes mellitus ([place])    Diabetes mellitus without complication ([place])    Hepatitis C    History of kidney stones     Hyperlipidemia    Iron deficiency anemia    Obesity    OSA on CPAP    Mild, noncompliant with CPAP said cpap machine was recalled, is waiting for a replacement    Paranoia ([place])    Schizoaffective disorder, bipolar type ([place])     PAST SURGICAL HISTORY: Past Surgical History:  Procedure Laterality Date   BREAST CYST EXCISION Bilateral 2013   Benign   CESAREAN SECTION     CHOLECYSTECTOMY     COLONOSCOPY     ESOPHAGOGASTRODUODENOSCOPY  2020   done in [place] Left 2002   INTERCOSTAL NERVE BLOCK N/A 07/12/2020   Procedure: INTERCOSTAL NERVE BLOCK;  Surgeon: Melrose Nakayama, MD;  Location: [facility];  Service: Thoracic;  Laterality: N/A;   IR IMAGING GUIDED PORT INSERTION  08/20/2020   LYMPH NODE DISSECTION Left 07/12/2020   Procedure: LYMPH NODE DISSECTION;  Surgeon: Melrose Nakayama, MD;  Location: [facility];  Service: Thoracic;  Laterality: Left;   UMBILICAL HERNIA REPAIR     VIDEO BRONCHOSCOPY WITH ENDOBRONCHIAL NAVIGATION N/A 06/07/2020   Procedure: VIDEO BRONCHOSCOPY WITH ENDOBRONCHIAL NAVIGATION;  Surgeon: Tyler Pita, MD;  Location: [facility];  Service: Pulmonary;  Laterality: N/A;    FAMILY HISTORY: No family history on file.  ADVANCED DIRECTIVES (Y/N):  N  HEALTH MAINTENANCE: Social History   Tobacco Use   Smoking status: Former    Packs/day: 2.00    Years: 38.00    Pack years: 76.00    Types: Cigarettes    Quit date: 2012    Years since quitting: 10.6   Smokeless tobacco:  Former  Scientific laboratory technician Use: Never used  Substance Use Topics   Alcohol use: Not Currently    Comment: occassionaly   Drug use: Not Currently     Colonoscopy:  PAP:  Bone density:  Lipid panel:  No Known Allergies  Current Outpatient Medications  Medication Sig Dispense Refill   acetaminophen (TYLENOL) 650 MG CR tablet Take 650 mg by mouth 2 (two) times daily.     albuterol (VENTOLIN HFA) 108 (90 Base) MCG/ACT inhaler Inhale 2 puffs into the lungs  every 6 (six) hours as needed for wheezing or shortness of breath.     ASPERCREME ORIGINAL 10 % cream Apply topically.     atorvastatin (LIPITOR) 40 MG tablet Take 40 mg by mouth at bedtime.     benztropine (COGENTIN) 1 MG tablet Take 1 mg by mouth at bedtime.     citalopram (CELEXA) 20 MG tablet Take 20 mg by mouth every morning.     Dulaglutide (TRULICITY) 1.5 KX/3.8HW SOPN Inject 1.5 mg into the skin every Friday.     Fluticasone-Umeclidin-Vilant (TRELEGY ELLIPTA) 100-62.5-25 MCG/INH AEPB Inhale 1 puff into the lungs daily.     folic acid (FOLVITE) 1 MG tablet Take 1 tablet (1 mg total) by mouth daily. Start 5-7 days before Alimta chemotherapy. Continue until 21 days after Alimta completed. 100 tablet 3   gabapentin (NEURONTIN) 300 MG capsule Take 300 mg by mouth 2 (two) times daily.     insulin detemir (LEVEMIR) 100 UNIT/ML injection Inject 30 Units into the skin daily.     latanoprost (XALATAN) 0.005 % ophthalmic solution Place 1 drop into both eyes daily.     lidocaine (LIDODERM) 5 % Place 2 patches onto the skin See admin instructions. Apply 1 patch to each foot every 12 hours     lidocaine-prilocaine (EMLA) cream Apply to affected area once 30 g 3   LORazepam (ATIVAN) 1 MG tablet Take 1 mg by mouth 2 (two) times daily.     LYRICA 100 MG capsule Take 100 mg by mouth 2 (two) times daily.     ondansetron (ZOFRAN) 8 MG tablet Take 1 tablet (8 mg total) by mouth 2 (two) times daily as needed (Nausea or vomiting). Start if needed on the third day after cisplatin. 60 tablet 1   prochlorperazine (COMPAZINE) 10 MG tablet Take 1 tablet (10 mg total) by mouth every 6 (six) hours as needed (Nausea or vomiting). 60 tablet 1   SENNA PLUS 8.6-50 MG tablet Take 1 tablet by mouth at bedtime.     traMADol (ULTRAM) 50 MG tablet Take 50 mg by mouth every 6 (six) hours as needed.     ziprasidone (GEODON) 80 MG capsule Take 80 mg by mouth at bedtime.     LYRICA 25 MG capsule Take 3 capsule by mouth twice a day   for nerve pain (for respite) STOP MED 4.5.22 (Patient not taking: Reported on 02/01/2021)     LYRICA 75 MG capsule Take 1 capsule by mouth twice a day  for nerve pain (Patient not taking: Reported on 02/01/2021)     No current facility-administered medications for this visit.    OBJECTIVE: Vitals:   02/01/21 1006  BP: (!) 153/99  Pulse: 84  Resp: 18  Temp: 98.3 F (36.8 C)  SpO2: 99%     Body mass index is 42.8 kg/m.    ECOG FS:0 - Asymptomatic  General: Well-developed, well-nourished, no acute distress.  Sitting in a wheelchair. Eyes: Pink  conjunctiva, anicteric sclera. HEENT: Normocephalic, moist mucous membranes. Lungs: No audible wheezing or coughing. Heart: Regular rate and rhythm. Abdomen: Soft, nontender, no obvious distention. Musculoskeletal: No edema, cyanosis, or clubbing. Neuro: Alert, answering all questions appropriately. Cranial nerves grossly intact. Skin: No rashes or petechiae noted. Psych: Normal affect.   LAB RESULTS:  Lab Results  Component Value Date   NA 137 01/26/2021   K 3.4 (L) 01/26/2021   CL 101 01/26/2021   CO2 24 01/26/2021   GLUCOSE 131 (H) 01/26/2021   BUN 16 01/26/2021   CREATININE 1.04 (H) 01/26/2021   CALCIUM 8.6 (L) 01/26/2021   PROT 7.1 01/26/2021   ALBUMIN 4.0 01/26/2021   AST 104 (H) 01/26/2021   ALT 84 (H) 01/26/2021   ALKPHOS 103 01/26/2021   BILITOT 0.7 01/26/2021   GFRNONAA >60 01/26/2021   GFRAA >60 01/30/2020    Lab Results  Component Value Date   WBC 5.8 01/26/2021   NEUTROABS 3.4 01/26/2021   HGB 8.8 (L) 01/26/2021   HCT 28.5 (L) 01/26/2021   MCV 99.3 01/26/2021   PLT 197 01/26/2021     STUDIES: NM PET Image Restag (PS) Skull Base To Thigh  Result Date: 01/27/2021 CLINICAL DATA:  Subsequent treatment strategy for non-small cell lung cancer. EXAM: NUCLEAR MEDICINE PET SKULL BASE TO THIGH TECHNIQUE: 12.64 mCi F-18 FDG was injected intravenously. Full-ring PET imaging was performed from the skull base to  thigh after the radiotracer. CT data was obtained and used for attenuation correction and anatomic localization. Fasting blood glucose: 128 mg/dl COMPARISON:  05/06/2020 FINDINGS: Mediastinal blood pool activity: SUV max 3.99 Liver activity: SUV max NA NECK: No hypermetabolic lymph nodes in the neck. Incidental CT findings: none CHEST: Status post left lower lobectomy. Moderate loculated pleural fluid collection is identified overlying the anterior and lateral left upper lobe overlying mildly FDG avid pleural thickening. Pleural thickening has a SUV max of 3.57. There is also mild increased tracer uptake within the loculated pleural fluid with SUV max of 2.79. Within the periphery of the left upper lobe there is a focal area measuring 6 mm with SUV max 1.87, image 80/3. This is nonspecific and may represent inflammatory or infectious. FDG avid AP window lymph node measures 1 cm and has an SUV max of 8.24, image 82/3. New from previous exam Incidental CT findings: Aortic atherosclerosis and coronary artery calcifications. ABDOMEN/PELVIS: Within the posterior aspect of segment 2 there is a 1.9 cm FDG avid lesion within SUV max of 11.8, image 121/3. New from previous exam. Equivocal focus of mildly increased uptake is noted within the posterior dome of right hepatic lobe with SUV max of 6.03. New from previous exam. No abnormal FDG uptake within the pancreas, spleen or adrenal glands. No FDG avid abdominopelvic lymph nodes. Incidental CT findings: Aortic atherosclerosis and coronary artery calcifications. Status post ventral abdominal wall herniorrhaphy with continued ventral protrusion nonobstructed loops of small and large bowel. Ovarian dermoid is again noted within the pelvis measuring 6.3 cm. SKELETON: Multifocal FDG avid bone metastases identified. Increased in multiplicity compared with previous exam. FDG avid sclerotic lesion within the posterior right iliac bone measures 2.9 cm with SUV max 7.51, image 200/3.  Previously this had an SUV max of 6.57. 9 mm sclerotic lesion within the left humeral head has an SUV max of 7.15, image 53/3. New from previous exam. Index lesion within the L3 vertebral body measures 1.5 cm and has an SUV max of 12.86, image 166/609. New from previous exam. Lesion  within the T12 vertebral body without corresponding CT abnormality has an SUV max of 10.4. New from previous exam. 7 mm sclerotic lesion within the tip of the left scapula within SUV max of 2.85, image 101/609. New from previous exam. FDG avid lesion within the xiphoid process has an SUV max of 4.32. New from previous exam. Previous soft tissue mass within the right flank adjacent to the right iliac wing measures 4.9 x 3.8 cm and has an SUV max of 3.65. On the previous exam this measured 4.9 by 4.1 cm and had an SUV max of 2.7. Incidental CT findings: none IMPRESSION: 1. Status post left lower lobectomy with new moderate volume loculated left pleural fluid collection with mild overlying FDG avid soft tissue thickening. 2. FDG avid left AP window lymph node compatible with new metastatic adenopathy. 3. New FDG avid liver metastases 4. Interval progression of multifocal bone metastases 5. Similar appearance of mildly FDG avid soft tissue mass within the right posterior flank adjacent to the iliac bone. Electronically Signed   By: Kerby Moors M.D.   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  PLAN:    1.  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  Patient also noted to have a right iliac bony hypermetabolism as well as a soft tissue mass adjacent suspicious for isolated metastasis.  Soft  tissue mass was biopsied on May 19, 2020 which was negative for malignancy.  Patient underwent resection on July 12, 2020 which revealed 4 positive lymph nodes.  MRI of the brain on August 25, 2020 for metastatic disease.  She completed adjuvant cisplatin and pemetrexed on Oct 28, 2020.  Repeat PET scan results from January 27, 2021 reviewed independently and reported as above with progressive disease with new metastatic adenopathy in the chest, liver metastasis, and multifocal bone metastasis.  Patient wishes to continue with aggressive chemotherapy and given her EGFR mutation we will proceed with Tagrisso.  Return to clinic in approximately 3 weeks after initiating treatment to assess her toleration.  Appreciate clinical pharmacy input.    2.  Anemia: Hemoglobin is trended down to 8.8, monitor. 3.  Hyperglycemia: Patient has improved blood glucose control.  Continue monitoring per primary care. 4.  Neuropathy: Continue Lyrica as prescribed.   Patient expressed understanding and was in agreement with this plan. She also understands that She can call clinic at any time with any questions, concerns, or complaints.   Cancer Staging Primary adenocarcinoma of lower lobe of left lung [facility]) Staging form: Lung, AJCC 8th Edition - Pathologic stage from 07/27/2020: Stage IIB (pT2b, pN1, cM0) - Signed by Lloyd Huger, MD on 07/27/2020   Lloyd Huger, MD   02/02/2021 5:59 AM

## 2021-02-01 ENCOUNTER — Inpatient Hospital Stay (HOSPITAL_BASED_OUTPATIENT_CLINIC_OR_DEPARTMENT_OTHER): Payer: Medicaid Other | Admitting: Oncology

## 2021-02-01 ENCOUNTER — Telehealth: Payer: Self-pay | Admitting: Pharmacist

## 2021-02-01 ENCOUNTER — Encounter (HOSPITAL_COMMUNITY): Payer: Self-pay | Admitting: Oncology

## 2021-02-01 VITALS — BP 153/99 | HR 84 | Temp 98.3°F | Resp 18 | Wt 234.0 lb

## 2021-02-01 DIAGNOSIS — C3432 Malignant neoplasm of lower lobe, left bronchus or lung: Secondary | ICD-10-CM | POA: Diagnosis not present

## 2021-02-01 NOTE — Telephone Encounter (Signed)
Oral Chemotherapy Pharmacist Encounter  Newman Nip is being provided through [facility]. They completed an ECG and collected lab today for Ms. Rayborn. Pending these results, they will provide the medication to Ms. Felmlee and have her get started. I asked Ms. Falor to call me when she takes her first dose so her f/u with the office can be scheduled.  Patient Education I spoke with patient for overview of new oral chemotherapy medication: Tagrisso (osimertinib) for the treatment of progressive Stage IV non-small cell lung carcinoma with EGFR S768I mutation, planned duration until disease progression or unacceptable drug toxicity.   Counseled patient on administration, dosing, side effects, monitoring, drug-food interactions, safe handling, storage, and disposal. Patient will take 1 tablet (80 mg total) by mouth daily.  Side effects include but not limited to: rash, diarrhea, decreased wbc/hgb/plt.    Reviewed with patient importance of keeping a medication schedule and plan for any missed doses.  After discussion with patient no patient barriers to medication adherence identified.   Ms. Logie voiced understanding and appreciation. All questions answered. Medication handout provided.  Provided patient with Oral [facility] phone number. Patient knows to call the office with questions or concerns. Oral Chemotherapy Navigation Clinic will continue to follow.  Darl Pikes, PharmD, BCPS, BCOP, CPP Hematology/Oncology Clinical Pharmacist Practitioner [facility]/AP [facility] [phone]  02/11/2021 4:04 PM

## 2021-02-01 NOTE — Progress Notes (Signed)
Patient reports rare n/v which her medication helps. She fell out of the bed 2 weeks ago and hurt her legs and shoulder  but she states that its better now

## 2021-02-02 ENCOUNTER — Other Ambulatory Visit (HOSPITAL_COMMUNITY): Payer: Self-pay

## 2021-02-02 ENCOUNTER — Telehealth: Payer: Self-pay | Admitting: Pharmacy Technician

## 2021-02-02 ENCOUNTER — Encounter: Payer: Self-pay | Admitting: Oncology

## 2021-02-04 NOTE — Telephone Encounter (Signed)
Oral Oncology Patient Advocate Encounter  Patient is insured through [employer].  I called on 8/17, 8/18, and 8/19 to speak with someone to explain their prescription process.    Luisa Dago called me today and said that the patients [facility] physician and the medical director had to approve the Tagrisso since it is a high cost medication.  She is going to call me back on Monday to let me know what needs to be done for the patient to receive her medication.  [place] Patient [place] Phone [phone] Fax [phone] 02/04/2021 4:13 PM

## 2021-02-09 NOTE — Telephone Encounter (Signed)
Called yesterday and spoke to a nurse at [facility] to check on status of Tagrisso.  Nurse stated that Newman Nip was approved and an order was sent to their pharmacy.   I called the patient to let her know that her medication would be filled with [facility]. She said that the medication would either be mailed to her home or that she would pick it up from the facility if it was there. I told the patient to call me when she received the medication so the pharmacist could educate her on the medication.  [place] Patient [place] Phone [phone] Fax [phone] 02/09/2021 11:28 AM

## 2021-02-09 NOTE — Telephone Encounter (Signed)
Osimertinib start date TBD. Patient will call the office when she receives mail delivery or pick up the medication from facility.  Oral Oncology Student Pharmacist Encounter  Received new prescription for Tagrisso (osimertinib) for the treatment of progressive Stage IV non-small cell lung carcinoma of the left lower lobe, planned duration until disease progression or unacceptable toxicity.  Labs from 01/26/21 assessed, Hgb (8.8) trending down, WBC (5.8) improved from May, PLT dropped to 197, K 3.4, Mg 2.0. LFTs elevated: AST 104, ALT 84; total bilirubin 0.7. Prescription dose and frequency assessed.   Current medication list in Epic reviewed, 4 DDIs with ziprasidone, atorvastatin, citalopram, and ondansetron identified:  Concurrent use of ziprasidone, citalopram, and ondansetron may enhance the QTc-prolonging effect of osimertinib. Baseline labs and heart rate assessed: EKG QTc 452 (07/08/20), HR 84 (8/16), K 3.4, Mg 2.0. Monitor for signs and symptoms of hypokalemia and hypomagnesemia as these are risk factors of arrhythmia. Clinical trials of Tagrisso enrolled patients with baseline QTc <470 msec and found 0.9% of patients had a QTc of >500 msec during treatment and 3.6% of patients had an increase in QTc by >60 msec. Recheck EKG and electrolytes at follow-up appointment in 3 weeks.  Osimertinib is a BCRP/ABCG2 inhibitor and may increase the serum concentration of atorvastatin. A phase I trial that studied rosuvastatin 20 mg being co-administered with osimertinib reported increased rosuvastatin AUC by 35% and Cmax by 72%, however, with no clinical relevancy. Recommend monitoring for increased atorvastatin AEs (e.g. myalgia, elevated LFTs).  Patient previously reported noncompliant with medications due to cost concerns per 07/09/20 note. Tagrisso will be fully covered by [facility].  Prescription has been e-scribed to the [facility] for benefits analysis and approval.  Oral Oncology  Clinic will continue to follow for insurance authorization, copayment issues, initial counseling and start date.  Patient agreed to treatment on 02/01/21 per MD documentation.  Wynelle Cleveland, PharmD Candidate [facility] [phone]  02/09/2021 2:16 PM

## 2021-02-11 MED ORDER — OSIMERTINIB MESYLATE 80 MG PO TABS: 80.0000 mg | ORAL_TABLET | Freq: Every day | ORAL | 2 refills | Status: DC

## 2021-02-14 ENCOUNTER — Encounter: Payer: Self-pay | Admitting: Oncology

## 2021-02-14 NOTE — Telephone Encounter (Signed)
Kem Boroughs with [facility] called me on 8/25 to let me know that before they could dispense the Tagrisso to the patient that she would need an EKG reviewed by the cardiologist to clear her before starting.  Alyson, oral chemo pharmacist, called patient on 8/29 to check to see if she had the EKG done and received medication.  Patient said she would receive medication on Monday 8/29.  [place] Patient [place] Phone [phone] Fax [phone] 02/14/2021 11:33 AM

## 2021-02-16 NOTE — Telephone Encounter (Signed)
Had not heard from Natalie [name] about starting her Tagrisso. Per Natalie [name] they had the medication and she would be able to get started on Monday 02/14/21. Natalie [name] said that she has not yet received the medication and [facility] told her they were still waiting for it to come in. Natalie [name] will call [facility] to see what is going on.

## 2021-02-16 NOTE — Telephone Encounter (Signed)
Natalie [name] called to let me know that [facility] will be giving her her Tagrisso on Friday 02/18/21 and she plans on taking her first dose on Saturday 02/19/21. [facility], who did a baseline ECG with repeat an ECG on Tuesday 02/22/21, per Natalie [name]. Will call [facility] next week to see about obtaining a copy of her ECGs.  Message sent to have Natalie [name] scheduled for a f/u appt on 03/10/21.   Natalie [name] did not have any questions about getting started.

## 2021-02-17 NOTE — Telephone Encounter (Signed)
Oral Oncology Patient Advocate Encounter  Patient spoke with Alyson on 02/16/21 and informed her that she should receive her medication Friday and start on Saturday. [facility] has also scheduled her another appt for an EKG.  [place] Patient [place] Phone [phone] Fax [phone] 02/17/2021 2:19 PM

## 2021-03-04 ENCOUNTER — Encounter: Payer: Self-pay | Admitting: Oncology

## 2021-03-07 NOTE — Progress Notes (Signed)
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   INTERVAL HISTORY: Patient returns to clinic today for repeat laboratory work, further evaluation, and to assess her toleration of Tagrisso.  She continues to feel well and remains asymptomatic.  She is tolerating treatment well without significant side effects.  She has no neurologic complaints.  She denies any recent fevers or illnesses.  She has a good appetite and denies weight loss.  She has no chest pain, shortness of breath, cough, or hemoptysis.  She denies any nausea, vomiting, constipation, or diarrhea.  She has no urinary complaints.  Patient offers no specific complaints today.  REVIEW OF SYSTEMS:   Review of Systems  Constitutional: Negative.  Negative for fever, malaise/fatigue and weight loss.  Respiratory: Negative.  Negative for cough, hemoptysis and shortness of breath.   Cardiovascular: Negative.  Negative for chest pain and leg swelling.  Gastrointestinal: Negative.  Negative for abdominal pain.  Genitourinary: Negative.  Negative for dysuria and flank pain.  Musculoskeletal: Negative.  Negative for back pain and joint pain.  Skin: Negative.  Negative for rash.  Neurological: Negative.  Negative for dizziness, focal weakness, weakness and headaches.  Psychiatric/Behavioral: Negative.  The patient is not nervous/anxious.    As per HPI. Otherwise, a complete review of systems is negative.  PAST MEDICAL HISTORY: Past Medical History:  Diagnosis Date   Asthma    COPD (chronic obstructive pulmonary disease) ([place])    CVA (cerebral vascular accident) ([place]) 2014   Depression    Diabetes mellitus ([place])    Diabetes  mellitus without complication ([place])    Hepatitis C    History of kidney stones    Hyperlipidemia    Iron deficiency anemia    Obesity    OSA on CPAP    Mild, noncompliant with CPAP said cpap machine was recalled, is waiting for a replacement    Paranoia ([place])    Schizoaffective disorder, bipolar type ([place])     PAST SURGICAL HISTORY: Past Surgical History:  Procedure Laterality Date   BREAST CYST EXCISION Bilateral 2013   Benign   CESAREAN SECTION     CHOLECYSTECTOMY     COLONOSCOPY     ESOPHAGOGASTRODUODENOSCOPY  2020   done in [place] Left 2002   INTERCOSTAL NERVE BLOCK N/A 07/12/2020   Procedure: INTERCOSTAL NERVE BLOCK;  Surgeon: Melrose Nakayama, MD;  Location: [facility];  Service: Thoracic;  Laterality: N/A;   IR IMAGING GUIDED PORT INSERTION  08/20/2020   LYMPH NODE DISSECTION Left 07/12/2020   Procedure: LYMPH NODE DISSECTION;  Surgeon: Melrose Nakayama, MD;  Location: [facility];  Service: Thoracic;  Laterality: Left;   UMBILICAL HERNIA REPAIR     VIDEO BRONCHOSCOPY WITH ENDOBRONCHIAL NAVIGATION N/A 06/07/2020   Procedure: VIDEO BRONCHOSCOPY WITH ENDOBRONCHIAL NAVIGATION;  Surgeon: Tyler Pita, MD;  Location: [facility];  Service: Pulmonary;  Laterality: N/A;    FAMILY HISTORY: History reviewed. No pertinent family history.  ADVANCED DIRECTIVES (Y/N):  N  HEALTH MAINTENANCE: Social History   Tobacco Use   Smoking status: Former    Packs/day: 2.00    Years: 38.00    Pack years: 76.00    Types: Cigarettes    Quit date:  2012    Years since quitting: 10.7   Smokeless tobacco: Former  Scientific laboratory technician Use: Never used  Substance Use Topics   Alcohol use: Not Currently    Comment: occassionaly   Drug use: Not Currently     Colonoscopy:  PAP:  Bone density:  Lipid panel:  No Known Allergies  Current Outpatient Medications  Medication Sig Dispense Refill   acetaminophen (TYLENOL) 650 MG CR tablet Take 650 mg by mouth 2 (two)  times daily.     albuterol (VENTOLIN HFA) 108 (90 Base) MCG/ACT inhaler Inhale 2 puffs into the lungs every 6 (six) hours as needed for wheezing or shortness of breath.     ASPERCREME ORIGINAL 10 % cream Apply topically.     atorvastatin (LIPITOR) 40 MG tablet Take 40 mg by mouth at bedtime.     benztropine (COGENTIN) 1 MG tablet Take 1 mg by mouth at bedtime.     citalopram (CELEXA) 20 MG tablet Take 20 mg by mouth every morning.     Dulaglutide (TRULICITY) 1.5 TG/6.2IR SOPN Inject 1.5 mg into the skin every Friday.     Fluticasone-Umeclidin-Vilant (TRELEGY ELLIPTA) 100-62.5-25 MCG/INH AEPB Inhale 1 puff into the lungs daily.     folic acid (FOLVITE) 1 MG tablet Take 1 tablet (1 mg total) by mouth daily. Start 5-7 days before Alimta chemotherapy. Continue until 21 days after Alimta completed. 100 tablet 3   gabapentin (NEURONTIN) 300 MG capsule Take 300 mg by mouth 2 (two) times daily.     insulin detemir (LEVEMIR) 100 UNIT/ML injection Inject 30 Units into the skin daily.     latanoprost (XALATAN) 0.005 % ophthalmic solution Place 1 drop into both eyes daily.     lidocaine (LIDODERM) 5 % Place 2 patches onto the skin See admin instructions. Apply 1 patch to each foot every 12 hours     lidocaine-prilocaine (EMLA) cream Apply to affected area once 30 g 3   Loperamide HCl (IMODIUM PO) SMARTSIG:1 Capsule(s) By Mouth 1-2 Times Daily PRN     LORazepam (ATIVAN) 1 MG tablet Take 1 mg by mouth 2 (two) times daily.     LYRICA 100 MG capsule Take 100 mg by mouth 2 (two) times daily.     ondansetron (ZOFRAN) 8 MG tablet Take 1 tablet (8 mg total) by mouth 2 (two) times daily as needed (Nausea or vomiting). Start if needed on the third day after cisplatin. 60 tablet 1   osimertinib mesylate (TAGRISSO) 80 MG tablet Take 1 tablet (80 mg total) by mouth daily. 30 tablet 2   prochlorperazine (COMPAZINE) 10 MG tablet Take 1 tablet (10 mg total) by mouth every 6 (six) hours as needed (Nausea or vomiting). 60  tablet 1   SENNA PLUS 8.6-50 MG tablet Take 1 tablet by mouth at bedtime.     traMADol (ULTRAM) 50 MG tablet Take 50 mg by mouth every 6 (six) hours as needed.     ziprasidone (GEODON) 80 MG capsule Take 80 mg by mouth at bedtime.     doxycycline (VIBRA-TABS) 100 MG tablet Take 1 tablet (100 mg total) by mouth 2 (two) times daily. 56 tablet 0   LYRICA 25 MG capsule Take 3 capsule by mouth twice a day  for nerve pain (for respite) STOP MED 4.5.22 (Patient not taking: No sig reported)     LYRICA 75 MG capsule Take 1 capsule by mouth twice a day  for nerve pain (Patient not taking: No sig reported)  No current facility-administered medications for this visit.    OBJECTIVE: Vitals:   03/10/21 1044  BP: 130/75  Pulse: 72  Temp: (!) 97.3 F (36.3 C)     Body mass index is 44.13 kg/m.    ECOG FS:0 - Asymptomatic  General: Well-developed, well-nourished, no acute distress. Eyes: Pink conjunctiva, anicteric sclera. HEENT: Normocephalic, moist mucous membranes. Lungs: No audible wheezing or coughing. Heart: Regular rate and rhythm. Abdomen: Soft, nontender, no obvious distention. Musculoskeletal: No edema, cyanosis, or clubbing. Neuro: Alert, answering all questions appropriately. Cranial nerves grossly intact. Skin: No rashes or petechiae noted. Psych: Normal affect.   LAB RESULTS:  Lab Results  Component Value Date   NA 135 03/10/2021   K 3.8 03/10/2021   CL 99 03/10/2021   CO2 27 03/10/2021   GLUCOSE 114 (H) 03/10/2021   BUN 14 03/10/2021   CREATININE 1.06 (H) 03/10/2021   CALCIUM 8.5 (L) 03/10/2021   PROT 7.1 03/10/2021   ALBUMIN 3.8 03/10/2021   AST 31 03/10/2021   ALT 27 03/10/2021   ALKPHOS 101 03/10/2021   BILITOT 0.5 03/10/2021   GFRNONAA 60 (L) 03/10/2021   GFRAA >60 01/30/2020    Lab Results  Component Value Date   WBC 5.5 03/10/2021   NEUTROABS 3.8 03/10/2021   HGB 7.8 (L) 03/10/2021   HCT 25.3 (L) 03/10/2021   MCV 93.7 03/10/2021   PLT 85 (L)  03/10/2021     STUDIES: No results found.  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  PLAN:    1.  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  Patient also noted to have a right iliac bony hypermetabolism as well as a soft tissue mass adjacent suspicious for isolated metastasis.  Soft tissue mass was biopsied on May 19, 2020 which was negative for malignancy.  Patient underwent resection on July 12, 2020 which revealed 4 positive lymph nodes.  MRI of the brain on August 25, 2020 for metastatic disease.  She completed adjuvant cisplatin and pemetrexed on Oct 28, 2020.  Repeat PET scan results from January 27, 2021 reviewed independently with progressive disease with new metastatic adenopathy in the chest, liver metastasis, and multifocal bone metastasis.  Patient wishes to continue with aggressive chemotherapy and given her EGFR mutation was initiated on Tagrisso in September 2022.  We will continue treatment until intolerable side effects or progression of disease.  Continue current dose despite anemia and thrombocytopenia.  Return to clinic in 2 weeks for laboratory work only and in 4 weeks for laboratory work and further evaluation.  Appreciate clinical pharmacy input.    2.  Anemia: Hemoglobin continues to trend down and is now 7.8, monitor. 3.  Hyperglycemia: Patient has improved blood glucose control.  Continue monitoring per primary care. 4.  Neuropathy: Continue Lyrica as prescribed. 5.  Thrombocytopenia: Likely secondary to [place], monitor.  Repeat laboratory work as above. 6.  Bony lesions: Consider adding Zometa to treatment regimen in the near future.   Patient  expressed understanding and was in agreement with this plan. She also understands that She can call clinic at any time with any questions, concerns, or complaints.   Cancer Staging Primary adenocarcinoma of lower lobe of left lung (  [place]) Staging form: Lung, AJCC 8th Edition - Pathologic stage from 07/27/2020: Stage IVB (pT2b, pN1, cM1c) - Signed by Lloyd Huger, MD on 02/02/2021   Lloyd Huger, MD   03/12/2021 7:39 AM

## 2021-03-10 ENCOUNTER — Inpatient Hospital Stay: Payer: Medicaid Other | Admitting: Pharmacist

## 2021-03-10 ENCOUNTER — Inpatient Hospital Stay (HOSPITAL_BASED_OUTPATIENT_CLINIC_OR_DEPARTMENT_OTHER): Payer: Medicaid Other | Admitting: Oncology

## 2021-03-10 ENCOUNTER — Encounter: Payer: Self-pay | Admitting: Oncology

## 2021-03-10 ENCOUNTER — Inpatient Hospital Stay: Payer: Medicaid Other | Attending: Oncology

## 2021-03-10 VITALS — BP 130/75 | HR 72 | Temp 97.3°F | Wt 241.3 lb

## 2021-03-10 DIAGNOSIS — C3432 Malignant neoplasm of lower lobe, left bronchus or lung: Secondary | ICD-10-CM

## 2021-03-10 DIAGNOSIS — D649 Anemia, unspecified: Secondary | ICD-10-CM | POA: Diagnosis not present

## 2021-03-10 DIAGNOSIS — M899 Disorder of bone, unspecified: Secondary | ICD-10-CM | POA: Diagnosis not present

## 2021-03-10 DIAGNOSIS — G629 Polyneuropathy, unspecified: Secondary | ICD-10-CM | POA: Diagnosis not present

## 2021-03-10 DIAGNOSIS — Z87891 Personal history of nicotine dependence: Secondary | ICD-10-CM | POA: Insufficient documentation

## 2021-03-10 DIAGNOSIS — Z95828 Presence of other vascular implants and grafts: Secondary | ICD-10-CM

## 2021-03-10 DIAGNOSIS — C7951 Secondary malignant neoplasm of bone: Secondary | ICD-10-CM | POA: Insufficient documentation

## 2021-03-10 DIAGNOSIS — C787 Secondary malignant neoplasm of liver and intrahepatic bile duct: Secondary | ICD-10-CM | POA: Insufficient documentation

## 2021-03-10 DIAGNOSIS — E1165 Type 2 diabetes mellitus with hyperglycemia: Secondary | ICD-10-CM | POA: Insufficient documentation

## 2021-03-10 DIAGNOSIS — D696 Thrombocytopenia, unspecified: Secondary | ICD-10-CM | POA: Insufficient documentation

## 2021-03-10 DIAGNOSIS — Z8673 Personal history of transient ischemic attack (TIA), and cerebral infarction without residual deficits: Secondary | ICD-10-CM | POA: Insufficient documentation

## 2021-03-10 LAB — COMPREHENSIVE METABOLIC PANEL
ALT: 27 U/L (ref 0–44)
AST: 31 U/L (ref 15–41)
Albumin: 3.8 g/dL (ref 3.5–5.0)
Alkaline Phosphatase: 101 U/L (ref 38–126)
Anion gap: 9 (ref 5–15)
BUN: 14 mg/dL (ref 8–23)
CO2: 27 mmol/L (ref 22–32)
Calcium: 8.5 mg/dL — ABNORMAL LOW (ref 8.9–10.3)
Chloride: 99 mmol/L (ref 98–111)
Creatinine, Ser: 1.06 mg/dL — ABNORMAL HIGH (ref 0.44–1.00)
GFR, Estimated: 60 mL/min — ABNORMAL LOW (ref >60)
Glucose, Bld: 114 mg/dL — ABNORMAL HIGH (ref 70–99)
Potassium: 3.8 mmol/L (ref 3.5–5.1)
Sodium: 135 mmol/L (ref 135–145)
Total Bilirubin: 0.5 mg/dL (ref 0.3–1.2)
Total Protein: 7.1 g/dL (ref 6.5–8.1)

## 2021-03-10 LAB — CBC WITH DIFFERENTIAL/PLATELET
Abs Immature Granulocytes: 0.04 10*3/uL (ref 0.00–0.07)
Basophils Absolute: 0 10*3/uL (ref 0.0–0.1)
Basophils Relative: 0 %
Eosinophils Absolute: 0 10*3/uL (ref 0.0–0.5)
Eosinophils Relative: 1 %
HCT: 25.3 % — ABNORMAL LOW (ref 36.0–46.0)
Hemoglobin: 7.8 g/dL — ABNORMAL LOW (ref 12.0–15.0)
Immature Granulocytes: 1 %
Lymphocytes Relative: 26 %
Lymphs Abs: 1.4 10*3/uL (ref 0.7–4.0)
MCH: 28.9 pg (ref 26.0–34.0)
MCHC: 30.8 g/dL (ref 30.0–36.0)
MCV: 93.7 fL (ref 80.0–100.0)
Monocytes Absolute: 0.3 10*3/uL (ref 0.1–1.0)
Monocytes Relative: 5 %
Neutro Abs: 3.8 10*3/uL (ref 1.7–7.7)
Neutrophils Relative %: 67 %
Platelets: 85 10*3/uL — ABNORMAL LOW (ref 150–400)
RBC: 2.7 MIL/uL — ABNORMAL LOW (ref 3.87–5.11)
RDW: 18 % — ABNORMAL HIGH (ref 11.5–15.5)
WBC: 5.5 10*3/uL (ref 4.0–10.5)
nRBC: 0.4 % — ABNORMAL HIGH (ref 0.0–0.2)

## 2021-03-10 LAB — MAGNESIUM: Magnesium: 1.7 mg/dL (ref 1.7–2.4)

## 2021-03-10 MED ORDER — HEPARIN SOD (PORK) LOCK FLUSH 100 UNIT/ML IV SOLN
INTRAVENOUS | Status: AC
  Filled 2021-03-10: qty 5

## 2021-03-10 MED ORDER — SODIUM CHLORIDE 0.9% FLUSH
10.0000 mL | Freq: Once | INTRAVENOUS | Status: AC
  Administered 2021-03-10: 10 mL via INTRAVENOUS
  Filled 2021-03-10: qty 10

## 2021-03-10 MED ORDER — HEPARIN SOD (PORK) LOCK FLUSH 100 UNIT/ML IV SOLN
500.0000 [IU] | Freq: Once | INTRAVENOUS | Status: AC
  Administered 2021-03-10: 500 [IU] via INTRAVENOUS
  Filled 2021-03-10: qty 5

## 2021-03-10 MED ORDER — DOXYCYCLINE HYCLATE 100 MG PO TABS
100.0000 mg | ORAL_TABLET | Freq: Two times a day (BID) | ORAL | 0 refills | Status: DC
Start: 2021-03-10 — End: 2021-08-09

## 2021-03-10 NOTE — Progress Notes (Signed)
Patient has rash on her face that started 2 days ago that does not itch or hurt.  Also noticed that her scalp is sore to the touch.  Does have lower back pain that is under control on current medication.  Had diarrhea for 2 days that was relived by OTC Imodium.

## 2021-03-10 NOTE — Progress Notes (Signed)
[facility]  [phone] [phone]  Patient Care Team: Center, [facility] as PCP - General [place], [place] as Oncology Nurse Navigator   Name of the patient: Natalie [name]  [id number]  July 21, 1960   Date of visit: 03/10/21  HPI: Patient is a 60 y.o. 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 Currently treated with Tagrisso (osimertinib), which she started on 02/19/21.   Reason for Consult: Oral chemotherapy follow-up for osimertinib therapy.   PAST MEDICAL HISTORY: Past Medical History:  Diagnosis Date   Asthma    COPD (chronic obstructive pulmonary disease) ([place])    CVA (cerebral vascular accident) ([place]) 2014   Depression    Diabetes mellitus ([place])    Diabetes mellitus without complication ([place])    Hepatitis C    History of kidney stones    Hyperlipidemia    Iron deficiency anemia    Obesity    OSA on CPAP    Mild, noncompliant with CPAP said cpap machine was recalled, is waiting for a replacement    Paranoia ([place])    Schizoaffective disorder, bipolar type [facility])     HEMATOLOGY/ONCOLOGY HISTORY:  Oncology History  Primary adenocarcinoma of lower lobe of left lung ([place])  05/08/2020 Initial Diagnosis   Primary adenocarcinoma of lower lobe of left lung ([place])   07/27/2020 Cancer Staging   Staging form: Lung, AJCC 8th Edition - Pathologic stage from 07/27/2020: Stage IVB (pT2b, pN1, cM1c) - Signed by Lloyd Huger, MD on 02/02/2021   08/26/2020 -  Chemotherapy    Patient is on Treatment Plan: LUNG NSCLC PEMETREXED (ALIMTA) + CISPLATIN Q21D X 4 CYCLES         ALLERGIES:  has No Known Allergies.  MEDICATIONS:  Current Outpatient Medications  Medication Sig Dispense Refill   acetaminophen (TYLENOL) 650 MG CR tablet Take 650 mg by mouth 2 (two) times daily.     albuterol (VENTOLIN HFA) 108 (90 Base) MCG/ACT inhaler Inhale 2  puffs into the lungs every 6 (six) hours as needed for wheezing or shortness of breath.     ASPERCREME ORIGINAL 10 % cream Apply topically.     atorvastatin (LIPITOR) 40 MG tablet Take 40 mg by mouth at bedtime.     benztropine (COGENTIN) 1 MG tablet Take 1 mg by mouth at bedtime.     citalopram (CELEXA) 20 MG tablet Take 20 mg by mouth every morning.     Dulaglutide (TRULICITY) 1.5 OV/5.6EP SOPN Inject 1.5 mg into the skin every Friday.     Fluticasone-Umeclidin-Vilant (TRELEGY ELLIPTA) 100-62.5-25 MCG/INH AEPB Inhale 1 puff into the lungs daily.     folic acid (FOLVITE) 1 MG tablet Take 1 tablet (1 mg total) by mouth daily. Start 5-7 days before Alimta chemotherapy. Continue until 21 days after Alimta completed. 100 tablet 3   gabapentin (NEURONTIN) 300 MG capsule Take 300 mg by mouth 2 (two) times daily.     insulin detemir (LEVEMIR) 100 UNIT/ML injection Inject 30 Units into the skin daily.     latanoprost (XALATAN) 0.005 % ophthalmic solution Place 1 drop into both eyes daily.     lidocaine (LIDODERM) 5 % Place 2 patches onto the skin See admin instructions. Apply 1 patch to each foot every 12 hours     lidocaine-prilocaine (EMLA) cream Apply to affected area once 30 g 3   LORazepam (ATIVAN) 1 MG tablet Take 1 mg by mouth 2 (two) times daily.  LYRICA 100 MG capsule Take 100 mg by mouth 2 (two) times daily.     LYRICA 25 MG capsule Take 3 capsule by mouth twice a day  for nerve pain (for respite) STOP MED 4.5.22 (Patient not taking: Reported on 02/01/2021)     LYRICA 75 MG capsule Take 1 capsule by mouth twice a day  for nerve pain (Patient not taking: Reported on 02/01/2021)     ondansetron (ZOFRAN) 8 MG tablet Take 1 tablet (8 mg total) by mouth 2 (two) times daily as needed (Nausea or vomiting). Start if needed on the third day after cisplatin. 60 tablet 1   osimertinib mesylate (TAGRISSO) 80 MG tablet Take 1 tablet (80 mg total) by mouth daily. 30 tablet 2   prochlorperazine (COMPAZINE)  10 MG tablet Take 1 tablet (10 mg total) by mouth every 6 (six) hours as needed (Nausea or vomiting). 60 tablet 1   SENNA PLUS 8.6-50 MG tablet Take 1 tablet by mouth at bedtime.     traMADol (ULTRAM) 50 MG tablet Take 50 mg by mouth every 6 (six) hours as needed.     ziprasidone (GEODON) 80 MG capsule Take 80 mg by mouth at bedtime.     No current facility-administered medications for this visit.    VITAL SIGNS: There were no vitals taken for this visit. There were no vitals filed for this visit.  Estimated body mass index is 42.8 kg/m as calculated from the following:   Height as of 01/26/21: 5' 2"  (1.575 m).   Weight as of 02/01/21: 106.1 kg (234 lb).  LABS: CBC:    Component Value Date/Time   WBC 5.8 01/26/2021 0822   HGB 8.8 (L) 01/26/2021 0822   HCT 28.5 (L) 01/26/2021 0822   PLT 197 01/26/2021 0822   MCV 99.3 01/26/2021 0822   NEUTROABS 3.4 01/26/2021 0822   LYMPHSABS 2.1 01/26/2021 0822   MONOABS 0.3 01/26/2021 0822   EOSABS 0.0 01/26/2021 0822   BASOSABS 0.0 01/26/2021 0822   Comprehensive Metabolic Panel:    Component Value Date/Time   NA 137 01/26/2021 0822   K 3.4 (L) 01/26/2021 0822   CL 101 01/26/2021 0822   CO2 24 01/26/2021 0822   BUN 16 01/26/2021 0822   CREATININE 1.04 (H) 01/26/2021 0822   GLUCOSE 131 (H) 01/26/2021 0822   CALCIUM 8.6 (L) 01/26/2021 0822   AST 104 (H) 01/26/2021 0822   ALT 84 (H) 01/26/2021 0822   ALKPHOS 103 01/26/2021 0822   BILITOT 0.7 01/26/2021 0822   PROT 7.1 01/26/2021 0822   ALBUMIN 4.0 01/26/2021 1601     Present during today's visit: patient only  Assessment and Plan: CBC/CMP reviewed with patient, K now wnl. Pltc has decreased with treatment from 197K/uL to 85K/uL. Not requiring a dose adjustment at this time, pt will RTC in 2 weeks for lab check only. Patient reports that [facility] is checking ECGs weekly, pretreatment and first post treatment ECG copies were obtained from [facility] Continue osimertinib 66m daily   Oral  Chemotherapy Side Effect/Intolerance:  Rash: Ms. HScripterpresents today with a acne-like rash on her face and scalp tenderness, mild-moderate, she will start doxycycline, topical face and scalp treatment will be added if needed Diarrhea: she reports having diarrhea since starting, but she was able to manage with about 2 days of taking loperamide prn Shortness of breath: she shortness of breath when she takes the stairs, this would improve with continued treatment and cancer response. Encouraged her to continue to take  the stairs and walk/exercise as she is currently doing. No reported nail changes, decrease in appetite, or fatigue  Oral Chemotherapy Adherence: no missed doses since starting No patient barriers to medication adherence identified.   New medications: none reported  Medication Access Issues: [facility] her medication from [facility], suggested she reach out to [facility] about getting a refill prior to running out  Patient expressed understanding and was in agreement with this plan. She also understands that She can call clinic at any time with any questions, concerns, or complaints.   Follow-up plan: RTC in 2 weeks lab only, 4 week lab and appt  Thank you for allowing me to participate in the care of this very pleasant patient.   Time Total: 15 mins  Visit consisted of counseling and education on dealing with issues of symptom management in the setting of serious and potentially life-threatening illness.Greater than 50%  of this time was spent counseling and coordinating care related to the above assessment and plan.  Signed by: Darl Pikes, PharmD, BCPS, Salley Slaughter, CPP Hematology/Oncology Clinical Pharmacist Practitioner [facility] [phone]  03/10/2021 10:13 AM

## 2021-03-12 ENCOUNTER — Encounter: Payer: Self-pay | Admitting: Oncology

## 2021-03-15 ENCOUNTER — Encounter: Payer: Self-pay | Admitting: Oncology

## 2021-03-24 ENCOUNTER — Inpatient Hospital Stay: Payer: Medicaid Other | Attending: Oncology

## 2021-03-24 DIAGNOSIS — D696 Thrombocytopenia, unspecified: Secondary | ICD-10-CM | POA: Diagnosis not present

## 2021-03-24 DIAGNOSIS — Z87891 Personal history of nicotine dependence: Secondary | ICD-10-CM | POA: Diagnosis not present

## 2021-03-24 DIAGNOSIS — D649 Anemia, unspecified: Secondary | ICD-10-CM | POA: Insufficient documentation

## 2021-03-24 DIAGNOSIS — C771 Secondary and unspecified malignant neoplasm of intrathoracic lymph nodes: Secondary | ICD-10-CM | POA: Diagnosis not present

## 2021-03-24 DIAGNOSIS — N289 Disorder of kidney and ureter, unspecified: Secondary | ICD-10-CM | POA: Insufficient documentation

## 2021-03-24 DIAGNOSIS — E1165 Type 2 diabetes mellitus with hyperglycemia: Secondary | ICD-10-CM | POA: Insufficient documentation

## 2021-03-24 DIAGNOSIS — G629 Polyneuropathy, unspecified: Secondary | ICD-10-CM | POA: Diagnosis not present

## 2021-03-24 DIAGNOSIS — C3432 Malignant neoplasm of lower lobe, left bronchus or lung: Secondary | ICD-10-CM | POA: Insufficient documentation

## 2021-03-24 DIAGNOSIS — C7931 Secondary malignant neoplasm of brain: Secondary | ICD-10-CM | POA: Insufficient documentation

## 2021-03-24 DIAGNOSIS — C7951 Secondary malignant neoplasm of bone: Secondary | ICD-10-CM | POA: Insufficient documentation

## 2021-03-24 DIAGNOSIS — Z95828 Presence of other vascular implants and grafts: Secondary | ICD-10-CM

## 2021-03-24 DIAGNOSIS — C787 Secondary malignant neoplasm of liver and intrahepatic bile duct: Secondary | ICD-10-CM | POA: Diagnosis not present

## 2021-03-24 LAB — COMPREHENSIVE METABOLIC PANEL
ALT: 26 U/L (ref 0–44)
AST: 26 U/L (ref 15–41)
Albumin: 3.7 g/dL (ref 3.5–5.0)
Alkaline Phosphatase: 125 U/L (ref 38–126)
Anion gap: 8 (ref 5–15)
BUN: 22 mg/dL (ref 8–23)
CO2: 25 mmol/L (ref 22–32)
Calcium: 8.6 mg/dL — ABNORMAL LOW (ref 8.9–10.3)
Chloride: 102 mmol/L (ref 98–111)
Creatinine, Ser: 1.3 mg/dL — ABNORMAL HIGH (ref 0.44–1.00)
GFR, Estimated: 47 mL/min — ABNORMAL LOW (ref >60)
Glucose, Bld: 116 mg/dL — ABNORMAL HIGH (ref 70–99)
Potassium: 4 mmol/L (ref 3.5–5.1)
Sodium: 135 mmol/L (ref 135–145)
Total Bilirubin: 0.5 mg/dL (ref 0.3–1.2)
Total Protein: 7.1 g/dL (ref 6.5–8.1)

## 2021-03-24 LAB — CBC WITH DIFFERENTIAL/PLATELET
Abs Immature Granulocytes: 0.02 10*3/uL (ref 0.00–0.07)
Basophils Absolute: 0 10*3/uL (ref 0.0–0.1)
Basophils Relative: 0 %
Eosinophils Absolute: 0 10*3/uL (ref 0.0–0.5)
Eosinophils Relative: 1 %
HCT: 27 % — ABNORMAL LOW (ref 36.0–46.0)
Hemoglobin: 8.5 g/dL — ABNORMAL LOW (ref 12.0–15.0)
Immature Granulocytes: 0 %
Lymphocytes Relative: 22 %
Lymphs Abs: 1.3 10*3/uL (ref 0.7–4.0)
MCH: 29.3 pg (ref 26.0–34.0)
MCHC: 31.5 g/dL (ref 30.0–36.0)
MCV: 93.1 fL (ref 80.0–100.0)
Monocytes Absolute: 0.2 10*3/uL (ref 0.1–1.0)
Monocytes Relative: 4 %
Neutro Abs: 4.1 10*3/uL (ref 1.7–7.7)
Neutrophils Relative %: 73 %
Platelets: 101 10*3/uL — ABNORMAL LOW (ref 150–400)
RBC: 2.9 MIL/uL — ABNORMAL LOW (ref 3.87–5.11)
RDW: 17.6 % — ABNORMAL HIGH (ref 11.5–15.5)
WBC: 5.7 10*3/uL (ref 4.0–10.5)
nRBC: 0 % (ref 0.0–0.2)

## 2021-03-24 LAB — MAGNESIUM: Magnesium: 1.3 mg/dL — ABNORMAL LOW (ref 1.7–2.4)

## 2021-03-24 MED ORDER — HEPARIN SOD (PORK) LOCK FLUSH 100 UNIT/ML IV SOLN
500.0000 [IU] | Freq: Once | INTRAVENOUS | Status: AC
  Filled 2021-03-24: qty 5

## 2021-03-24 MED ORDER — HEPARIN SOD (PORK) LOCK FLUSH 100 UNIT/ML IV SOLN
INTRAVENOUS | Status: AC
  Administered 2021-03-24: 500 [IU]
  Filled 2021-03-24: qty 5

## 2021-03-24 MED ORDER — SODIUM CHLORIDE 0.9% FLUSH
10.0000 mL | INTRAVENOUS | Status: DC | PRN
  Administered 2021-03-24: 10 mL via INTRAVENOUS
  Filled 2021-03-24: qty 10

## 2021-04-04 NOTE — Progress Notes (Signed)
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   INTERVAL HISTORY: Patient returns to clinic today for repeat laboratory work and further evaluation.  She is tolerating Tagrisso well with minimal side effects.  The rash on her scalp has resolved with doxycycline, but she still has residual rash on her upper torso.  She otherwise feels well and is asymptomatic.  She has no neurologic complaints.  She denies any recent fevers or illnesses.  She has a good appetite and denies weight loss.  She has no chest pain, shortness of breath, cough, or hemoptysis.  She denies any nausea, vomiting, constipation, or diarrhea.  She has no urinary complaints.  Patient offers no further specific complaints today.  REVIEW OF SYSTEMS:   Review of Systems  Constitutional: Negative.  Negative for fever, malaise/fatigue and weight loss.  Respiratory: Negative.  Negative for cough, hemoptysis and shortness of breath.   Cardiovascular: Negative.  Negative for chest pain and leg swelling.  Gastrointestinal: Negative.  Negative for abdominal pain.  Genitourinary: Negative.  Negative for dysuria and flank pain.  Musculoskeletal: Negative.  Negative for back pain and joint pain.  Skin:  Positive for rash.  Neurological: Negative.  Negative for dizziness, focal weakness, weakness and headaches.  Psychiatric/Behavioral: Negative.  The patient is not nervous/anxious.    As per HPI. Otherwise, a complete review of systems is negative.  PAST MEDICAL HISTORY: Past Medical History:  Diagnosis Date   Asthma    COPD (chronic obstructive pulmonary disease) ([place])    CVA (cerebral vascular accident) ([place]) 2014    Depression    Diabetes mellitus ([place])    Diabetes mellitus without complication ([place])    Hepatitis C    History of kidney stones    Hyperlipidemia    Iron deficiency anemia    Obesity    OSA on CPAP    Mild, noncompliant with CPAP said cpap machine was recalled, is waiting for a replacement    Paranoia ([place])    Schizoaffective disorder, bipolar type ([place])     PAST SURGICAL HISTORY: Past Surgical History:  Procedure Laterality Date   BREAST CYST EXCISION Bilateral 2013   Benign   CESAREAN SECTION     CHOLECYSTECTOMY     COLONOSCOPY     ESOPHAGOGASTRODUODENOSCOPY  2020   done in [place] Left 2002   INTERCOSTAL NERVE BLOCK N/A 07/12/2020   Procedure: INTERCOSTAL NERVE BLOCK;  Surgeon: Melrose Nakayama, MD;  Location: [place];  Service: Thoracic;  Laterality: N/A;   IR IMAGING GUIDED PORT INSERTION  08/20/2020   LYMPH NODE DISSECTION Left 07/12/2020   Procedure: LYMPH NODE DISSECTION;  Surgeon: Melrose Nakayama, MD;  Location: [place];  Service: Thoracic;  Laterality: Left;   UMBILICAL HERNIA REPAIR     VIDEO BRONCHOSCOPY WITH ENDOBRONCHIAL NAVIGATION N/A 06/07/2020   Procedure: VIDEO BRONCHOSCOPY WITH ENDOBRONCHIAL NAVIGATION;  Surgeon: Tyler Pita, MD;  Location: [facility];  Service: Pulmonary;  Laterality: N/A;    FAMILY HISTORY: No family history on file.  ADVANCED DIRECTIVES (Y/N):  N  HEALTH MAINTENANCE: Social History   Tobacco Use   Smoking status: Former    Packs/day: 2.00    Years: 38.00    Pack  years: 76.00    Types: Cigarettes    Quit date: 2012    Years since quitting: 10.8   Smokeless tobacco: Former  Scientific laboratory technician Use: Never used  Substance Use Topics   Alcohol use: Not Currently    Comment: occassionaly   Drug use: Not Currently     Colonoscopy:  PAP:  Bone density:  Lipid panel:  No Known Allergies  Current Outpatient Medications  Medication Sig Dispense Refill   acetaminophen (TYLENOL) 650 MG CR  tablet Take 650 mg by mouth 2 (two) times daily.     albuterol (VENTOLIN HFA) 108 (90 Base) MCG/ACT inhaler Inhale 2 puffs into the lungs every 6 (six) hours as needed for wheezing or shortness of breath.     ASPERCREME ORIGINAL 10 % cream Apply topically.     atorvastatin (LIPITOR) 40 MG tablet Take 40 mg by mouth at bedtime.     benztropine (COGENTIN) 1 MG tablet Take 1 mg by mouth at bedtime.     citalopram (CELEXA) 20 MG tablet Take 20 mg by mouth every morning.     Dulaglutide (TRULICITY) 1.5 ZO/1.0RU SOPN Inject 1.5 mg into the skin every Friday.     Fluticasone-Umeclidin-Vilant (TRELEGY ELLIPTA) 100-62.5-25 MCG/INH AEPB Inhale 1 puff into the lungs daily.     gabapentin (NEURONTIN) 300 MG capsule Take 300 mg by mouth 2 (two) times daily.     insulin detemir (LEVEMIR) 100 UNIT/ML injection Inject 30 Units into the skin daily.     latanoprost (XALATAN) 0.005 % ophthalmic solution Place 1 drop into both eyes daily.     lidocaine (LIDODERM) 5 % Place 2 patches onto the skin See admin instructions. Apply 1 patch to each foot every 12 hours     Loperamide HCl (IMODIUM PO) SMARTSIG:1 Capsule(s) By Mouth 1-2 Times Daily PRN     LORazepam (ATIVAN) 1 MG tablet Take 1 mg by mouth 2 (two) times daily.     LYRICA 100 MG capsule Take 100 mg by mouth 2 (two) times daily.     osimertinib mesylate (TAGRISSO) 80 MG tablet Take 1 tablet (80 mg total) by mouth daily. 30 tablet 2   prochlorperazine (COMPAZINE) 10 MG tablet Take 1 tablet (10 mg total) by mouth every 6 (six) hours as needed (Nausea or vomiting). 60 tablet 1   SENNA PLUS 8.6-50 MG tablet Take 1 tablet by mouth at bedtime.     traMADol (ULTRAM) 50 MG tablet Take 50 mg by mouth every 6 (six) hours as needed.     ziprasidone (GEODON) 80 MG capsule Take 80 mg by mouth at bedtime.     doxycycline (VIBRA-TABS) 100 MG tablet Take 1 tablet (100 mg total) by mouth 2 (two) times daily. (Patient not taking: Reported on 04/07/2021) 56 tablet 0   LYRICA 25  MG capsule Take 3 capsule by mouth twice a day  for nerve pain (for respite) STOP MED 4.5.22 (Patient not taking: No sig reported)     LYRICA 75 MG capsule Take 1 capsule by mouth twice a day  for nerve pain (Patient not taking: No sig reported)     No current facility-administered medications for this visit.    OBJECTIVE: Vitals:   04/07/21 1007  BP: (!) 141/50  Pulse: 80  Resp: 16  Temp: 97.8 F (36.6 C)  SpO2: 96%     Body mass index is 42.51 kg/m.    ECOG FS:0 - Asymptomatic  General: Well-developed, well-nourished, no acute distress. Eyes:  Pink conjunctiva, anicteric sclera. HEENT: Normocephalic, moist mucous membranes. Lungs: No audible wheezing or coughing. Heart: Regular rate and rhythm. Abdomen: Soft, nontender, no obvious distention. Musculoskeletal: No edema, cyanosis, or clubbing. Neuro: Alert, answering all questions appropriately. Cranial nerves grossly intact. Skin: Mild maculopapular rash noted on upper torso. Psych: Normal affect.   LAB RESULTS:  Lab Results  Component Value Date   NA 138 04/07/2021   K 4.4 04/07/2021   CL 103 04/07/2021   CO2 24 04/07/2021   GLUCOSE 86 04/07/2021   BUN 21 04/07/2021   CREATININE 1.37 (H) 04/07/2021   CALCIUM 9.4 04/07/2021   PROT 7.5 04/07/2021   ALBUMIN 3.9 04/07/2021   AST 44 (H) 04/07/2021   ALT 40 04/07/2021   ALKPHOS 130 (H) 04/07/2021   BILITOT 0.8 04/07/2021   GFRNONAA 44 (L) 04/07/2021   GFRAA >60 01/30/2020    Lab Results  Component Value Date   WBC 4.8 04/07/2021   NEUTROABS 3.3 04/07/2021   HGB 9.3 (L) 04/07/2021   HCT 30.1 (L) 04/07/2021   MCV 92.9 04/07/2021   PLT 78 (L) 04/07/2021     STUDIES: No results found.  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  PLAN:    1.  Progressive stage IV non-small cell carcinoma of the left lower lobe  lung: PET scan results from May 06, 2020 reviewed independently with hypermetabolic left lower lobe lung mass and biopsy confirmed malignancy.  Patient also noted to have a right iliac bony hypermetabolism as well as a soft tissue mass adjacent suspicious for isolated metastasis.  Soft tissue mass was biopsied on May 19, 2020 which was negative for malignancy.  Patient underwent resection on July 12, 2020 which revealed 4 positive lymph nodes.  MRI of the brain on August 25, 2020 for metastatic disease.  She completed adjuvant cisplatin and pemetrexed on Oct 28, 2020.  Repeat PET scan results from January 27, 2021 reviewed independently with progressive disease with new metastatic adenopathy in the chest, liver metastasis, and multifocal bone metastasis.  Patient wishes to continue with aggressive chemotherapy and given her EGFR mutation was initiated on Tagrisso in September 2022.  Continue treatment until intolerable side effects or progression of disease.  Continue current dose of Tagrisso despite anemia and thrombocytopenia.  Return to clinic in 2 weeks for laboratory work only and then in 4 weeks for laboratory work and further evaluation.  Appreciate clinical pharmacy input.      2.  Anemia: Hemoglobin is now trending up and is 9.3.  Monitor.   3.  Hyperglycemia: Patient has improved blood glucose control.  Continue monitoring per primary care. 4.  Neuropathy: Continue Lyrica as prescribed. 5.  Thrombocytopenia: Decreased, but essentially stable with platelet count of 78.  Monitor. 6.  Bony lesions: Consider adding Zometa to treatment regimen in the near future. 7.  Renal insufficiency: Patient's creatinine has trended up to 1.37, monitor.   Patient expressed understanding and was in agreement with this plan. She also understands that She can call clinic at any time with any questions, concerns, or complaints.   Cancer Staging Primary adenocarcinoma of lower lobe of left lung  [facility]) Staging form: Lung, AJCC 8th Edition - Pathologic stage from 07/27/2020: Stage IVB (pT2b, pN1, cM1c) - Signed by Lloyd Huger, MD on 02/02/2021   Lloyd Huger, MD   04/07/2021  10:45 AM

## 2021-04-07 ENCOUNTER — Other Ambulatory Visit: Payer: Self-pay

## 2021-04-07 ENCOUNTER — Inpatient Hospital Stay: Payer: Medicaid Other | Admitting: Pharmacist

## 2021-04-07 ENCOUNTER — Inpatient Hospital Stay: Payer: Medicaid Other

## 2021-04-07 ENCOUNTER — Inpatient Hospital Stay (HOSPITAL_BASED_OUTPATIENT_CLINIC_OR_DEPARTMENT_OTHER): Payer: Medicaid Other | Admitting: Oncology

## 2021-04-07 VITALS — BP 141/50 | HR 80 | Temp 97.8°F | Resp 16 | Wt 232.4 lb

## 2021-04-07 DIAGNOSIS — C3432 Malignant neoplasm of lower lobe, left bronchus or lung: Secondary | ICD-10-CM | POA: Diagnosis not present

## 2021-04-07 LAB — CBC WITH DIFFERENTIAL/PLATELET
Abs Immature Granulocytes: 0.02 10*3/uL (ref 0.00–0.07)
Basophils Absolute: 0 10*3/uL (ref 0.0–0.1)
Basophils Relative: 0 %
Eosinophils Absolute: 0 10*3/uL (ref 0.0–0.5)
Eosinophils Relative: 1 %
HCT: 30.1 % — ABNORMAL LOW (ref 36.0–46.0)
Hemoglobin: 9.3 g/dL — ABNORMAL LOW (ref 12.0–15.0)
Immature Granulocytes: 0 %
Lymphocytes Relative: 26 %
Lymphs Abs: 1.3 10*3/uL (ref 0.7–4.0)
MCH: 28.7 pg (ref 26.0–34.0)
MCHC: 30.9 g/dL (ref 30.0–36.0)
MCV: 92.9 fL (ref 80.0–100.0)
Monocytes Absolute: 0.2 10*3/uL (ref 0.1–1.0)
Monocytes Relative: 4 %
Neutro Abs: 3.3 10*3/uL (ref 1.7–7.7)
Neutrophils Relative %: 69 %
Platelets: 78 10*3/uL — ABNORMAL LOW (ref 150–400)
RBC: 3.24 MIL/uL — ABNORMAL LOW (ref 3.87–5.11)
RDW: 16.5 % — ABNORMAL HIGH (ref 11.5–15.5)
WBC: 4.8 10*3/uL (ref 4.0–10.5)
nRBC: 0 % (ref 0.0–0.2)

## 2021-04-07 LAB — COMPREHENSIVE METABOLIC PANEL
ALT: 40 U/L (ref 0–44)
AST: 44 U/L — ABNORMAL HIGH (ref 15–41)
Albumin: 3.9 g/dL (ref 3.5–5.0)
Alkaline Phosphatase: 130 U/L — ABNORMAL HIGH (ref 38–126)
Anion gap: 11 (ref 5–15)
BUN: 21 mg/dL (ref 8–23)
CO2: 24 mmol/L (ref 22–32)
Calcium: 9.4 mg/dL (ref 8.9–10.3)
Chloride: 103 mmol/L (ref 98–111)
Creatinine, Ser: 1.37 mg/dL — ABNORMAL HIGH (ref 0.44–1.00)
GFR, Estimated: 44 mL/min — ABNORMAL LOW (ref >60)
Glucose, Bld: 86 mg/dL (ref 70–99)
Potassium: 4.4 mmol/L (ref 3.5–5.1)
Sodium: 138 mmol/L (ref 135–145)
Total Bilirubin: 0.8 mg/dL (ref 0.3–1.2)
Total Protein: 7.5 g/dL (ref 6.5–8.1)

## 2021-04-07 LAB — MAGNESIUM: Magnesium: 1.7 mg/dL (ref 1.7–2.4)

## 2021-04-07 MED ORDER — CLOBETASOL PROPIONATE 0.05 % EX CREA: TOPICAL_CREAM | CUTANEOUS | 1 refills | Status: AC

## 2021-04-07 NOTE — Progress Notes (Signed)
[place]  Telephone:([phone] Fax:(336) [phone]  Patient Care Team: Center, [facility] as PCP - General [place], [place] as Oncology Nurse Navigator   Name of the patient: Natalie [name]  [id number]  March 27, 1961   Date of visit: 04/07/21  HPI: Patient is a 60 y.o. 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 Currently treated with Tagrisso (osimertinib), which she started on 02/19/21.   Reason for Consult: Oral chemotherapy follow-up for osimertinib therapy.   PAST MEDICAL HISTORY: Past Medical History:  Diagnosis Date   Asthma    COPD (chronic obstructive pulmonary disease) ([place])    CVA (cerebral vascular accident) ([place]) 2014   Depression    Diabetes mellitus ([place])    Diabetes mellitus without complication ([place])    Hepatitis C    History of kidney stones    Hyperlipidemia    Iron deficiency anemia    Obesity    OSA on CPAP    Mild, noncompliant with CPAP said cpap machine was recalled, is waiting for a replacement    Paranoia ([place])    Schizoaffective disorder, bipolar type [facility])     HEMATOLOGY/ONCOLOGY HISTORY:  Oncology History  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  MEDICATIONS:  Current Outpatient Medications  Medication Sig Dispense Refill   acetaminophen (TYLENOL) 650 MG CR tablet Take 650 mg by mouth 2 (two) times daily.     albuterol (VENTOLIN HFA) 108 (90 Base) MCG/ACT inhaler Inhale 2  puffs into the lungs every 6 (six) hours as needed for wheezing or shortness of breath.     ASPERCREME ORIGINAL 10 % cream Apply topically.     atorvastatin (LIPITOR) 40 MG tablet Take 40 mg by mouth at bedtime.     benztropine (COGENTIN) 1 MG tablet Take 1 mg by mouth at bedtime.     citalopram (CELEXA) 20 MG tablet Take 20 mg by mouth every morning.     doxycycline (VIBRA-TABS) 100 MG tablet Take 1 tablet (100 mg total) by mouth 2 (two) times daily. 56 tablet 0   Dulaglutide (TRULICITY) 1.5 YI/5.0YD SOPN Inject 1.5 mg into the skin every Friday.     Fluticasone-Umeclidin-Vilant (TRELEGY ELLIPTA) 100-62.5-25 MCG/INH AEPB Inhale 1 puff into the lungs daily.     gabapentin (NEURONTIN) 300 MG capsule Take 300 mg by mouth 2 (two) times daily.     insulin detemir (LEVEMIR) 100 UNIT/ML injection Inject 30 Units into the skin daily.     latanoprost (XALATAN) 0.005 % ophthalmic solution Place 1 drop into both eyes daily.     lidocaine (LIDODERM) 5 % Place 2 patches onto the skin See admin instructions. Apply 1 patch to each foot every 12 hours     Loperamide HCl (IMODIUM PO) SMARTSIG:1 Capsule(s) By Mouth 1-2 Times Daily PRN     LORazepam (ATIVAN) 1 MG tablet Take 1 mg by mouth 2 (two) times daily.     LYRICA 100 MG capsule Take 100  mg by mouth 2 (two) times daily.     LYRICA 25 MG capsule Take 3 capsule by mouth twice a day  for nerve pain (for respite) STOP MED 4.5.22 (Patient not taking: No sig reported)     LYRICA 75 MG capsule Take 1 capsule by mouth twice a day  for nerve pain (Patient not taking: No sig reported)     osimertinib mesylate (TAGRISSO) 80 MG tablet Take 1 tablet (80 mg total) by mouth daily. 30 tablet 2   prochlorperazine (COMPAZINE) 10 MG tablet Take 1 tablet (10 mg total) by mouth every 6 (six) hours as needed (Nausea or vomiting). 60 tablet 1   SENNA PLUS 8.6-50 MG tablet Take 1 tablet by mouth at bedtime.     traMADol (ULTRAM) 50 MG tablet Take 50 mg by mouth every 6 (six) hours  as needed.     ziprasidone (GEODON) 80 MG capsule Take 80 mg by mouth at bedtime.     No current facility-administered medications for this visit.    VITAL SIGNS: There were no vitals taken for this visit. There were no vitals filed for this visit.  Estimated body mass index is 44.13 kg/m as calculated from the following:   Height as of 01/26/21: 5' 2"  (1.575 m).   Weight as of 03/10/21: 109.5 kg (241 lb 4.8 oz).  LABS: CBC:    Component Value Date/Time   WBC 5.7 03/24/2021 1053   HGB 8.5 (L) 03/24/2021 1053   HCT 27.0 (L) 03/24/2021 1053   PLT 101 (L) 03/24/2021 1053   MCV 93.1 03/24/2021 1053   NEUTROABS 4.1 03/24/2021 1053   LYMPHSABS 1.3 03/24/2021 1053   MONOABS 0.2 03/24/2021 1053   EOSABS 0.0 03/24/2021 1053   BASOSABS 0.0 03/24/2021 1053   Comprehensive Metabolic Panel:    Component Value Date/Time   NA 135 03/24/2021 1053   K 4.0 03/24/2021 1053   CL 102 03/24/2021 1053   CO2 25 03/24/2021 1053   BUN 22 03/24/2021 1053   CREATININE 1.30 (H) 03/24/2021 1053   GLUCOSE 116 (H) 03/24/2021 1053   CALCIUM 8.6 (L) 03/24/2021 1053   AST 26 03/24/2021 1053   ALT 26 03/24/2021 1053   ALKPHOS 125 03/24/2021 1053   BILITOT 0.5 03/24/2021 1053   PROT 7.1 03/24/2021 1053   ALBUMIN 3.7 03/24/2021 1053     Present during today's visit: patient only  Assessment and Plan: CBC/CMP reviewed, pltc continues to fluctuant, will monitor. RTC in 2 weeks for lab check only. [facility] program has been checking ECGs frequent, ECG from 03/21/21 showed QTc of 474m, wnl. From our stand point monitoring no longer need unless there is a change in patient's clinical status Continue osimertinib 872mdaily   Oral Chemotherapy Side Effect/Intolerance:  Rash: Acne-like rash on her face and scalp tenderness, resolved with doxycycline treatment. Patient reports having about 4 more days of doxycycline left. She reports having itching on her chest, will prescribe a topical steroid for her to apply  to chest area as needed. Diarrhea: occurs about once a week and requires about once a week loperamide Fatigue: fatigue has improved since starting treatment, she is able to cook for herself more often which is something her energy level made harder previously Edema: mild edema in ankles and feet, she is already elevating her feet when possible, she plans on asking [facility] for support stockings  Oral Chemotherapy Adherence: no missed doses since starting No patient barriers to medication adherence identified.   New medications:  none reported  Medication Access Issues: receives her medication from [facility], suggested she reach out to [facility] about getting a refill prior to running out  Patient expressed understanding and was in agreement with this plan. She also understands that She can call clinic at any time with any questions, concerns, or complaints.   Follow-up plan: RTC in 2 weeks lab only, 4 week lab and appt  Thank you for allowing me to participate in the care of this very pleasant patient.   Time Total: 15 mins  Visit consisted of counseling and education on dealing with issues of symptom management in the setting of serious and potentially life-threatening illness.Greater than 50%  of this time was spent counseling and coordinating care related to the above assessment and plan.  Signed by: Darl Pikes, PharmD, BCPS, Salley Slaughter, CPP Hematology/Oncology Clinical Pharmacist Practitioner [facility] [phone]  04/07/2021 9:56 AM

## 2021-04-07 NOTE — Progress Notes (Signed)
Pt reports some nausea last week but relieved by medication. Pt states she hasnt been eating well. 9 lb wt loss noted since last visit

## 2021-04-08 ENCOUNTER — Encounter: Payer: Self-pay | Admitting: Oncology

## 2021-04-18 ENCOUNTER — Telehealth: Payer: Self-pay | Admitting: Pharmacist

## 2021-04-18 ENCOUNTER — Encounter: Payer: Self-pay | Admitting: Family Medicine

## 2021-04-18 DIAGNOSIS — C3432 Malignant neoplasm of lower lobe, left bronchus or lung: Secondary | ICD-10-CM

## 2021-04-18 MED ORDER — OSIMERTINIB MESYLATE 40 MG PO TABS: 40.0000 mg | ORAL_TABLET | Freq: Every day | ORAL | 2 refills | Status: DC

## 2021-04-18 NOTE — Progress Notes (Signed)
Dr. Grayland Ormond reviewed labs received from outside facility today.  MD recommends patient to hold Tagrisso until next appt and then will dose reduce to 40mg .  Natalie [name] will contact patient.

## 2021-04-18 NOTE — Telephone Encounter (Signed)
Oral Chemotherapy Pharmacist Encounter   Labs faxed from [facility] (checked on 04/15/21) showed a decreased in plt count to 65K/uL. Dr. Grayland Ormond would like to hold osimertinib treatment until her follow-up appt on 11/16, then resume at 40mg  pending the results of her lab work.  Called Ms. Kinnamon to instruct her to hold her osimertinib until she RTC, she stated her understanding of the plan.    Darl Pikes, PharmD, BCPS, BCOP, CPP Hematology/Oncology Clinical Pharmacist [facility] [phone]  04/18/2021 4:11 PM

## 2021-04-21 ENCOUNTER — Inpatient Hospital Stay: Payer: Medicaid Other

## 2021-04-28 NOTE — Progress Notes (Signed)
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   INTERVAL HISTORY: Patient returns to clinic today for repeat laboratory work, further evaluation, and reinitiation of dose reduced aggressor.  She currently feels well and is asymptomatic.  She has no further rash.  She has no neurologic complaints.  She denies any recent fevers or illnesses.  She has a good appetite and denies weight loss.  She has no chest pain, shortness of breath, cough, or hemoptysis.  She denies any nausea, vomiting, constipation, or diarrhea.  She has no urinary complaints.  Patient offers no specific complaints today.  REVIEW OF SYSTEMS:   Review of Systems  Constitutional: Negative.  Negative for fever, malaise/fatigue and weight loss.  Respiratory: Negative.  Negative for cough, hemoptysis and shortness of breath.   Cardiovascular: Negative.  Negative for chest pain and leg swelling.  Gastrointestinal: Negative.  Negative for abdominal pain.  Genitourinary: Negative.  Negative for dysuria and flank pain.  Musculoskeletal: Negative.  Negative for back pain and joint pain.  Skin: Negative.  Negative for rash.  Neurological: Negative.  Negative for dizziness, focal weakness, weakness and headaches.  Psychiatric/Behavioral: Negative.  The patient is not nervous/anxious.    As per HPI. Otherwise, a complete review of systems is negative.  PAST MEDICAL HISTORY: Past Medical History:  Diagnosis Date   Asthma    COPD (chronic obstructive pulmonary disease) ([place])    CVA (cerebral vascular accident) ([place]) 2014   Depression    Diabetes mellitus ([place])    Diabetes mellitus without complication ([place])     Hepatitis C    History of kidney stones    Hyperlipidemia    Iron deficiency anemia    Obesity    OSA on CPAP    Mild, noncompliant with CPAP said cpap machine was recalled, is waiting for a replacement    Paranoia ([place])    Schizoaffective disorder, bipolar type ([place])     PAST SURGICAL HISTORY: Past Surgical History:  Procedure Laterality Date   BREAST CYST EXCISION Bilateral 2013   Benign   CESAREAN SECTION     CHOLECYSTECTOMY     COLONOSCOPY     ESOPHAGOGASTRODUODENOSCOPY  2020   done in [place] Left 2002   INTERCOSTAL NERVE BLOCK N/A 07/12/2020   Procedure: INTERCOSTAL NERVE BLOCK;  Surgeon: Melrose Nakayama, MD;  Location: [facility];  Service: Thoracic;  Laterality: N/A;   IR IMAGING GUIDED PORT INSERTION  08/20/2020   LYMPH NODE DISSECTION Left 07/12/2020   Procedure: LYMPH NODE DISSECTION;  Surgeon: Melrose Nakayama, MD;  Location: [facility];  Service: Thoracic;  Laterality: Left;   UMBILICAL HERNIA REPAIR     VIDEO BRONCHOSCOPY WITH ENDOBRONCHIAL NAVIGATION N/A 06/07/2020   Procedure: VIDEO BRONCHOSCOPY WITH ENDOBRONCHIAL NAVIGATION;  Surgeon: Tyler Pita, MD;  Location: [facility];  Service: Pulmonary;  Laterality: N/A;    FAMILY HISTORY: No family history on file.  ADVANCED DIRECTIVES (Y/N):  N  HEALTH MAINTENANCE: Social History   Tobacco Use   Smoking status: Former    Packs/day: 2.00    Years: 38.00    Pack years: 76.00    Types: Cigarettes    Quit date: 2012    Years since quitting:  10.8   Smokeless tobacco: Former  Scientific laboratory technician Use: Never used  Substance Use Topics   Alcohol use: Not Currently    Comment: occassionaly   Drug use: Not Currently     Colonoscopy:  PAP:  Bone density:  Lipid panel:  No Known Allergies  Current Outpatient Medications  Medication Sig Dispense Refill   acetaminophen (TYLENOL) 650 MG CR tablet Take 650 mg by mouth 2 (two) times daily.     albuterol (VENTOLIN HFA) 108 (90 Base)  MCG/ACT inhaler Inhale 2 puffs into the lungs every 6 (six) hours as needed for wheezing or shortness of breath.     ASPERCREME ORIGINAL 10 % cream Apply topically.     atorvastatin (LIPITOR) 40 MG tablet Take 40 mg by mouth at bedtime.     benztropine (COGENTIN) 1 MG tablet Take 1 mg by mouth at bedtime.     citalopram (CELEXA) 20 MG tablet Take 20 mg by mouth every morning.     clobetasol cream (TEMOVATE) 0.05 % Apply to chest area twice daily as needed for itching/rash 30 g 1   doxycycline (VIBRA-TABS) 100 MG tablet Take 1 tablet (100 mg total) by mouth 2 (two) times daily. 56 tablet 0   Dulaglutide (TRULICITY) 1.5 KC/1.2XN SOPN Inject 1.5 mg into the skin every Friday.     Fluticasone-Umeclidin-Vilant (TRELEGY ELLIPTA) 100-62.5-25 MCG/INH AEPB Inhale 1 puff into the lungs daily.     gabapentin (NEURONTIN) 300 MG capsule Take 300 mg by mouth 2 (two) times daily.     insulin detemir (LEVEMIR) 100 UNIT/ML injection Inject 30 Units into the skin daily.     latanoprost (XALATAN) 0.005 % ophthalmic solution Place 1 drop into both eyes daily.     lidocaine (LIDODERM) 5 % Place 2 patches onto the skin See admin instructions. Apply 1 patch to each foot every 12 hours     Loperamide HCl (IMODIUM PO) SMARTSIG:1 Capsule(s) By Mouth 1-2 Times Daily PRN     LORazepam (ATIVAN) 1 MG tablet Take 1 mg by mouth 2 (two) times daily.     LYRICA 100 MG capsule Take 100 mg by mouth 2 (two) times daily.     osimertinib mesylate (TAGRISSO) 40 MG tablet Take 1 tablet (40 mg total) by mouth daily. Do not start until given okay by oncologist. 30 tablet 2   prochlorperazine (COMPAZINE) 10 MG tablet Take 1 tablet (10 mg total) by mouth every 6 (six) hours as needed (Nausea or vomiting). 60 tablet 1   SENNA PLUS 8.6-50 MG tablet Take 1 tablet by mouth at bedtime.     traMADol (ULTRAM) 50 MG tablet Take 50 mg by mouth every 6 (six) hours as needed.     ziprasidone (GEODON) 80 MG capsule Take 80 mg by mouth at bedtime.      No current facility-administered medications for this visit.    OBJECTIVE: Vitals:   05/04/21 1037  BP: 129/71  Pulse: 99  Resp: 18  Temp: (!) 97.5 F (36.4 C)  SpO2: 99%     Body mass index is 43.26 kg/m.    ECOG FS:0 - Asymptomatic  General: Well-developed, well-nourished, no acute distress. Eyes: Pink conjunctiva, anicteric sclera. HEENT: Normocephalic, moist mucous membranes. Lungs: No audible wheezing or coughing. Heart: Regular rate and rhythm. Abdomen: Soft, nontender, no obvious distention. Musculoskeletal: No edema, cyanosis, or clubbing. Neuro: Alert, answering all questions appropriately. Cranial nerves grossly intact. Skin: No rashes or petechiae noted. Psych: Normal affect.   LAB  RESULTS:  Lab Results  Component Value Date   NA 137 05/04/2021   K 3.9 05/04/2021   CL 103 05/04/2021   CO2 24 05/04/2021   GLUCOSE 152 (H) 05/04/2021   BUN 13 05/04/2021   CREATININE 1.13 (H) 05/04/2021   CALCIUM 9.0 05/04/2021   PROT 7.3 05/04/2021   ALBUMIN 3.9 05/04/2021   AST 31 05/04/2021   ALT 26 05/04/2021   ALKPHOS 119 05/04/2021   BILITOT 0.5 05/04/2021   GFRNONAA 55 (L) 05/04/2021   GFRAA >60 01/30/2020    Lab Results  Component Value Date   WBC 6.9 05/04/2021   NEUTROABS 5.0 05/04/2021   HGB 9.3 (L) 05/04/2021   HCT 29.6 (L) 05/04/2021   MCV 92.2 05/04/2021   PLT 185 05/04/2021     STUDIES: No results found.  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  PLAN:    1.  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  Patient also noted to have a right iliac bony hypermetabolism as well as a soft tissue mass adjacent suspicious for  isolated metastasis.  Soft tissue mass was biopsied on May 19, 2020 which was negative for malignancy.  Patient underwent resection on July 12, 2020 which revealed 4 positive lymph nodes.  MRI of the brain on August 25, 2020 was negative for metastatic disease.  She completed adjuvant cisplatin and pemetrexed on Oct 28, 2020.  Repeat PET scan results from January 27, 2021 reviewed independently with progressive disease with new metastatic adenopathy in the chest, liver metastasis, and multifocal bone metastasis.  Patient wishes to continue with aggressive chemotherapy and given her EGFR mutation was initiated on Tagrisso in September 2022.  Continue treatment until intolerable side effects or progression of disease.  Tagrisso has been dose reduced to 50 mg daily secondary to persistent anemia and thrombocytopenia.  Return to clinic in 4 weeks for laboratory work and further evaluation.  Appreciate clinical pharmacy input.      2.  Anemia: Chronic and unchanged.  Patient's hemoglobin is 9.3 today. 3.  Hyperglycemia: Patient has improved blood glucose control.  Continue monitoring per primary care. 4.  Neuropathy: Chronic and unchanged.  Continue Lyrica as prescribed. 5.  Thrombocytopenia: Resolved. 6.  Bony lesions: Add Zometa at next clinic visit. 7.  Renal insufficiency: Essentially resolved.  Patient's creatinine is 1.13.   Patient expressed understanding and was in agreement with this plan. She also understands that She can call clinic at any time with any questions, concerns, or complaints.    Cancer Staging  Primary adenocarcinoma of lower lobe of left lung [facility] Staging form: Lung, AJCC 8th Edition - Pathologic stage from 07/27/2020: Stage IVB (pT2b, pN1, cM1c) - Signed by Lloyd Huger, MD on 02/02/2021   Lloyd Huger, MD   05/05/2021 2:04 PM

## 2021-05-04 ENCOUNTER — Inpatient Hospital Stay (HOSPITAL_BASED_OUTPATIENT_CLINIC_OR_DEPARTMENT_OTHER): Payer: Medicaid Other | Admitting: Oncology

## 2021-05-04 ENCOUNTER — Telehealth: Payer: Self-pay | Admitting: Pharmacy Technician

## 2021-05-04 ENCOUNTER — Inpatient Hospital Stay: Payer: Medicaid Other | Attending: Oncology

## 2021-05-04 ENCOUNTER — Other Ambulatory Visit: Payer: Self-pay

## 2021-05-04 VITALS — BP 129/71 | HR 99 | Temp 97.5°F | Resp 18 | Wt 236.5 lb

## 2021-05-04 DIAGNOSIS — C3432 Malignant neoplasm of lower lobe, left bronchus or lung: Secondary | ICD-10-CM | POA: Diagnosis not present

## 2021-05-04 DIAGNOSIS — G629 Polyneuropathy, unspecified: Secondary | ICD-10-CM | POA: Diagnosis not present

## 2021-05-04 DIAGNOSIS — C771 Secondary and unspecified malignant neoplasm of intrathoracic lymph nodes: Secondary | ICD-10-CM | POA: Insufficient documentation

## 2021-05-04 DIAGNOSIS — Z95828 Presence of other vascular implants and grafts: Secondary | ICD-10-CM

## 2021-05-04 DIAGNOSIS — E1165 Type 2 diabetes mellitus with hyperglycemia: Secondary | ICD-10-CM | POA: Diagnosis not present

## 2021-05-04 DIAGNOSIS — C787 Secondary malignant neoplasm of liver and intrahepatic bile duct: Secondary | ICD-10-CM | POA: Diagnosis not present

## 2021-05-04 DIAGNOSIS — C7951 Secondary malignant neoplasm of bone: Secondary | ICD-10-CM | POA: Diagnosis not present

## 2021-05-04 DIAGNOSIS — Z87891 Personal history of nicotine dependence: Secondary | ICD-10-CM | POA: Insufficient documentation

## 2021-05-04 DIAGNOSIS — D649 Anemia, unspecified: Secondary | ICD-10-CM | POA: Insufficient documentation

## 2021-05-04 LAB — COMPREHENSIVE METABOLIC PANEL
ALT: 26 U/L (ref 0–44)
AST: 31 U/L (ref 15–41)
Albumin: 3.9 g/dL (ref 3.5–5.0)
Alkaline Phosphatase: 119 U/L (ref 38–126)
Anion gap: 10 (ref 5–15)
BUN: 13 mg/dL (ref 8–23)
CO2: 24 mmol/L (ref 22–32)
Calcium: 9 mg/dL (ref 8.9–10.3)
Chloride: 103 mmol/L (ref 98–111)
Creatinine, Ser: 1.13 mg/dL — ABNORMAL HIGH (ref 0.44–1.00)
GFR, Estimated: 55 mL/min — ABNORMAL LOW (ref >60)
Glucose, Bld: 152 mg/dL — ABNORMAL HIGH (ref 70–99)
Potassium: 3.9 mmol/L (ref 3.5–5.1)
Sodium: 137 mmol/L (ref 135–145)
Total Bilirubin: 0.5 mg/dL (ref 0.3–1.2)
Total Protein: 7.3 g/dL (ref 6.5–8.1)

## 2021-05-04 LAB — CBC WITH DIFFERENTIAL/PLATELET
Abs Immature Granulocytes: 0.04 10*3/uL (ref 0.00–0.07)
Basophils Absolute: 0 10*3/uL (ref 0.0–0.1)
Basophils Relative: 0 %
Eosinophils Absolute: 0 10*3/uL (ref 0.0–0.5)
Eosinophils Relative: 1 %
HCT: 29.6 % — ABNORMAL LOW (ref 36.0–46.0)
Hemoglobin: 9.3 g/dL — ABNORMAL LOW (ref 12.0–15.0)
Immature Granulocytes: 1 %
Lymphocytes Relative: 22 %
Lymphs Abs: 1.5 10*3/uL (ref 0.7–4.0)
MCH: 29 pg (ref 26.0–34.0)
MCHC: 31.4 g/dL (ref 30.0–36.0)
MCV: 92.2 fL (ref 80.0–100.0)
Monocytes Absolute: 0.3 10*3/uL (ref 0.1–1.0)
Monocytes Relative: 4 %
Neutro Abs: 5 10*3/uL (ref 1.7–7.7)
Neutrophils Relative %: 72 %
Platelets: 185 10*3/uL (ref 150–400)
RBC: 3.21 MIL/uL — ABNORMAL LOW (ref 3.87–5.11)
RDW: 15.8 % — ABNORMAL HIGH (ref 11.5–15.5)
WBC: 6.9 10*3/uL (ref 4.0–10.5)
nRBC: 0 % (ref 0.0–0.2)

## 2021-05-04 LAB — MAGNESIUM: Magnesium: 1.9 mg/dL (ref 1.7–2.4)

## 2021-05-04 MED ORDER — SODIUM CHLORIDE 0.9% FLUSH
10.0000 mL | INTRAVENOUS | Status: DC | PRN
  Administered 2021-05-04: 10 mL via INTRAVENOUS
  Filled 2021-05-04: qty 10

## 2021-05-04 MED ORDER — HEPARIN SOD (PORK) LOCK FLUSH 100 UNIT/ML IV SOLN
500.0000 [IU] | Freq: Once | INTRAVENOUS | Status: AC
  Filled 2021-05-04: qty 5

## 2021-05-04 MED ORDER — HEPARIN SOD (PORK) LOCK FLUSH 100 UNIT/ML IV SOLN
INTRAVENOUS | Status: AC
  Administered 2021-05-04: 500 [IU] via INTRAVENOUS
  Filled 2021-05-04: qty 5

## 2021-05-04 NOTE — Telephone Encounter (Signed)
Called Dr Ovid Curd at [facility] and let her know that patients labs were normal and she could start on Tagrisso 40mg  dose. Dr Ovid Curd will call patient to let her know.  [place] Patient [place] Phone [phone] Fax [phone] 05/04/2021 11:50 AM

## 2021-05-04 NOTE — Progress Notes (Signed)
Pt has no concerns/complaints at this time. 

## 2021-05-05 ENCOUNTER — Encounter: Payer: Self-pay | Admitting: Oncology

## 2021-05-17 ENCOUNTER — Telehealth: Payer: Self-pay | Admitting: *Deleted

## 2021-05-17 NOTE — Telephone Encounter (Signed)
Spoke with Dr. Coralie Common- [phone] on behalf of Dr. Grayland Ormond.  Dr. Coralie Common is ordering a cbc/metc/mag panel. At this time, Dr. Grayland Ormond does not need any additional labs. Dr. Coralie Common thanked me for calling her back and she will make sure Dr. Grayland Ormond receives a copy of these labs.

## 2021-06-02 ENCOUNTER — Other Ambulatory Visit: Payer: Self-pay

## 2021-06-02 ENCOUNTER — Inpatient Hospital Stay: Payer: Medicaid Other

## 2021-06-02 ENCOUNTER — Encounter: Payer: Self-pay | Admitting: Oncology

## 2021-06-02 ENCOUNTER — Inpatient Hospital Stay: Payer: Medicaid Other | Attending: Oncology | Admitting: Pharmacist

## 2021-06-02 ENCOUNTER — Inpatient Hospital Stay (HOSPITAL_BASED_OUTPATIENT_CLINIC_OR_DEPARTMENT_OTHER): Payer: Medicaid Other | Admitting: Oncology

## 2021-06-02 ENCOUNTER — Ambulatory Visit: Payer: Medicaid Other | Admitting: Pharmacist

## 2021-06-02 ENCOUNTER — Other Ambulatory Visit: Payer: Medicaid Other

## 2021-06-02 VITALS — BP 119/71 | HR 100 | Temp 98.3°F | Ht 62.0 in | Wt 235.7 lb

## 2021-06-02 DIAGNOSIS — C787 Secondary malignant neoplasm of liver and intrahepatic bile duct: Secondary | ICD-10-CM | POA: Insufficient documentation

## 2021-06-02 DIAGNOSIS — C3432 Malignant neoplasm of lower lobe, left bronchus or lung: Secondary | ICD-10-CM | POA: Diagnosis present

## 2021-06-02 DIAGNOSIS — C7951 Secondary malignant neoplasm of bone: Secondary | ICD-10-CM | POA: Diagnosis present

## 2021-06-02 MED ORDER — SODIUM CHLORIDE 0.9 % IV SOLN
Freq: Once | INTRAVENOUS | Status: AC
  Filled 2021-06-02: qty 250

## 2021-06-02 MED ORDER — HEPARIN SOD (PORK) LOCK FLUSH 100 UNIT/ML IV SOLN
500.0000 [IU] | Freq: Once | INTRAVENOUS | Status: AC | PRN
  Administered 2021-06-02: 500 [IU]
  Filled 2021-06-02: qty 5

## 2021-06-02 MED ORDER — ZOLEDRONIC ACID 4 MG/100ML IV SOLN
4.0000 mg | Freq: Once | INTRAVENOUS | Status: AC
  Administered 2021-06-02: 4 mg via INTRAVENOUS
  Filled 2021-06-02: qty 100

## 2021-06-02 NOTE — Progress Notes (Addendum)
[place]  [phone] [phone]  Patient Care Team: Center, [facility] as PCP - General Telford Nab, RN as Oncology Nurse Navigator Grayland Ormond, Kathlene November, MD as Consulting Physician (Hematology and Oncology)   Name of the patient: Natalie [name]  [id number]  1960-10-05   Date of visit: 06/02/21  HPI: Patient is a 60 y.o. 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 Currently treated with Tagrisso (osimertinib), which she started on 02/19/21.   Reason for Consult: Oral chemotherapy follow-up for osimertinib therapy.   PAST MEDICAL HISTORY: Past Medical History:  Diagnosis Date   Asthma    COPD (chronic obstructive pulmonary disease) ([place])    CVA (cerebral vascular accident) ([place]) 2014   Depression    Diabetes mellitus ([place])    Diabetes mellitus without complication ([place])    Hepatitis C    History of kidney stones    Hyperlipidemia    Iron deficiency anemia    Obesity    OSA on CPAP    Mild, noncompliant with CPAP said cpap machine was recalled, is waiting for a replacement    Paranoia ([place])    Schizoaffective disorder, bipolar type [facility])     HEMATOLOGY/ONCOLOGY HISTORY:  Oncology History  Primary adenocarcinoma of lower lobe of left lung ([place])  05/08/2020 Initial Diagnosis   Primary adenocarcinoma of lower lobe of left lung ([place])   07/27/2020 Cancer Staging   Staging form: Lung, AJCC 8th Edition - Pathologic stage from 07/27/2020: Stage IVB (pT2b, pN1, cM1c) - Signed by Lloyd Huger, MD on 02/02/2021    08/26/2020 - 10/28/2020 Chemotherapy   Patient is on Treatment Plan : LUNG NSCLC Pemetrexed ([place]) + Cisplatin q21d x 4 cycles       ALLERGIES:  has No Known Allergies.  MEDICATIONS:  Current Outpatient Medications  Medication Sig Dispense Refill   acetaminophen (TYLENOL) 650 MG CR tablet Take 650 mg by mouth 2 (two)  times daily.     albuterol (VENTOLIN HFA) 108 (90 Base) MCG/ACT inhaler Inhale 2 puffs into the lungs every 6 (six) hours as needed for wheezing or shortness of breath.     ASPERCREME ORIGINAL 10 % cream Apply topically.     atorvastatin (LIPITOR) 40 MG tablet Take 40 mg by mouth at bedtime.     benztropine (COGENTIN) 1 MG tablet Take 1 mg by mouth at bedtime.     citalopram (CELEXA) 20 MG tablet Take 20 mg by mouth every morning.     clobetasol cream (TEMOVATE) 0.05 % Apply to chest area twice daily as needed for itching/rash 30 g 1   doxycycline (VIBRA-TABS) 100 MG tablet Take 1 tablet (100 mg total) by mouth 2 (two) times daily. 56 tablet 0   Dulaglutide (TRULICITY) 1.5 YD/7.4JO SOPN Inject 1.5 mg into the skin every Friday.     Fluticasone-Umeclidin-Vilant (TRELEGY ELLIPTA) 100-62.5-25 MCG/INH AEPB Inhale 1 puff into the lungs daily.     gabapentin (NEURONTIN) 300 MG capsule Take 300 mg by mouth 2 (two) times daily.     insulin detemir (LEVEMIR) 100 UNIT/ML injection Inject 30 Units into the skin daily.     latanoprost (XALATAN) 0.005 % ophthalmic solution Place 1 drop into both eyes daily.     lidocaine (LIDODERM) 5 % Place 2 patches onto the skin See admin instructions. Apply 1 patch to each foot every 12 hours     Loperamide HCl (IMODIUM PO) SMARTSIG:1 Capsule(s) By Mouth  1-2 Times Daily PRN     LORazepam (ATIVAN) 1 MG tablet Take 1 mg by mouth 2 (two) times daily.     LYRICA 100 MG capsule Take 100 mg by mouth 2 (two) times daily.     osimertinib mesylate (TAGRISSO) 40 MG tablet Take 1 tablet (40 mg total) by mouth daily. Do not start until given okay by oncologist. 30 tablet 2   prochlorperazine (COMPAZINE) 10 MG tablet Take 1 tablet (10 mg total) by mouth every 6 (six) hours as needed (Nausea or vomiting). 60 tablet 1   SENNA PLUS 8.6-50 MG tablet Take 1 tablet by mouth at bedtime.     traMADol (ULTRAM) 50 MG tablet Take 50 mg by mouth every 6 (six) hours as needed.     ziprasidone  (GEODON) 80 MG capsule Take 80 mg by mouth at bedtime.     No current facility-administered medications for this visit.    VITAL SIGNS: There were no vitals taken for this visit. There were no vitals filed for this visit.  Estimated body mass index is 43.11 kg/m as calculated from the following:   Height as of an earlier encounter on 06/02/21: 5' 2" (1.575 m).   Weight as of an earlier encounter on 06/02/21: 106.9 kg (235 lb 11.2 oz).  LABS: CBC:    Component Value Date/Time   WBC 6.9 05/04/2021 1000   HGB 9.3 (L) 05/04/2021 1000   HCT 29.6 (L) 05/04/2021 1000   PLT 185 05/04/2021 1000   MCV 92.2 05/04/2021 1000   NEUTROABS 5.0 05/04/2021 1000   LYMPHSABS 1.5 05/04/2021 1000   MONOABS 0.3 05/04/2021 1000   EOSABS 0.0 05/04/2021 1000   BASOSABS 0.0 05/04/2021 1000   Comprehensive Metabolic Panel:    Component Value Date/Time   NA 137 05/04/2021 1000   K 3.9 05/04/2021 1000   CL 103 05/04/2021 1000   CO2 24 05/04/2021 1000   BUN 13 05/04/2021 1000   CREATININE 1.13 (H) 05/04/2021 1000   GLUCOSE 152 (H) 05/04/2021 1000   CALCIUM 9.0 05/04/2021 1000   AST 31 05/04/2021 1000   ALT 26 05/04/2021 1000   ALKPHOS 119 05/04/2021 1000   BILITOT 0.5 05/04/2021 1000   PROT 7.3 05/04/2021 1000   ALBUMIN 3.9 05/04/2021 1000     Present during today's visit: patient only  Assessment and Plan: CBC and CMP collected by [facility] yesterday (patient preference due to have them done there because she is a hard stick). There are issues with her CBC, this will need to be repeated when she returns to [facility] tomorrow. Continue osimertinib 32m daily (she is breaking her 858mtablets in half)   Oral Chemotherapy Side Effect/Intolerance:  No reported fatigue, nail changes, or rash  Oral Chemotherapy Adherence: no missed doses since starting No patient barriers to medication adherence identified.   New medications: She reported being started on aspirin last month, med added to her medication  list  Medication Access Issues: receives her osimertinib from [facility]  Patient expressed understanding and was in agreement with this plan. She also understands that She can call clinic at any time with any questions, concerns, or complaints.   Follow-up plan: RTC in 1 month  Thank you for allowing me to participate in the care of this very pleasant patient.   Time Total: 15 mins  Visit consisted of counseling and education on dealing with issues of symptom management in the setting of serious and potentially life-threatening illness.Greater than 50%  of this time  was spent counseling and coordinating care related to the above assessment and plan.  Signed by: Darl Pikes, PharmD, BCPS, Salley Slaughter, CPP Hematology/Oncology Clinical Pharmacist Practitioner [place]/HP/AP Oral [facility] [phone]  06/02/2021 12:42 PM

## 2021-06-02 NOTE — Patient Instructions (Signed)
[facility] CANCER CTR AT [place]  Discharge Instructions: Thank you for choosing [facility] to provide your oncology and hematology care.  If you have a lab appointment with the [facility], please go directly to the [facility] and check in at the registration area.  Wear comfortable clothing and clothing appropriate for easy access to any Portacath or PICC line.   We strive to give you quality time with your provider. You may need to reschedule your appointment if you arrive late (15 or more minutes).  Arriving late affects you and other patients whose appointments are after yours.  Also, if you miss three or more appointments without notifying the office, you may be dismissed from the clinic at the providers discretion.      For prescription refill requests, have your pharmacy contact our office and allow 72 hours for refills to be completed.    Today you received the following : Zometa   Zoledronic Acid Injection (Hypercalcemia, Oncology) What is this medication? ZOLEDRONIC ACID (ZOE le dron ik AS id) slows calcium loss from bones. It high calcium levels in the blood from some kinds of cancer. It may be used in other people at risk for bone loss. This medicine may be used for other purposes; ask your health care provider or pharmacist if you have questions. COMMON BRAND NAME(S): Zometa What should I tell my care team before I take this medication? They need to know if you have any of these conditions: cancer dehydration dental disease kidney disease liver disease low levels of calcium in the blood lung or breathing disease (asthma) receiving steroids like dexamethasone or prednisone an unusual or allergic reaction to zoledronic acid, other medicines, foods, dyes, or preservatives pregnant or trying to get pregnant breast-feeding How should I use this medication? This drug is injected into a vein. It is given by a health care provider in a hospital  or clinic setting. Talk to your health care provider about the use of this drug in children. Special care may be needed. Overdosage: If you think you have taken too much of this medicine contact a poison control center or emergency room at once. NOTE: This medicine is only for you. Do not share this medicine with others. What if I miss a dose? Keep appointments for follow-up doses. It is important not to miss your dose. Call your health care provider if you are unable to keep an appointment. What may interact with this medication? certain antibiotics given by injection NSAIDs, medicines for pain and inflammation, like ibuprofen or naproxen some diuretics like bumetanide, furosemide teriparatide thalidomide This list may not describe all possible interactions. Give your health care provider a list of all the medicines, herbs, non-prescription drugs, or dietary supplements you use. Also tell them if you smoke, drink alcohol, or use illegal drugs. Some items may interact with your medicine. What should I watch for while using this medication? Visit your health care provider for regular checks on your progress. It may be some time before you see the benefit from this drug. Some people who take this drug have severe bone, joint, or muscle pain. This drug may also increase your risk for jaw problems or a broken thigh bone. Tell your health care provider right away if you have severe pain in your jaw, bones, joints, or muscles. Tell you health care provider if you have any pain that does not go away or that gets worse. Tell your dentist and dental surgeon that you  are taking this drug. You should not have major dental surgery while on this drug. See your dentist to have a dental exam and fix any dental problems before starting this drug. Take good care of your teeth while on this drug. Make sure you see your dentist for regular follow-up appointments. You should make sure you get enough calcium and vitamin  D while you are taking this drug. Discuss the foods you eat and the vitamins you take with your health care provider. Check with your health care provider if you have severe diarrhea, nausea, and vomiting, or if you sweat a lot. The loss of too much body fluid may make it dangerous for you to take this drug. You may need blood work done while you are taking this drug. Do not become pregnant while taking this drug. Women should inform their health care provider if they wish to become pregnant or think they might be pregnant. There is potential for serious harm to an unborn child. Talk to your health care provider for more information. What side effects may I notice from receiving this medication? Side effects that you should report to your doctor or health care provider as soon as possible: allergic reactions (skin rash, itching or hives; swelling of the face, lips, or tongue) bone pain infection (fever, chills, cough, sore throat, pain or trouble passing urine) jaw pain, especially after dental work joint pain kidney injury (trouble passing urine or change in the amount of urine) low blood pressure (dizziness; feeling faint or lightheaded, falls; unusually weak or tired) low calcium levels (fast heartbeat; muscle cramps or pain; pain, tingling, or numbness in the hands or feet; seizures) low magnesium levels (fast, irregular heartbeat; muscle cramp or pain; muscle weakness; tremors; seizures) low red blood cell counts (trouble breathing; feeling faint; lightheaded, falls; unusually weak or tired) muscle pain redness, blistering, peeling, or loosening of the skin, including inside the mouth severe diarrhea swelling of the ankles, feet, hands trouble breathing Side effects that usually do not require medical attention (report to your doctor or health care provider if they continue or are bothersome): anxious constipation coughing depressed mood eye irritation, itching, or pain fever general  ill feeling or flu-like symptoms nausea pain, redness, or irritation at site where injected trouble sleeping This list may not describe all possible side effects. Call your doctor for medical advice about side effects. You may report side effects to FDA at 1-800-FDA-1088. Where should I keep my medication? This drug is given in a hospital or clinic. It will not be stored at home. NOTE: This sheet is a summary. It may not cover all possible information. If you have questions about this medicine, talk to your doctor, pharmacist, or health care provider.  2022 Elsevier/Gold Standard (2021-02-22 00:00:00)    To help prevent nausea and vomiting after your treatment, we encourage you to take your nausea medication as directed.  BELOW ARE SYMPTOMS THAT SHOULD BE REPORTED IMMEDIATELY: *FEVER GREATER THAN 100.4 F (38 C) OR HIGHER *CHILLS OR SWEATING *NAUSEA AND VOMITING THAT IS NOT CONTROLLED WITH YOUR NAUSEA MEDICATION *UNUSUAL SHORTNESS OF BREATH *UNUSUAL BRUISING OR BLEEDING *URINARY PROBLEMS (pain or burning when urinating, or frequent urination) *BOWEL PROBLEMS (unusual diarrhea, constipation, pain near the anus) TENDERNESS IN MOUTH AND THROAT WITH OR WITHOUT PRESENCE OF ULCERS (sore throat, sores in mouth, or a toothache) UNUSUAL RASH, SWELLING OR PAIN  UNUSUAL VAGINAL DISCHARGE OR ITCHING   Items with * indicate a potential emergency and should be followed up  as soon as possible or go to the Emergency Department if any problems should occur.  Please show the CHEMOTHERAPY ALERT CARD or IMMUNOTHERAPY ALERT CARD at check-in to the Emergency Department and triage nurse.  Should you have questions after your visit or need to cancel or reschedule your appointment, please contact [facility] CANCER [place] AT [place]  [phone] and follow the prompts.  Office hours are 8:00 a.m. to 4:30 p.m. Monday - Friday. Please note that voicemails left after 4:00 p.m. may not be returned until  the following business day.  We are closed weekends and major holidays. You have access to a nurse at all times for urgent questions. Please call the main number to the clinic [phone] and follow the prompts.  For any non-urgent questions, you may also contact your provider using MyChart. We now offer e-Visits for anyone 53 and older to request care online for non-urgent symptoms. For details visit [email or web address].   Also download the MyChart app! Go to the app store, search "MyChart", open the app, select [facility], and log in with your MyChart username and password.  Due to Covid, a mask is required upon entering the hospital/clinic. If you do not have a mask, one will be given to you upon arrival. For doctor visits, patients may have 1 support person aged 40 or older with them. For treatment visits, patients cannot have anyone with them due to current Covid guidelines and our immunocompromised population.

## 2021-06-02 NOTE — Progress Notes (Signed)
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   INTERVAL HISTORY: Patient returns to clinic today for repeat laboratory work, further evaluation, and continuation of treatment with Tagrisso.  Previously, Tagrisso was dose reduced secondary to persistent pancytopenia.  She is tolerating her treatment well without significant side effects.  She currently feels well and is asymptomatic.  She has no further rash.  She has no neurologic complaints.  She denies any recent fevers or illnesses.  She has a good appetite and denies weight loss.  She has no chest pain, shortness of breath, cough, or hemoptysis.  She denies any nausea, vomiting, constipation, or diarrhea.  She has no urinary complaints.  Patient offers no specific complaints today.  REVIEW OF SYSTEMS:   Review of Systems  Constitutional: Negative.  Negative for fever, malaise/fatigue and weight loss.  Respiratory: Negative.  Negative for cough, hemoptysis and shortness of breath.   Cardiovascular: Negative.  Negative for chest pain and leg swelling.  Gastrointestinal: Negative.  Negative for abdominal pain.  Genitourinary: Negative.  Negative for dysuria and flank pain.  Musculoskeletal: Negative.  Negative for back pain and joint pain.  Skin: Negative.  Negative for rash.  Neurological: Negative.  Negative for dizziness, focal weakness, weakness and headaches.  Psychiatric/Behavioral: Negative.  The patient is not nervous/anxious.    As per HPI. Otherwise, a complete review of systems is negative.  PAST MEDICAL HISTORY: Past Medical History:   Diagnosis Date   Asthma    COPD (chronic obstructive pulmonary disease) ([place])    CVA (cerebral vascular accident) ([place]) 2014   Depression    Diabetes mellitus ([place])    Diabetes mellitus without complication ([place])    Hepatitis C    History of kidney stones    Hyperlipidemia    Iron deficiency anemia    Obesity    OSA on CPAP    Mild, noncompliant with CPAP said cpap machine was recalled, is waiting for a replacement    Paranoia ([place])    Schizoaffective disorder, bipolar type ([place])     PAST SURGICAL HISTORY: Past Surgical History:  Procedure Laterality Date   BREAST CYST EXCISION Bilateral 2013   Benign   CESAREAN SECTION     CHOLECYSTECTOMY     COLONOSCOPY     ESOPHAGOGASTRODUODENOSCOPY  2020   done in [place] Left 2002   INTERCOSTAL NERVE BLOCK N/A 07/12/2020   Procedure: INTERCOSTAL NERVE BLOCK;  Surgeon: Melrose Nakayama, MD;  Location: [facility];  Service: Thoracic;  Laterality: N/A;   IR IMAGING GUIDED PORT INSERTION  08/20/2020   LYMPH NODE DISSECTION Left 07/12/2020   Procedure: LYMPH NODE DISSECTION;  Surgeon: Melrose Nakayama, MD;  Location: [facility];  Service: Thoracic;  Laterality: Left;   UMBILICAL HERNIA REPAIR     VIDEO BRONCHOSCOPY WITH ENDOBRONCHIAL NAVIGATION N/A 06/07/2020   Procedure: VIDEO BRONCHOSCOPY WITH ENDOBRONCHIAL NAVIGATION;  Surgeon: Tyler Pita, MD;  Location: [facility] OR[facility];  Service: Pulmonary;  Laterality: N/A;    FAMILY HISTORY: History reviewed. No pertinent family history.  ADVANCED DIRECTIVES (Y/N):  N  HEALTH MAINTENANCE: Social History   Tobacco Use   Smoking status: Former  Packs/day: 2.00    Years: 38.00    Pack years: 76.00    Types: Cigarettes    Quit date: 2012    Years since quitting: 10.9   Smokeless tobacco: Former  Scientific laboratory technician Use: Never used  Substance Use Topics   Alcohol use: Yes    Comment: occassionaly   Drug use: Not Currently     Colonoscopy:  PAP:  Bone  density:  Lipid panel:  No Known Allergies  Current Outpatient Medications  Medication Sig Dispense Refill   acetaminophen (TYLENOL) 650 MG CR tablet Take 650 mg by mouth 2 (two) times daily.     albuterol (VENTOLIN HFA) 108 (90 Base) MCG/ACT inhaler Inhale 2 puffs into the lungs every 6 (six) hours as needed for wheezing or shortness of breath.     ASPERCREME ORIGINAL 10 % cream Apply topically.     atorvastatin (LIPITOR) 40 MG tablet Take 40 mg by mouth at bedtime.     benztropine (COGENTIN) 1 MG tablet Take 1 mg by mouth at bedtime.     citalopram (CELEXA) 20 MG tablet Take 20 mg by mouth every morning.     clobetasol cream (TEMOVATE) 0.05 % Apply to chest area twice daily as needed for itching/rash 30 g 1   doxycycline (VIBRA-TABS) 100 MG tablet Take 1 tablet (100 mg total) by mouth 2 (two) times daily. 56 tablet 0   Dulaglutide (TRULICITY) 1.5 CL/2.7NT SOPN Inject 1.5 mg into the skin every Friday.     Fluticasone-Umeclidin-Vilant (TRELEGY ELLIPTA) 100-62.5-25 MCG/INH AEPB Inhale 1 puff into the lungs daily.     gabapentin (NEURONTIN) 300 MG capsule Take 300 mg by mouth 2 (two) times daily.     insulin detemir (LEVEMIR) 100 UNIT/ML injection Inject 30 Units into the skin daily.     latanoprost (XALATAN) 0.005 % ophthalmic solution Place 1 drop into both eyes daily.     lidocaine (LIDODERM) 5 % Place 2 patches onto the skin See admin instructions. Apply 1 patch to each foot every 12 hours     Loperamide HCl (IMODIUM PO) SMARTSIG:1 Capsule(s) By Mouth 1-2 Times Daily PRN     LORazepam (ATIVAN) 1 MG tablet Take 1 mg by mouth 2 (two) times daily.     LYRICA 100 MG capsule Take 100 mg by mouth 2 (two) times daily.     osimertinib mesylate (TAGRISSO) 40 MG tablet Take 1 tablet (40 mg total) by mouth daily. Do not start until given okay by oncologist. 30 tablet 2   prochlorperazine (COMPAZINE) 10 MG tablet Take 1 tablet (10 mg total) by mouth every 6 (six) hours as needed (Nausea or vomiting).  60 tablet 1   SENNA PLUS 8.6-50 MG tablet Take 1 tablet by mouth at bedtime.     traMADol (ULTRAM) 50 MG tablet Take 50 mg by mouth every 6 (six) hours as needed.     ziprasidone (GEODON) 80 MG capsule Take 80 mg by mouth at bedtime.     ASPIRIN LOW DOSE 81 MG EC tablet Take 81 mg by mouth daily.     No current facility-administered medications for this visit.    OBJECTIVE: Vitals:   06/02/21 1037  BP: 119/71  Pulse: 100  Temp: 98.3 F (36.8 C)  SpO2: 96%     Body mass index is 43.11 kg/m.    ECOG FS:0 - Asymptomatic  General: Well-developed, well-nourished, no acute distress.  Sitting in a wheelchair. Eyes: Pink conjunctiva, anicteric sclera. HEENT: Normocephalic, moist  mucous membranes. Lungs: No audible wheezing or coughing. Heart: Regular rate and rhythm. Abdomen: Soft, nontender, no obvious distention. Musculoskeletal: No edema, cyanosis, or clubbing. Neuro: Alert, answering all questions appropriately. Cranial nerves grossly intact. Skin: No rashes or petechiae noted. Psych: Normal affect.  LAB RESULTS:  Lab Results  Component Value Date   NA 137 05/04/2021   K 3.9 05/04/2021   CL 103 05/04/2021   CO2 24 05/04/2021   GLUCOSE 152 (H) 05/04/2021   BUN 13 05/04/2021   CREATININE 1.13 (H) 05/04/2021   CALCIUM 9.0 05/04/2021   PROT 7.3 05/04/2021   ALBUMIN 3.9 05/04/2021   AST 31 05/04/2021   ALT 26 05/04/2021   ALKPHOS 119 05/04/2021   BILITOT 0.5 05/04/2021   GFRNONAA 55 (L) 05/04/2021   GFRAA >60 01/30/2020    Lab Results  Component Value Date   WBC 6.9 05/04/2021   NEUTROABS 5.0 05/04/2021   HGB 9.3 (L) 05/04/2021   HCT 29.6 (L) 05/04/2021   MCV 92.2 05/04/2021   PLT 185 05/04/2021     STUDIES: No results found.  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  PLAN:    1.  Progressive stage IV  non-small cell carcinoma of the left lower lobe lung: PET scan results from May 06, 2020 reviewed independently with hypermetabolic left lower lobe lung mass and biopsy confirmed malignancy.  Patient also noted to have a right iliac bony hypermetabolism as well as a soft tissue mass adjacent suspicious for isolated metastasis.  Soft tissue mass was biopsied on May 19, 2020 which was negative for malignancy.  Patient underwent resection on July 12, 2020 which revealed 4 positive lymph nodes.  MRI of the brain on August 25, 2020 was negative for metastatic disease.  She completed adjuvant cisplatin and pemetrexed on Oct 28, 2020.  Repeat PET scan results from January 27, 2021 reviewed independently with progressive disease with new metastatic adenopathy in the chest, liver metastasis, and multifocal bone metastasis.  Patient wishes to continue with aggressive chemotherapy and given her EGFR mutation was initiated on Tagrisso in September 2022.  Continue treatment until intolerable side effects or progression of disease.  Tagrisso has been dose reduced to 40 mg daily secondary to persistent anemia and thrombocytopenia.  Patient had laboratory work drawn at outside facility.  Proceed with treatment as planned.  Return to clinic in 4 weeks for further evaluation and continuation of treatment. Appreciate clinical pharmacy input. 2.  Anemia: Chronic and unchanged. 3.  Hyperglycemia: Patient has improved blood glucose control.  Continue monitoring per primary care. 4.  Neuropathy: Chronic and unchanged.  Continue Lyrica as prescribed. 5.  Thrombocytopenia: Resolved. 6.  Bony lesions: Calcium is adequate.  Proceed with Zometa as scheduled. 7.  Renal insufficiency: Mild, monitor.  Patient expressed understanding and was in agreement with this plan. She also understands that She can call clinic at any time with any questions, concerns, or complaints.    Cancer Staging  Primary adenocarcinoma of lower lobe  of left lung [facility]) Staging form: Lung, AJCC 8th Edition - Pathologic stage from 07/27/2020: Stage IVB (pT2b, pN1, cM1c) - Signed by Lloyd Huger, MD on 02/02/2021   Lloyd Huger, MD   06/02/2021 5:26 PM

## 2021-06-03 ENCOUNTER — Telehealth: Payer: Self-pay | Admitting: *Deleted

## 2021-06-03 NOTE — Telephone Encounter (Signed)
Contacted patient s/p her first zometa infusion yesterday. Pt states that she tolerated the infusion well and does not have any concerns at this time. She thanked me for calling her and following up with her.

## 2021-06-09 ENCOUNTER — Telehealth: Payer: Self-pay | Admitting: Pharmacist

## 2021-06-09 NOTE — Telephone Encounter (Signed)
Oral Chemotherapy Pharmacist Encounter   Called [facility] on 06/07/21 to check on the status of Natalie [name]'s repeat. There was an issue with the CBC sample collected by [facility] on 06/01/21 and the CBC could not be reported out. [facility] MD Dr. Ovid Curd said that her labs will be rechecked on 06/08/21 and she would fax over the results.  Results received and reviewed on 06/09/21. Plt decreased from 185K/uL (05/04/21, orr treatment) to 97K/uL (06/08/21), continue to monitor. Hgb stable. Okay to continue osemiertinib 40 mg.   Labs placed for scanning into medical record.    Darl Pikes, PharmD, BCPS, BCOP, CPP Hematology/Oncology Clinical Pharmacist [place]/DB/AP Oral [facility] [phone]  06/09/2021 11:46 AM

## 2021-06-14 ENCOUNTER — Encounter: Payer: Self-pay | Admitting: Oncology

## 2021-07-04 NOTE — Progress Notes (Signed)
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   INTERVAL HISTORY: Patient returns to clinic today for repeat laboratory work, further evaluation and continuation of Tagrisso and Zometa.  She continues to have occasional dyspnea on exertion and shortness of breath, but otherwise feels well.  She is tolerating her treatments well without significant side effects.  She has no neurologic complaints.  She denies any recent fevers or illnesses.  She has a good appetite and denies weight loss.  She has no chest pain, cough, or hemoptysis.  She denies any nausea, vomiting, constipation, or diarrhea.  She has no urinary complaints.  Patient offers no further specific complaints today.  REVIEW OF SYSTEMS:   Review of Systems  Constitutional: Negative.  Negative for fever, malaise/fatigue and weight loss.  Respiratory:  Positive for shortness of breath. Negative for cough and hemoptysis.   Cardiovascular: Negative.  Negative for chest pain and leg swelling.  Gastrointestinal: Negative.  Negative for abdominal pain.  Genitourinary: Negative.  Negative for dysuria and flank pain.  Musculoskeletal: Negative.  Negative for back pain and joint pain.  Skin: Negative.  Negative for rash.  Neurological: Negative.  Negative for dizziness, focal weakness, weakness and headaches.  Psychiatric/Behavioral: Negative.  The patient is not nervous/anxious.    As per HPI. Otherwise, a complete review of systems is negative.  PAST MEDICAL HISTORY: Past Medical History:  Diagnosis Date   Asthma    COPD (chronic obstructive pulmonary  disease) ([place])    CVA (cerebral vascular accident) ([place]) 2014   Depression    Diabetes mellitus ([place])    Diabetes mellitus without complication ([place])    Hepatitis C    History of kidney stones    Hyperlipidemia    Iron deficiency anemia    Obesity    OSA on CPAP    Mild, noncompliant with CPAP said cpap machine was recalled, is waiting for a replacement    Paranoia ([place])    Schizoaffective disorder, bipolar type ([place])     PAST SURGICAL HISTORY: Past Surgical History:  Procedure Laterality Date   BREAST CYST EXCISION Bilateral 2013   Benign   CESAREAN SECTION     CHOLECYSTECTOMY     COLONOSCOPY     ESOPHAGOGASTRODUODENOSCOPY  2020   done in [place] Left 2002   INTERCOSTAL NERVE BLOCK N/A 07/12/2020   Procedure: INTERCOSTAL NERVE BLOCK;  Surgeon: Melrose Nakayama, MD;  Location: [facility];  Service: Thoracic;  Laterality: N/A;   IR IMAGING GUIDED PORT INSERTION  08/20/2020   LYMPH NODE DISSECTION Left 07/12/2020   Procedure: LYMPH NODE DISSECTION;  Surgeon: Melrose Nakayama, MD;  Location: [facility];  Service: Thoracic;  Laterality: Left;   UMBILICAL HERNIA REPAIR     VIDEO BRONCHOSCOPY WITH ENDOBRONCHIAL NAVIGATION N/A 06/07/2020   Procedure: VIDEO BRONCHOSCOPY WITH ENDOBRONCHIAL NAVIGATION;  Surgeon: Tyler Pita, MD;  Location: [facility];  Service: Pulmonary;  Laterality: N/A;    FAMILY HISTORY: History reviewed. No pertinent family history.  ADVANCED DIRECTIVES (Y/N):  N  HEALTH MAINTENANCE: Social History   Tobacco Use   Smoking status: Former    Packs/day: 2.00    Years:  38.00    Pack years: 76.00    Types: Cigarettes    Quit date: 2012    Years since quitting: 11.0   Smokeless tobacco: Former  Scientific laboratory technician Use: Never used  Substance Use Topics   Alcohol use: Yes    Comment: occassionaly   Drug use: Not Currently     Colonoscopy:  PAP:  Bone density:  Lipid panel:  No Known Allergies  Current Outpatient Medications   Medication Sig Dispense Refill   acetaminophen (TYLENOL) 650 MG CR tablet Take 650 mg by mouth 2 (two) times daily.     albuterol (VENTOLIN HFA) 108 (90 Base) MCG/ACT inhaler Inhale 2 puffs into the lungs every 6 (six) hours as needed for wheezing or shortness of breath.     ASPERCREME ORIGINAL 10 % cream Apply topically.     ASPIRIN LOW DOSE 81 MG EC tablet Take 81 mg by mouth daily.     atorvastatin (LIPITOR) 40 MG tablet Take 40 mg by mouth at bedtime.     benztropine (COGENTIN) 1 MG tablet Take 1 mg by mouth at bedtime.     citalopram (CELEXA) 20 MG tablet Take 20 mg by mouth every morning.     clobetasol cream (TEMOVATE) 0.05 % Apply to chest area twice daily as needed for itching/rash 30 g 1   doxycycline (VIBRA-TABS) 100 MG tablet Take 1 tablet (100 mg total) by mouth 2 (two) times daily. 56 tablet 0   Dulaglutide (TRULICITY) 1.5 UK/0.2RK SOPN Inject 1.5 mg into the skin every Friday.     Fluticasone-Umeclidin-Vilant (TRELEGY ELLIPTA) 100-62.5-25 MCG/INH AEPB Inhale 1 puff into the lungs daily.     gabapentin (NEURONTIN) 300 MG capsule Take 300 mg by mouth 2 (two) times daily.     insulin detemir (LEVEMIR) 100 UNIT/ML injection Inject 30 Units into the skin daily.     latanoprost (XALATAN) 0.005 % ophthalmic solution Place 1 drop into both eyes daily.     lidocaine (LIDODERM) 5 % Place 2 patches onto the skin See admin instructions. Apply 1 patch to each foot every 12 hours     Loperamide HCl (IMODIUM PO) SMARTSIG:1 Capsule(s) By Mouth 1-2 Times Daily PRN     LORazepam (ATIVAN) 1 MG tablet Take 1 mg by mouth 2 (two) times daily.     LYRICA 100 MG capsule Take 100 mg by mouth 2 (two) times daily.     osimertinib mesylate (TAGRISSO) 40 MG tablet Take 1 tablet (40 mg total) by mouth daily. Do not start until given okay by oncologist. 30 tablet 2   prochlorperazine (COMPAZINE) 10 MG tablet Take 1 tablet (10 mg total) by mouth every 6 (six) hours as needed (Nausea or vomiting). 60 tablet 1    SENNA PLUS 8.6-50 MG tablet Take 1 tablet by mouth at bedtime.     traMADol (ULTRAM) 50 MG tablet Take 50 mg by mouth every 6 (six) hours as needed.     ziprasidone (GEODON) 80 MG capsule Take 80 mg by mouth at bedtime.     No current facility-administered medications for this visit.   Facility-Administered Medications Ordered in Other Visits  Medication Dose Route Frequency Provider Last Rate Last Admin   sodium chloride flush (NS) 0.9 % injection 10 mL  10 mL Intracatheter Once PRN Lloyd Huger, MD        OBJECTIVE: Vitals:   07/05/21 1008  BP: (!) 127/58  Pulse: 97  Resp: 18  Temp: 97.9 F (36.6  C)  SpO2: 93%     Body mass index is 42.47 kg/m.    ECOG FS:0 - Asymptomatic  General: Well-developed, well-nourished, no acute distress.  Sitting in wheelchair. Eyes: Pink conjunctiva, anicteric sclera. HEENT: Normocephalic, moist mucous membranes. Lungs: No audible wheezing or coughing. Heart: Regular rate and rhythm. Abdomen: Soft, nontender, no obvious distention. Musculoskeletal: No edema, cyanosis, or clubbing. Neuro: Alert, answering all questions appropriately. Cranial nerves grossly intact. Skin: No rashes or petechiae noted. Psych: Normal affect.   LAB RESULTS:  Lab Results  Component Value Date   NA 134 (L) 07/05/2021   K 4.1 07/05/2021   CL 100 07/05/2021   CO2 24 07/05/2021   GLUCOSE 172 (H) 07/05/2021   BUN 10 07/05/2021   CREATININE 1.35 (H) 07/05/2021   CALCIUM 8.9 07/05/2021   PROT 7.5 07/05/2021   ALBUMIN 4.1 07/05/2021   AST 28 07/05/2021   ALT 21 07/05/2021   ALKPHOS 168 (H) 07/05/2021   BILITOT 0.7 07/05/2021   GFRNONAA 44 (L) 07/05/2021   GFRAA >60 01/30/2020    Lab Results  Component Value Date   WBC 6.4 07/05/2021   NEUTROABS 4.6 07/05/2021   HGB 8.6 (L) 07/05/2021   HCT 26.8 (L) 07/05/2021   MCV 91.5 07/05/2021   PLT 116 (L) 07/05/2021     STUDIES: No results found.  ASSESSMENT: Progressive stage IV non-small cell  carcinoma of the left lower lobe lung.  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  PLAN:    1.  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  Patient also noted to have a right iliac bony hypermetabolism as well as a soft tissue mass adjacent suspicious for isolated metastasis.  Soft tissue mass was biopsied on May 19, 2020 which was negative for malignancy.  Patient underwent resection on July 12, 2020 which revealed 4 positive lymph nodes.  MRI of the brain on August 25, 2020 was negative for metastatic disease.  She completed adjuvant cisplatin and pemetrexed on Oct 28, 2020.  Repeat PET scan results from January 27, 2021 reviewed independently with progressive disease with new metastatic adenopathy in the chest, liver metastasis, and multifocal bone metastasis.  Patient wishes to continue with aggressive chemotherapy and given her EGFR mutation was initiated on Tagrisso in September 2022.  Continue treatment until intolerable side effects or progression of disease.  Tagrisso has been dose reduced to 40 mg daily secondary to persistent anemia and thrombocytopenia.  Proceed with treatment today.  Return to clinic in 4 weeks for further evaluation and continuation of treatment.  We will get PET scan prior to next clinic appointment.  Appreciate clinical pharmacy input.  2.  Anemia: Hemoglobin is trended down slightly to 8.6, monitor. 3.  Hyperglycemia: Blood glucose remains chronically elevated.  Continue monitoring and treatment per primary care.   4.  Neuropathy: Chronic and unchanged.  Continue Lyrica as prescribed. 5.  Thrombocytopenia: Mild.  Patient's platelet count was 116 today.  Continue with dose reduced Tagrisso as above. 6.  Bony lesions: Calcium levels adequate  to proceed with Zometa as scheduled.   7.  Renal insufficiency: Chronic and unchanged.  Monitor.  Patient expressed understanding and was in agreement with this plan. She also understands that She can call clinic at any time with any questions, concerns,  or complaints.    Cancer Staging  Primary adenocarcinoma of lower lobe of left lung [facility]) Staging form: Lung, AJCC 8th Edition - Pathologic stage from 07/27/2020: Stage IVB (pT2b, pN1, cM1c) - Signed by Lloyd Huger, MD on 02/02/2021   Lloyd Huger, MD   07/05/2021 12:50 PM

## 2021-07-05 ENCOUNTER — Inpatient Hospital Stay: Payer: Medicaid Other | Attending: Oncology | Admitting: Oncology

## 2021-07-05 ENCOUNTER — Other Ambulatory Visit: Payer: Self-pay

## 2021-07-05 ENCOUNTER — Inpatient Hospital Stay: Payer: Medicaid Other

## 2021-07-05 ENCOUNTER — Encounter: Payer: Self-pay | Admitting: Oncology

## 2021-07-05 ENCOUNTER — Inpatient Hospital Stay: Payer: Medicaid Other | Admitting: Pharmacist

## 2021-07-05 VITALS — BP 127/58 | HR 97 | Temp 97.9°F | Resp 18 | Wt 232.2 lb

## 2021-07-05 DIAGNOSIS — C3432 Malignant neoplasm of lower lobe, left bronchus or lung: Secondary | ICD-10-CM

## 2021-07-05 DIAGNOSIS — Z95828 Presence of other vascular implants and grafts: Secondary | ICD-10-CM

## 2021-07-05 DIAGNOSIS — C7951 Secondary malignant neoplasm of bone: Secondary | ICD-10-CM | POA: Insufficient documentation

## 2021-07-05 LAB — CBC WITH DIFFERENTIAL/PLATELET
Abs Immature Granulocytes: 0.08 10*3/uL — ABNORMAL HIGH (ref 0.00–0.07)
Basophils Absolute: 0 10*3/uL (ref 0.0–0.1)
Basophils Relative: 0 %
Eosinophils Absolute: 0 10*3/uL (ref 0.0–0.5)
Eosinophils Relative: 1 %
HCT: 26.8 % — ABNORMAL LOW (ref 36.0–46.0)
Hemoglobin: 8.6 g/dL — ABNORMAL LOW (ref 12.0–15.0)
Immature Granulocytes: 1 %
Lymphocytes Relative: 23 %
Lymphs Abs: 1.5 10*3/uL (ref 0.7–4.0)
MCH: 29.4 pg (ref 26.0–34.0)
MCHC: 32.1 g/dL (ref 30.0–36.0)
MCV: 91.5 fL (ref 80.0–100.0)
Monocytes Absolute: 0.3 10*3/uL (ref 0.1–1.0)
Monocytes Relative: 4 %
Neutro Abs: 4.6 10*3/uL (ref 1.7–7.7)
Neutrophils Relative %: 71 %
Platelets: 116 10*3/uL — ABNORMAL LOW (ref 150–400)
RBC: 2.93 MIL/uL — ABNORMAL LOW (ref 3.87–5.11)
RDW: 17.6 % — ABNORMAL HIGH (ref 11.5–15.5)
WBC: 6.4 10*3/uL (ref 4.0–10.5)
nRBC: 0 % (ref 0.0–0.2)

## 2021-07-05 LAB — COMPREHENSIVE METABOLIC PANEL
ALT: 21 U/L (ref 0–44)
AST: 28 U/L (ref 15–41)
Albumin: 4.1 g/dL (ref 3.5–5.0)
Alkaline Phosphatase: 168 U/L — ABNORMAL HIGH (ref 38–126)
Anion gap: 10 (ref 5–15)
BUN: 10 mg/dL (ref 8–23)
CO2: 24 mmol/L (ref 22–32)
Calcium: 8.9 mg/dL (ref 8.9–10.3)
Chloride: 100 mmol/L (ref 98–111)
Creatinine, Ser: 1.35 mg/dL — ABNORMAL HIGH (ref 0.44–1.00)
GFR, Estimated: 44 mL/min — ABNORMAL LOW (ref >60)
Glucose, Bld: 172 mg/dL — ABNORMAL HIGH (ref 70–99)
Potassium: 4.1 mmol/L (ref 3.5–5.1)
Sodium: 134 mmol/L — ABNORMAL LOW (ref 135–145)
Total Bilirubin: 0.7 mg/dL (ref 0.3–1.2)
Total Protein: 7.5 g/dL (ref 6.5–8.1)

## 2021-07-05 LAB — MAGNESIUM: Magnesium: 2.2 mg/dL (ref 1.7–2.4)

## 2021-07-05 MED ORDER — SODIUM CHLORIDE 0.9 % IV SOLN
Freq: Once | INTRAVENOUS | Status: AC
  Filled 2021-07-05: qty 250

## 2021-07-05 MED ORDER — SODIUM CHLORIDE 0.9% FLUSH
10.0000 mL | INTRAVENOUS | Status: DC | PRN
  Administered 2021-07-05: 10 mL via INTRAVENOUS
  Filled 2021-07-05: qty 10

## 2021-07-05 MED ORDER — ZOLEDRONIC ACID 4 MG/100ML IV SOLN
4.0000 mg | Freq: Once | INTRAVENOUS | Status: AC
  Administered 2021-07-05: 4 mg via INTRAVENOUS
  Filled 2021-07-05: qty 100

## 2021-07-05 MED ORDER — HEPARIN SOD (PORK) LOCK FLUSH 100 UNIT/ML IV SOLN
INTRAVENOUS | Status: AC
  Administered 2021-07-05: 500 [IU]
  Filled 2021-07-05: qty 5

## 2021-07-05 MED ORDER — HEPARIN SOD (PORK) LOCK FLUSH 100 UNIT/ML IV SOLN
500.0000 [IU] | Freq: Once | INTRAVENOUS | Status: AC | PRN
  Filled 2021-07-05: qty 5

## 2021-07-05 MED ORDER — SODIUM CHLORIDE 0.9% FLUSH
10.0000 mL | Freq: Once | INTRAVENOUS | Status: DC | PRN
  Filled 2021-07-05: qty 10

## 2021-07-05 MED ORDER — HEPARIN SOD (PORK) LOCK FLUSH 100 UNIT/ML IV SOLN
500.0000 [IU] | Freq: Once | INTRAVENOUS | Status: AC
  Administered 2021-07-05: 500 [IU] via INTRAVENOUS
  Filled 2021-07-05: qty 5

## 2021-07-05 NOTE — Patient Instructions (Signed)

## 2021-07-05 NOTE — Progress Notes (Signed)
[place]  [phone] Fax:([phone]  Patient Care Team: Center, [place] as PCP - General Telford Nab, RN as Oncology Nurse Navigator Grayland Ormond, Kathlene November, MD as Consulting Physician (Hematology and Oncology)   Name of the patient: Natalie [name]  [id number]  1960/10/05   Date of visit: 07/05/21  HPI: Patient is a 61 y.o. 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 Currently treated with Tagrisso (osimertinib), which she started on 02/19/21.   Reason for Consult: Oral chemotherapy follow-up for osimertinib therapy.   PAST MEDICAL HISTORY: Past Medical History:  Diagnosis Date   Asthma    COPD (chronic obstructive pulmonary disease) ([place])    CVA (cerebral vascular accident) ([place]) 2014   Depression    Diabetes mellitus ([place])    Diabetes mellitus without complication ([place])    Hepatitis C    History of kidney stones    Hyperlipidemia    Iron deficiency anemia    Obesity    OSA on CPAP    Mild, noncompliant with CPAP said cpap machine was recalled, is waiting for a replacement    Paranoia ([place])    Schizoaffective disorder, bipolar type [facility])     HEMATOLOGY/ONCOLOGY HISTORY:  Oncology History  Primary adenocarcinoma of lower lobe of left lung ([place])  05/08/2020 Initial Diagnosis   Primary adenocarcinoma of lower lobe of left lung ([place])   07/27/2020 Cancer Staging   Staging form: Lung, AJCC 8th Edition - Pathologic stage from 07/27/2020: Stage IVB (pT2b, pN1, cM1c) - Signed by Lloyd Huger, MD on 02/02/2021    08/26/2020 - 10/28/2020 Chemotherapy   Patient is on Treatment Plan : LUNG NSCLC Pemetrexed ([place]) + Cisplatin q21d x 4 cycles       ALLERGIES:  has No Known Allergies.  MEDICATIONS:  Current Outpatient Medications  Medication Sig Dispense Refill   acetaminophen (TYLENOL) 650 MG CR tablet Take 650 mg by mouth 2 (two) times daily.      albuterol (VENTOLIN HFA) 108 (90 Base) MCG/ACT inhaler Inhale 2 puffs into the lungs every 6 (six) hours as needed for wheezing or shortness of breath.     ASPERCREME ORIGINAL 10 % cream Apply topically.     ASPIRIN LOW DOSE 81 MG EC tablet Take 81 mg by mouth daily.     atorvastatin (LIPITOR) 40 MG tablet Take 40 mg by mouth at bedtime.     benztropine (COGENTIN) 1 MG tablet Take 1 mg by mouth at bedtime.     citalopram (CELEXA) 20 MG tablet Take 20 mg by mouth every morning.     clobetasol cream (TEMOVATE) 0.05 % Apply to chest area twice daily as needed for itching/rash 30 g 1   doxycycline (VIBRA-TABS) 100 MG tablet Take 1 tablet (100 mg total) by mouth 2 (two) times daily. 56 tablet 0   Dulaglutide (TRULICITY) 1.5 GY/5.6LS SOPN Inject 1.5 mg into the skin every Friday.     Fluticasone-Umeclidin-Vilant (TRELEGY ELLIPTA) 100-62.5-25 MCG/INH AEPB Inhale 1 puff into the lungs daily.     gabapentin (NEURONTIN) 300 MG capsule Take 300 mg by mouth 2 (two) times daily.     insulin detemir (LEVEMIR) 100 UNIT/ML injection Inject 30 Units into the skin daily.     latanoprost (XALATAN) 0.005 % ophthalmic solution Place 1 drop into both eyes daily.     lidocaine (LIDODERM) 5 % Place 2 patches onto the skin See admin instructions. Apply 1 patch to each foot  every 12 hours     Loperamide HCl (IMODIUM PO) SMARTSIG:1 Capsule(s) By Mouth 1-2 Times Daily PRN     LORazepam (ATIVAN) 1 MG tablet Take 1 mg by mouth 2 (two) times daily.     LYRICA 100 MG capsule Take 100 mg by mouth 2 (two) times daily.     osimertinib mesylate (TAGRISSO) 40 MG tablet Take 1 tablet (40 mg total) by mouth daily. Do not start until given okay by oncologist. 30 tablet 2   prochlorperazine (COMPAZINE) 10 MG tablet Take 1 tablet (10 mg total) by mouth every 6 (six) hours as needed (Nausea or vomiting). 60 tablet 1   SENNA PLUS 8.6-50 MG tablet Take 1 tablet by mouth at bedtime.     traMADol (ULTRAM) 50 MG tablet Take 50 mg by mouth  every 6 (six) hours as needed.     ziprasidone (GEODON) 80 MG capsule Take 80 mg by mouth at bedtime.     No current facility-administered medications for this visit.    VITAL SIGNS: There were no vitals taken for this visit. There were no vitals filed for this visit.  Estimated body mass index is 42.47 kg/m as calculated from the following:   Height as of 06/02/21: _0  (1.575 m).   Weight as of an earlier encounter on 07/05/21: 105.3 kg (232 lb 3.2 oz).  LABS: CBC:    Component Value Date/Time   WBC 6.4 07/05/2021 0954   HGB 8.6 (L) 07/05/2021 0954   HCT 26.8 (L) 07/05/2021 0954   PLT 116 (L) 07/05/2021 0954   MCV 91.5 07/05/2021 0954   NEUTROABS 4.6 07/05/2021 0954   LYMPHSABS 1.5 07/05/2021 0954   MONOABS 0.3 07/05/2021 0954   EOSABS 0.0 07/05/2021 0954   BASOSABS 0.0 07/05/2021 0954   Comprehensive Metabolic Panel:    Component Value Date/Time   NA 137 05/04/2021 1000   K 3.9 05/04/2021 1000   CL 103 05/04/2021 1000   CO2 24 05/04/2021 1000   BUN 13 05/04/2021 1000   CREATININE 1.13 (H) 05/04/2021 1000   GLUCOSE 152 (H) 05/04/2021 1000   CALCIUM 9.0 05/04/2021 1000   AST 31 05/04/2021 1000   ALT 26 05/04/2021 1000   ALKPHOS 119 05/04/2021 1000   BILITOT 0.5 05/04/2021 1000   PROT 7.3 05/04/2021 1000   ALBUMIN 3.9 05/04/2021 1000     Present during today's visit: patient only  Assessment and Plan: Reviewed plt and hgb trend with patient, will continue to monitor  Continue osimertinib 17m daily  Repeat imaging prior to next office visit   Oral Chemotherapy Side Effect/Intolerance:  Edema: occasional edema in left ankle, not impacting movement Shortness of breath: Patient reports an increase in shortness of breath when walking around and after climbing stairs. After stairs, she reports needing to sit down and rest for 3-4 mins. [facility]), she tries to remain as active as possible, continue to monitor No reported  fatigue, nail changes, or rash  Oral Chemotherapy Adherence: no missed doses since starting No patient barriers to medication adherence identified.   New medications: None reported  Medication Access Issues: no issues, [facility]  Patient expressed understanding and was in agreement with this plan. She also understands that She can call clinic at any time with any questions, concerns, or complaints.   Follow-up plan: RTC in 1 month  Thank you for allowing me to participate in the care of this very pleasant patient.  Time Total: 15 mins  Visit consisted of counseling and education on dealing with issues of symptom management in the setting of serious and potentially life-threatening illness.Greater than 50%  of this time was spent counseling and coordinating care related to the above assessment and plan.  Signed by: Darl Pikes, PharmD, BCPS, Salley Slaughter, CPP Hematology/Oncology Clinical Pharmacist Practitioner [place]/HP/AP [facility] [phone]  07/05/2021 10:17 AM

## 2021-07-27 ENCOUNTER — Encounter
Admission: RE | Admit: 2021-07-27 | Discharge: 2021-07-27 | Disposition: A | Discharge status: Home / Self Care | Payer: Medicaid Other | Source: Ambulatory Visit | Attending: Oncology | Admitting: Oncology

## 2021-07-27 DIAGNOSIS — C3432 Malignant neoplasm of lower lobe, left bronchus or lung: Secondary | ICD-10-CM | POA: Insufficient documentation

## 2021-07-27 DIAGNOSIS — C787 Secondary malignant neoplasm of liver and intrahepatic bile duct: Secondary | ICD-10-CM | POA: Insufficient documentation

## 2021-07-27 DIAGNOSIS — J90 Pleural effusion, not elsewhere classified: Secondary | ICD-10-CM | POA: Insufficient documentation

## 2021-07-27 DIAGNOSIS — C7951 Secondary malignant neoplasm of bone: Secondary | ICD-10-CM | POA: Insufficient documentation

## 2021-07-27 LAB — GLUCOSE, CAPILLARY: Glucose-Capillary: 104 mg/dL — ABNORMAL HIGH (ref 70–99)

## 2021-07-27 IMAGING — CT NM PET TUM IMG RESTAG (PS) SKULL BASE T - THIGH
1 of 9 series · 2 of 25 positions shown · non-contrast
Comparison: [DATE]

CLINICAL DATA: Subsequent treatment strategy for adenocarcinoma of
the left lower lobe.

EXAM:
NUCLEAR MEDICINE PET SKULL BASE TO THIGH
TECHNIQUE: 12.8 mCi F-18 FDG was injected intravenously. Full-ring PET imaging
was performed from the skull base to thigh after the radiotracer. CT
data was obtained and used for attenuation correction and anatomic
localization.
Fasting blood glucose: 104 mg/dl

[Series 3: ct wb 5.0 b30f · axial · 5.0mm · 0.98mm/px · z∈[-1039,-604]mm · 2 of 290 slices shown]
[im 145/290  brain]
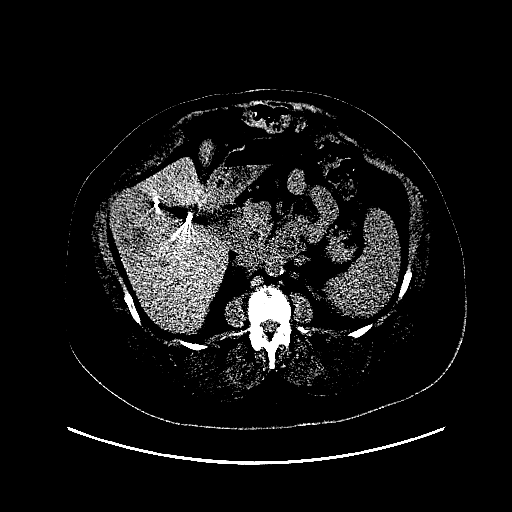
[im 290/290  brain]
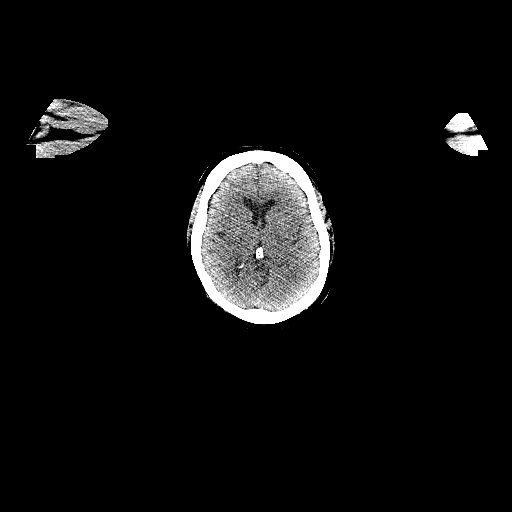

[2 of 25 positions shown; findings below may reference images not displayed]

FINDINGS: Mediastinal blood pool activity: SUV max

Liver activity: SUV max NA

NECK: No hypermetabolic lymph nodes in the neck.

Incidental CT findings: none

CHEST: Hypermetabolic pre-vascular lymphadenopathy identified with
SUV max = 5.3.

New hypermetabolic right internal mammary node (81/3) with SUV max =
5.0.

New tiny hypermetabolic lymph node identified left axilla with SUV
max =

New focal hypermetabolism identified left infrahilar region with SUV
max = 6.5 corresponding to a amorphous soft tissue in the region of
the surgical bed.

Hypermetabolism along the left pleura right dental CHONG LEONG with
hypermetabolic left posterior pleural nodule measuring 1.4 cm
(118/3) demonstrating SUV max = 5.4.

Incidental CT findings: Heart size upper normal. Trace pericardial
effusion. Right Port-A-Cath tip is in the low right atrium. Coronary
artery calcification is evident. Mild atherosclerotic calcification
is noted in the wall of the thoracic aorta. Stable volume loss left
hemithorax consistent with prior surgery. Small loculated pleural
effusion noted inferior left hemithorax similar to prior.

ABDOMEN/PELVIS: Interval progression of pre-existing liver
metastases with new metastatic disease identified in the liver on
today's study.

New 4.0 cm mass in the anterior hepatic dome demonstrates SUV max =
11.9.

New 4.7 cm mass posterior right hepatic dome is new in the interval
with SUV max = 9.4.

Previous mass in posterior segment II measures 5.1 cm, increased
from 1.9 cm previously with SUV max = 12.6 today versus 11.8 on
prior.

New inferior right hepatic lobe lesion demonstrates SUV max = 9.4.

No evidence for hypermetabolic lymphadenopathy in the abdomen.

2.4 cm hypermetabolic metastatic lesion identified inferior left
trapezius muscle on [DATE] [DATE] with SUV max = 7.1.

Incidental CT findings: Sequelae of prior ventral hernia repair with
persistent fascial laxity and diastasis of the rectus sheath.
Ovarian dermoid in the right anterior pelvis is similar to prior.
Small gas locule anterior subcutaneous fat of the right abdominal
wall is compatible with an injection site. There is moderate
atherosclerotic calcification of the abdominal aorta without
aneurysm. Small fluid collection subcutaneous fat lateral left
abdominal wall is stable.

SKELETON: Numerous new bone metastases are identified

17 mm index lesion anterior left iliac bone on 204/3 with SUV max =
10.9.

Intensely hypermetabolic sacral lesions are new in the interval with
progression of the posterior right iliac bone hypermetabolic
metastasis.

Multiple new thoracolumbar bony metastases with diffuse involvement
of the T12 vertebral body ( SUV max = 8.0 progressive from small
right-sided T12 lesion on the prior study.

Inferior left scapular lesion with demonstrated SUV max =
previously is 6.4 today.

Left humeral head lesion with demonstrated SUV max = 7.2 previously
is 8.5 today.

New hypermetabolic lesion right humeral head with SUV max = 5.9.

Sternal lesion measured previously at SUV max = 4.3 demonstrates SUV
max = 8.5 today.

Incidental CT findings: none
IMPRESSION: 1. Overall generalized progression of disease with new
hypermetabolic lymph nodes in the chest, progressive in new liver
metastases, and progressive in new bone metastases.
2. Similar appearance of the loculated left pleural effusion with
hypermetabolic nodular pleural disease in the left hemithorax.
3. New hypermetabolic muscular metastatic lesion in the inferior
left trapezius muscle. Similar appearance hypermetabolic lesion in
the right flank identified previously.
4.  Aortic Atherosclerois ([52]-170.0)

## 2021-07-27 MED ORDER — FLUDEOXYGLUCOSE F - 18 (FDG) INJECTION
12.7500 | Freq: Once | INTRAVENOUS | Status: AC | PRN
  Administered 2021-07-27: 12.75 via INTRAVENOUS

## 2021-08-01 NOTE — Progress Notes (Signed)
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   INTERVAL HISTORY: Patient returns to clinic today for repeat laboratory work, further evaluation, and discussion of her imaging results.  She continues to feel well and remains asymptomatic. She has no neurologic complaints.  She denies any recent fevers or illnesses.  She has a good appetite and denies weight loss.  She has no chest pain, shortness of breath, cough, or hemoptysis.  She denies any nausea, vomiting, constipation, or diarrhea.  She has no urinary complaints.  Patient offers no specific complaints today.  REVIEW OF SYSTEMS:   Review of Systems  Constitutional: Negative.  Negative for fever, malaise/fatigue and weight loss.  Respiratory: Negative.  Negative for cough, hemoptysis and shortness of breath.   Cardiovascular: Negative.  Negative for chest pain and leg swelling.  Gastrointestinal: Negative.  Negative for abdominal pain.  Genitourinary: Negative.  Negative for dysuria and flank pain.  Musculoskeletal: Negative.  Negative for back pain and joint pain.  Skin: Negative.  Negative for rash.  Neurological: Negative.  Negative for dizziness, focal weakness, weakness and headaches.  Psychiatric/Behavioral: Negative.  The patient is not nervous/anxious.    As per HPI. Otherwise, a complete review of systems is negative.  PAST MEDICAL HISTORY: Past Medical History:  Diagnosis Date   Asthma    COPD (chronic obstructive pulmonary disease) ([place])    CVA (cerebral vascular accident) ([place]) 2014   Depression    Diabetes mellitus ([place])     Diabetes mellitus without complication ([place])    Hepatitis C    History of kidney stones    Hyperlipidemia    Iron deficiency anemia    Obesity    OSA on CPAP    Mild, noncompliant with CPAP said cpap machine was recalled, is waiting for a replacement    Paranoia ([place])    Schizoaffective disorder, bipolar type ([place])     PAST SURGICAL HISTORY: Past Surgical History:  Procedure Laterality Date   BREAST CYST EXCISION Bilateral 2013   Benign   CESAREAN SECTION     CHOLECYSTECTOMY     COLONOSCOPY     ESOPHAGOGASTRODUODENOSCOPY  2020   done in [place] Left 2002   INTERCOSTAL NERVE BLOCK N/A 07/12/2020   Procedure: INTERCOSTAL NERVE BLOCK;  Surgeon: Melrose Nakayama, MD;  Location: [facility];  Service: Thoracic;  Laterality: N/A;   IR IMAGING GUIDED PORT INSERTION  08/20/2020   LYMPH NODE DISSECTION Left 07/12/2020   Procedure: LYMPH NODE DISSECTION;  Surgeon: Melrose Nakayama, MD;  Location: [place];  Service: Thoracic;  Laterality: Left;   UMBILICAL HERNIA REPAIR     VIDEO BRONCHOSCOPY WITH ENDOBRONCHIAL NAVIGATION N/A 06/07/2020   Procedure: VIDEO BRONCHOSCOPY WITH ENDOBRONCHIAL NAVIGATION;  Surgeon: Tyler Pita, MD;  Location: [facility];  Service: Pulmonary;  Laterality: N/A;    FAMILY HISTORY: History reviewed. No pertinent family history.  ADVANCED DIRECTIVES (Y/N):  N  HEALTH MAINTENANCE: Social History   Tobacco Use   Smoking status: Former    Packs/day: 2.00    Years: 38.00    Pack years: 76.00    Types: Cigarettes    Quit date: 2012  Years since quitting: 11.1   Smokeless tobacco: Former  Scientific laboratory technician Use: Never used  Substance Use Topics   Alcohol use: Yes    Comment: occassionaly   Drug use: Not Currently     Colonoscopy:  PAP:  Bone density:  Lipid panel:  No Known Allergies  Current Outpatient Medications  Medication Sig Dispense Refill   acetaminophen (TYLENOL) 650 MG CR tablet Take 650 mg by mouth 2 (two)  times daily.     albuterol (VENTOLIN HFA) 108 (90 Base) MCG/ACT inhaler Inhale 2 puffs into the lungs every 6 (six) hours as needed for wheezing or shortness of breath.     ASPERCREME ORIGINAL 10 % cream Apply topically.     ASPIRIN LOW DOSE 81 MG EC tablet Take 81 mg by mouth daily.     atorvastatin (LIPITOR) 40 MG tablet Take 40 mg by mouth at bedtime.     benztropine (COGENTIN) 1 MG tablet Take 1 mg by mouth at bedtime.     citalopram (CELEXA) 20 MG tablet Take 20 mg by mouth every morning.     clobetasol cream (TEMOVATE) 0.05 % Apply to chest area twice daily as needed for itching/rash 30 g 1   doxycycline (VIBRA-TABS) 100 MG tablet Take 1 tablet (100 mg total) by mouth 2 (two) times daily. 56 tablet 0   Dulaglutide (TRULICITY) 1.5 JS/9.7WY SOPN Inject 1.5 mg into the skin every Friday.     Fluticasone-Umeclidin-Vilant (TRELEGY ELLIPTA) 100-62.5-25 MCG/INH AEPB Inhale 1 puff into the lungs daily.     gabapentin (NEURONTIN) 300 MG capsule Take 300 mg by mouth 2 (two) times daily.     insulin detemir (LEVEMIR) 100 UNIT/ML injection Inject 30 Units into the skin daily.     latanoprost (XALATAN) 0.005 % ophthalmic solution Place 1 drop into both eyes daily.     lidocaine (LIDODERM) 5 % Place 2 patches onto the skin See admin instructions. Apply 1 patch to each foot every 12 hours     Loperamide HCl (IMODIUM PO) SMARTSIG:1 Capsule(s) By Mouth 1-2 Times Daily PRN     LORazepam (ATIVAN) 1 MG tablet Take 1 mg by mouth 2 (two) times daily.     LYRICA 100 MG capsule Take 100 mg by mouth 2 (two) times daily.     osimertinib mesylate (TAGRISSO) 40 MG tablet Take 1 tablet (40 mg total) by mouth daily. Do not start until given okay by oncologist. 30 tablet 2   prochlorperazine (COMPAZINE) 10 MG tablet Take 1 tablet (10 mg total) by mouth every 6 (six) hours as needed (Nausea or vomiting). 60 tablet 1   SENNA PLUS 8.6-50 MG tablet Take 1 tablet by mouth at bedtime.     traMADol (ULTRAM) 50 MG tablet Take  50 mg by mouth every 6 (six) hours as needed.     ziprasidone (GEODON) 80 MG capsule Take 80 mg by mouth at bedtime.     lidocaine-prilocaine (EMLA) cream Apply to affected area once 30 g 3   No current facility-administered medications for this visit.    OBJECTIVE: Vitals:   08/02/21 0955  BP: 132/65  Pulse: 89  Temp: 97.6 F (36.4 C)     Body mass index is 42.18 kg/m.    ECOG FS:0 - Asymptomatic  General: Well-developed, well-nourished, no acute distress.  Sitting in a wheelchair. Eyes: Pink conjunctiva, anicteric sclera. HEENT: Normocephalic, moist mucous membranes. Lungs: No audible wheezing or coughing. Heart: Regular rate and rhythm. Abdomen: Soft, nontender, no  obvious distention. Musculoskeletal: No edema, cyanosis, or clubbing. Neuro: Alert, answering all questions appropriately. Cranial nerves grossly intact. Skin: No rashes or petechiae noted. Psych: Normal affect.    LAB RESULTS:  Lab Results  Component Value Date   NA 134 (L) 08/02/2021   K 4.0 08/02/2021   CL 100 08/02/2021   CO2 25 08/02/2021   GLUCOSE 103 (H) 08/02/2021   BUN 19 08/02/2021   CREATININE 1.27 (H) 08/02/2021   CALCIUM 8.9 08/02/2021   PROT 6.9 08/02/2021   ALBUMIN 3.8 08/02/2021   AST 32 08/02/2021   ALT 21 08/02/2021   ALKPHOS 144 (H) 08/02/2021   BILITOT 0.4 08/02/2021   GFRNONAA 48 (L) 08/02/2021   GFRAA >60 01/30/2020    Lab Results  Component Value Date   WBC 6.1 08/02/2021   NEUTROABS 4.2 08/02/2021   HGB 8.6 (L) 08/02/2021   HCT 27.3 (L) 08/02/2021   MCV 90.1 08/02/2021   PLT 117 (L) 08/02/2021     STUDIES: NM PET Image Restage (PS) Skull Base to Thigh (F-18 FDG)  Result Date: 07/28/2021 CLINICAL DATA:  Subsequent treatment strategy for adenocarcinoma of the left lower lobe. EXAM: NUCLEAR MEDICINE PET SKULL BASE TO THIGH TECHNIQUE: 12.8 mCi F-18 FDG was injected intravenously. Full-ring PET imaging was performed from the skull base to thigh after the radiotracer. CT  data was obtained and used for attenuation correction and anatomic localization. Fasting blood glucose: 104 mg/dl COMPARISON:  01/26/2021 FINDINGS: Mediastinal blood pool activity: SUV max 2.6 Liver activity: SUV max NA NECK: No hypermetabolic lymph nodes in the neck. Incidental CT findings: none CHEST: Hypermetabolic pre-vascular lymphadenopathy identified with SUV max = 5.3. New hypermetabolic right internal mammary node (81/3) with SUV max = 5.0. New tiny hypermetabolic lymph node identified left axilla with SUV max = 3.0 New focal hypermetabolism identified left infrahilar region with SUV max = 6.5 corresponding to a amorphous soft tissue in the region of the surgical bed. Hypermetabolism along the left pleura right dental fide with hypermetabolic left posterior pleural nodule measuring 1.4 cm (118/3) demonstrating SUV max = 5.4. Incidental CT findings: Heart size upper normal. Trace pericardial effusion. Right Port-A-Cath tip is in the low right atrium. Coronary artery calcification is evident. Mild atherosclerotic calcification is noted in the wall of the thoracic aorta. Stable volume loss left hemithorax consistent with prior surgery. Small loculated pleural effusion noted inferior left hemithorax similar to prior. ABDOMEN/PELVIS: Interval progression of pre-existing liver metastases with new metastatic disease identified in the liver on today's study. New 4.0 cm mass in the anterior hepatic dome demonstrates SUV max = 11.9. New 4.7 cm mass posterior right hepatic dome is new in the interval with SUV max = 9.4. Previous mass in posterior segment II measures 5.1 cm, increased from 1.9 cm previously with SUV max = 12.6 today versus 11.8 on prior. New inferior right hepatic lobe lesion demonstrates SUV max = 9.4. No evidence for hypermetabolic lymphadenopathy in the abdomen. 2.4 cm hypermetabolic metastatic lesion identified inferior left trapezius muscle on 01/20 5/3 with SUV max = 7.1. Incidental CT findings:  Sequelae of prior ventral hernia repair with persistent fascial laxity and diastasis of the rectus sheath. Ovarian dermoid in the right anterior pelvis is similar to prior. Small gas locule anterior subcutaneous fat of the right abdominal wall is compatible with an injection site. There is moderate atherosclerotic calcification of the abdominal aorta without aneurysm. Small fluid collection subcutaneous fat lateral left abdominal wall is stable. SKELETON: Numerous new bone metastases  are identified 17 mm index lesion anterior left iliac bone on 204/3 with SUV max = 10.9. Intensely hypermetabolic sacral lesions are new in the interval with progression of the posterior right iliac bone hypermetabolic metastasis. Multiple new thoracolumbar bony metastases with diffuse involvement of the T12 vertebral body ( SUV max = 8.0 progressive from small right-sided T12 lesion on the prior study. Inferior left scapular lesion with demonstrated SUV max = 2.9 previously is 6.4 today. Left humeral head lesion with demonstrated SUV max = 7.2 previously is 8.5 today. New hypermetabolic lesion right humeral head with SUV max = 5.9. Sternal lesion measured previously at SUV max = 4.3 demonstrates SUV max = 8.5 today. Incidental CT findings: none IMPRESSION: 1. Overall generalized progression of disease with new hypermetabolic lymph nodes in the chest, progressive in new liver metastases, and progressive in new bone metastases. 2. Similar appearance of the loculated left pleural effusion with hypermetabolic nodular pleural disease in the left hemithorax. 3. New hypermetabolic muscular metastatic lesion in the inferior left trapezius muscle. Similar appearance hypermetabolic lesion in the right flank identified previously. 4.  Aortic Atherosclerois (ICD10-170.0) Electronically Signed   By: Misty Stanley M.D.   On: 07/28/2021 09:30    ONCOLOGY HISTORY: PET scan results from May 06, 2020 reviewed independently with hypermetabolic  left lower lobe lung mass and biopsy confirmed malignancy.  Patient also noted to have a right iliac bony hypermetabolism as well as a soft tissue mass adjacent suspicious for isolated metastasis.  Soft tissue mass was biopsied on May 19, 2020 which was negative for malignancy.  Patient underwent resection on July 12, 2020 which revealed 4 positive lymph nodes.  MRI of the brain on August 25, 2020 was negative for metastatic disease.  She completed adjuvant cisplatin and pemetrexed on Oct 28, 2020.  Repeat PET scan results from January 27, 2021 reviewed independently with progressive disease with new metastatic adenopathy in the chest, liver metastasis, and multifocal bone metastasis.  Given her EGFR mutation was initiated on Tagrisso in September 2022.  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  PLAN:    1.  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  Patient wishes to continue systemic treatment, therefore will discontinue Tagrisso and given that she has a positive PD-L1, will proceed with Tecentriq every 3 weeks until progression of disease.  Return to clinic in 1 week for further evaluation and initiation of cycle 1.  2.  Anemia: Chronic and unchanged.  Patient's hemoglobin is 8.6.   3.  Hyperglycemia: Improved.  Continue monitoring and treatment per primary care.   4.  Neuropathy: Chronic and unchanged.  Continue Lyrica as prescribed. 5.  Thrombocytopenia: Chronic and unchanged.  Patient's platelet count is 178 today.  Discontinue Tagrisso as above. 6.  Bony lesions: Continue with Zometa on the odd-numbered cycles of Tecentriq.   7.  Renal insufficiency: Chronic and  unchanged.  Patient expressed understanding and was in agreement with this plan. She also understands that She can call clinic at any time with any questions, concerns, or complaints.    Cancer Staging  Primary adenocarcinoma of lower lobe of left lung ([place]) Staging form: Lung, AJCC 8th Edition - Pathologic stage  from 07/27/2020: Stage IVB (pT2b, pN1, cM1c) - Signed by Lloyd Huger, MD on 02/02/2021   Lloyd Huger, MD   08/02/2021 2:01 PM

## 2021-08-02 ENCOUNTER — Inpatient Hospital Stay: Payer: Medicaid Other | Admitting: Pharmacist

## 2021-08-02 ENCOUNTER — Inpatient Hospital Stay (HOSPITAL_BASED_OUTPATIENT_CLINIC_OR_DEPARTMENT_OTHER): Payer: Medicaid Other | Admitting: Oncology

## 2021-08-02 ENCOUNTER — Encounter: Payer: Self-pay | Admitting: Oncology

## 2021-08-02 ENCOUNTER — Inpatient Hospital Stay: Payer: Medicaid Other | Attending: Oncology

## 2021-08-02 ENCOUNTER — Inpatient Hospital Stay: Payer: Medicaid Other

## 2021-08-02 ENCOUNTER — Other Ambulatory Visit: Payer: Self-pay

## 2021-08-02 VITALS — BP 132/65 | HR 89 | Temp 97.6°F | Wt 230.6 lb

## 2021-08-02 DIAGNOSIS — Z87442 Personal history of urinary calculi: Secondary | ICD-10-CM | POA: Insufficient documentation

## 2021-08-02 DIAGNOSIS — C3432 Malignant neoplasm of lower lobe, left bronchus or lung: Secondary | ICD-10-CM

## 2021-08-02 DIAGNOSIS — Z8673 Personal history of transient ischemic attack (TIA), and cerebral infarction without residual deficits: Secondary | ICD-10-CM | POA: Insufficient documentation

## 2021-08-02 DIAGNOSIS — D649 Anemia, unspecified: Secondary | ICD-10-CM | POA: Diagnosis not present

## 2021-08-02 DIAGNOSIS — D696 Thrombocytopenia, unspecified: Secondary | ICD-10-CM | POA: Insufficient documentation

## 2021-08-02 DIAGNOSIS — Z5112 Encounter for antineoplastic immunotherapy: Secondary | ICD-10-CM | POA: Diagnosis not present

## 2021-08-02 DIAGNOSIS — G629 Polyneuropathy, unspecified: Secondary | ICD-10-CM | POA: Diagnosis not present

## 2021-08-02 DIAGNOSIS — M899 Disorder of bone, unspecified: Secondary | ICD-10-CM | POA: Insufficient documentation

## 2021-08-02 DIAGNOSIS — N189 Chronic kidney disease, unspecified: Secondary | ICD-10-CM | POA: Diagnosis not present

## 2021-08-02 DIAGNOSIS — E1165 Type 2 diabetes mellitus with hyperglycemia: Secondary | ICD-10-CM | POA: Diagnosis not present

## 2021-08-02 DIAGNOSIS — Z87891 Personal history of nicotine dependence: Secondary | ICD-10-CM | POA: Diagnosis not present

## 2021-08-02 LAB — COMPREHENSIVE METABOLIC PANEL
ALT: 21 U/L (ref 0–44)
AST: 32 U/L (ref 15–41)
Albumin: 3.8 g/dL (ref 3.5–5.0)
Alkaline Phosphatase: 144 U/L — ABNORMAL HIGH (ref 38–126)
Anion gap: 9 (ref 5–15)
BUN: 19 mg/dL (ref 8–23)
CO2: 25 mmol/L (ref 22–32)
Calcium: 8.9 mg/dL (ref 8.9–10.3)
Chloride: 100 mmol/L (ref 98–111)
Creatinine, Ser: 1.27 mg/dL — ABNORMAL HIGH (ref 0.44–1.00)
GFR, Estimated: 48 mL/min — ABNORMAL LOW (ref >60)
Glucose, Bld: 103 mg/dL — ABNORMAL HIGH (ref 70–99)
Potassium: 4 mmol/L (ref 3.5–5.1)
Sodium: 134 mmol/L — ABNORMAL LOW (ref 135–145)
Total Bilirubin: 0.4 mg/dL (ref 0.3–1.2)
Total Protein: 6.9 g/dL (ref 6.5–8.1)

## 2021-08-02 LAB — CBC WITH DIFFERENTIAL/PLATELET
Abs Immature Granulocytes: 0.04 10*3/uL (ref 0.00–0.07)
Basophils Absolute: 0 10*3/uL (ref 0.0–0.1)
Basophils Relative: 0 %
Eosinophils Absolute: 0 10*3/uL (ref 0.0–0.5)
Eosinophils Relative: 1 %
HCT: 27.3 % — ABNORMAL LOW (ref 36.0–46.0)
Hemoglobin: 8.6 g/dL — ABNORMAL LOW (ref 12.0–15.0)
Immature Granulocytes: 1 %
Lymphocytes Relative: 26 %
Lymphs Abs: 1.6 10*3/uL (ref 0.7–4.0)
MCH: 28.4 pg (ref 26.0–34.0)
MCHC: 31.5 g/dL (ref 30.0–36.0)
MCV: 90.1 fL (ref 80.0–100.0)
Monocytes Absolute: 0.2 10*3/uL (ref 0.1–1.0)
Monocytes Relative: 3 %
Neutro Abs: 4.2 10*3/uL (ref 1.7–7.7)
Neutrophils Relative %: 69 %
Platelets: 117 10*3/uL — ABNORMAL LOW (ref 150–400)
RBC: 3.03 MIL/uL — ABNORMAL LOW (ref 3.87–5.11)
RDW: 16.9 % — ABNORMAL HIGH (ref 11.5–15.5)
WBC: 6.1 10*3/uL (ref 4.0–10.5)
nRBC: 0 % (ref 0.0–0.2)

## 2021-08-02 LAB — MAGNESIUM: Magnesium: 2 mg/dL (ref 1.7–2.4)

## 2021-08-02 MED ORDER — HEPARIN SOD (PORK) LOCK FLUSH 100 UNIT/ML IV SOLN
500.0000 [IU] | Freq: Once | INTRAVENOUS | Status: AC
  Administered 2021-08-02: 500 [IU] via INTRAVENOUS
  Filled 2021-08-02: qty 5

## 2021-08-02 MED ORDER — LIDOCAINE-PRILOCAINE 2.5-2.5 % EX CREA: TOPICAL_CREAM | CUTANEOUS | 3 refills | Status: AC

## 2021-08-02 NOTE — Progress Notes (Signed)
DISCONTINUE ON PATHWAY REGIMEN - Non-Small Cell Lung     A cycle is every 21 days:     Pemetrexed      Cisplatin   **Always confirm dose/schedule in your pharmacy ordering system**  REASON: Disease Progression PRIOR TREATMENT: LOS258: Cisplatin + Pemetrexed q3 Weeks x 4 Cycles TREATMENT RESPONSE: Progressive Disease (PD)  START ON PATHWAY REGIMEN - Non-Small Cell Lung     A cycle is every 21 days:     Atezolizumab   **Always confirm dose/schedule in your pharmacy ordering system**  Patient Characteristics: Stage IV Metastatic, Nonsquamous, Molecular Analysis Completed, Molecular Alteration Present and Targeted Therapy Exhausted OR EGFR Exon 20+ or KRAS G12C+ or HER2+ Present and No Prior Chemo/Immunotherapy OR No Alteration Present, Second Line -  Chemotherapy/Immunotherapy, PS = 0, 1, No Prior PD-1/PD-L1  Inhibitor and Immunotherapy Candidate Therapeutic Status: Stage IV Metastatic Histology: Nonsquamous Cell Broad Molecular Profiling Status: Engineer, manufacturing Analysis Results: EGFR Exon 20 Insertion Present and No Prior Platinum-based Chemotherapy ECOG Performance Status: 1 Chemotherapy/Immunotherapy Line of Therapy: Second Line Chemotherapy/Immunotherapy Immunotherapy Candidate Status: Candidate for Immunotherapy Prior Immunotherapy Status: No Prior PD-1/PD-L1 Inhibitor Intent of Therapy: Non-Curative / Palliative Intent, Discussed with Patient

## 2021-08-02 NOTE — Progress Notes (Signed)
Pharmacist Chemotherapy Monitoring - Initial Assessment    Anticipated start date: 08/09/21   The following has been reviewed per standard work regarding the patient's treatment regimen: The patient's diagnosis, treatment plan and drug doses, and organ/hematologic function Lab orders and baseline tests specific to treatment regimen  The treatment plan start date, drug sequencing, and pre-medications Prior authorization status  Patient's documented medication list, including drug-drug interaction screen and prescriptions for anti-emetics and supportive care specific to the treatment regimen The drug concentrations, fluid compatibility, administration routes, and timing of the medications to be used The patient's access for treatment and lifetime cumulative dose history, if applicable  The patient's medication allergies and previous infusion related reactions, if applicable   Changes made to treatment plan:  treatment plan date  Follow up needed:  Pending authorization for treatment  and signing treatment plan   Adelina Mings, [place], 08/02/2021  11:22 AM

## 2021-08-08 NOTE — Progress Notes (Signed)
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   INTERVAL HISTORY: Patient returns to clinic today for further evaluation and consideration of cycle 1 of Tecentriq.  She currently feels well and is asymptomatic. She has no neurologic complaints.  She denies any recent fevers or illnesses.  She has a good appetite and denies weight loss.  She has no chest pain, shortness of breath, cough, or hemoptysis.  She denies any nausea, vomiting, constipation, or diarrhea.  She has no urinary complaints.  Patient offers no specific complaints today.  REVIEW OF SYSTEMS:   Review of Systems  Constitutional: Negative.  Negative for fever, malaise/fatigue and weight loss.  Respiratory: Negative.  Negative for cough, hemoptysis and shortness of breath.   Cardiovascular: Negative.  Negative for chest pain and leg swelling.  Gastrointestinal: Negative.  Negative for abdominal pain.  Genitourinary: Negative.  Negative for dysuria and flank pain.  Musculoskeletal: Negative.  Negative for back pain and joint pain.  Skin: Negative.  Negative for rash.  Neurological: Negative.  Negative for dizziness, focal weakness, weakness and headaches.  Psychiatric/Behavioral: Negative.  The patient is not nervous/anxious.    As per HPI. Otherwise, a complete review of systems is negative.  PAST MEDICAL HISTORY: Past Medical History:  Diagnosis Date   Asthma    COPD (chronic obstructive pulmonary disease) ([place])    CVA (cerebral vascular accident) ([place]) 2014   Depression    Diabetes mellitus ([place])    Diabetes mellitus without  complication ([place])    Hepatitis C    History of kidney stones    Hyperlipidemia    Iron deficiency anemia    Obesity    OSA on CPAP    Mild, noncompliant with CPAP said cpap machine was recalled, is waiting for a replacement    Paranoia ([place])    Schizoaffective disorder, bipolar type ([place])     PAST SURGICAL HISTORY: Past Surgical History:  Procedure Laterality Date   BREAST CYST EXCISION Bilateral 2013   Benign   CESAREAN SECTION     CHOLECYSTECTOMY     COLONOSCOPY     ESOPHAGOGASTRODUODENOSCOPY  2020   done in [place] Left 2002   INTERCOSTAL NERVE BLOCK N/A 07/12/2020   Procedure: INTERCOSTAL NERVE BLOCK;  Surgeon: Melrose Nakayama, MD;  Location: [facility];  Service: Thoracic;  Laterality: N/A;   IR IMAGING GUIDED PORT INSERTION  08/20/2020   LYMPH NODE DISSECTION Left 07/12/2020   Procedure: LYMPH NODE DISSECTION;  Surgeon: Melrose Nakayama, MD;  Location: [facility];  Service: Thoracic;  Laterality: Left;   UMBILICAL HERNIA REPAIR     VIDEO BRONCHOSCOPY WITH ENDOBRONCHIAL NAVIGATION N/A 06/07/2020   Procedure: VIDEO BRONCHOSCOPY WITH ENDOBRONCHIAL NAVIGATION;  Surgeon: Tyler Pita, MD;  Location: [facility];  Service: Pulmonary;  Laterality: N/A;    FAMILY HISTORY: History reviewed. No pertinent family history.  ADVANCED DIRECTIVES (Y/N):  N  HEALTH MAINTENANCE: Social History   Tobacco Use   Smoking status: Former    Packs/day: 2.00    Years: 38.00    Pack years: 76.00    Types: Cigarettes    Quit date: 2012    Years  since quitting: 11.1   Smokeless tobacco: Former  Scientific laboratory technician Use: Never used  Substance Use Topics   Alcohol use: Yes    Comment: occassionaly   Drug use: Not Currently     Colonoscopy:  PAP:  Bone density:  Lipid panel:  No Known Allergies  Current Outpatient Medications  Medication Sig Dispense Refill   acetaminophen (TYLENOL) 650 MG CR tablet Take 650 mg by mouth 2 (two) times daily.     albuterol  (VENTOLIN HFA) 108 (90 Base) MCG/ACT inhaler Inhale 2 puffs into the lungs every 6 (six) hours as needed for wheezing or shortness of breath.     ASPERCREME ORIGINAL 10 % cream Apply topically.     ASPIRIN LOW DOSE 81 MG EC tablet Take 81 mg by mouth daily.     atorvastatin (LIPITOR) 40 MG tablet Take 40 mg by mouth at bedtime.     benztropine (COGENTIN) 1 MG tablet Take 1 mg by mouth at bedtime.     citalopram (CELEXA) 20 MG tablet Take 20 mg by mouth every morning.     clobetasol cream (TEMOVATE) 0.05 % Apply to chest area twice daily as needed for itching/rash 30 g 1   Dulaglutide (TRULICITY) 1.5 DG/3.8VF SOPN Inject 1.5 mg into the skin every Friday.     Fluticasone-Umeclidin-Vilant (TRELEGY ELLIPTA) 100-62.5-25 MCG/INH AEPB Inhale 1 puff into the lungs daily.     gabapentin (NEURONTIN) 300 MG capsule Take 300 mg by mouth 2 (two) times daily.     insulin detemir (LEVEMIR) 100 UNIT/ML injection Inject 30 Units into the skin daily.     latanoprost (XALATAN) 0.005 % ophthalmic solution Place 1 drop into both eyes daily.     lidocaine (LIDODERM) 5 % Place 2 patches onto the skin See admin instructions. Apply 1 patch to each foot every 12 hours     lidocaine-prilocaine (EMLA) cream Apply to affected area once 30 g 3   Loperamide HCl (IMODIUM PO) SMARTSIG:1 Capsule(s) By Mouth 1-2 Times Daily PRN     LORazepam (ATIVAN) 1 MG tablet Take 1 mg by mouth 2 (two) times daily.     LYRICA 100 MG capsule Take 100 mg by mouth 2 (two) times daily.     osimertinib mesylate (TAGRISSO) 40 MG tablet Take 1 tablet (40 mg total) by mouth daily. Do not start until given okay by oncologist. 30 tablet 2   prochlorperazine (COMPAZINE) 10 MG tablet Take 1 tablet (10 mg total) by mouth every 6 (six) hours as needed (Nausea or vomiting). 60 tablet 1   SENNA PLUS 8.6-50 MG tablet Take 1 tablet by mouth at bedtime.     traMADol (ULTRAM) 50 MG tablet Take 50 mg by mouth every 6 (six) hours as needed.     ziprasidone  (GEODON) 80 MG capsule Take 80 mg by mouth at bedtime.     No current facility-administered medications for this visit.   Facility-Administered Medications Ordered in Other Visits  Medication Dose Route Frequency Provider Last Rate Last Admin   atezolizumab (TECENTRIQ) 1,200 mg in sodium chloride 0.9 % 250 mL chemo infusion  1,200 mg Intravenous Once Lloyd Huger, MD 270 mL/hr at 08/09/21 1145 1,200 mg at 08/09/21 1145   heparin lock flush 100 unit/mL  500 Units Intracatheter Once PRN Lloyd Huger, MD       sodium chloride flush (NS) 0.9 % injection 10 mL  10 mL Intracatheter PRN Lloyd Huger, MD  OBJECTIVE: Vitals:   08/09/21 0900  BP: 121/72  Pulse: (!) 102  Resp: 16  Temp: 98 F (36.7 C)     Body mass index is 40.24 kg/m.    ECOG FS:0 - Asymptomatic  General: Well-developed, well-nourished, no acute distress.  Sitting in a wheelchair. Eyes: Pink conjunctiva, anicteric sclera. HEENT: Normocephalic, moist mucous membranes. Lungs: No audible wheezing or coughing. Heart: Regular rate and rhythm. Abdomen: Soft, nontender, no obvious distention. Musculoskeletal: No edema, cyanosis, or clubbing. Neuro: Alert, answering all questions appropriately. Cranial nerves grossly intact. Skin: No rashes or petechiae noted. Psych: Normal affect.   LAB RESULTS:  Lab Results  Component Value Date   NA 133 (L) 08/09/2021   K 4.0 08/09/2021   CL 98 08/09/2021   CO2 23 08/09/2021   GLUCOSE 115 (H) 08/09/2021   BUN 13 08/09/2021   CREATININE 1.22 (H) 08/09/2021   CALCIUM 9.2 08/09/2021   PROT 7.4 08/09/2021   ALBUMIN 4.1 08/09/2021   AST 46 (H) 08/09/2021   ALT 33 08/09/2021   ALKPHOS 156 (H) 08/09/2021   BILITOT 0.7 08/09/2021   GFRNONAA 50 (L) 08/09/2021   GFRAA >60 01/30/2020    Lab Results  Component Value Date   WBC 5.9 08/09/2021   NEUTROABS 4.2 08/09/2021   HGB 9.0 (L) 08/09/2021   HCT 28.0 (L) 08/09/2021   MCV 88.9 08/09/2021   PLT 142 (L)  08/09/2021     STUDIES: NM PET Image Restage (PS) Skull Base to Thigh (F-18 FDG)  Result Date: 07/28/2021 CLINICAL DATA:  Subsequent treatment strategy for adenocarcinoma of the left lower lobe. EXAM: NUCLEAR MEDICINE PET SKULL BASE TO THIGH TECHNIQUE: 12.8 mCi F-18 FDG was injected intravenously. Full-ring PET imaging was performed from the skull base to thigh after the radiotracer. CT data was obtained and used for attenuation correction and anatomic localization. Fasting blood glucose: 104 mg/dl COMPARISON:  01/26/2021 FINDINGS: Mediastinal blood pool activity: SUV max 2.6 Liver activity: SUV max NA NECK: No hypermetabolic lymph nodes in the neck. Incidental CT findings: none CHEST: Hypermetabolic pre-vascular lymphadenopathy identified with SUV max = 5.3. New hypermetabolic right internal mammary node (81/3) with SUV max = 5.0. New tiny hypermetabolic lymph node identified left axilla with SUV max = 3.0 New focal hypermetabolism identified left infrahilar region with SUV max = 6.5 corresponding to a amorphous soft tissue in the region of the surgical bed. Hypermetabolism along the left pleura right dental fide with hypermetabolic left posterior pleural nodule measuring 1.4 cm (118/3) demonstrating SUV max = 5.4. Incidental CT findings: Heart size upper normal. Trace pericardial effusion. Right Port-A-Cath tip is in the low right atrium. Coronary artery calcification is evident. Mild atherosclerotic calcification is noted in the wall of the thoracic aorta. Stable volume loss left hemithorax consistent with prior surgery. Small loculated pleural effusion noted inferior left hemithorax similar to prior. ABDOMEN/PELVIS: Interval progression of pre-existing liver metastases with new metastatic disease identified in the liver on today's study. New 4.0 cm mass in the anterior hepatic dome demonstrates SUV max = 11.9. New 4.7 cm mass posterior right hepatic dome is new in the interval with SUV max = 9.4. Previous  mass in posterior segment II measures 5.1 cm, increased from 1.9 cm previously with SUV max = 12.6 today versus 11.8 on prior. New inferior right hepatic lobe lesion demonstrates SUV max = 9.4. No evidence for hypermetabolic lymphadenopathy in the abdomen. 2.4 cm hypermetabolic metastatic lesion identified inferior left trapezius muscle on 01/20 5/3 with SUV max =  7.1. Incidental CT findings: Sequelae of prior ventral hernia repair with persistent fascial laxity and diastasis of the rectus sheath. Ovarian dermoid in the right anterior pelvis is similar to prior. Small gas locule anterior subcutaneous fat of the right abdominal wall is compatible with an injection site. There is moderate atherosclerotic calcification of the abdominal aorta without aneurysm. Small fluid collection subcutaneous fat lateral left abdominal wall is stable. SKELETON: Numerous new bone metastases are identified 17 mm index lesion anterior left iliac bone on 204/3 with SUV max = 10.9. Intensely hypermetabolic sacral lesions are new in the interval with progression of the posterior right iliac bone hypermetabolic metastasis. Multiple new thoracolumbar bony metastases with diffuse involvement of the T12 vertebral body ( SUV max = 8.0 progressive from small right-sided T12 lesion on the prior study. Inferior left scapular lesion with demonstrated SUV max = 2.9 previously is 6.4 today. Left humeral head lesion with demonstrated SUV max = 7.2 previously is 8.5 today. New hypermetabolic lesion right humeral head with SUV max = 5.9. Sternal lesion measured previously at SUV max = 4.3 demonstrates SUV max = 8.5 today. Incidental CT findings: none IMPRESSION: 1. Overall generalized progression of disease with new hypermetabolic lymph nodes in the chest, progressive in new liver metastases, and progressive in new bone metastases. 2. Similar appearance of the loculated left pleural effusion with hypermetabolic nodular pleural disease in the left  hemithorax. 3. New hypermetabolic muscular metastatic lesion in the inferior left trapezius muscle. Similar appearance hypermetabolic lesion in the right flank identified previously. 4.  Aortic Atherosclerois (ICD10-170.0) Electronically Signed   By: Misty Stanley M.D.   On: 07/28/2021 09:30    ONCOLOGY HISTORY: PET scan results from May 06, 2020 reviewed independently with hypermetabolic left lower lobe lung mass and biopsy confirmed malignancy.  Patient also noted to have a right iliac bony hypermetabolism as well as a soft tissue mass adjacent suspicious for isolated metastasis.  Soft tissue mass was biopsied on May 19, 2020 which was negative for malignancy.  Patient underwent resection on July 12, 2020 which revealed 4 positive lymph nodes.  MRI of the brain on August 25, 2020 was negative for metastatic disease.  She completed adjuvant cisplatin and pemetrexed on Oct 28, 2020.  Repeat PET scan results from January 27, 2021 reviewed independently with progressive disease with new metastatic adenopathy in the chest, liver metastasis, and multifocal bone metastasis.  Given her EGFR mutation was initiated on Tagrisso in September 2022.  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  PLAN:    1.  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  Patient wishes to continue systemic treatment, therefore will discontinue Tagrisso and given that she has a positive PD-L1, will proceed with Tecentriq every 3 weeks until progression of disease.  Proceed with cycle 1 of treatment today.  Return to clinic in 3 weeks for further evaluation and consideration of cycle 2.    2.  Anemia:  Hemoglobin mildly improved to 9.0.   3.  Hyperglycemia: Improved.  Continue monitoring and treatment per primary care.   4.  Neuropathy: Chronic and unchanged.  Continue Lyrica as prescribed. 5.  Thrombocytopenia: Mild.  Patient's platelet count has improved  to 142. Discontinue Tagrisso as above. 6.  Bony lesions: Continue with Zometa on the odd-numbered cycles of Tecentriq.   7.  Renal insufficiency: Chronic and unchanged.  Patient's creatinine is 1.22 today.  Patient expressed understanding and was in agreement with this plan. She also understands that She can call clinic at any time with any questions, concerns, or complaints.    Cancer Staging  Primary adenocarcinoma of lower lobe of left lung [facility]) Staging form: Lung, AJCC 8th Edition - Pathologic stage from 07/27/2020: Stage IVB (pT2b, pN1, cM1c) - Signed by Lloyd Huger, MD on 02/02/2021   Lloyd Huger, MD   08/09/2021 12:39 PM

## 2021-08-09 ENCOUNTER — Encounter: Payer: Self-pay | Admitting: Oncology

## 2021-08-09 ENCOUNTER — Inpatient Hospital Stay: Payer: Medicaid Other

## 2021-08-09 ENCOUNTER — Other Ambulatory Visit: Payer: Self-pay

## 2021-08-09 ENCOUNTER — Inpatient Hospital Stay (HOSPITAL_BASED_OUTPATIENT_CLINIC_OR_DEPARTMENT_OTHER): Payer: Medicaid Other | Admitting: Oncology

## 2021-08-09 VITALS — BP 105/51 | HR 85

## 2021-08-09 VITALS — BP 121/72 | HR 102 | Temp 98.0°F | Resp 16 | Wt 220.0 lb

## 2021-08-09 DIAGNOSIS — C3432 Malignant neoplasm of lower lobe, left bronchus or lung: Secondary | ICD-10-CM | POA: Diagnosis not present

## 2021-08-09 DIAGNOSIS — Z5112 Encounter for antineoplastic immunotherapy: Secondary | ICD-10-CM | POA: Diagnosis not present

## 2021-08-09 LAB — CBC WITH DIFFERENTIAL/PLATELET
Abs Immature Granulocytes: 0.03 10*3/uL (ref 0.00–0.07)
Basophils Absolute: 0 10*3/uL (ref 0.0–0.1)
Basophils Relative: 0 %
Eosinophils Absolute: 0 10*3/uL (ref 0.0–0.5)
Eosinophils Relative: 1 %
HCT: 28 % — ABNORMAL LOW (ref 36.0–46.0)
Hemoglobin: 9 g/dL — ABNORMAL LOW (ref 12.0–15.0)
Immature Granulocytes: 1 %
Lymphocytes Relative: 24 %
Lymphs Abs: 1.4 10*3/uL (ref 0.7–4.0)
MCH: 28.6 pg (ref 26.0–34.0)
MCHC: 32.1 g/dL (ref 30.0–36.0)
MCV: 88.9 fL (ref 80.0–100.0)
Monocytes Absolute: 0.2 10*3/uL (ref 0.1–1.0)
Monocytes Relative: 4 %
Neutro Abs: 4.2 10*3/uL (ref 1.7–7.7)
Neutrophils Relative %: 70 %
Platelets: 142 10*3/uL — ABNORMAL LOW (ref 150–400)
RBC: 3.15 MIL/uL — ABNORMAL LOW (ref 3.87–5.11)
RDW: 16.6 % — ABNORMAL HIGH (ref 11.5–15.5)
WBC: 5.9 10*3/uL (ref 4.0–10.5)
nRBC: 0 % (ref 0.0–0.2)

## 2021-08-09 LAB — COMPREHENSIVE METABOLIC PANEL
ALT: 33 U/L (ref 0–44)
AST: 46 U/L — ABNORMAL HIGH (ref 15–41)
Albumin: 4.1 g/dL (ref 3.5–5.0)
Alkaline Phosphatase: 156 U/L — ABNORMAL HIGH (ref 38–126)
Anion gap: 12 (ref 5–15)
BUN: 13 mg/dL (ref 8–23)
CO2: 23 mmol/L (ref 22–32)
Calcium: 9.2 mg/dL (ref 8.9–10.3)
Chloride: 98 mmol/L (ref 98–111)
Creatinine, Ser: 1.22 mg/dL — ABNORMAL HIGH (ref 0.44–1.00)
GFR, Estimated: 50 mL/min — ABNORMAL LOW (ref >60)
Glucose, Bld: 115 mg/dL — ABNORMAL HIGH (ref 70–99)
Potassium: 4 mmol/L (ref 3.5–5.1)
Sodium: 133 mmol/L — ABNORMAL LOW (ref 135–145)
Total Bilirubin: 0.7 mg/dL (ref 0.3–1.2)
Total Protein: 7.4 g/dL (ref 6.5–8.1)

## 2021-08-09 LAB — MAGNESIUM: Magnesium: 2 mg/dL (ref 1.7–2.4)

## 2021-08-09 LAB — TSH: TSH: 2.126 u[IU]/mL (ref 0.350–4.500)

## 2021-08-09 MED ORDER — SODIUM CHLORIDE 0.9% FLUSH
10.0000 mL | INTRAVENOUS | Status: DC | PRN
  Filled 2021-08-09: qty 10

## 2021-08-09 MED ORDER — ZOLEDRONIC ACID 4 MG/100ML IV SOLN
4.0000 mg | Freq: Once | INTRAVENOUS | Status: AC
  Administered 2021-08-09: 4 mg via INTRAVENOUS
  Filled 2021-08-09: qty 100

## 2021-08-09 MED ORDER — SODIUM CHLORIDE 0.9 % IV SOLN
Freq: Once | INTRAVENOUS | Status: AC
  Filled 2021-08-09: qty 250

## 2021-08-09 MED ORDER — HEPARIN SOD (PORK) LOCK FLUSH 100 UNIT/ML IV SOLN
500.0000 [IU] | Freq: Once | INTRAVENOUS | Status: AC | PRN
  Administered 2021-08-09: 500 [IU]
  Filled 2021-08-09: qty 5

## 2021-08-09 MED ORDER — SODIUM CHLORIDE 0.9 % IV SOLN
1200.0000 mg | Freq: Once | INTRAVENOUS | Status: AC
  Administered 2021-08-09: 1200 mg via INTRAVENOUS
  Filled 2021-08-09: qty 20

## 2021-08-09 NOTE — Patient Instructions (Signed)
D/c [facility]  Discharge Instructions: Thank you for choosing [facility] to provide your oncology and hematology care.   If you have a lab appointment with the Cancer Center, please go directly to the Cancer Center and check in at the registration area.   Wear comfortable clothing and clothing appropriate for easy access to any Portacath or PICC line.   We strive to give you quality time with your provider. You may need to reschedule your appointment if you arrive late (15 or more minutes).  Arriving late affects you and other patients whose appointments are after yours.  Also, if you miss three or more appointments without notifying the office, you may be dismissed from the clinic at the provider's discretion.      For prescription refill requests, have your pharmacy contact our office and allow 72 hours for refills to be completed.    Today you received the following chemotherapy and/or immunotherapy agents       To help prevent nausea and vomiting after your treatment, we encourage you to take your nausea medication as directed.  BELOW ARE SYMPTOMS THAT SHOULD BE REPORTED IMMEDIATELY: *FEVER GREATER THAN 100.4 F (38 C) OR HIGHER *CHILLS OR SWEATING *NAUSEA AND VOMITING THAT IS NOT CONTROLLED WITH YOUR NAUSEA MEDICATION *UNUSUAL SHORTNESS OF BREATH *UNUSUAL BRUISING OR BLEEDING *URINARY PROBLEMS (pain or burning when urinating, or frequent urination) *BOWEL PROBLEMS (unusual diarrhea, constipation, pain near the anus) TENDERNESS IN MOUTH AND THROAT WITH OR WITHOUT PRESENCE OF ULCERS (sore throat, sores in mouth, or a toothache) UNUSUAL RASH, SWELLING OR PAIN  UNUSUAL VAGINAL DISCHARGE OR ITCHING   Items with * indicate a potential emergency and should be followed up as soon as possible or go to the Emergency Department if any problems should occur.  Please show the CHEMOTHERAPY ALERT CARD or IMMUNOTHERAPY ALERT CARD at check-in to  the Emergency Department and triage nurse.  Should you have questions after your visit or need to cancel or reschedule your appointment, please contact [facility]  Dept: [phone]  and follow the prompts.  Office hours are 8:00 a.m. to 4:30 p.m. Monday - Friday. Please note that voicemails left after 4:00 p.m. may not be returned until the following business day.  We are closed weekends and major holidays. You have access to a nurse at all times for urgent questions. Please call the main number to the clinic Dept: [phone] and follow the prompts.   For any non-urgent questions, you may also contact your provider using MyChart. We now offer e-Visits for anyone 18 and older to request care online for non-urgent symptoms. For details visit [email or web address].   Also download the MyChart app! Go to the app store, search "MyChart", open the app, select [facility], and log in with your MyChart username and password.  Due to Covid, a mask is required upon entering the hospital/clinic. If you do not have a mask, one will be given to you upon arrival. For doctor visits, patients may have 1 support person aged 18 or older with them. For treatment visits, patients cannot have anyone with them due to current Covid guidelines and our immunocompromised population.   

## 2021-08-09 NOTE — Progress Notes (Signed)
Initial HR 102. Per Dr. Grayland Ormond, okay to proceed with treatment.

## 2021-08-10 ENCOUNTER — Other Ambulatory Visit: Payer: Self-pay | Admitting: Family Medicine

## 2021-08-10 DIAGNOSIS — C3490 Malignant neoplasm of unspecified part of unspecified bronchus or lung: Secondary | ICD-10-CM

## 2021-08-10 LAB — T4: T4, Total: 6 ug/dL (ref 4.5–12.0)

## 2021-08-23 ENCOUNTER — Other Ambulatory Visit: Payer: Self-pay | Admitting: Family Medicine

## 2021-08-23 DIAGNOSIS — R410 Disorientation, unspecified: Secondary | ICD-10-CM

## 2021-08-23 DIAGNOSIS — C50919 Malignant neoplasm of unspecified site of unspecified female breast: Secondary | ICD-10-CM

## 2021-08-26 NOTE — Progress Notes (Unsigned)
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   INTERVAL HISTORY: Patient returns to clinic today for further evaluation and consideration of cycle 1 of Tecentriq.  She currently feels well and is asymptomatic. She has no neurologic complaints.  She denies any recent fevers or illnesses.  She has a good appetite and denies weight loss.  She has no chest pain, shortness of breath, cough, or hemoptysis.  She denies any nausea, vomiting, constipation, or diarrhea.  She has no urinary complaints.  Patient offers no specific complaints today.  REVIEW OF SYSTEMS:   Review of Systems  Constitutional: Negative.  Negative for fever, malaise/fatigue and weight loss.  Respiratory: Negative.  Negative for cough, hemoptysis and shortness of breath.   Cardiovascular: Negative.  Negative for chest pain and leg swelling.  Gastrointestinal: Negative.  Negative for abdominal pain.  Genitourinary: Negative.  Negative for dysuria and flank pain.  Musculoskeletal: Negative.  Negative for back pain and joint pain.  Skin: Negative.  Negative for rash.  Neurological: Negative.  Negative for dizziness, focal weakness, weakness and headaches.  Psychiatric/Behavioral: Negative.  The patient is not nervous/anxious.    As per HPI. Otherwise, a complete review of systems is negative.  PAST MEDICAL HISTORY: Past Medical History:  Diagnosis Date   Asthma    COPD (chronic obstructive pulmonary disease) ([place])    CVA (cerebral vascular accident) ([place]) 2014   Depression    Diabetes mellitus ([place])    Diabetes mellitus without  complication ([place])    Hepatitis C    History of kidney stones    Hyperlipidemia    Iron deficiency anemia    Obesity    OSA on CPAP    Mild, noncompliant with CPAP said cpap machine was recalled, is waiting for a replacement    Paranoia ([place])    Schizoaffective disorder, bipolar type ([place])     PAST SURGICAL HISTORY: Past Surgical History:  Procedure Laterality Date   BREAST CYST EXCISION Bilateral 2013   Benign   CESAREAN SECTION     CHOLECYSTECTOMY     COLONOSCOPY     ESOPHAGOGASTRODUODENOSCOPY  2020   done in [place] Left 2002   INTERCOSTAL NERVE BLOCK N/A 07/12/2020   Procedure: INTERCOSTAL NERVE BLOCK;  Surgeon: Melrose Nakayama, MD;  Location: [facility];  Service: Thoracic;  Laterality: N/A;   IR IMAGING GUIDED PORT INSERTION  08/20/2020   LYMPH NODE DISSECTION Left 07/12/2020   Procedure: LYMPH NODE DISSECTION;  Surgeon: Melrose Nakayama, MD;  Location: [facility];  Service: Thoracic;  Laterality: Left;   UMBILICAL HERNIA REPAIR     VIDEO BRONCHOSCOPY WITH ENDOBRONCHIAL NAVIGATION N/A 06/07/2020   Procedure: VIDEO BRONCHOSCOPY WITH ENDOBRONCHIAL NAVIGATION;  Surgeon: Tyler Pita, MD;  Location: [facility];  Service: Pulmonary;  Laterality: N/A;    FAMILY HISTORY: No family history on file.  ADVANCED DIRECTIVES (Y/N):  N  HEALTH MAINTENANCE: Social History   Tobacco Use   Smoking status: Former    Packs/day: 2.00    Years: 38.00    Pack years: 76.00    Types: Cigarettes    Quit date: 2012    Years since  quitting: 11.1   Smokeless tobacco: Former  Scientific laboratory technician Use: Never used  Substance Use Topics   Alcohol use: Yes    Comment: occassionaly   Drug use: Not Currently     Colonoscopy:  PAP:  Bone density:  Lipid panel:  No Known Allergies  Current Outpatient Medications  Medication Sig Dispense Refill   acetaminophen (TYLENOL) 650 MG CR tablet Take 650 mg by mouth 2 (two) times daily.     albuterol (VENTOLIN HFA) 108  (90 Base) MCG/ACT inhaler Inhale 2 puffs into the lungs every 6 (six) hours as needed for wheezing or shortness of breath.     ASPERCREME ORIGINAL 10 % cream Apply topically.     ASPIRIN LOW DOSE 81 MG EC tablet Take 81 mg by mouth daily.     atorvastatin (LIPITOR) 40 MG tablet Take 40 mg by mouth at bedtime.     benztropine (COGENTIN) 1 MG tablet Take 1 mg by mouth at bedtime.     citalopram (CELEXA) 20 MG tablet Take 20 mg by mouth every morning.     clobetasol cream (TEMOVATE) 0.05 % Apply to chest area twice daily as needed for itching/rash 30 g 1   Dulaglutide (TRULICITY) 1.5 HE/5.2DP SOPN Inject 1.5 mg into the skin every Friday.     Fluticasone-Umeclidin-Vilant (TRELEGY ELLIPTA) 100-62.5-25 MCG/INH AEPB Inhale 1 puff into the lungs daily.     gabapentin (NEURONTIN) 300 MG capsule Take 300 mg by mouth 2 (two) times daily.     insulin detemir (LEVEMIR) 100 UNIT/ML injection Inject 30 Units into the skin daily.     latanoprost (XALATAN) 0.005 % ophthalmic solution Place 1 drop into both eyes daily.     lidocaine (LIDODERM) 5 % Place 2 patches onto the skin See admin instructions. Apply 1 patch to each foot every 12 hours     lidocaine-prilocaine (EMLA) cream Apply to affected area once 30 g 3   Loperamide HCl (IMODIUM PO) SMARTSIG:1 Capsule(s) By Mouth 1-2 Times Daily PRN     LORazepam (ATIVAN) 1 MG tablet Take 1 mg by mouth 2 (two) times daily.     LYRICA 100 MG capsule Take 100 mg by mouth 2 (two) times daily.     osimertinib mesylate (TAGRISSO) 40 MG tablet Take 1 tablet (40 mg total) by mouth daily. Do not start until given okay by oncologist. 30 tablet 2   prochlorperazine (COMPAZINE) 10 MG tablet Take 1 tablet (10 mg total) by mouth every 6 (six) hours as needed (Nausea or vomiting). 60 tablet 1   SENNA PLUS 8.6-50 MG tablet Take 1 tablet by mouth at bedtime.     traMADol (ULTRAM) 50 MG tablet Take 50 mg by mouth every 6 (six) hours as needed.     ziprasidone (GEODON) 80 MG capsule  Take 80 mg by mouth at bedtime.     No current facility-administered medications for this visit.    OBJECTIVE: There were no vitals filed for this visit.    There is no height or weight on file to calculate BMI.    ECOG FS:0 - Asymptomatic  General: Well-developed, well-nourished, no acute distress.  Sitting in a wheelchair. Eyes: Pink conjunctiva, anicteric sclera. HEENT: Normocephalic, moist mucous membranes. Lungs: No audible wheezing or coughing. Heart: Regular rate and rhythm. Abdomen: Soft, nontender, no obvious distention. Musculoskeletal: No edema, cyanosis, or clubbing. Neuro: Alert, answering all questions appropriately. Cranial nerves grossly intact. Skin: No rashes or petechiae noted. Psych: Normal affect.  LAB RESULTS:  Lab Results  Component Value Date   NA 133 (L) 08/09/2021   K 4.0 08/09/2021   CL 98 08/09/2021   CO2 23 08/09/2021   GLUCOSE 115 (H) 08/09/2021   BUN 13 08/09/2021   CREATININE 1.22 (H) 08/09/2021   CALCIUM 9.2 08/09/2021   PROT 7.4 08/09/2021   ALBUMIN 4.1 08/09/2021   AST 46 (H) 08/09/2021   ALT 33 08/09/2021   ALKPHOS 156 (H) 08/09/2021   BILITOT 0.7 08/09/2021   GFRNONAA 50 (L) 08/09/2021   GFRAA >60 01/30/2020    Lab Results  Component Value Date   WBC 5.9 08/09/2021   NEUTROABS 4.2 08/09/2021   HGB 9.0 (L) 08/09/2021   HCT 28.0 (L) 08/09/2021   MCV 88.9 08/09/2021   PLT 142 (L) 08/09/2021     STUDIES: NM PET Image Restage (PS) Skull Base to Thigh (F-18 FDG)  Result Date: 07/28/2021 CLINICAL DATA:  Subsequent treatment strategy for adenocarcinoma of the left lower lobe. EXAM: NUCLEAR MEDICINE PET SKULL BASE TO THIGH TECHNIQUE: 12.8 mCi F-18 FDG was injected intravenously. Full-ring PET imaging was performed from the skull base to thigh after the radiotracer. CT data was obtained and used for attenuation correction and anatomic localization. Fasting blood glucose: 104 mg/dl COMPARISON:  01/26/2021 FINDINGS: Mediastinal blood  pool activity: SUV max 2.6 Liver activity: SUV max NA NECK: No hypermetabolic lymph nodes in the neck. Incidental CT findings: none CHEST: Hypermetabolic pre-vascular lymphadenopathy identified with SUV max = 5.3. New hypermetabolic right internal mammary node (81/3) with SUV max = 5.0. New tiny hypermetabolic lymph node identified left axilla with SUV max = 3.0 New focal hypermetabolism identified left infrahilar region with SUV max = 6.5 corresponding to a amorphous soft tissue in the region of the surgical bed. Hypermetabolism along the left pleura right dental fide with hypermetabolic left posterior pleural nodule measuring 1.4 cm (118/3) demonstrating SUV max = 5.4. Incidental CT findings: Heart size upper normal. Trace pericardial effusion. Right Port-A-Cath tip is in the low right atrium. Coronary artery calcification is evident. Mild atherosclerotic calcification is noted in the wall of the thoracic aorta. Stable volume loss left hemithorax consistent with prior surgery. Small loculated pleural effusion noted inferior left hemithorax similar to prior. ABDOMEN/PELVIS: Interval progression of pre-existing liver metastases with new metastatic disease identified in the liver on today's study. New 4.0 cm mass in the anterior hepatic dome demonstrates SUV max = 11.9. New 4.7 cm mass posterior right hepatic dome is new in the interval with SUV max = 9.4. Previous mass in posterior segment II measures 5.1 cm, increased from 1.9 cm previously with SUV max = 12.6 today versus 11.8 on prior. New inferior right hepatic lobe lesion demonstrates SUV max = 9.4. No evidence for hypermetabolic lymphadenopathy in the abdomen. 2.4 cm hypermetabolic metastatic lesion identified inferior left trapezius muscle on 01/20 5/3 with SUV max = 7.1. Incidental CT findings: Sequelae of prior ventral hernia repair with persistent fascial laxity and diastasis of the rectus sheath. Ovarian dermoid in the right anterior pelvis is similar to  prior. Small gas locule anterior subcutaneous fat of the right abdominal wall is compatible with an injection site. There is moderate atherosclerotic calcification of the abdominal aorta without aneurysm. Small fluid collection subcutaneous fat lateral left abdominal wall is stable. SKELETON: Numerous new bone metastases are identified 17 mm index lesion anterior left iliac bone on 204/3 with SUV max = 10.9. Intensely hypermetabolic sacral lesions are new in the interval with progression of  the posterior right iliac bone hypermetabolic metastasis. Multiple new thoracolumbar bony metastases with diffuse involvement of the T12 vertebral body ( SUV max = 8.0 progressive from small right-sided T12 lesion on the prior study. Inferior left scapular lesion with demonstrated SUV max = 2.9 previously is 6.4 today. Left humeral head lesion with demonstrated SUV max = 7.2 previously is 8.5 today. New hypermetabolic lesion right humeral head with SUV max = 5.9. Sternal lesion measured previously at SUV max = 4.3 demonstrates SUV max = 8.5 today. Incidental CT findings: none IMPRESSION: 1. Overall generalized progression of disease with new hypermetabolic lymph nodes in the chest, progressive in new liver metastases, and progressive in new bone metastases. 2. Similar appearance of the loculated left pleural effusion with hypermetabolic nodular pleural disease in the left hemithorax. 3. New hypermetabolic muscular metastatic lesion in the inferior left trapezius muscle. Similar appearance hypermetabolic lesion in the right flank identified previously. 4.  Aortic Atherosclerois (ICD10-170.0) Electronically Signed   By: Misty Stanley M.D.   On: 07/28/2021 09:30    ONCOLOGY HISTORY: PET scan results from May 06, 2020 reviewed independently with hypermetabolic left lower lobe lung mass and biopsy confirmed malignancy.  Patient also noted to have a right iliac bony hypermetabolism as well as a soft tissue mass adjacent  suspicious for isolated metastasis.  Soft tissue mass was biopsied on May 19, 2020 which was negative for malignancy.  Patient underwent resection on July 12, 2020 which revealed 4 positive lymph nodes.  MRI of the brain on August 25, 2020 was negative for metastatic disease.  She completed adjuvant cisplatin and pemetrexed on Oct 28, 2020.  Repeat PET scan results from January 27, 2021 reviewed independently with progressive disease with new metastatic adenopathy in the chest, liver metastasis, and multifocal bone metastasis.  Given her EGFR mutation was initiated on Tagrisso in September 2022.  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  PLAN:    1.  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  Patient wishes to continue systemic treatment, therefore will discontinue Tagrisso and given that she has a positive PD-L1, will proceed with Tecentriq every 3 weeks until progression of disease.  Proceed with cycle 1 of treatment today.  Return to clinic in 3 weeks for further evaluation and consideration of cycle 2.    2.  Anemia: Hemoglobin mildly improved to 9.0.   3.  Hyperglycemia: Improved.  Continue monitoring and treatment per primary care.   4.  Neuropathy: Chronic and unchanged.  Continue Lyrica as prescribed. 5.  Thrombocytopenia: Mild.  Patient's platelet count has improved to 142. Discontinue Tagrisso as above. 6.  Bony lesions: Continue with Zometa on the odd-numbered cycles of Tecentriq.   7.  Renal insufficiency: Chronic and unchanged.  Patient's creatinine is 1.22 today.  Patient expressed understanding and was in agreement with this plan. She also  understands that She can call clinic at any time with any questions, concerns, or complaints.    Cancer Staging  Primary adenocarcinoma of lower lobe of left lung [facility]) Staging form: Lung, AJCC 8th Edition - Pathologic stage from 07/27/2020: Stage IVB (pT2b, pN1, cM1c) - Signed by Lloyd Huger, MD on 02/02/2021  Lloyd Huger, MD   08/26/2021 8:35 AM

## 2021-08-29 ENCOUNTER — Other Ambulatory Visit: Payer: Self-pay

## 2021-08-29 ENCOUNTER — Ambulatory Visit
Admission: RE | Admit: 2021-08-29 | Discharge: 2021-08-29 | Disposition: A | Discharge status: Home / Self Care | Payer: Medicaid Other | Source: Ambulatory Visit | Attending: Family Medicine | Admitting: Family Medicine

## 2021-08-29 DIAGNOSIS — R410 Disorientation, unspecified: Secondary | ICD-10-CM

## 2021-08-29 DIAGNOSIS — C50919 Malignant neoplasm of unspecified site of unspecified female breast: Secondary | ICD-10-CM

## 2021-08-30 ENCOUNTER — Inpatient Hospital Stay (HOSPITAL_BASED_OUTPATIENT_CLINIC_OR_DEPARTMENT_OTHER): Payer: Medicaid Other | Admitting: Hospice and Palliative Medicine

## 2021-08-30 ENCOUNTER — Inpatient Hospital Stay (HOSPITAL_BASED_OUTPATIENT_CLINIC_OR_DEPARTMENT_OTHER): Payer: Medicaid Other | Admitting: Oncology

## 2021-08-30 ENCOUNTER — Inpatient Hospital Stay: Payer: Medicaid Other | Attending: Oncology

## 2021-08-30 ENCOUNTER — Inpatient Hospital Stay: Payer: Medicaid Other

## 2021-08-30 VITALS — BP 130/84 | HR 103 | Temp 98.2°F | Resp 18 | Ht 62.0 in | Wt 211.4 lb

## 2021-08-30 DIAGNOSIS — C3432 Malignant neoplasm of lower lobe, left bronchus or lung: Secondary | ICD-10-CM

## 2021-08-30 DIAGNOSIS — Z5112 Encounter for antineoplastic immunotherapy: Secondary | ICD-10-CM | POA: Insufficient documentation

## 2021-08-30 DIAGNOSIS — Z515 Encounter for palliative care: Secondary | ICD-10-CM

## 2021-08-30 DIAGNOSIS — Z79899 Other long term (current) drug therapy: Secondary | ICD-10-CM | POA: Insufficient documentation

## 2021-08-30 LAB — COMPREHENSIVE METABOLIC PANEL
ALT: 25 U/L (ref 0–44)
AST: 41 U/L (ref 15–41)
Albumin: 4.1 g/dL (ref 3.5–5.0)
Alkaline Phosphatase: 163 U/L — ABNORMAL HIGH (ref 38–126)
Anion gap: 12 (ref 5–15)
BUN: 11 mg/dL (ref 8–23)
CO2: 20 mmol/L — ABNORMAL LOW (ref 22–32)
Calcium: 9.4 mg/dL (ref 8.9–10.3)
Chloride: 107 mmol/L (ref 98–111)
Creatinine, Ser: 1.46 mg/dL — ABNORMAL HIGH (ref 0.44–1.00)
GFR, Estimated: 40 mL/min — ABNORMAL LOW (ref >60)
Glucose, Bld: 170 mg/dL — ABNORMAL HIGH (ref 70–99)
Potassium: 3.6 mmol/L (ref 3.5–5.1)
Sodium: 139 mmol/L (ref 135–145)
Total Bilirubin: 0.8 mg/dL (ref 0.3–1.2)
Total Protein: 7.7 g/dL (ref 6.5–8.1)

## 2021-08-30 LAB — CBC WITH DIFFERENTIAL/PLATELET
Abs Immature Granulocytes: 0.08 10*3/uL — ABNORMAL HIGH (ref 0.00–0.07)
Basophils Absolute: 0 10*3/uL (ref 0.0–0.1)
Basophils Relative: 0 %
Eosinophils Absolute: 0.1 10*3/uL (ref 0.0–0.5)
Eosinophils Relative: 1 %
HCT: 31.3 % — ABNORMAL LOW (ref 36.0–46.0)
Hemoglobin: 9.9 g/dL — ABNORMAL LOW (ref 12.0–15.0)
Immature Granulocytes: 1 %
Lymphocytes Relative: 20 %
Lymphs Abs: 1.9 10*3/uL (ref 0.7–4.0)
MCH: 28.2 pg (ref 26.0–34.0)
MCHC: 31.6 g/dL (ref 30.0–36.0)
MCV: 89.2 fL (ref 80.0–100.0)
Monocytes Absolute: 0.5 10*3/uL (ref 0.1–1.0)
Monocytes Relative: 5 %
Neutro Abs: 7 10*3/uL (ref 1.7–7.7)
Neutrophils Relative %: 73 %
Platelets: 343 10*3/uL (ref 150–400)
RBC: 3.51 MIL/uL — ABNORMAL LOW (ref 3.87–5.11)
RDW: 17.3 % — ABNORMAL HIGH (ref 11.5–15.5)
WBC: 9.6 10*3/uL (ref 4.0–10.5)
nRBC: 0 % (ref 0.0–0.2)

## 2021-08-30 LAB — TSH: TSH: 1.349 u[IU]/mL (ref 0.350–4.500)

## 2021-08-30 MED ORDER — SODIUM CHLORIDE 0.9 % IV SOLN
1200.0000 mg | Freq: Once | INTRAVENOUS | Status: AC
  Administered 2021-08-30: 1200 mg via INTRAVENOUS
  Filled 2021-08-30: qty 20

## 2021-08-30 MED ORDER — HEPARIN SOD (PORK) LOCK FLUSH 100 UNIT/ML IV SOLN
INTRAVENOUS | Status: AC
  Filled 2021-08-30: qty 5

## 2021-08-30 MED ORDER — SODIUM CHLORIDE 0.9 % IV SOLN
Freq: Once | INTRAVENOUS | Status: AC
  Filled 2021-08-30: qty 250

## 2021-08-30 MED ORDER — SODIUM CHLORIDE 0.9% FLUSH
10.0000 mL | INTRAVENOUS | Status: DC | PRN
  Filled 2021-08-30: qty 10

## 2021-08-30 MED ORDER — HEPARIN SOD (PORK) LOCK FLUSH 100 UNIT/ML IV SOLN
500.0000 [IU] | Freq: Once | INTRAVENOUS | Status: AC | PRN
  Administered 2021-08-30: 500 [IU]
  Filled 2021-08-30: qty 5

## 2021-08-30 NOTE — Progress Notes (Signed)
OK to treat with HR 103 per Dr. Grayland Ormond ?

## 2021-08-30 NOTE — Patient Instructions (Signed)
[facility] [place]  Discharge Instructions: ?Thank you for choosing [facility] to provide your oncology and hematology care.  ?If you have a lab appointment with the [facility], please go directly to the [facility] and check in at the registration area. ? ?Wear comfortable clothing and clothing appropriate for easy access to any Portacath or PICC line.  ? ?We strive to give you quality time with your provider. You may need to reschedule your appointment if you arrive late (15 or more minutes).  Arriving late affects you and other patients whose appointments are after yours.  Also, if you miss three or more appointments without notifying the office, you may be dismissed from the clinic at the provider?s discretion.    ?  ?For prescription refill requests, have your pharmacy contact our office and allow 72 hours for refills to be completed.   ? ?Today you received the following chemotherapy and/or immunotherapy agents - atezolizumab    ?  ?To help prevent nausea and vomiting after your treatment, we encourage you to take your nausea medication as directed. ? ?BELOW ARE SYMPTOMS THAT SHOULD BE REPORTED IMMEDIATELY: ?*FEVER GREATER THAN 100.4 F (38 ?C) OR HIGHER ?*CHILLS OR SWEATING ?*NAUSEA AND VOMITING THAT IS NOT CONTROLLED WITH YOUR NAUSEA MEDICATION ?*UNUSUAL SHORTNESS OF BREATH ?*UNUSUAL BRUISING OR BLEEDING ?*URINARY PROBLEMS (pain or burning when urinating, or frequent urination) ?*BOWEL PROBLEMS (unusual diarrhea, constipation, pain near the anus) ?TENDERNESS IN MOUTH AND THROAT WITH OR WITHOUT PRESENCE OF ULCERS (sore throat, sores in mouth, or a toothache) ?UNUSUAL RASH, SWELLING OR PAIN  ?UNUSUAL VAGINAL DISCHARGE OR ITCHING  ? ?Items with * indicate a potential emergency and should be followed up as soon as possible or go to the Emergency Department if any problems should occur. ? ?Please show the CHEMOTHERAPY ALERT CARD or IMMUNOTHERAPY ALERT CARD at check-in  to the Emergency Department and triage nurse. ? ?Should you have questions after your visit or need to cancel or reschedule your appointment, please contact [facility] [place] [facility] [place]  [phone] and follow the prompts.  Office hours are 8:00 a.m. to 4:30 p.m. Monday - Friday. Please note that voicemails left after 4:00 p.m. may not be returned until the following business day.  We are closed weekends and major holidays. You have access to a nurse at all times for urgent questions. Please call the main number to the clinic [phone] and follow the prompts. ? ?For any non-urgent questions, you may also contact your provider using MyChart. We now offer e-Visits for anyone 85 and older to request care online for non-urgent symptoms. For details visit [email or web address]. ?  ?Also download the MyChart app! Go to the app store, search "MyChart", open the app, select [facility], and log in with your MyChart username and password. ? ?Due to Covid, a mask is required upon entering the hospital/clinic. If you do not have a mask, one will be given to you upon arrival. For doctor visits, patients may have 1 support person aged 86 or older with them. For treatment visits, patients cannot have anyone with them due to current Covid guidelines and our immunocompromised population.  ? ?Atezolizumab injection ?What is this medication? ?ATEZOLIZUMAB (a te zoe LIZ ue mab) is a monoclonal antibody. It is used to treat bladder cancer (urothelial cancer), liver cancer, lung cancer, and melanoma. ?This medicine may be used for other purposes; ask your health care provider or pharmacist if you have questions. ?COMMON BRAND NAME(S):  Tecentriq ?What should I tell my care team before I take this medication? ?They need to know if you have any of these conditions: ?autoimmune diseases like Crohn's disease, ulcerative colitis, or lupus ?have had or planning to have an allogeneic stem cell transplant (uses someone  else's stem cells) ?history of organ transplant ?history of radiation to the chest ?nervous system problems like myasthenia gravis or Guillain-Barre syndrome ?an unusual or allergic reaction to atezolizumab, other medicines, foods, dyes, or preservatives ?pregnant or trying to get pregnant ?breast-feeding ?How should I use this medication? ?This medicine is for infusion into a vein. It is given by a health care professional in a hospital or clinic setting. ?A special MedGuide will be given to you before each treatment. Be sure to read this information carefully each time. ?Talk to your pediatrician regarding the use of this medicine in children. Special care may be needed. ?Overdosage: If you think you have taken too much of this medicine contact a poison control center or emergency room at once. ?NOTE: This medicine is only for you. Do not share this medicine with others. ?What if I miss a dose? ?It is important not to miss your dose. Call your doctor or health care professional if you are unable to keep an appointment. ?What may interact with this medication? ?Interactions have not been studied. ?This list may not describe all possible interactions. Give your health care provider a list of all the medicines, herbs, non-prescription drugs, or dietary supplements you use. Also tell them if you smoke, drink alcohol, or use illegal drugs. Some items may interact with your medicine. ?What should I watch for while using this medication? ?Your condition will be monitored carefully while you are receiving this medicine. ?You may need blood work done while you are taking this medicine. ?Do not become pregnant while taking this medicine or for at least 5 months after stopping it. Women should inform their doctor if they wish to become pregnant or think they might be pregnant. There is a potential for serious side effects to an unborn child. Talk to your health care professional or pharmacist for more information. Do not  breast-feed an infant while taking this medicine or for at least 5 months after the last dose. ?What side effects may I notice from receiving this medication? ?Side effects that you should report to your doctor or health care professional as soon as possible: ?allergic reactions like skin rash, itching or hives, swelling of the face, lips, or tongue ?black, tarry stools ?bloody or watery diarrhea ?breathing problems ?changes in vision ?chest pain or chest tightness ?chills ?facial flushing ?fever ?headache ?signs and symptoms of high blood sugar such as dizziness; dry mouth; dry skin; fruity breath; nausea; stomach pain; increased hunger or thirst; increased urination ?signs and symptoms of liver injury like dark yellow or brown urine; general ill feeling or flu-like symptoms; light-colored stools; loss of appetite; nausea; right upper belly pain; unusually weak or tired; yellowing of the eyes or skin ?stomach pain ?trouble passing urine or change in the amount of urine ?Side effects that usually do not require medical attention (report to your doctor or health care professional if they continue or are bothersome): ?bone pain ?cough ?diarrhea ?joint pain ?muscle pain ?muscle weakness ?swelling of arms or legs ?tiredness ?weight loss ?This list may not describe all possible side effects. Call your doctor for medical advice about side effects. You may report side effects to FDA at 1-800-FDA-1088. ?Where should I keep my medication? ?This  drug is given in a hospital or clinic and will not be stored at home. ?NOTE: This sheet is a summary. It may not cover all possible information. If you have questions about this medicine, talk to your doctor, pharmacist, or health care provider. ?? 2022 Elsevier/Gold Standard (2021-02-22 00:00:00) ? ?

## 2021-08-30 NOTE — Progress Notes (Signed)
? ?  ?Palliative Medicine ?[place] at [facility] ?[phone] [phone] ? ? ?Name: Natalie [name] ?Date: 08/30/2021 ?MRN: [id number]  ?DOB: March 24, 1961 ? ?Patient Care Team: ?Center, [facility] as PCP - General ?Telford Nab, RN as Oncology Nurse Navigator ?Lloyd Huger, MD as Consulting Physician (Hematology and Oncology)  ? ? ?REASON FOR CONSULTATION: ?Natalie [name] is a 61 y.o. 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  Patient is status post systemic chemotherapy.  She was treated with Tagrisso until disease progression.  Patient is now on immunotherapy.  She is referred to palliative care to help address goals and manage ongoing symptoms. ? ?SOCIAL HISTORY:    ? reports that she quit smoking about 11 years ago. Her smoking use included cigarettes. She has a 76.00 pack-year smoking history. She has quit using smokeless tobacco. She reports current alcohol use. She reports that she does not currently use drugs. ? ?Patient is unmarried.  She lives at home with a son.  In total, she has 6 adult children.  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 ? ?ADVANCE DIRECTIVES:  ?None on file ? ?CODE STATUS:  ? ?PAST MEDICAL HISTORY: ?Past Medical History:  ?Diagnosis Date  ? Asthma   ? COPD (chronic obstructive pulmonary disease) ([place])   ? CVA (cerebral vascular accident) [facility] 2014  ? Depression   ? Diabetes mellitus ([place])   ? Diabetes mellitus without complication ([place])   ? Hepatitis C   ? History of kidney stones   ? Hyperlipidemia   ? Iron deficiency anemia   ? Obesity   ? OSA on CPAP   ? Mild, noncompliant with CPAP said cpap machine was recalled, is waiting for a replacement   ? Paranoia ([place])   ? Schizoaffective disorder, bipolar type ([place])   ? ? ?PAST SURGICAL HISTORY:  ?Past Surgical History:  ?Procedure Laterality  Date  ? BREAST CYST EXCISION Bilateral 2013  ? Benign  ? CESAREAN SECTION    ? CHOLECYSTECTOMY    ? COLONOSCOPY    ? ESOPHAGOGASTRODUODENOSCOPY  2020  ? done in [place]  ? INGUINAL HERNIA REPAIR Left 2002  ? INTERCOSTAL NERVE BLOCK N/A 07/12/2020  ? Procedure: INTERCOSTAL NERVE BLOCK;  Surgeon: Melrose Nakayama, MD;  Location: [place];  Service: Thoracic;  Laterality: N/A;  ? IR IMAGING GUIDED PORT INSERTION  08/20/2020  ? LYMPH NODE DISSECTION Left 07/12/2020  ? Procedure: LYMPH NODE DISSECTION;  Surgeon: Melrose Nakayama, MD;  Location: [place];  Service: Thoracic;  Laterality: Left;  ? UMBILICAL HERNIA REPAIR    ? VIDEO BRONCHOSCOPY WITH ENDOBRONCHIAL NAVIGATION N/A 06/07/2020  ? Procedure: VIDEO BRONCHOSCOPY WITH ENDOBRONCHIAL NAVIGATION;  Surgeon: Tyler Pita, MD;  Location: [facility];  Service: Pulmonary;  Laterality: N/A;  ? ? ?HEMATOLOGY/ONCOLOGY HISTORY:  ?Oncology History  ?Primary adenocarcinoma of lower lobe of left lung ([place])  ?05/08/2020 Initial Diagnosis  ? Primary adenocarcinoma of lower lobe of left lung ([place]) ?  ?07/27/2020 Cancer Staging  ? Staging form: Lung, AJCC 8th Edition ?- Pathologic stage from 07/27/2020: Stage IVB (pT2b, pN1, cM1c) - Signed by Lloyd Huger, MD on 02/02/2021 ? ?  ?08/26/2020 - 10/28/2020 Chemotherapy  ? Patient is on Treatment Plan : LUNG NSCLC Pemetrexed ([place]) + Cisplatin q21d x 4 cycles  ?   ?08/09/2021 -  Chemotherapy  ? Patient is on Treatment  Plan : LUNG NSCLC Atezolizumab q21d  ?   ? ? ?ALLERGIES:  has No Known Allergies. ? ?MEDICATIONS:  ?Current Outpatient Medications  ?Medication Sig Dispense Refill  ? acetaminophen (TYLENOL) 650 MG CR tablet Take 650 mg by mouth 2 (two) times daily.    ? albuterol (VENTOLIN HFA) 108 (90 Base) MCG/ACT inhaler Inhale 2 puffs into the lungs every 6 (six) hours as needed for wheezing or shortness of breath.    ? ASPERCREME ORIGINAL 10 % cream Apply topically.    ? ASPIRIN LOW DOSE 81 MG EC tablet Take 81 mg by mouth daily.    ?  atorvastatin (LIPITOR) 40 MG tablet Take 40 mg by mouth at bedtime.    ? benztropine (COGENTIN) 1 MG tablet Take 1 mg by mouth at bedtime.    ? citalopram (CELEXA) 20 MG tablet Take 20 mg by mouth every morning.    ? clobetasol cream (TEMOVATE) 0.05 % Apply to chest area twice daily as needed for itching/rash (Patient not taking: Reported on 08/30/2021) 30 g 1  ? Dulaglutide (TRULICITY) 1.5 VE/7.2CN SOPN Inject 1.5 mg into the skin every Friday.    ? Fluticasone-Umeclidin-Vilant (TRELEGY ELLIPTA) 100-62.5-25 MCG/INH AEPB Inhale 1 puff into the lungs daily.    ? gabapentin (NEURONTIN) 300 MG capsule Take 300 mg by mouth 2 (two) times daily.    ? insulin detemir (LEVEMIR) 100 UNIT/ML injection Inject 30 Units into the skin daily.    ? latanoprost (XALATAN) 0.005 % ophthalmic solution Place 1 drop into both eyes daily.    ? lidocaine (LIDODERM) 5 % Place 2 patches onto the skin See admin instructions. Apply 1 patch to each foot every 12 hours    ? lidocaine-prilocaine (EMLA) cream Apply to affected area once 30 g 3  ? Loperamide HCl (IMODIUM PO) SMARTSIG:1 Capsule(s) By Mouth 1-2 Times Daily PRN (Patient not taking: Reported on 08/30/2021)    ? LORazepam (ATIVAN) 1 MG tablet Take 1 mg by mouth 2 (two) times daily.    ? LYRICA 100 MG capsule Take 100 mg by mouth 2 (two) times daily. (Patient not taking: Reported on 08/30/2021)    ? osimertinib mesylate (TAGRISSO) 40 MG tablet Take 1 tablet (40 mg total) by mouth daily. Do not start until given okay by oncologist. (Patient not taking: Reported on 08/30/2021) 30 tablet 2  ? prochlorperazine (COMPAZINE) 10 MG tablet Take 1 tablet (10 mg total) by mouth every 6 (six) hours as needed (Nausea or vomiting). 60 tablet 1  ? SENNA PLUS 8.6-50 MG tablet Take 1 tablet by mouth at bedtime. (Patient not taking: Reported on 08/30/2021)    ? traMADol (ULTRAM) 50 MG tablet Take 50 mg by mouth every 6 (six) hours as needed.    ? ziprasidone (GEODON) 80 MG capsule Take 80 mg by mouth at  bedtime.    ? ?No current facility-administered medications for this visit.  ? ?Facility-Administered Medications Ordered in Other Visits  ?Medication Dose Route Frequency Provider Last Rate Last Admin  ? atezolizumab (TECENTRIQ) 1,200 mg in sodium chloride 0.9 % 250 mL chemo infusion  1,200 mg Intravenous Once Lloyd Huger, MD 540 mL/hr at 08/30/21 1116 1,200 mg at 08/30/21 1116  ? heparin lock flush 100 unit/mL  500 Units Intracatheter Once PRN Lloyd Huger, MD      ? sodium chloride flush (NS) 0.9 % injection 10 mL  10 mL Intracatheter PRN Lloyd Huger, MD      ? ? ?VITAL SIGNS: ?  There were no vitals taken for this visit. ?There were no vitals filed for this visit.  ?Estimated body mass index is 38.67 kg/m? as calculated from the following: ?  Height as of an earlier encounter on 08/30/21: 5\' 2"  (1.575 m). ?  Weight as of an earlier encounter on 08/30/21: 211 lb 6.4 oz (95.9 kg). ? ?LABS: ?CBC: ?   ?Component Value Date/Time  ? WBC 9.6 08/30/2021 0939  ? HGB 9.9 (L) 08/30/2021 0939  ? HCT 31.3 (L) 08/30/2021 0939  ? PLT 343 08/30/2021 0939  ? MCV 89.2 08/30/2021 0939  ? NEUTROABS 7.0 08/30/2021 0939  ? LYMPHSABS 1.9 08/30/2021 0939  ? MONOABS 0.5 08/30/2021 0939  ? EOSABS 0.1 08/30/2021 0939  ? BASOSABS 0.0 08/30/2021 0939  ? ?Comprehensive Metabolic Panel: ?   ?Component Value Date/Time  ? NA 139 08/30/2021 0939  ? K 3.6 08/30/2021 0939  ? CL 107 08/30/2021 0939  ? CO2 20 (L) 08/30/2021 0939  ? BUN 11 08/30/2021 0939  ? CREATININE 1.46 (H) 08/30/2021 8638  ? GLUCOSE 170 (H) 08/30/2021 0939  ? CALCIUM 9.4 08/30/2021 0939  ? AST 41 08/30/2021 0939  ? ALT 25 08/30/2021 0939  ? ALKPHOS 163 (H) 08/30/2021 1771  ? BILITOT 0.8 08/30/2021 0939  ? PROT 7.7 08/30/2021 0939  ? ALBUMIN 4.1 08/30/2021 0939  ? ? ?RADIOGRAPHIC STUDIES: ?No results found. ? ?PERFORMANCE STATUS (ECOG) : 1 - Symptomatic but completely ambulatory ? ?Review of Systems ?Unless otherwise noted, a complete review of systems is  negative. ? ?Physical Exam ?General: NAD ?Pulmonary: Unlabored ?Abdomen: soft, nontender, + bowel sounds ?Extremities: no edema, no joint deformities ?Skin: no rashes ?Neurological: Weakness but otherwise nonfocal

## 2021-08-30 NOTE — Progress Notes (Signed)
Pt c/o b/l aching in legs. Pt SOB on exertion ?

## 2021-08-31 LAB — T4: T4, Total: 6.7 ug/dL (ref 4.5–12.0)

## 2021-09-07 ENCOUNTER — Emergency Department: Payer: Medicaid Other

## 2021-09-07 ENCOUNTER — Inpatient Hospital Stay
Admission: EM | Admit: 2021-09-07 | Discharge: 2021-09-09 | DRG: 054 | Disposition: A | Discharge status: Skilled Nursing Facility | Payer: Medicaid Other | Attending: Internal Medicine | Admitting: Internal Medicine

## 2021-09-07 ENCOUNTER — Encounter: Payer: Self-pay | Admitting: Emergency Medicine

## 2021-09-07 ENCOUNTER — Other Ambulatory Visit: Payer: Self-pay

## 2021-09-07 DIAGNOSIS — Z8619 Personal history of other infectious and parasitic diseases: Secondary | ICD-10-CM

## 2021-09-07 DIAGNOSIS — Z791 Long term (current) use of non-steroidal anti-inflammatories (NSAID): Secondary | ICD-10-CM

## 2021-09-07 DIAGNOSIS — Z20822 Contact with and (suspected) exposure to covid-19: Secondary | ICD-10-CM | POA: Diagnosis present

## 2021-09-07 DIAGNOSIS — E118 Type 2 diabetes mellitus with unspecified complications: Secondary | ICD-10-CM | POA: Diagnosis not present

## 2021-09-07 DIAGNOSIS — Z87891 Personal history of nicotine dependence: Secondary | ICD-10-CM

## 2021-09-07 DIAGNOSIS — D649 Anemia, unspecified: Secondary | ICD-10-CM

## 2021-09-07 DIAGNOSIS — Z7951 Long term (current) use of inhaled steroids: Secondary | ICD-10-CM

## 2021-09-07 DIAGNOSIS — Z79899 Other long term (current) drug therapy: Secondary | ICD-10-CM

## 2021-09-07 DIAGNOSIS — T451X5A Adverse effect of antineoplastic and immunosuppressive drugs, initial encounter: Secondary | ICD-10-CM | POA: Diagnosis present

## 2021-09-07 DIAGNOSIS — C3490 Malignant neoplasm of unspecified part of unspecified bronchus or lung: Secondary | ICD-10-CM | POA: Diagnosis present

## 2021-09-07 DIAGNOSIS — E785 Hyperlipidemia, unspecified: Secondary | ICD-10-CM | POA: Diagnosis present

## 2021-09-07 DIAGNOSIS — I129 Hypertensive chronic kidney disease with stage 1 through stage 4 chronic kidney disease, or unspecified chronic kidney disease: Secondary | ICD-10-CM | POA: Diagnosis present

## 2021-09-07 DIAGNOSIS — R531 Weakness: Secondary | ICD-10-CM | POA: Diagnosis present

## 2021-09-07 DIAGNOSIS — Z794 Long term (current) use of insulin: Secondary | ICD-10-CM

## 2021-09-07 DIAGNOSIS — F418 Other specified anxiety disorders: Secondary | ICD-10-CM | POA: Diagnosis not present

## 2021-09-07 DIAGNOSIS — G936 Cerebral edema: Secondary | ICD-10-CM | POA: Diagnosis present

## 2021-09-07 DIAGNOSIS — Z7982 Long term (current) use of aspirin: Secondary | ICD-10-CM | POA: Diagnosis not present

## 2021-09-07 DIAGNOSIS — D6481 Anemia due to antineoplastic chemotherapy: Secondary | ICD-10-CM | POA: Diagnosis present

## 2021-09-07 DIAGNOSIS — Z6838 Body mass index (BMI) 38.0-38.9, adult: Secondary | ICD-10-CM | POA: Diagnosis not present

## 2021-09-07 DIAGNOSIS — E876 Hypokalemia: Secondary | ICD-10-CM | POA: Diagnosis present

## 2021-09-07 DIAGNOSIS — Z87442 Personal history of urinary calculi: Secondary | ICD-10-CM | POA: Diagnosis not present

## 2021-09-07 DIAGNOSIS — E669 Obesity, unspecified: Secondary | ICD-10-CM | POA: Diagnosis present

## 2021-09-07 DIAGNOSIS — N183 Chronic kidney disease, stage 3 unspecified: Secondary | ICD-10-CM | POA: Diagnosis not present

## 2021-09-07 DIAGNOSIS — D630 Anemia in neoplastic disease: Secondary | ICD-10-CM | POA: Diagnosis present

## 2021-09-07 DIAGNOSIS — J449 Chronic obstructive pulmonary disease, unspecified: Secondary | ICD-10-CM | POA: Diagnosis present

## 2021-09-07 DIAGNOSIS — N1831 Chronic kidney disease, stage 3a: Secondary | ICD-10-CM | POA: Diagnosis present

## 2021-09-07 DIAGNOSIS — C7931 Secondary malignant neoplasm of brain: Secondary | ICD-10-CM | POA: Diagnosis present

## 2021-09-07 DIAGNOSIS — Z8673 Personal history of transient ischemic attack (TIA), and cerebral infarction without residual deficits: Secondary | ICD-10-CM | POA: Diagnosis not present

## 2021-09-07 DIAGNOSIS — F250 Schizoaffective disorder, bipolar type: Secondary | ICD-10-CM | POA: Diagnosis present

## 2021-09-07 DIAGNOSIS — Z91199 Patient's noncompliance with other medical treatment and regimen due to unspecified reason: Secondary | ICD-10-CM | POA: Diagnosis not present

## 2021-09-07 DIAGNOSIS — E1122 Type 2 diabetes mellitus with diabetic chronic kidney disease: Secondary | ICD-10-CM | POA: Diagnosis present

## 2021-09-07 DIAGNOSIS — G4733 Obstructive sleep apnea (adult) (pediatric): Secondary | ICD-10-CM | POA: Diagnosis present

## 2021-09-07 DIAGNOSIS — F419 Anxiety disorder, unspecified: Secondary | ICD-10-CM | POA: Diagnosis present

## 2021-09-07 DIAGNOSIS — Z7985 Long-term (current) use of injectable non-insulin antidiabetic drugs: Secondary | ICD-10-CM

## 2021-09-07 LAB — DIFFERENTIAL
Abs Immature Granulocytes: 0.02 10*3/uL (ref 0.00–0.07)
Basophils Absolute: 0 10*3/uL (ref 0.0–0.1)
Basophils Relative: 0 %
Eosinophils Absolute: 0 10*3/uL (ref 0.0–0.5)
Eosinophils Relative: 0 %
Immature Granulocytes: 0 %
Lymphocytes Relative: 19 %
Lymphs Abs: 1.4 10*3/uL (ref 0.7–4.0)
Monocytes Absolute: 0.4 10*3/uL (ref 0.1–1.0)
Monocytes Relative: 5 %
Neutro Abs: 5.7 10*3/uL (ref 1.7–7.7)
Neutrophils Relative %: 76 %

## 2021-09-07 LAB — CBC
HCT: 27.7 % — ABNORMAL LOW (ref 36.0–46.0)
Hemoglobin: 8.5 g/dL — ABNORMAL LOW (ref 12.0–15.0)
MCH: 27.5 pg (ref 26.0–34.0)
MCHC: 30.7 g/dL (ref 30.0–36.0)
MCV: 89.6 fL (ref 80.0–100.0)
Platelets: 225 10*3/uL (ref 150–400)
RBC: 3.09 MIL/uL — ABNORMAL LOW (ref 3.87–5.11)
RDW: 16.7 % — ABNORMAL HIGH (ref 11.5–15.5)
WBC: 7.5 10*3/uL (ref 4.0–10.5)
nRBC: 0 % (ref 0.0–0.2)

## 2021-09-07 LAB — COMPREHENSIVE METABOLIC PANEL
ALT: 44 U/L (ref 0–44)
AST: 49 U/L — ABNORMAL HIGH (ref 15–41)
Albumin: 3.6 g/dL (ref 3.5–5.0)
Alkaline Phosphatase: 165 U/L — ABNORMAL HIGH (ref 38–126)
Anion gap: 9 (ref 5–15)
BUN: 10 mg/dL (ref 8–23)
CO2: 25 mmol/L (ref 22–32)
Calcium: 8.8 mg/dL — ABNORMAL LOW (ref 8.9–10.3)
Chloride: 102 mmol/L (ref 98–111)
Creatinine, Ser: 1.24 mg/dL — ABNORMAL HIGH (ref 0.44–1.00)
GFR, Estimated: 49 mL/min — ABNORMAL LOW (ref >60)
Glucose, Bld: 128 mg/dL — ABNORMAL HIGH (ref 70–99)
Potassium: 3.4 mmol/L — ABNORMAL LOW (ref 3.5–5.1)
Sodium: 136 mmol/L (ref 135–145)
Total Bilirubin: 1 mg/dL (ref 0.3–1.2)
Total Protein: 7.1 g/dL (ref 6.5–8.1)

## 2021-09-07 LAB — ETHANOL: Alcohol, Ethyl (B): <10 mg/dL (ref <10)

## 2021-09-07 LAB — RESP PANEL BY RT-PCR (FLU A&B, COVID) ARPGX2
Influenza A by PCR: NEGATIVE
Influenza B by PCR: NEGATIVE
SARS Coronavirus 2 by RT PCR: NEGATIVE

## 2021-09-07 LAB — GLUCOSE, CAPILLARY
Glucose-Capillary: 144 mg/dL — ABNORMAL HIGH (ref 70–99)
Glucose-Capillary: 216 mg/dL — ABNORMAL HIGH (ref 70–99)

## 2021-09-07 LAB — APTT: aPTT: 40 seconds — ABNORMAL HIGH (ref 24–36)

## 2021-09-07 LAB — PROTIME-INR
INR: 1.1 (ref 0.8–1.2)
Prothrombin Time: 14 seconds (ref 11.4–15.2)

## 2021-09-07 IMAGING — MR MR HEAD WO/W CM
13 series · 46 of 48 positions shown · IV contrast (10ml Gadavist)
Comparison: Same-day noncontrast head CT, MR head [DATE]

CLINICAL DATA: Brain metastases seen on CT, history of lung cancer

EXAM:
MRI HEAD WITHOUT AND WITH CONTRAST
TECHNIQUE: Multiplanar, multiecho pulse sequences of the brain and surrounding
structures were obtained without and with intravenous contrast.
CONTRAST:  10mL GADAVIST GADOBUTROL 1 MMOL/ML IV SOLN

[Series 5: ax dwi_tracew · axial · 3.0mm · 0.71mm/px · z∈[-115,+50]mm · 6 of 56 slices shown]
[im 1/56]
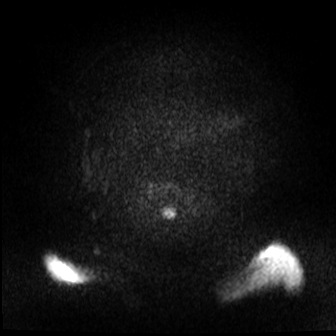
[im 12/56]
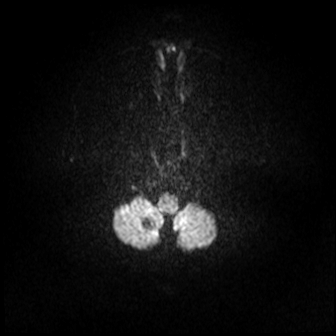
[im 23/56]
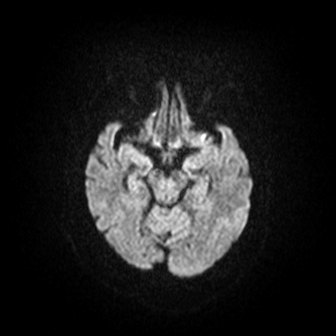
[im 34/56]
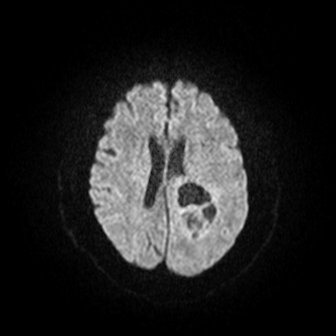
[im 45/56]
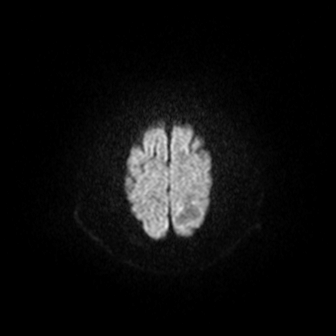
[im 56/56]
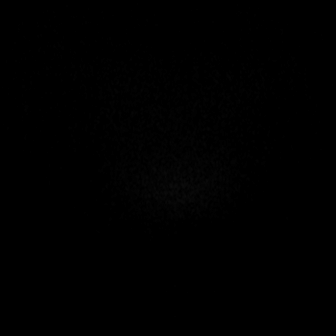

[Series 6: ax dwi_adc · axial · 3.0mm · 0.71mm/px · z∈[-115,+47]mm · 6 of 55 slices shown]
[im 1/55]
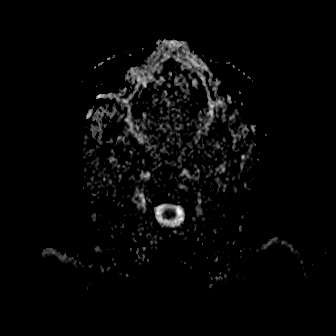
[im 11/55]
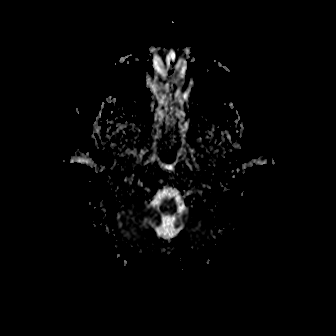
[im 22/55]
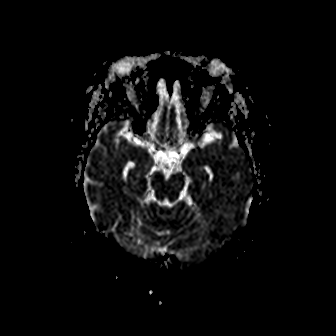
[im 33/55]
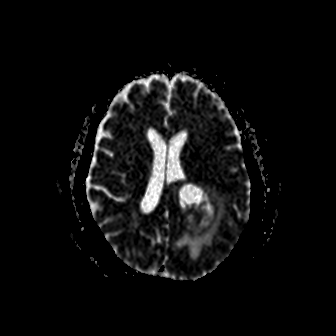
[im 44/55]
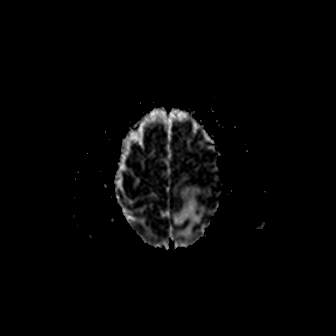
[im 55/55]
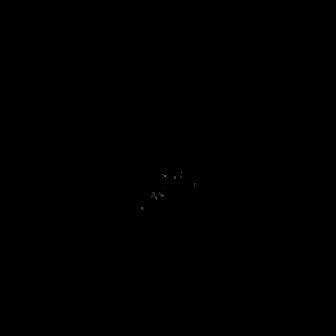

[Series 7: cor dwi_tracew · coronal · 5.0mm · 0.68mm/px · 3 of 36 slices shown]
[im 1/36]
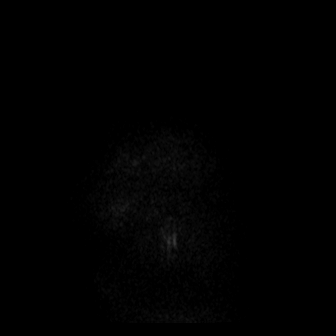
[im 18/36]
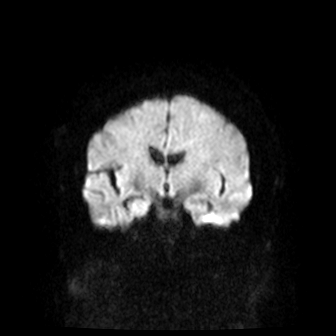
[im 36/36]
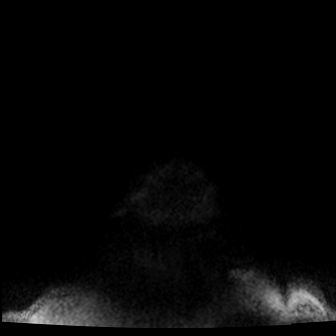

[Series 8: cor dwi_adc · coronal · 5.0mm · 0.68mm/px · 3 of 36 slices shown]
[im 1/36]
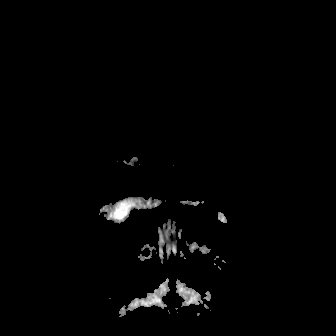
[im 18/36]
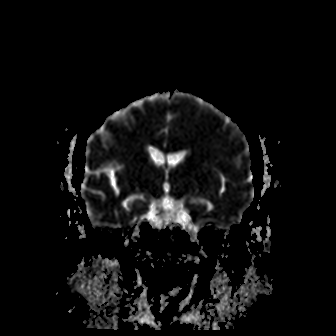
[im 36/36]
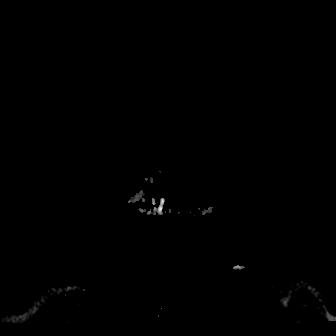

[Series 9: T1 · sagittal · 5.0mm · 0.62mm/px · 2 of 21 slices shown (1 of 2)]
[im 1/21]
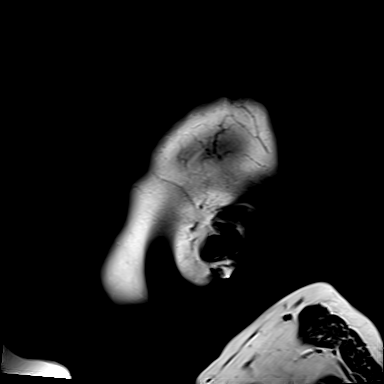
[im 21/21]
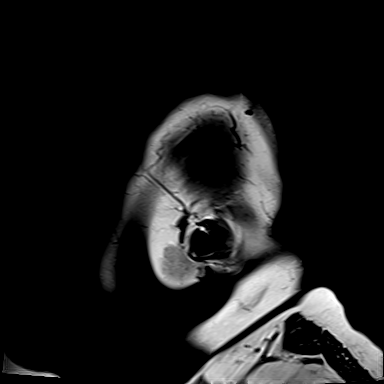

[Series 10: T2 · axial · 5.0mm · 0.53mm/px · z∈[-101,+43]mm · 2 of 25 slices shown]
[im 1/25]
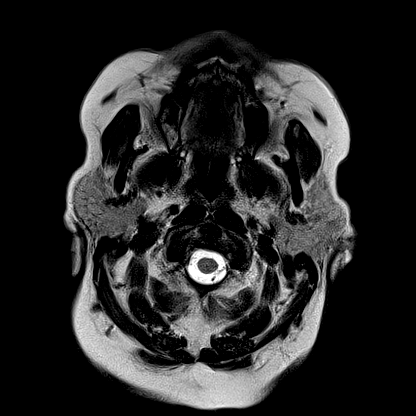
[im 25/25]
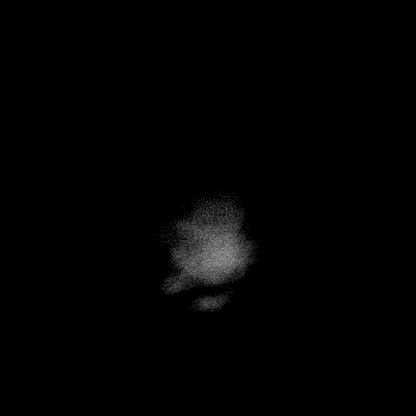

[Series 11: T2-star · axial · 5.0mm · 0.45mm/px · z∈[-101,+43]mm · 2 of 25 slices shown]
[im 1/25]
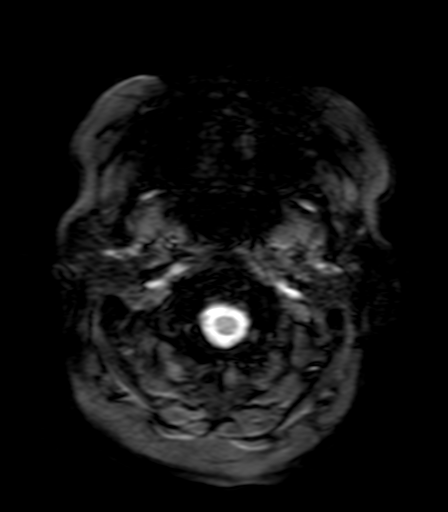
[im 25/25]
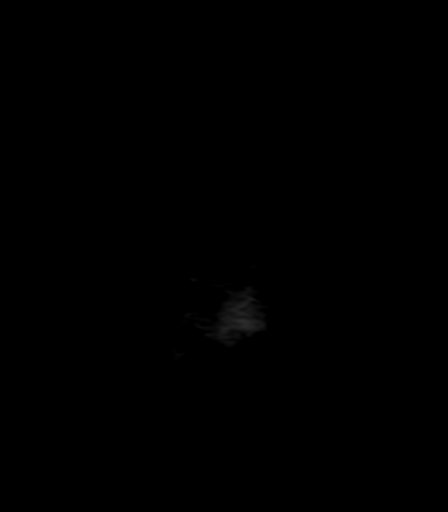

[Series 12: FLAIR · axial · 5.0mm · 1.20mm/px · z∈[-100,+43]mm · 2 of 25 slices shown]
[im 1/25]
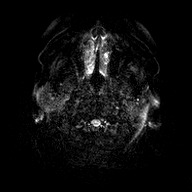
[im 25/25]
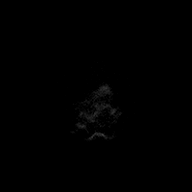

[Series 13: T1 · axial · 5.0mm · 0.90mm/px · z∈[-101,+43]mm · 2 of 25 slices shown (2 of 2)]
[im 1/25]
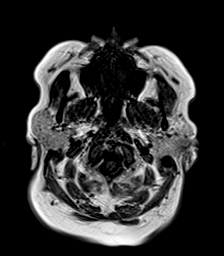
[im 25/25]
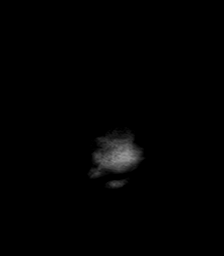

[Series 15: T1 post-contrast · axial · 5.0mm · 0.90mm/px · z∈[-101,+43]mm · 2 of 25 slices shown (1 of 4)]
[im 1/25]
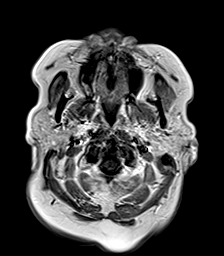
[im 25/25]
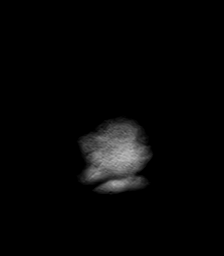

[Series 16: T1 post-contrast · coronal · 5.0mm · 0.90mm/px · 3 of 31 slices shown (2 of 4)]
[im 1/31]
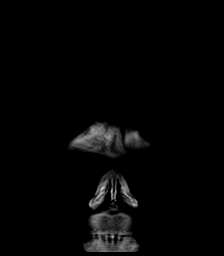
[im 16/31]
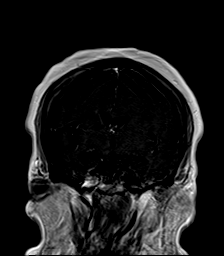
[im 31/31]
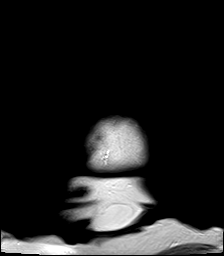

[Series 17: T1 post-contrast · sagittal · 5.0mm · 0.62mm/px · 2 of 21 slices shown (3 of 4)]
[im 1/21]
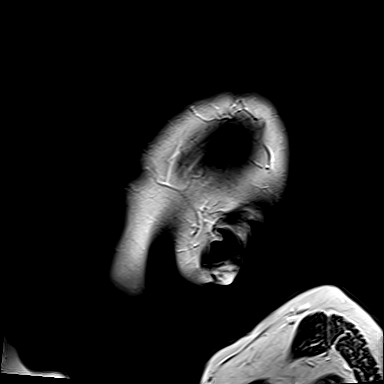
[im 21/21]
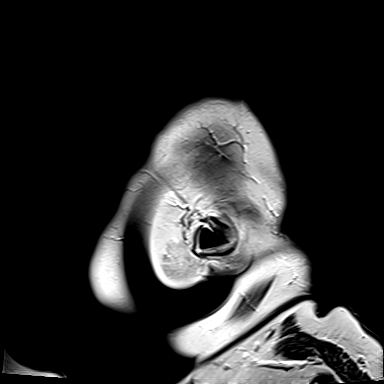

[Series 18: T1 post-contrast · axial · 1.0mm · 0.98mm/px · z∈[-97,+45]mm · 11 of 144 slices shown (4 of 4)]
[im 1/144]
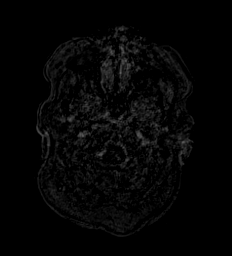
[im 12/144]
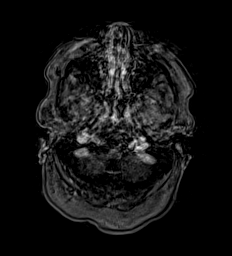
[im 24/144]
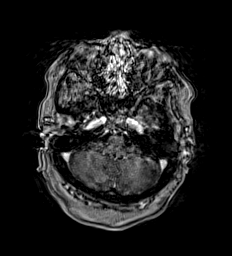
[im 36/144]
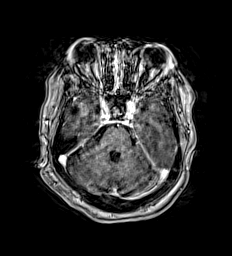
[im 48/144]
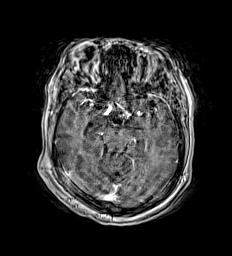
[im 60/144]
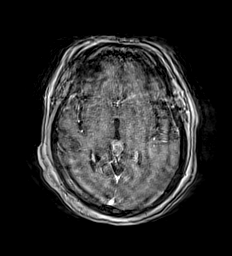
[im 72/144]
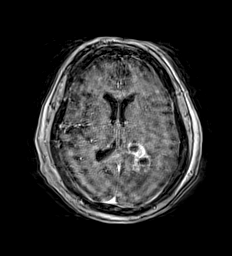
[im 84/144]
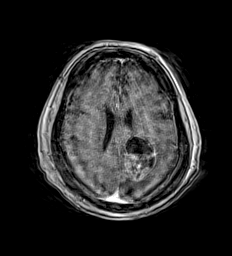
[im 96/144]
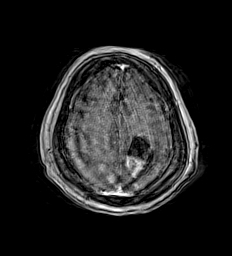
[im 120/144]
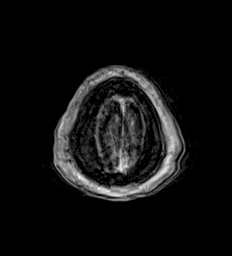
[im 144/144]
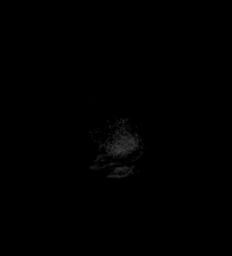

[46 of 48 positions shown; findings below may reference images not displayed]

FINDINGS: Brain: Numerous metastatic lesions are seen:

*The dominant solid and cystic lesion is centered in the left
parietal lobe measuring up to 4.7 cm by 4.0 cm in maximum dimension
(sagittal image 17-14). There is surrounding edema with regional
mass effect resulting in sulcal effacement and partial effacement of
the left lateral ventricle.
*8 mm satellite lesion posterior and superior to the dominant left
parietal lobe lesion (15-17).
*8 mm lesion in the right paracentral lobule (15-19).
*Approximally 3 mm lesion in the right centrum semiovale (15-17).
*1.1 cm AP x 0.6 cm TV lesion in the right middle frontal gyrus
anteriorly. This corresponds to the hyperdense lesion seen on the
prior CT. There is no susceptibility artifact to suggest
intralesional hemorrhage as was questioned on the prior CT.
*4 mm lesion in the right superior frontal gyrus anteriorly (15-14).
*5 mm lesion in the right frontal lobe more inferiorly (15-13).
*Numerous lesions in the right cerebellar hemisphere measuring
cm x 0.6 cm (15-7), 1.3 cm x 0.9 cm (15-5), and 1.3 cm x 0.9 cm
(15-4).
*The pineal gland appears enlarged with peripheral enhancement
measuring up to 1.1 cm AP x 1.1 cm TV, new compared to the prior MRI
from [DATE] suspicious for a pineal metastatic lesion.
*Additional smaller metastatic lesions can not be entirely excluded
given motion degraded images.

There is no evidence of acute territorial infarct or hemorrhage. As
above, there is no evidence of intralesional hemorrhage associated
with above-described metastases as was suspected on the prior CT.
Mass effect from the largest lesion result in trace rightward
midline shift. The basal cisterns are patent.

Sequela of mild chronic white matter microangiopathy is again seen.

Vascular: Normal flow voids.

Skull and upper cervical spine: There is no definite suspicious
osseous abnormality.

Sinuses/Orbits: The paranasal sinuses are clear. Bilateral lens
implants are in place. The globes and orbits are otherwise
unremarkable.

Other: None.
IMPRESSION: 1. At least 10 intraparenchymal metastatic lesions described in
detail above, the largest dominant solid and cystic lesion centered
in the left parietal lobe measuring up to 4.7 cm with perilesional
edema and regional mass effect resulting in trace rightward midline
shift.
2. Suspected pineal metastatic lesion measuring 1.1 cm.
3. No evidence of associated hemorrhage, as was suspected on the
prior CT.

## 2021-09-07 MED ORDER — ONDANSETRON HCL 4 MG/2ML IJ SOLN
4.0000 mg | Freq: Once | INTRAMUSCULAR | Status: AC
Start: 2021-09-07 — End: 2021-09-07
  Administered 2021-09-07: 4 mg via INTRAVENOUS
  Filled 2021-09-07: qty 2

## 2021-09-07 MED ORDER — INSULIN ASPART 100 UNIT/ML IJ SOLN
0.0000 [IU] | Freq: Three times a day (TID) | INTRAMUSCULAR | Status: DC
  Administered 2021-09-07: 3 [IU] via SUBCUTANEOUS
  Administered 2021-09-08 (×3): 4 [IU] via SUBCUTANEOUS
  Administered 2021-09-09: 3 [IU] via SUBCUTANEOUS
  Filled 2021-09-07 (×5): qty 1

## 2021-09-07 MED ORDER — OXYCODONE-ACETAMINOPHEN 5-325 MG PO TABS
1.0000 | ORAL_TABLET | Freq: Four times a day (QID) | ORAL | Status: DC | PRN
  Administered 2021-09-07 – 2021-09-09 (×6): 1 via ORAL
  Filled 2021-09-07 (×6): qty 1

## 2021-09-07 MED ORDER — DEXAMETHASONE SODIUM PHOSPHATE 10 MG/ML IJ SOLN
10.0000 mg | Freq: Once | INTRAMUSCULAR | Status: AC
  Administered 2021-09-07: 10 mg via INTRAVENOUS
  Filled 2021-09-07: qty 1

## 2021-09-07 MED ORDER — GABAPENTIN 300 MG PO CAPS
300.0000 mg | ORAL_CAPSULE | Freq: Two times a day (BID) | ORAL | Status: DC
  Administered 2021-09-07 – 2021-09-09 (×5): 300 mg via ORAL
  Filled 2021-09-07 (×5): qty 1

## 2021-09-07 MED ORDER — CHLORHEXIDINE GLUCONATE CLOTH 2 % EX PADS
6.0000 | MEDICATED_PAD | Freq: Every day | CUTANEOUS | Status: DC
  Administered 2021-09-07 – 2021-09-09 (×3): 6 via TOPICAL

## 2021-09-07 MED ORDER — SODIUM CHLORIDE 0.9% FLUSH: 10.0000 mL | INTRAVENOUS | Status: DC | PRN

## 2021-09-07 MED ORDER — GADOBUTROL 1 MMOL/ML IV SOLN
10.0000 mL | Freq: Once | INTRAVENOUS | Status: AC | PRN
  Administered 2021-09-07: 10 mL via INTRAVENOUS

## 2021-09-07 MED ORDER — ZIPRASIDONE HCL 80 MG PO CAPS
80.0000 mg | ORAL_CAPSULE | Freq: Every day | ORAL | Status: DC
  Administered 2021-09-07 – 2021-09-08 (×2): 80 mg via ORAL
  Filled 2021-09-07 (×2): qty 1

## 2021-09-07 MED ORDER — ALBUTEROL SULFATE (2.5 MG/3ML) 0.083% IN NEBU: 3.0000 mL | INHALATION_SOLUTION | Freq: Four times a day (QID) | RESPIRATORY_TRACT | Status: DC | PRN

## 2021-09-07 MED ORDER — DULAGLUTIDE 1.5 MG/0.5ML ~~LOC~~ SOAJ: 1.5000 mg | SUBCUTANEOUS | Status: DC

## 2021-09-07 MED ORDER — LORAZEPAM 1 MG PO TABS
1.0000 mg | ORAL_TABLET | Freq: Two times a day (BID) | ORAL | Status: DC
  Administered 2021-09-07 – 2021-09-09 (×4): 1 mg via ORAL
  Filled 2021-09-07 (×5): qty 1

## 2021-09-07 MED ORDER — CITALOPRAM HYDROBROMIDE 20 MG PO TABS
20.0000 mg | ORAL_TABLET | Freq: Every morning | ORAL | Status: DC
  Administered 2021-09-07 – 2021-09-09 (×3): 20 mg via ORAL
  Filled 2021-09-07 (×3): qty 1

## 2021-09-07 MED ORDER — POTASSIUM CHLORIDE 10 MEQ/100ML IV SOLN
10.0000 meq | INTRAVENOUS | Status: DC
  Administered 2021-09-07: 10 meq via INTRAVENOUS
  Filled 2021-09-07: qty 100

## 2021-09-07 MED ORDER — INSULIN DETEMIR 100 UNIT/ML ~~LOC~~ SOLN
30.0000 [IU] | Freq: Every day | SUBCUTANEOUS | Status: DC
  Administered 2021-09-07 – 2021-09-08 (×2): 30 [IU] via SUBCUTANEOUS
  Filled 2021-09-07 (×3): qty 0.3

## 2021-09-07 MED ORDER — MORPHINE SULFATE (PF) 4 MG/ML IV SOLN
4.0000 mg | Freq: Once | INTRAVENOUS | Status: AC
  Administered 2021-09-07: 4 mg via INTRAVENOUS
  Filled 2021-09-07: qty 1

## 2021-09-07 MED ORDER — POTASSIUM CHLORIDE 10 MEQ/100ML IV SOLN
10.0000 meq | Freq: Once | INTRAVENOUS | Status: AC
  Administered 2021-09-08: 10 meq via INTRAVENOUS
  Filled 2021-09-07: qty 100

## 2021-09-07 MED ORDER — ATORVASTATIN CALCIUM 20 MG PO TABS
40.0000 mg | ORAL_TABLET | Freq: Every day | ORAL | Status: DC
  Administered 2021-09-07 – 2021-09-08 (×2): 40 mg via ORAL
  Filled 2021-09-07 (×2): qty 2

## 2021-09-07 MED ORDER — UMECLIDINIUM BROMIDE 62.5 MCG/ACT IN AEPB
1.0000 | INHALATION_SPRAY | Freq: Every day | RESPIRATORY_TRACT | Status: DC
  Administered 2021-09-07 – 2021-09-09 (×3): 1 via RESPIRATORY_TRACT
  Filled 2021-09-07: qty 7

## 2021-09-07 MED ORDER — BENZTROPINE MESYLATE 1 MG PO TABS
1.0000 mg | ORAL_TABLET | Freq: Every day | ORAL | Status: DC
  Administered 2021-09-07 – 2021-09-08 (×2): 1 mg via ORAL
  Filled 2021-09-07 (×2): qty 1

## 2021-09-07 MED ORDER — SODIUM CHLORIDE 0.9% FLUSH
10.0000 mL | Freq: Two times a day (BID) | INTRAVENOUS | Status: DC
  Administered 2021-09-07 – 2021-09-09 (×3): 10 mL

## 2021-09-07 MED ORDER — LORAZEPAM 2 MG/ML IJ SOLN
1.0000 mg | Freq: Once | INTRAMUSCULAR | Status: AC
  Administered 2021-09-07: 1 mg via INTRAVENOUS
  Filled 2021-09-07: qty 1

## 2021-09-07 MED ORDER — LATANOPROST 0.005 % OP SOLN
1.0000 [drp] | Freq: Every day | OPHTHALMIC | Status: DC
  Administered 2021-09-07 – 2021-09-09 (×3): 1 [drp] via OPHTHALMIC
  Filled 2021-09-07: qty 2.5

## 2021-09-07 MED ORDER — DEXAMETHASONE SODIUM PHOSPHATE 4 MG/ML IJ SOLN
4.0000 mg | Freq: Two times a day (BID) | INTRAMUSCULAR | Status: DC
  Administered 2021-09-08 (×2): 4 mg via INTRAVENOUS
  Filled 2021-09-07 (×2): qty 1

## 2021-09-07 MED ORDER — MORPHINE SULFATE (PF) 2 MG/ML IV SOLN
2.0000 mg | INTRAVENOUS | Status: DC | PRN
  Administered 2021-09-08: 2 mg via INTRAVENOUS
  Filled 2021-09-07: qty 1

## 2021-09-07 MED ORDER — FLUTICASONE FUROATE-VILANTEROL 100-25 MCG/ACT IN AEPB
1.0000 | INHALATION_SPRAY | Freq: Every day | RESPIRATORY_TRACT | Status: DC
  Administered 2021-09-07 – 2021-09-09 (×3): 1 via RESPIRATORY_TRACT
  Filled 2021-09-07: qty 28

## 2021-09-07 NOTE — Assessment & Plan Note (Addendum)
?   Trivial  ?? Replete and monitor BMP ?

## 2021-09-07 NOTE — H&P (Addendum)
? ? ?HISTORY AND PHYSICAL ? ?Patient: Natalie [name] 61 y.o. female ?MRN: [id number] ? ?Today 09/07/21 is hospital day 0 after admission on 09/07/2021 11:35 AM ? ?RECORD REVIEW AND HOSPITAL COURSE: ?Natalie [name] is a 61 y.o. female with history of lung cancer, diabetes, hypertension, CVA presents emergency department with weakness of the right hand and right leg.  Last known well was Friday afternoon (5 days prior to admission).  At first she and family thought it was just the foot and now the whole leg she is having to drag when walking. Patient is currently undergoing chemotherapy due to lung cancer with mets. ?In ED: CT head demonstrated large solid and cystic metastatic lesion in left parietal lobe measuring up to 4.8 cm, surrounding vasogenic edema, regional mass effect but no midline shift.  Additional 1.0 cm metastatic lesion in right frontal lobe, suspected intralesional hemorrhage.  Additional metastases right cerebellar hemisphere and periventricular white matter.  ED provider consulted with Dr. Janese Banks and Dr. Grayland Ormond, oncology, recommended starting Decadron 10 mg IV then Decadron 4 mg twice daily with admission, to consult for oncology/radiation oncology and palliative care. ? ?Procedures and Significant Results:  ?CT head 09/07/2021: Brain mets identified likely because of ambulatory dysfunction ?MRI brain in process 09/07/21  ? ?Consultants:  ?Oncology ?Radiation oncology ?Palliative care ? ? ? ?SUBJECTIVE:  ?Patient seen and examined in emergency department, son Natalie [name] present at bedside.  Currently, patient denies any pain, difficulty breathing, headache, change in appetite, dizziness, lightheadedness.  She reports stable right lower extremity weakness, no numbness. ? ? ? ? ?ASSESSMENT & PLAN ? ?Metastatic lung cancer (metastasis from lung to other site) [facility]) - brain (possible spine) mets, resulting in ambulatory dysfunction ?CT Head 09/07/21: "1. Large solid and cystic metastatic lesion in the  left parietal lobe measuring up to 4.8 cm with surrounding vasogenic edema and regional mass effect but no midline shift. 2. Additional 1.0 cm metastatic lesion in the right frontal lobe with suspected intralesional hemorrhage. Additional suspected metastases are seen in the right cerebellar hemisphere and periventricular white matter. 3. Recommend brain MRI with and without contrast." ?Obtain MRI brain: pending as of admission ?Decadron IV as recommended by oncology  ?Place consult for oncology, radiation oncology, anticipate less likely need for neurosurgery but will await recs  ?Place consult for palliative care: Brief discussion of GOC with patient, at this point, she is requesting resuscitative measures in the event of cardiopulmonary arrest including CPR, relation, mechanical ventilation.  Appreciate recommendations from oncology regarding prognosis ?Holding ASA, Rx VTE Ppx d/t possible intralesional hemorrhage in brain mass  ? ? ?Hypokalemia ?Trivial  ?Replete and monitor BMP ? ?CKD (chronic kidney disease) stage 3, GFR 30-59 ml/min (HCC) ?Avoid nephrotoxins as able ?Monitor BMP ? ?Normocytic anemia ?Likely anemia chronic disease / chemotherapy effect ?Monitor CBC, will obtain retic ct, Iron/Ferritin/TIBC, B12/Folate ? ?Diabetes mellitus type 2, controlled, with complications ([place]) ?Continue home basal insulin and will add meal SSI coverage ? ?COPD mixed type ([place]) ?Not in exacerbation ?Continue home inhalers and albuterol/O2 prn ? ?Depression with anxiety ?Continue home Celexa, Ativan, Geodon ? ? ?VTE Ppx: SCD ?CODE STATUS   Code Status: Full Code ?Admitted from: home ?Expected Dispo: home/HH vs SNF depending on ambulatory function and oncologic prognosis  ?Barriers to discharge: continued medical workup and treatment ?Family communication: son at bedside in ED  ? ? ? ? ? ? ? ? ? ? ? ? ? ?Past Medical History:  ?Diagnosis Date  ? Asthma   ? COPD (  chronic obstructive pulmonary disease) ([place])   ? CVA (cerebral  vascular accident) [facility]) 2014  ? Depression   ? Diabetes mellitus ([place])   ? Diabetes mellitus without complication ([place])   ? Hepatitis C   ? History of kidney stones   ? Hyperlipidemia   ? Iron deficiency anemia   ? Obesity   ? OSA on CPAP   ? Mild, noncompliant with CPAP said cpap machine was recalled, is waiting for a replacement   ? Paranoia ([place])   ? Schizoaffective disorder, bipolar type ([place])   ? ? ?Past Surgical History:  ?Procedure Laterality Date  ? BREAST CYST EXCISION Bilateral 2013  ? Benign  ? CESAREAN SECTION    ? CHOLECYSTECTOMY    ? COLONOSCOPY    ? ESOPHAGOGASTRODUODENOSCOPY  2020  ? done in [place]  ? INGUINAL HERNIA REPAIR Left 2002  ? INTERCOSTAL NERVE BLOCK N/A 07/12/2020  ? Procedure: INTERCOSTAL NERVE BLOCK;  Surgeon: Melrose Nakayama, MD;  Location: [place];  Service: Thoracic;  Laterality: N/A;  ? IR IMAGING GUIDED PORT INSERTION  08/20/2020  ? LYMPH NODE DISSECTION Left 07/12/2020  ? Procedure: LYMPH NODE DISSECTION;  Surgeon: Melrose Nakayama, MD;  Location: [place];  Service: Thoracic;  Laterality: Left;  ? UMBILICAL HERNIA REPAIR    ? VIDEO BRONCHOSCOPY WITH ENDOBRONCHIAL NAVIGATION N/A 06/07/2020  ? Procedure: VIDEO BRONCHOSCOPY WITH ENDOBRONCHIAL NAVIGATION;  Surgeon: Tyler Pita, MD;  Location: [facility];  Service: Pulmonary;  Laterality: N/A;  ? ? ?History reviewed. No pertinent family history. ?Social History:  reports that she quit smoking about 11 years ago. Her smoking use included cigarettes. She has a 76.00 pack-year smoking history. She has quit using smokeless tobacco. She reports current alcohol use. She reports that she does not currently use drugs. ? ?Allergies: No Known Allergies ? ?No current facility-administered medications on file prior to encounter.  ? ?Current Outpatient Medications on File Prior to Encounter  ?Medication Sig Dispense Refill  ? acetaminophen (TYLENOL) 500 MG tablet Take 1 tablet by mouth 3 (three) times daily as needed.    ? acetaminophen  (TYLENOL) 650 MG CR tablet Take 650 mg by mouth 2 (two) times daily.    ? albuterol (VENTOLIN HFA) 108 (90 Base) MCG/ACT inhaler Inhale 2 puffs into the lungs every 6 (six) hours as needed for wheezing or shortness of breath.    ? ASPERCREME ORIGINAL 10 % cream Apply topically.    ? ASPIRIN LOW DOSE 81 MG EC tablet Take 81 mg by mouth daily.    ? atorvastatin (LIPITOR) 40 MG tablet Take 40 mg by mouth at bedtime.    ? benztropine (COGENTIN) 1 MG tablet Take 1 mg by mouth at bedtime.    ? citalopram (CELEXA) 20 MG tablet Take 20 mg by mouth every morning.    ? clobetasol cream (TEMOVATE) 0.05 % Apply to chest area twice daily as needed for itching/rash (Patient not taking: Reported on 08/30/2021) 30 g 1  ? Dulaglutide (TRULICITY) 1.5 AS/5.0NL SOPN Inject 1.5 mg into the skin every Friday.    ? Fluticasone-Umeclidin-Vilant (TRELEGY ELLIPTA) 100-62.5-25 MCG/INH AEPB Inhale 1 puff into the lungs daily.    ? folic acid (FOLVITE) 1 MG tablet Take 1 mg by mouth daily.    ? gabapentin (NEURONTIN) 300 MG capsule Take 300 mg by mouth 2 (two) times daily.    ? ibuprofen (ADVIL) 400 MG tablet Take 400 mg by mouth 3 (three) times daily.    ? insulin detemir (  LEVEMIR) 100 UNIT/ML injection Inject 30 Units into the skin daily.    ? insulin detemir (LEVEMIR) 100 UNIT/ML injection Inject 20 Units into the skin daily.    ? latanoprost (XALATAN) 0.005 % ophthalmic solution Place 1 drop into both eyes daily.    ? lidocaine (LIDODERM) 5 % Place 2 patches onto the skin See admin instructions. Apply 1 patch to each foot every 12 hours    ? lidocaine-prilocaine (EMLA) cream Apply to affected area once 30 g 3  ? Loperamide HCl (IMODIUM PO) SMARTSIG:1 Capsule(s) By Mouth 1-2 Times Daily PRN (Patient not taking: Reported on 08/30/2021)    ? LORazepam (ATIVAN) 1 MG tablet Take 1 mg by mouth 2 (two) times daily.    ? LYRICA 100 MG capsule Take 100 mg by mouth 2 (two) times daily. (Patient not taking: Reported on 08/30/2021)    ? MEDROL 4 MG TBPK  tablet Take 4 mg by mouth as directed.    ? NARCAN 4 MG/0.1ML LIQD nasal spray kit 1 spray once.    ? osimertinib mesylate (TAGRISSO) 40 MG tablet Take 1 tablet (40 mg total) by mouth daily. Do not start un

## 2021-09-07 NOTE — Assessment & Plan Note (Signed)
?   Avoid nephrotoxins as able ?? Monitor BMP ?

## 2021-09-07 NOTE — ED Provider Notes (Signed)
? ?[facility] ?Provider Note ? ? ? None  ?  (approximate) ? ? ?History  ? ?Weakness ? ? ?HPI ? ?Natalie [name] is a 61 y.o. female with history of lung cancer, diabetes, hypertension, CVA presents emergency department with weakness of the right hand and right leg.  Last known well was Friday afternoon.  At first they thought it was just the foot and now the whole leg she is having to drag when walking.  States she is having a little bit of pain.  Patient is currently undergoing chemotherapy due to lung cancer with mets. ? ?  ? ? ?Physical Exam  ? ?Triage Vital Signs: ?ED Triage Vitals  ?Enc Vitals Group  ?   BP 09/07/21 1133 128/71  ?   Pulse Rate 09/07/21 1133 88  ?   Resp 09/07/21 1133 18  ?   Temp 09/07/21 1133 99.7 ?F (37.6 ?C)  ?   Temp Source 09/07/21 1133 Oral  ?   SpO2 09/07/21 1133 97 %  ?   Weight 09/07/21 1130 211 lb 6.4 oz (95.9 kg)  ?   Height 09/07/21 1130 _0  (1.575 m)  ?   Head Circumference --   ?   Peak Flow --   ?   Pain Score 09/07/21 1129 7  ?   Pain Loc --   ?   Pain Edu? --   ?   Excl. in [place]? --   ? ? ?Most recent vital signs: ?Vitals:  ? 09/07/21 1133 09/07/21 1200  ?BP: 128/71 127/71  ?Pulse: 88 85  ?Resp: 18   ?Temp: 99.7 ?F (37.6 ?C)   ?SpO2: 97% 100%  ? ? ? ?General: Awake, no distress.   ?CV:  Good peripheral perfusion. regular rate and  rhythm ?Resp:  Normal effort.  ?Abd:  No distention.   ?Other:  Complete weakness of the right leg, decreased strength in the right hand, vascular intact distally ? ? ?ED Results / Procedures / Treatments  ? ?Labs ?(all labs ordered are listed, but only abnormal results are displayed) ?Labs Reviewed  ?APTT - Abnormal; Notable for the following components:  ?    Result Value  ? aPTT 40 (*)   ? All other components within normal limits  ?CBC - Abnormal; Notable for the following components:  ? RBC 3.09 (*)   ? Hemoglobin 8.5 (*)   ? HCT 27.7 (*)   ? RDW 16.7 (*)   ? All other components within normal limits  ?COMPREHENSIVE  METABOLIC PANEL - Abnormal; Notable for the following components:  ? Potassium 3.4 (*)   ? Glucose, Bld 128 (*)   ? Creatinine, Ser 1.24 (*)   ? Calcium 8.8 (*)   ? AST 49 (*)   ? Alkaline Phosphatase 165 (*)   ? GFR, Estimated 49 (*)   ? All other components within normal limits  ?RESP PANEL BY RT-PCR (FLU A&B, COVID) [id number]  ?ETHANOL  ?PROTIME-INR  ?DIFFERENTIAL  ?URINE DRUG SCREEN, QUALITATIVE ([facility] ONLY)  ?URINALYSIS, ROUTINE W REFLEX MICROSCOPIC  ? ? ? ?EKG ? ?EKG ? ? ?RADIOLOGY ?CT of the head ? ? ? ?PROCEDURES: ? ? ?Procedures ? ? ?MEDICATIONS ORDERED IN ED: ?Medications  ?morphine (PF) 4 MG/ML injection 4 mg (4 mg Intravenous Given 09/07/21 1219)  ?ondansetron [facility]) injection 4 mg (4 mg Intravenous Given 09/07/21 1219)  ?dexamethasone (DECADRON) injection 10 mg (10 mg Intravenous Given 09/07/21 1219)  ? ? ? ?IMPRESSION / MDM / ASSESSMENT AND  PLAN / ED COURSE  ?I reviewed the triage vital signs and the nursing notes. ?             ?               ? ?Differential diagnosis includes, but is not limited to, CVA, brain tumor, mets to the spine ? ?CT of the head was independently reviewed by me.   ? ?Spoke with radiologist concerning head CT, states new brain mets with some hemorrhagic tumors. ? ?Consult to oncology, Dr. Janese Banks would like for me to send secure chat for her and Dr. Grayland Ormond to discuss with the oncology radiologist.  Start with Decadron 10 mg IV, then progress to Decadron 4 mg twice daily with admission. will also consult with palliative care ? ?CBC has decreased hemoglobin of 8.5, hematocrit is decreased to 27.7. ?Comprehensive metabolic panel shows glucose of 128, creatinine of 1.24 which is elevated, potassium is mildly decreased at 3.4, alk phosphatase has increased to 165.  PT has a normal INR of 1.1, PTT is elevated at 40 ? ?Dr. Charna Archer in to see the patient. ? ?Consult to hospitalist for admission.  Spoke with [place].  Patient is in stable condition at time of admission. ? ? ?  ? ? ?FINAL  CLINICAL IMPRESSION(S) / ED DIAGNOSES  ? ?Final diagnoses:  ?Brain metastases ([place])  ?Weakness  ? ? ? ?Rx / DC Orders  ? ?ED Discharge Orders   ? ? None  ? ?  ? ? ? ?Note:  This document was prepared using Dragon voice recognition software and may include unintentional dictation errors. ? ?  ?Versie Starks, PA-C ?09/07/21 1242 ? ?  ?Blake Divine, MD ?09/08/21 1520 ? ?

## 2021-09-07 NOTE — Consult Note (Addendum)
?[place]  ?[phone] [phone] ? ?IDKarma Greaser OB: 1960-12-27  MR#: [id number]  ZWC#:[id number] ? ?Patient Care Team: ?Center, [facility] as PCP - General ?Telford Nab, RN as Oncology Nurse Navigator ?Lloyd Huger, MD as Consulting Physician (Hematology and Oncology) ? ?CHIEF COMPLAINT: Recurrent lung cancer, now with new brain metastasis. ? ?INTERVAL HISTORY: Patient is a 60 year old female with recurrent progressive stage IV lung cancer who is currently receiving treatment with immunotherapy, Tecentriq.  Over the past several weeks her performance status has declined, she has had difficulty walking, and some confusion.  Subsequent evaluation to the emergency room revealed innumerable brain metastasis.  Patient was initiated on IV steroids and feels significantly improved and back to her baseline.  Currently, she has no neurologic complaints.  She denies any fevers.  She has a good appetite and denies weight loss.  She has no chest pain, shortness of breath, cough, or hemoptysis.  She denies any nausea, vomiting, constipation, or diarrhea.  She has no urinary complaints.  Patient offers no further specific complaints today. ? ?REVIEW OF SYSTEMS:   ?Review of Systems  ?Constitutional:  Positive for malaise/fatigue. Negative for fever and weight loss.  ?HENT: Negative.    ?Respiratory: Negative.  Negative for cough, hemoptysis and shortness of breath.   ?Cardiovascular: Negative.  Negative for chest pain and leg swelling.  ?Gastrointestinal: Negative.  Negative for abdominal pain.  ?Genitourinary: Negative.  Negative for dysuria.  ?Musculoskeletal: Negative.  Negative for back pain.  ?Skin: Negative.  Negative for itching and rash.  ?Neurological:  Positive for weakness. Negative for dizziness, speech change, focal weakness and headaches.  ?Psychiatric/Behavioral: Negative.  The patient is not nervous/anxious.   ? ?As per HPI.  Otherwise, a complete review of systems is negative. ? ?PAST MEDICAL HISTORY: ?Past Medical History:  ?Diagnosis Date  ? Asthma   ? COPD (chronic obstructive pulmonary disease) ([place])   ? CVA (cerebral vascular accident) [facility]) 2014  ? Depression   ? Diabetes mellitus ([place])   ? Diabetes mellitus without complication ([place])   ? Hepatitis C   ? History of kidney stones   ? Hyperlipidemia   ? Iron deficiency anemia   ? Obesity   ? OSA on CPAP   ? Mild, noncompliant with CPAP said cpap machine was recalled, is waiting for a replacement   ? Paranoia ([place])   ? Schizoaffective disorder, bipolar type ([place])   ? ? ?PAST SURGICAL HISTORY: ?Past Surgical History:  ?Procedure Laterality Date  ? BREAST CYST EXCISION Bilateral 2013  ? Benign  ? CESAREAN SECTION    ? CHOLECYSTECTOMY    ? COLONOSCOPY    ? ESOPHAGOGASTRODUODENOSCOPY  2020  ? done in [place]  ? INGUINAL HERNIA REPAIR Left 2002  ? INTERCOSTAL NERVE BLOCK N/A 07/12/2020  ? Procedure: INTERCOSTAL NERVE BLOCK;  Surgeon: Melrose Nakayama, MD;  Location: [place];  Service: Thoracic;  Laterality: N/A;  ? IR IMAGING GUIDED PORT INSERTION  08/20/2020  ? LYMPH NODE DISSECTION Left 07/12/2020  ? Procedure: LYMPH NODE DISSECTION;  Surgeon: Melrose Nakayama, MD;  Location: [place];  Service: Thoracic;  Laterality: Left;  ? UMBILICAL HERNIA REPAIR    ? VIDEO BRONCHOSCOPY WITH ENDOBRONCHIAL NAVIGATION N/A 06/07/2020  ? Procedure: VIDEO BRONCHOSCOPY WITH ENDOBRONCHIAL NAVIGATION;  Surgeon: Tyler Pita, MD;  Location: [facility];  Service: Pulmonary;  Laterality: N/A;  ? ? ?FAMILY HISTORY: ?History reviewed. No pertinent family history. ? ?ADVANCED DIRECTIVES (Y/N):  @ADVDIR @ ? ?HEALTH  MAINTENANCE: ?Social History  ? ?Tobacco Use  ? Smoking status: Former  ?  Packs/day: 2.00  ?  Years: 38.00  ?  Pack years: 76.00  ?  Types: Cigarettes  ?  Quit date: 2012  ?  Years since quitting: 11.2  ? Smokeless tobacco: Former  ?Vaping Use  ? Vaping Use: Never used  ?Substance Use Topics  ? Alcohol  use: Yes  ?  Comment: occassionaly  ? Drug use: Not Currently  ? ? ? Colonoscopy: ? PAP: ? Bone density: ? Lipid panel: ? ?No Known Allergies ? ?Current Facility-Administered Medications  ?Medication Dose Route Frequency Provider Last Rate Last Admin  ? albuterol (PROVENTIL) (2.5 MG/3ML) 0.083% nebulizer solution 3 mL  3 mL Nebulization Q6H PRN Emeterio Reeve, DO      ? atorvastatin (LIPITOR) tablet 40 mg  40 mg Oral QHS Emeterio Reeve, DO      ? benztropine (COGENTIN) tablet 1 mg  1 mg Oral QHS Emeterio Reeve, DO      ? Chlorhexidine Gluconate Cloth 2 % PADS 6 each  6 each Topical Daily Lloyd Huger, MD      ? citalopram (CELEXA) tablet 20 mg  20 mg Oral q morning Emeterio Reeve, DO   20 mg at 09/07/21 1653  ? [START ON 09/08/2021] dexamethasone (DECADRON) injection 4 mg  4 mg Intravenous Q12H Emeterio Reeve, DO      ? fluticasone furoate-vilanterol (BREO ELLIPTA) 100-25 MCG/ACT 1 puff  1 puff Inhalation Daily Emeterio Reeve, DO   1 puff at 09/07/21 1558  ? And  ? umeclidinium bromide (INCRUSE ELLIPTA) 62.5 MCG/ACT 1 puff  1 puff Inhalation Daily Emeterio Reeve, DO   1 puff at 09/07/21 1557  ? gabapentin (NEURONTIN) capsule 300 mg  300 mg Oral BID Emeterio Reeve, DO   300 mg at 09/07/21 1555  ? insulin aspart (novoLOG) injection 0-20 Units  0-20 Units Subcutaneous TID WC Emeterio Reeve, DO   3 Units at 09/07/21 1653  ? insulin detemir (LEVEMIR) injection 30 Units  30 Units Subcutaneous Daily Emeterio Reeve, DO   30 Units at 09/07/21 1802  ? latanoprost (XALATAN) 0.005 % ophthalmic solution 1 drop  1 drop Both Eyes Daily Emeterio Reeve, DO   1 drop at 09/07/21 1559  ? LORazepam (ATIVAN) tablet 1 mg  1 mg Oral BID Emeterio Reeve, DO      ? morphine (PF) 2 MG/ML injection 2 mg  2 mg Intravenous Q4H PRN Emeterio Reeve, DO      ? oxyCODONE-acetaminophen (PERCOCET/ROXICET) 5-325 MG per tablet 1 tablet  1 tablet Oral Q6H PRN Emeterio Reeve, DO   1 tablet at  09/07/21 1556  ? sodium chloride flush (NS) 0.9 % injection 10-40 mL  10-40 mL Intracatheter Q12H Shuan Statzer, Kathlene November, MD      ? sodium chloride flush (NS) 0.9 % injection 10-40 mL  10-40 mL Intracatheter PRN Lloyd Huger, MD      ? ziprasidone (GEODON) capsule 80 mg  80 mg Oral QHS Emeterio Reeve, DO      ? ? ?OBJECTIVE: ?Vitals:  ? 09/07/21 1230 09/07/21 1546  ?BP: (!) 131/57 129/62  ?Pulse: 83 90  ?Resp: 18 16  ?Temp:  99.7 ?F (37.6 ?C)  ?SpO2: 100% 94%  ?   Body mass index is 38.67 kg/m?Marland Kitchen    ECOG FS:0 - Asymptomatic ? ?General: Well-developed, well-nourished, no acute distress. ?Eyes: Pink conjunctiva, anicteric sclera. ?HEENT: Normocephalic, moist mucous membranes. ?Lungs: No audible wheezing or coughing. ?  Heart: Regular rate and rhythm. ?Abdomen: Soft, nontender, no obvious distention. ?Musculoskeletal: No edema, cyanosis, or clubbing. ?Neuro: Alert, answering all questions appropriately. Cranial nerves grossly intact. ?Skin: No rashes or petechiae noted. ?Psych: Normal affect. ?Lymphatics: No cervical, calvicular, axillary or inguinal LAD. ? ? ?LAB RESULTS: ? ?Lab Results  ?Component Value Date  ? NA 136 09/07/2021  ? K 3.4 (L) 09/07/2021  ? CL 102 09/07/2021  ? CO2 25 09/07/2021  ? GLUCOSE 128 (H) 09/07/2021  ? BUN 10 09/07/2021  ? CREATININE 1.24 (H) 09/07/2021  ? CALCIUM 8.8 (L) 09/07/2021  ? PROT 7.1 09/07/2021  ? ALBUMIN 3.6 09/07/2021  ? AST 49 (H) 09/07/2021  ? ALT 44 09/07/2021  ? ALKPHOS 165 (H) 09/07/2021  ? BILITOT 1.0 09/07/2021  ? GFRNONAA 49 (L) 09/07/2021  ? GFRAA >60 01/30/2020  ? ? ?Lab Results  ?Component Value Date  ? WBC 7.5 09/07/2021  ? NEUTROABS 5.7 09/07/2021  ? HGB 8.5 (L) 09/07/2021  ? HCT 27.7 (L) 09/07/2021  ? MCV 89.6 09/07/2021  ? PLT 225 09/07/2021  ? ? ? ?STUDIES: ?CT HEAD WO CONTRAST ? ?Result Date: 09/07/2021 ?CLINICAL DATA:  Neuro deficit, stroke suspected. History of lung cancer EXAM: CT HEAD WITHOUT CONTRAST TECHNIQUE: Contiguous axial images were obtained from  the base of the skull through the vertex without intravenous contrast. RADIATION DOSE REDUCTION: This exam was performed according to the departmental dose-optimization program which includes automated exposure c

## 2021-09-07 NOTE — Assessment & Plan Note (Signed)
?   Likely anemia chronic disease / chemotherapy effect ?? Monitor CBC, will obtain retic ct, Iron/Ferritin/TIBC, B12/Folate ?

## 2021-09-07 NOTE — ED Triage Notes (Signed)
Right sided weakness x 5 days. Last known well Friday 09/02/21. Pt unable to lift right leg can only drag leg to walk. Unable to write with right hand. Pt has stage 4 lung cancer with mets. Pt is a&o x 4 ?

## 2021-09-07 NOTE — Assessment & Plan Note (Signed)
?   Continue home Celexa, Ativan, Geodon ?

## 2021-09-07 NOTE — Assessment & Plan Note (Addendum)
?   CT Head 09/07/21: "1. Large solid and cystic metastatic lesion in the left parietal lobe measuring up to 4.8 cm with surrounding vasogenic edema and regional mass effect but no midline shift. 2. Additional 1.0 cm metastatic lesion in the right frontal lobe with suspected intralesional hemorrhage. Additional suspected metastases are seen in the right cerebellar hemisphere and periventricular white matter. 3. Recommend brain MRI with and without contrast." ?? Obtain MRI brain: pending as of admission ?? Decadron IV as recommended by oncology  ?? Place consult for oncology, radiation oncology, anticipate less likely need for neurosurgery but will await recs  ?? Place consult for palliative care: Brief discussion of GOC with patient, at this point, she is requesting resuscitative measures in the event of cardiopulmonary arrest including CPR, relation, mechanical ventilation.  Appreciate recommendations from oncology regarding prognosis ?? Holding ASA, Rx VTE Ppx d/t possible intralesional hemorrhage in brain mass  ? ?

## 2021-09-07 NOTE — Assessment & Plan Note (Signed)
?   Not in exacerbation ?? Continue home inhalers and albuterol/O2 prn ?

## 2021-09-07 NOTE — Assessment & Plan Note (Signed)
?   Continue home basal insulin and will add meal SSI coverage ?

## 2021-09-07 NOTE — ED Notes (Signed)
Pt transported to MRI 

## 2021-09-08 ENCOUNTER — Ambulatory Visit: Payer: Medicaid Other

## 2021-09-08 ENCOUNTER — Ambulatory Visit: Payer: Medicaid Other | Admitting: Radiation Oncology

## 2021-09-08 DIAGNOSIS — C3432 Malignant neoplasm of lower lobe, left bronchus or lung: Secondary | ICD-10-CM | POA: Insufficient documentation

## 2021-09-08 DIAGNOSIS — C7931 Secondary malignant neoplasm of brain: Secondary | ICD-10-CM | POA: Insufficient documentation

## 2021-09-08 DIAGNOSIS — Z510 Encounter for antineoplastic radiation therapy: Secondary | ICD-10-CM | POA: Insufficient documentation

## 2021-09-08 DIAGNOSIS — C3490 Malignant neoplasm of unspecified part of unspecified bronchus or lung: Secondary | ICD-10-CM | POA: Diagnosis not present

## 2021-09-08 LAB — BASIC METABOLIC PANEL
Anion gap: 8 (ref 5–15)
BUN: 13 mg/dL (ref 8–23)
CO2: 27 mmol/L (ref 22–32)
Calcium: 8.7 mg/dL — ABNORMAL LOW (ref 8.9–10.3)
Chloride: 103 mmol/L (ref 98–111)
Creatinine, Ser: 1.34 mg/dL — ABNORMAL HIGH (ref 0.44–1.00)
GFR, Estimated: 45 mL/min — ABNORMAL LOW (ref >60)
Glucose, Bld: 159 mg/dL — ABNORMAL HIGH (ref 70–99)
Potassium: 4.1 mmol/L (ref 3.5–5.1)
Sodium: 138 mmol/L (ref 135–145)

## 2021-09-08 LAB — IRON AND TIBC
Iron: 40 ug/dL (ref 28–170)
Saturation Ratios: 16 % (ref 10.4–31.8)
TIBC: 253 ug/dL (ref 250–450)
UIBC: 213 ug/dL

## 2021-09-08 LAB — GLUCOSE, CAPILLARY
Glucose-Capillary: 167 mg/dL — ABNORMAL HIGH (ref 70–99)
Glucose-Capillary: 166 mg/dL — ABNORMAL HIGH (ref 70–99)
Glucose-Capillary: 216 mg/dL — ABNORMAL HIGH (ref 70–99)
Glucose-Capillary: 163 mg/dL — ABNORMAL HIGH (ref 70–99)
Glucose-Capillary: 185 mg/dL — ABNORMAL HIGH (ref 70–99)
Glucose-Capillary: 187 mg/dL — ABNORMAL HIGH (ref 70–99)

## 2021-09-08 LAB — CBC
HCT: 24.7 % — ABNORMAL LOW (ref 36.0–46.0)
Hemoglobin: 7.9 g/dL — ABNORMAL LOW (ref 12.0–15.0)
MCH: 28.2 pg (ref 26.0–34.0)
MCHC: 32 g/dL (ref 30.0–36.0)
MCV: 88.2 fL (ref 80.0–100.0)
Platelets: 240 10*3/uL (ref 150–400)
RBC: 2.8 MIL/uL — ABNORMAL LOW (ref 3.87–5.11)
RDW: 16.1 % — ABNORMAL HIGH (ref 11.5–15.5)
WBC: 6.1 10*3/uL (ref 4.0–10.5)
nRBC: 0 % (ref 0.0–0.2)

## 2021-09-08 LAB — RETICULOCYTES
Immature Retic Fract: 24.6 % — ABNORMAL HIGH (ref 2.3–15.9)
RBC.: 2.87 MIL/uL — ABNORMAL LOW (ref 3.87–5.11)
Retic Count, Absolute: 70.3 10*3/uL (ref 19.0–186.0)
Retic Ct Pct: 2.5 % (ref 0.4–3.1)

## 2021-09-08 LAB — HEMOGLOBIN A1C
Hgb A1c MFr Bld: 6.1 % — ABNORMAL HIGH (ref 4.8–5.6)
Mean Plasma Glucose: 128 mg/dL

## 2021-09-08 LAB — HIV ANTIBODY (ROUTINE TESTING W REFLEX): HIV Screen 4th Generation wRfx: NONREACTIVE

## 2021-09-08 LAB — FERRITIN: Ferritin: 354 ng/mL — ABNORMAL HIGH (ref 11–307)

## 2021-09-08 NOTE — Evaluation (Signed)
Occupational Therapy Evaluation ?Patient Details ?Name: Natalie [name] ?MRN: [id number] ?DOB: [birth year] ?Today's Date: 09/08/2021 ? ? ?History of Present Illness Pt is a 61 y.o. female presenting to hospital 3/22 with weakness of the R hand and R leg (last know well was Friday afternoon); pt having to drag leg when walking; currently undergoing chemotherapy d/t metastatic lung CA.  MRI of brain showing "1. At least 10 intraparenchymal metastatic lesions described in detail above, the largest dominant solid and cystic lesion centered in the left parietal lobe measuring up to 4.7 cm with perilesional  edema and regional mass effect resulting in trace rightward midline  shift.  2. 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  3. No evidence of associated hemorrhage, as was suspected on the prior CT".  Pt admitted with metastatic lung CA, brain mets, and possible spine mets resulting in ambulatory dysfunction; also hypokalemia, CKD, and normocytic anemia.  PMH includes lung CA, DM, htn, CVA, COPD, Hep C, OSA on CPAP, and schizoaffective disorder.  ? ?Clinical Impression ?  ?Patient presenting with decreased Ind in self care,balance, functional mobility/transfers, endurance, and safety awareness. Patient reports living with son in 2nd story apartment. She has two flights of stairs to return to room. This session pt ambulates and transfers with CGA- min A with use of RW. She needs max multimodal cuing to remain within bari walker for safety. Pt does have decreased coordination and strength in R UE but it is overall functional. She had difficulty releasing RW after returning to recliner chair. HR increased to 125 bpm with 15' of ambulation. PT has all needed equipment at home. She would need some assistance during the day when son is working but pt reports having that assist. Patient will benefit from acute OT to increase overall independence in the areas of ADLs, functional mobility,and safety awareness  in  order to safely discharge home with family. She will likely need EMS transport for flight of steps to front door.   ?   ? ?Recommendations for follow up therapy are one component of a multi-disciplinary discharge planning process, led by the attending physician.  Recommendations may be updated based on patient status, additional functional criteria and insurance authorization.  ? ?Follow Up Recommendations ? Home health OT  ?  ?Assistance Recommended at Discharge Intermittent Supervision/Assistance  ?Patient can return home with the following A little help with walking and/or transfers;A little help with bathing/dressing/bathroom;Help with stairs or ramp for entrance;Assistance with cooking/housework;Assist for transportation ? ?  ?Functional Status Assessment ? Patient has had a recent decline in their functional status and demonstrates the ability to make significant improvements in function in a reasonable and predictable amount of time.  ?Equipment Recommendations ? None recommended by OT  ?  ?   ?Precautions / Restrictions Precautions ?Precautions: Fall ?Precaution Comments: Bone metastases ?Restrictions ?Weight Bearing Restrictions: No  ? ?  ? ?Mobility Bed Mobility ?  ?  ?  ?  ?  ?  ?  ?General bed mobility comments: seated in recliner chair ?  ? ?Transfers ?Overall transfer level: Needs assistance ?Equipment used: Rolling walker (2 wheels) ?Transfers: Sit to/from Stand ?Sit to Stand: Min guard, Min assist ?  ?  ?  ?  ?  ?General transfer comment: constant cuing to remain within bari walker ?  ? ?  ?Balance Overall balance assessment: Needs assistance ?Sitting-balance support: No upper extremity supported, Feet supported ?Sitting balance-Leahy Scale: Good ?Sitting balance - Comments: steady sitting reaching within BOS ?  ?Standing  balance support: Single extremity supported ?Standing balance-Leahy Scale: Fair ?Standing balance comment: steady standing with at least single UE support on RW ?  ?  ?  ?  ?  ?  ?   ?  ?  ?  ?  ?   ? ?ADL either performed or assessed with clinical judgement  ? ?ADL Overall ADL's : Needs assistance/impaired ?  ?  ?  ?  ?  ?  ?  ?  ?  ?  ?  ?  ?  ?  ?  ?  ?  ?  ?  ?General ADL Comments: CGA - min A for functional mobility with RW. Pt fatigues quickly and likely needs min A for LB dressing. Set up A for UB self care.  ? ? ? ?Vision Patient Visual Report: No change from baseline ?   ?   ?   ?   ? ?Pertinent Vitals/Pain Pain Assessment ?Pain Assessment: No/denies pain  ? ? ? ?Hand Dominance Right ?  ?Extremity/Trunk Assessment Upper Extremity Assessment ?Upper Extremity Assessment: RUE deficits/detail ?RUE Deficits / Details: decreased dexterity, speed, and strength in R UE. ?  ?Lower Extremity Assessment ?Lower Extremity Assessment: Defer to PT evaluation ?RLE Deficits / Details: hip flexion 3+/5; knee flexion/extension 4/5; DF 3+/5 ?  ?  ?  ?Communication Communication ?Communication: No difficulties ?  ?Cognition Arousal/Alertness: Awake/alert ?Behavior During Therapy: [facility] for tasks assessed/performed ?Overall Cognitive Status: Within Functional Limits for tasks assessed ?  ?  ?  ?  ?  ?  ?  ?  ?  ?  ?  ?  ?  ?  ?  ?  ?  ?  ?  ?   ?   ?   ? ? ?Home Living Family/patient expects to be discharged to:: Private residence ?Living Arrangements: Children (son) ?Available Help at Discharge: Family;Available PRN/intermittently;Friend(s) ?Type of Home: House ?Home Access: Stairs to enter ?Entrance Stairs-Number of Steps: 2 short flights of stairs ?Entrance Stairs-Rails: Right;Left;Can reach both ?Home Layout: Two level;Able to live on main level with bedroom/bathroom ?  ?  ?Bathroom Shower/Tub: Tub/shower unit ?  ?Bathroom Toilet: Standard ?  ?  ?Home Equipment: Grab bars - tub/shower;Shower Land (2 wheels);Cane - single point ?  ?  ?  ? ?  ?Prior Functioning/Environment Prior Level of Function : Needs assist ?  ?  ?  ?Physical Assist : Mobility (physical);ADLs (physical) ?Mobility  (physical): Bed mobility;Transfers;Gait ?ADLs (physical): Bathing ?Mobility Comments: Pt ambulatory with SPC at home (houseshold distances).  Pt reports normally needing assist to get OOB and intermittently needing assist for ambulation within home (depending on how she is feeling). ?ADLs Comments: Assist for showers as needed. She is able to perform dressing on her own. Son performs IADLs ?  ? ?  ?  ?OT Problem List: Decreased strength;Decreased activity tolerance;Impaired balance (sitting and/or standing);Decreased coordination;Decreased safety awareness;Decreased knowledge of use of DME or AE;Decreased knowledge of precautions;Impaired UE functional use ?  ?   ?OT Treatment/Interventions: Self-care/ADL training;Therapeutic exercise;Therapeutic activities;Energy conservation;DME and/or AE instruction;Patient/family education;Manual therapy;Balance training  ?  ?OT Goals(Current goals can be found in the care plan section) Acute Rehab OT Goals ?Patient Stated Goal: to go home ?OT Goal Formulation: With patient ?Time For Goal Achievement: 09/22/21 ?Potential to Achieve Goals: Fair ?ADL Goals ?Pt Will Perform Grooming: with supervision ?Pt Will Perform Lower Body Dressing: with supervision;sit to/from stand ?Pt Will Transfer to Toilet: with supervision;ambulating ?Pt Will Perform Toileting - Clothing Manipulation and  hygiene: with supervision;sit to/from stand  ?OT Frequency: Min 2X/week ?  ? ?   ?AM-PAC OT "6 Clicks" Daily Activity     ?Outcome Measure Help from another person eating meals?: None ?Help from another person taking care of personal grooming?: A Little ?Help from another person toileting, which includes using toliet, bedpan, or urinal?: A Little ?Help from another person bathing (including washing, rinsing, drying)?: A Little ?Help from another person to put on and taking off regular upper body clothing?: A Little ?Help from another person to put on and taking off regular lower body clothing?: A  Little ?6 Click Score: 19 ?  ?End of Session Equipment Utilized During Treatment: Rolling walker (2 wheels) ?Nurse Communication: Mobility status ? ?Activity Tolerance: Patient tolerated treatment well ?Patient left: i

## 2021-09-08 NOTE — Evaluation (Signed)
Physical Therapy Evaluation ?Patient Details ?Name: Natalie [name] ?MRN: [id number] ?DOB: 1960/10/04 ?Today's Date: 09/08/2021 ? ?History of Present Illness ? Pt is a 61 y.o. female presenting to hospital 3/22 with weakness of the R hand and R leg (last know well was Friday afternoon); pt having to drag leg when walking; currently undergoing chemotherapy d/t metastatic lung CA.  MRI of brain showing "1. At least 10 intraparenchymal metastatic lesions described in detail above, the largest dominant solid and cystic lesion centered in the left parietal lobe measuring up to 4.7 cm with perilesional  edema and regional mass effect resulting in trace rightward midline  shift.  2. 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  3. No evidence of associated hemorrhage, as was suspected on the prior CT".  Pt admitted with metastatic lung CA, brain mets, and possible spine mets resulting in ambulatory dysfunction; also hypokalemia, CKD, and normocytic anemia.  PMH includes lung CA, DM, htn, CVA, COPD, Hep C, OSA on CPAP, and schizoaffective disorder.  ?Clinical Impression ? Prior to hospital admission, pt was ambulatory with [facility] (pt requiring assist to get OOB and intermittently required assist for household ambulation); lives with her son on main level of home with 2 short flights of stairs to enter with B railings.  Currently pt is CGA to min assist with transfers and CGA to min assist to ambulate 15 feet with RW.  Pt appearing to fatigue with ambulation (although pt reporting she did not feel fatigued) demonstrating impaired gait mechanics (see below for details) and requiring vc's to stay within RW.  R UE and R LE weakness noted.  No c/o pain during session.  Pt would benefit from skilled PT to address noted impairments and functional limitations (see below for any additional details).  Upon hospital discharge, pt would benefit from [place] and assist from family.  Anticipate pt will require EMS transport home d/t 2  short flight of steps to enter home.   ? ?Recommendations for follow up therapy are one component of a multi-disciplinary discharge planning process, led by the attending physician.  Recommendations may be updated based on patient status, additional functional criteria and insurance authorization. ? ?Follow Up Recommendations Home health PT ? ?  ?Assistance Recommended at Discharge Intermittent Supervision/Assistance  ?Patient can return home with the following ? A little help with walking and/or transfers;A little help with bathing/dressing/bathroom;Assistance with cooking/housework;Help with stairs or ramp for entrance;Assist for transportation ? ?  ?Equipment Recommendations Rolling walker (2 wheels);BSC/3in1;Wheelchair (measurements PT);Wheelchair cushion (measurements PT) (bariatric (pt needs wider walker) and youth sized)  ?Recommendations for Other Services ? OT consult  ?  ?Functional Status Assessment Patient has had a recent decline in their functional status and demonstrates the ability to make significant improvements in function in a reasonable and predictable amount of time.  ? ?  ?Precautions / Restrictions Precautions ?Precautions: Fall ?Precaution Comments: Bone metastases ?Restrictions ?Weight Bearing Restrictions: No  ? ?  ? ?Mobility ? Bed Mobility ?  ?  ?  ?  ?  ?  ?  ?General bed mobility comments: Deferred (pt sitting on edge of bed upon PT arrival) ?  ? ?Transfers ?Overall transfer level: Needs assistance ?Equipment used: Rolling walker (2 wheels) ?Transfers: Sit to/from Stand ?Sit to Stand: Min guard, Min assist ?  ?  ?  ?  ?  ?General transfer comment: increased effort to stand up to walker; min assist for safety to sit in recliner (assist to shift pt's hips d/t pt sitting before  being lined up to recliner) ?  ? ?Ambulation/Gait ?Ambulation/Gait assistance: Min guard, Min assist ?Gait Distance (Feet): 15 Feet ?Assistive device: Rolling walker (2 wheels) ?  ?Gait velocity: decreased ?  ?   ?General Gait Details: vc's to keep B LE's within RW; decreased stance time R LE; increased effort to advance R LE; decreased R LE step length and foot clearance ? ?Stairs ?  ?  ?  ?  ?  ? ?Wheelchair Mobility ?  ? ?Modified Rankin (Stroke Patients Only) ?  ? ?  ? ?Balance Overall balance assessment: Needs assistance ?Sitting-balance support: No upper extremity supported, Feet supported ?Sitting balance-Leahy Scale: Good ?Sitting balance - Comments: steady sitting reaching within BOS ?  ?Standing balance support: Single extremity supported ?Standing balance-Leahy Scale: Fair ?Standing balance comment: steady standing with at least single UE support on RW ?  ?  ?  ?  ?  ?  ?  ?  ?  ?  ?  ?   ? ? ? ?Pertinent Vitals/Pain Pain Assessment ?Pain Assessment: No/denies pain ?Vitals (HR and O2 on room air) stable and WFL throughout treatment session.  ? ? ?Home Living Family/patient expects to be discharged to:: Private residence ?Living Arrangements: Children (pt's son) ?Available Help at Discharge: Family;Available PRN/intermittently ?Type of Home: House ?Home Access: Stairs to enter ?Entrance Stairs-Rails: Right;Left;Can reach both ?Entrance Stairs-Number of Steps: 2 short flights of stairs ?  ?Home Layout: Two level;Able to live on main level with bedroom/bathroom ?Home Equipment: Grab bars - tub/shower;Shower Land (2 wheels);Cane - single point ?   ?  ?Prior Function Prior Level of Function : Needs assist ?  ?  ?  ?Physical Assist : Mobility (physical);ADLs (physical) ?Mobility (physical): Bed mobility;Transfers;Gait ?ADLs (physical): Bathing ?Mobility Comments: Pt ambulatory with SPC at home (houseshold distances).  Pt reports normally needing assist to get OOB and intermittently needing assist for ambulation within home (depending on how she is feeling). ?ADLs Comments: Assist for showers. ?  ? ? ?Hand Dominance  ? Dominant Hand: Right ? ?  ?Extremity/Trunk Assessment  ? Upper Extremity  Assessment ?Upper Extremity Assessment: RUE deficits/detail;Defer to OT evaluation (decreased R hand grip strength compared to L; at least 3+/5 R elbow flexion/extension and shoulder flexion) ?  ? ?Lower Extremity Assessment ?Lower Extremity Assessment: RLE deficits/detail (decreased B LE light touch dorsal/plantar surface of feet (pt reports baseline last 2-3 years)) ?RLE Deficits / Details: hip flexion 3+/5; knee flexion/extension 4/5; DF 3+/5 ?  ? ?   ?Communication  ? Communication: No difficulties  ?Cognition Arousal/Alertness: Awake/alert ?Behavior During Therapy: [facility] for tasks assessed/performed ?Overall Cognitive Status: Within Functional Limits for tasks assessed ?  ?  ?  ?  ?  ?  ?  ?  ?  ?  ?  ?  ?  ?  ?  ?  ?  ?  ?  ? ?  ?General Comments  Pt agreeable to PT session. ? ?  ?Exercises  Gait training  ? ?Assessment/Plan  ?  ?PT Assessment Patient needs continued PT services  ?PT Problem List Decreased strength;Decreased activity tolerance;Decreased balance;Decreased mobility;Decreased knowledge of use of DME;Decreased knowledge of precautions ? ?   ?  ?PT Treatment Interventions DME instruction;Gait training;Stair training;Functional mobility training;Therapeutic activities;Therapeutic exercise;Balance training;Patient/family education   ? ?PT Goals (Current goals can be found in the Care Plan section)  ?Acute Rehab PT Goals ?Patient Stated Goal: to improve strength and mobility ?PT Goal Formulation: With patient ?Time For Goal Achievement: 09/22/21 ?  Potential to Achieve Goals: Good ? ?  ?Frequency Min 2X/week ?  ? ? ?Co-evaluation   ?  ?  ?  ?  ? ? ?  ?AM-PAC PT "6 Clicks" Mobility  ?Outcome Measure Help needed turning from your back to your side while in a flat bed without using bedrails?: None ?Help needed moving from lying on your back to sitting on the side of a flat bed without using bedrails?: A Little ?Help needed moving to and from a bed to a chair (including a wheelchair)?: A Little ?Help needed  standing up from a chair using your arms (e.g., wheelchair or bedside chair)?: A Little ?Help needed to walk in hospital room?: A Little ?Help needed climbing 3-5 steps with a railing? : A Lot ?6 Click Score: 18 ? ?

## 2021-09-08 NOTE — Consult Note (Signed)
?NEW PATIENT EVALUATION ? ?Name: Natalie [name]  ?MRN: [id number]  ?Date:   09/07/2021     ?DOB: 06-12-1961 ? ? ?This 61 y.o. female patient presents t seen in her hospital room for evaluation of brain metastasis and patient with known stage IV lung cancer ?REFERRING PHYSICIAN: No ref. provider found ? ?CHIEF COMPLAINT:  ?Chief Complaint  ?Patient presents with  ? Weakness  ? ? ?DIAGNOSIS: The primary encounter diagnosis was Brain metastases ([place]). A diagnosis of Weakness was also pertinent to this visit. ?  ?PREVIOUS INVESTIGATIONS:  ?MRI scan PET scan and CT scans reviewed ?Pathology reports reviewed ?Clinical notes reviewed ? ?HPI: Patient is a 61 year old female under the care of of medical oncology who is been treated for stage IV lung cancer with liver and now brain metastasis.  She has been on Tecentriq.  Her performance status have started to decline.  Recently admitted for some confusion was noted on MRI scan of her brain to have at least 10 intraparenchymal metastatic lesions in the brain with a large solid and cystic lesion in the left parietal lobe measuring 4.7 cm.  I been asked to evaluate her for possible radiation therapy to her brain.  Seen in her hospital room she states she is less confused having no focal neurologic deficits no change in visual fields and having no headaches. ? ?PLANNED TREATMENT REGIMEN: Whole brain radiation plus boost ? ?PAST MEDICAL HISTORY:  has a past medical history of Asthma, COPD (chronic obstructive pulmonary disease) ([place]), CVA (cerebral vascular accident) ([place]) (2014), Depression, Diabetes mellitus ([place]), Diabetes mellitus without complication ([place]), Hepatitis C, History of kidney stones, Hyperlipidemia, Iron deficiency anemia, Obesity, OSA on CPAP, Paranoia ([place]), and Schizoaffective disorder, bipolar type ([place]).   ? ?PAST SURGICAL HISTORY:  ?Past Surgical History:  ?Procedure Laterality Date  ? BREAST CYST EXCISION Bilateral 2013  ? Benign  ? CESAREAN SECTION     ? CHOLECYSTECTOMY    ? COLONOSCOPY    ? ESOPHAGOGASTRODUODENOSCOPY  2020  ? done in [place]  ? INGUINAL HERNIA REPAIR Left 2002  ? INTERCOSTAL NERVE BLOCK N/A 07/12/2020  ? Procedure: INTERCOSTAL NERVE BLOCK;  Surgeon: Melrose Nakayama, MD;  Location: [facility];  Service: Thoracic;  Laterality: N/A;  ? IR IMAGING GUIDED PORT INSERTION  08/20/2020  ? LYMPH NODE DISSECTION Left 07/12/2020  ? Procedure: LYMPH NODE DISSECTION;  Surgeon: Melrose Nakayama, MD;  Location: [place];  Service: Thoracic;  Laterality: Left;  ? UMBILICAL HERNIA REPAIR    ? VIDEO BRONCHOSCOPY WITH ENDOBRONCHIAL NAVIGATION N/A 06/07/2020  ? Procedure: VIDEO BRONCHOSCOPY WITH ENDOBRONCHIAL NAVIGATION;  Surgeon: Tyler Pita, MD;  Location: [facility];  Service: Pulmonary;  Laterality: N/A;  ? ? ?FAMILY HISTORY: family history is not on file. ? ?SOCIAL HISTORY:  reports that she quit smoking about 11 years ago. Her smoking use included cigarettes. She has a 76.00 pack-year smoking history. She has quit using smokeless tobacco. She reports current alcohol use. She reports that she does not currently use drugs. ? ?ALLERGIES: Patient has no known allergies. ? ?MEDICATIONS:  ?Current Facility-Administered Medications  ?Medication Dose Route Frequency Provider Last Rate Last Admin  ? albuterol (PROVENTIL) (2.5 MG/3ML) 0.083% nebulizer solution 3 mL  3 mL Nebulization Q6H PRN Emeterio Reeve, DO      ? atorvastatin (LIPITOR) tablet 40 mg  40 mg Oral QHS Emeterio Reeve, DO   40 mg at 09/07/21 2140  ? benztropine (COGENTIN) tablet 1 mg  1 mg Oral QHS Emeterio Reeve, DO  1 mg at 09/07/21 2142  ? Chlorhexidine Gluconate Cloth 2 % PADS 6 each  6 each Topical Daily Lloyd Huger, MD   6 each at 09/08/21 [phone]  ? citalopram (CELEXA) tablet 20 mg  20 mg Oral q morning Emeterio Reeve, DO   20 mg at 09/08/21 4627  ? dexamethasone (DECADRON) injection 4 mg  4 mg Intravenous Q12H Emeterio Reeve, DO   4 mg at 09/08/21 0350  ? fluticasone  furoate-vilanterol (BREO ELLIPTA) 100-25 MCG/ACT 1 puff  1 puff Inhalation Daily Emeterio Reeve, DO   1 puff at 09/08/21 0847  ? And  ? umeclidinium bromide (INCRUSE ELLIPTA) 62.5 MCG/ACT 1 puff  1 puff Inhalation Daily Emeterio Reeve, DO   1 puff at 09/08/21 0847  ? gabapentin (NEURONTIN) capsule 300 mg  300 mg Oral BID Emeterio Reeve, DO   300 mg at 09/08/21 0938  ? insulin aspart (novoLOG) injection 0-20 Units  0-20 Units Subcutaneous TID WC Emeterio Reeve, DO   4 Units at 09/08/21 1829  ? insulin detemir (LEVEMIR) injection 30 Units  30 Units Subcutaneous Daily Emeterio Reeve, DO   30 Units at 09/08/21 0845  ? latanoprost (XALATAN) 0.005 % ophthalmic solution 1 drop  1 drop Both Eyes Daily Emeterio Reeve, DO   1 drop at 09/08/21 0848  ? LORazepam (ATIVAN) tablet 1 mg  1 mg Oral BID Emeterio Reeve, DO   1 mg at 09/08/21 9371  ? morphine (PF) 2 MG/ML injection 2 mg  2 mg Intravenous Q4H PRN Emeterio Reeve, DO      ? oxyCODONE-acetaminophen (PERCOCET/ROXICET) 5-325 MG per tablet 1 tablet  1 tablet Oral Q6H PRN Emeterio Reeve, DO   1 tablet at 09/08/21 6967  ? sodium chloride flush (NS) 0.9 % injection 10-40 mL  10-40 mL Intracatheter Q12H Lloyd Huger, MD   10 mL at 09/07/21 2150  ? sodium chloride flush (NS) 0.9 % injection 10-40 mL  10-40 mL Intracatheter PRN Lloyd Huger, MD      ? ziprasidone (GEODON) capsule 80 mg  80 mg Oral QHS Emeterio Reeve, DO   80 mg at 09/07/21 2142  ? ? ?ECOG PERFORMANCE STATUS:  0 - Asymptomatic ? ?REVIEW OF SYSTEMS: ?Patient denies any weight loss, fatigue, weakness, fever, chills or night sweats. Patient denies any loss of vision, blurred vision. Patient denies any ringing  of the ears or hearing loss. No irregular heartbeat. Patient denies heart murmur or history of fainting. Patient denies any chest pain or pain radiating to her upper extremities. Patient denies any shortness of breath, difficulty breathing at night, cough or  hemoptysis. Patient denies any swelling in the lower legs. Patient denies any nausea vomiting, vomiting of blood, or coffee ground material in the vomitus. Patient denies any stomach pain. Patient states has had normal bowel movements no significant constipation or diarrhea. Patient denies any dysuria, hematuria or significant nocturia. Patient denies any problems walking, swelling in the joints or loss of balance. Patient denies any skin changes, loss of hair or loss of weight. Patient denies any excessive worrying or anxiety or significant depression. Patient denies any problems with insomnia. Patient denies excessive thirst, polyuria, polydipsia. Patient denies any swollen glands, patient denies easy bruising or easy bleeding. Patient denies any recent infections, allergies or URI. Patient "s visual fields have not changed significantly in recent time. ?  ?PHYSICAL EXAM: ?BP (!) 116/59 (BP Location: Left Arm)   Pulse 90   Temp 98.8 ?F (37.1 ?C)  Resp 18   Ht 5\' 2"  (1.575 m)   Wt 211 lb 6.4 oz (95.9 kg)   SpO2 98%   BMI 38.67 kg/m?  ?Crude visual fields within normal range no focal neurologic deficits were appreciated.  Well-developed well-nourished patient in NAD. HEENT reveals PERLA, EOMI, discs not visualized.  Oral cavity is clear. No oral mucosal lesions are identified. Neck is clear without evidence of cervical or supraclavicular adenopathy. Lungs are clear to A&P. Cardiac examination is essentially unremarkable with regular rate and rhythm without murmur rub or thrill. Abdomen is benign with no organomegaly or masses noted. Motor sensory and DTR levels are equal and symmetric in the upper and lower extremities. Cranial nerves II through XII are grossly intact. Proprioception is intact. No peripheral adenopathy or edema is identified. No motor or sensory levels are noted. Crude visual fields are within normal range. ? ?LABORATORY DATA: Labs and pathology reports reviewed ? ?  ?RADIOLOGY RESULTS:  Brain MRI PET/CT and CT scans reviewed compatible with above-stated findings ? ? ?IMPRESSION: Brain metastasis in 61 year old female with known history of lung cancer ? ?PLAN: This time elected ahead with whole brain r

## 2021-09-08 NOTE — Progress Notes (Signed)
[place] at [facility] ? ? ?PATIENT NAME: Natalie [name]   ? ?MR#:  [id number] ? ?DATE OF BIRTH:  11/30/1960 ? ?SUBJECTIVE:  ? ?patient sitting out in the chair. No family at bedside. Patient's daughter was on the phone. ?Came in with weakness more on the right lower extremity. Did work with physical therapy. ?Patient tells me she is feeling a bit better. ? ?VITALS:  ?Blood pressure (!) 116/59, pulse 90, temperature 98.8 ?F (37.1 ?C), resp. rate 18, height 5\' 2"  (1.575 m), weight 95.9 kg, SpO2 98 %. ? ?PHYSICAL EXAMINATION:  ? ?GENERAL:  61 y.o.-year-old patient lying in the bed with no acute distress. Obese ?LUNGS: Normal breath sounds bilaterally, no wheezing, rales, rhonchi.  ?CARDIOVASCULAR: S1, S2 normal. No murmurs, rubs, or gallops.  ?ABDOMEN: Soft, nontender, nondistended. Bowel sounds present.  ?EXTREMITIES: No  edema b/l.    ?NEUROLOGIC: nonfocal  patient is alert and awake ?SKIN: No obvious rash, lesion, or ulcer.  ? ?LABORATORY PANEL:  ?CBC ?Recent Labs  ?Lab 09/08/21 ?5170  ?WBC 6.1  ?HGB 7.9*  ?HCT 24.7*  ?PLT 240  ? ? ?Chemistries  ?Recent Labs  ?Lab 09/07/21 ?1138 09/08/21 ?0174  ?NA 136 138  ?K 3.4* 4.1  ?CL 102 103  ?CO2 25 27  ?GLUCOSE 128* 159*  ?BUN 10 13  ?CREATININE 1.24* 1.34*  ?CALCIUM 8.8* 8.7*  ?AST 49*  --   ?ALT 44  --   ?ALKPHOS 165*  --   ?BILITOT 1.0  --   ? ?Cardiac Enzymes ?No results for input(s): TROPONINI in the last 168 hours. ?RADIOLOGY:  ?CT HEAD WO CONTRAST ? ?Result Date: 09/07/2021 ?CLINICAL DATA:  Neuro deficit, stroke suspected. History of lung cancer EXAM: CT HEAD WITHOUT CONTRAST TECHNIQUE: Contiguous axial images were obtained from the base of the skull through the vertex without intravenous contrast. RADIATION DOSE REDUCTION: This exam was performed according to the departmental dose-optimization program which includes automated exposure control, adjustment of the mA and/or kV according to patient size and/or use of iterative  reconstruction technique. COMPARISON:  Brain MRI 08/25/2020 FINDINGS: Brain: There is a solid and cystic mass centered in the left parietal lobe measuring up to approximally 4.8 cm AP x 3.7 cm cc by 3.0 cm TV consistent with metastatic disease (3-17, 5-33). There is significant surrounding vasogenic edema resulting in regional sulcal effacement and partial effacement of the left lateral ventricle without midline shift. There is an additional focus of hyperdensity in the right anterior frontal lobe measuring 1.0 cm consistent with additional metastatic disease, possibly with intralesional hemorrhage. There is no significant mass effect caused by this lesion. Additional suspected metastatic lesions are seen in the right cerebellar hemisphere and right periventricular white matter (5-22). There is no evidence of acute territorial infarct. Vascular: No hyperdense vessel or unexpected calcification. Skull: Normal. Negative for fracture or focal lesion. Sinuses/Orbits: Imaged paranasal sinuses are clear. The imaged globes and orbits are unremarkable. Other: None. IMPRESSION: 1. Large solid and cystic metastatic lesion in the left parietal lobe measuring up to 4.8 cm with surrounding vasogenic edema and regional mass effect but no midline shift. 2. Additional 1.0 cm metastatic lesion in the right frontal lobe with suspected intralesional hemorrhage. Additional suspected metastases are seen in the right cerebellar hemisphere and periventricular white matter. 3. Recommend brain MRI with and without contrast. These results were called by telephone at the time of interpretation on 09/07/2021 at 12:12 pm to provider Ashok Cordia , who verbally acknowledged these  results. Electronically Signed   By: Valetta Mole M.D.   On: 09/07/2021 12:13  ? ?MR Brain W and Wo Contrast ? ?Result Date: 09/07/2021 ?CLINICAL DATA:  Brain metastases seen on CT, history of lung cancer EXAM: MRI HEAD WITHOUT AND WITH CONTRAST TECHNIQUE: Multiplanar,  multiecho pulse sequences of the brain and surrounding structures were obtained without and with intravenous contrast. CONTRAST:  4mL GADAVIST GADOBUTROL 1 MMOL/ML IV SOLN COMPARISON:  Same-day noncontrast head CT, MR head 10/25/2020 FINDINGS: Brain: Numerous metastatic lesions are seen: *The dominant solid and cystic lesion is centered in the left parietal lobe measuring up to 4.7 cm by 4.0 cm in maximum dimension (sagittal image 17-14). There is surrounding edema with regional mass effect resulting in sulcal effacement and partial effacement of the left lateral ventricle. *8 mm satellite lesion posterior and superior to the dominant left parietal lobe lesion (15-17). *8 mm lesion in the right paracentral lobule (15-19). *Approximally 3 mm lesion in the right centrum semiovale (15-17). *1.1 cm AP x 0.6 cm TV lesion in the right middle frontal gyrus anteriorly. This corresponds to the hyperdense lesion seen on the prior CT. There is no susceptibility artifact to suggest intralesional hemorrhage as was questioned on the prior CT. *4 mm lesion in the right superior frontal gyrus anteriorly (15-14). *5 mm lesion in the right frontal lobe more inferiorly (15-13). *Numerous lesions in the right cerebellar hemisphere measuring 1.0 cm x 0.6 cm (15-7), 1.3 cm x 0.9 cm (15-5), and 1.3 cm x 0.9 cm (15-4). *The pineal gland appears enlarged with peripheral enhancement measuring up to 1.1 cm AP x 1.1 cm TV, new compared to the prior MRI from 08/25/2020 suspicious for a pineal metastatic lesion. *Additional smaller metastatic lesions can not be entirely excluded given motion degraded images. There is no evidence of acute territorial infarct or hemorrhage. As above, there is no evidence of intralesional hemorrhage associated with above-described metastases as was suspected on the prior CT. Mass effect from the largest lesion result in trace rightward midline shift. The basal cisterns are patent. Sequela of mild chronic white  matter microangiopathy is again seen. Vascular: Normal flow voids. Skull and upper cervical spine: There is no definite suspicious osseous abnormality. Sinuses/Orbits: The paranasal sinuses are clear. Bilateral lens implants are in place. The globes and orbits are otherwise unremarkable. Other: None. IMPRESSION: 1. At least 10 intraparenchymal metastatic lesions described in detail above, the largest dominant solid and cystic lesion centered in the left parietal lobe measuring up to 4.7 cm with perilesional edema and regional mass effect resulting in trace rightward midline shift. 2. 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 3. No evidence of associated hemorrhage, as was suspected on the prior CT. Electronically Signed   By: Valetta Mole M.D.   On: 09/07/2021 14:25   ? ?Assessment and Plan ?Shuree Brossart is a 61 y.o. female with history of lung cancer, diabetes, hypertension, CVA presents emergency department with weakness of the right hand and right leg.  Last known well was Friday afternoon (5 days prior to admission).  At first she and family thought it was just the foot and now the whole leg she is having to drag when walking. Patient is currently undergoing chemotherapy due to lung cancer with mets ? ?Right upper and lower extremity weakness suspected due to metastatic brain lesions with largest 4.8 cm with surrounding vasogenic edema in the left parietal lobe ?-- MRI brain confirmed numerous metastatic lesions in the brain largest dominant lesion in  the left parietal lobe ?-- continue IV Decadron 4 mg BID ?-- patient seen by Dr. Grayland Ormond ?-- Dr. Baruch Gouty radiation oncology to see patient today. Patient will be scheduled to start radiation next week ?-- PT OT to see patient ? ?chronic kidney disease stage III a in the setting of diabetes ?-- creatinine stable ? ?chronic anemia-- normocytic ?-- likely anemia of chronic disease/chemotherapy effect ?-- hemoglobin stable at 7.9 ? ?type II diabetes,  controlled ?-- A1c 6.1 ?-- continue home meds ? ?Depression with anxiety ?history of schizoaffective disorder ?-- continue Celexa, Ativan, Geodon ? ? ?Procedures: ?Family communication : daughter on the phone ?Con

## 2021-09-09 ENCOUNTER — Telehealth: Payer: Self-pay | Admitting: *Deleted

## 2021-09-09 LAB — RESP PANEL BY RT-PCR (FLU A&B, COVID) ARPGX2
Influenza A by PCR: NEGATIVE
Influenza B by PCR: NEGATIVE
SARS Coronavirus 2 by RT PCR: NEGATIVE

## 2021-09-09 LAB — GLUCOSE, CAPILLARY: Glucose-Capillary: 141 mg/dL — ABNORMAL HIGH (ref 70–99)

## 2021-09-09 MED ORDER — INSULIN DETEMIR 100 UNIT/ML ~~LOC~~ SOLN: 35.0000 [IU] | Freq: Every day | SUBCUTANEOUS | 11 refills | Status: AC

## 2021-09-09 MED ORDER — HEPARIN SOD (PORK) LOCK FLUSH 100 UNIT/ML IV SOLN
500.0000 [IU] | Freq: Once | INTRAVENOUS | Status: AC
  Administered 2021-09-09: 500 [IU] via INTRAVENOUS
  Filled 2021-09-09: qty 5

## 2021-09-09 MED ORDER — INSULIN DETEMIR 100 UNIT/ML ~~LOC~~ SOLN
35.0000 [IU] | Freq: Every day | SUBCUTANEOUS | Status: DC
  Administered 2021-09-09: 35 [IU] via SUBCUTANEOUS
  Filled 2021-09-09: qty 0.35

## 2021-09-09 MED ORDER — OXYCODONE HCL 5 MG PO TABS
5.0000 mg | ORAL_TABLET | Freq: Four times a day (QID) | ORAL | 0 refills | Status: AC | PRN
Start: 2021-09-09 — End: ?

## 2021-09-09 MED ORDER — DEXAMETHASONE 1 MG/ML PO CONC: 4.0000 mg | Freq: Two times a day (BID) | ORAL | 0 refills | Status: AC

## 2021-09-09 MED ORDER — DEXAMETHASONE 1 MG/ML PO CONC
4.0000 mg | Freq: Two times a day (BID) | ORAL | Status: DC
  Administered 2021-09-09: 4 mg via ORAL
  Filled 2021-09-09: qty 4

## 2021-09-09 MED ORDER — TRAMADOL HCL 100 MG PO TABS: 50.0000 mg | ORAL_TABLET | Freq: Four times a day (QID) | ORAL | 0 refills | Status: AC | PRN

## 2021-09-09 NOTE — Telephone Encounter (Signed)
Dr Coralie Common called wanting Dr Grayland Ormond to know that patient had her port removed while she was in the hospital and she is asking if it needs to be reinserted. ?

## 2021-09-09 NOTE — NC FL2 (Signed)
?[place] MEDICAID FL2 LEVEL OF CARE SCREENING TOOL  ?  ? ?IDENTIFICATION  ?Patient Name: ?Natalie [name] Birthdate: Jan 12, 1961 Sex: female Admission Date (Current Location): ?09/07/2021  ?[place] and [place] Number: ? [place] ?  Facility and Address:  ?[facility], [address], [place], [place] [address] ?     Provider Number: ?[id number]  ?Attending Physician Name and Address:  ?Fritzi Mandes, MD ? Relative Name and Phone Number:  ?  ?   ?Current Level of Care: ?Hospital Recommended Level of Care: ?[place] (respite care) Prior Approval Number: ?  ? ?Date Approved/Denied: ?  PASRR Number: ?[id number] A ? ?Discharge Plan: ?SNF (respite care) ?  ? ?Current Diagnoses: ?Patient Active Problem List  ? Diagnosis Date Noted  ? Metastatic lung cancer (metastasis from lung to other site) [facility]) - brain (possible spine) mets, resulting in ambulatory dysfunction 09/07/2021  ? Hypokalemia 09/07/2021  ? CKD (chronic kidney disease) stage 3, GFR 30-59 ml/min (HCC) 09/07/2021  ? Normocytic anemia 09/07/2021  ? Diabetes mellitus type 2, controlled, with complications ([place]) 53/66/4403  ? Depression with anxiety 09/07/2021  ? Brain metastases ([place])   ? Weakness   ? COPD mixed type ([place]) 08/05/2020  ? OSA on CPAP 08/05/2020  ? Status post robot-assisted surgical procedure 07/12/2020  ? Left lower lobe pulmonary nodule 07/12/2020  ? 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 DKA (diabetic ketoacidoses) 01/20/2020  ? ? ?Orientation RESPIRATION BLADDER Height & Weight   ?  ?Self, Time, Situation, Place ? Normal Incontinent Weight: 211 lb 6.4 oz (95.9 kg) ?Height:  5\' 2"  (157.5 cm)  ?BEHAVIORAL SYMPTOMS/MOOD NEUROLOGICAL BOWEL NUTRITION STATUS  ?    Incontinent Diet (carb modified thin liquids)  ?AMBULATORY STATUS COMMUNICATION OF NEEDS Skin   ?Limited Assist Verbally Normal ?  ?  ?  ?    ?     ?     ? ? ?Personal Care Assistance Level of Assistance  ?Bathing,  Feeding, Dressing Bathing Assistance: Limited assistance ?Feeding assistance: Independent ?Dressing Assistance: Limited assistance ?   ? ?Functional Limitations Info  ?Sight, Hearing, Speech Sight Info: Adequate ?Hearing Info: Adequate ?Speech Info: Adequate  ? ? ?SPECIAL CARE FACTORS FREQUENCY  ?PT (By licensed PT), OT (By licensed OT)   ?  ?PT Frequency: 5x ?OT Frequency: 5x ?  ?  ?  ?   ? ? ?Contractures Contractures Info: Not present  ? ? ?Additional Factors Info  ?Code Status, Allergies Code Status Info: full code ?Allergies Info: no known allergies ?  ?  ?  ?   ? ?Current Medications (09/09/2021):  This is the current hospital active medication list ?Current Facility-Administered Medications  ?Medication Dose Route Frequency Provider Last Rate Last Admin  ? albuterol (PROVENTIL) (2.5 MG/3ML) 0.083% nebulizer solution 3 mL  3 mL Nebulization Q6H PRN Emeterio Reeve, DO      ? atorvastatin (LIPITOR) tablet 40 mg  40 mg Oral QHS Emeterio Reeve, DO   40 mg at 09/08/21 2121  ? benztropine (COGENTIN) tablet 1 mg  1 mg Oral QHS Emeterio Reeve, DO   1 mg at 09/08/21 2119  ? Chlorhexidine Gluconate Cloth 2 % PADS 6 each  6 each Topical Daily Lloyd Huger, MD   6 each at 09/08/21 [phone]  ? citalopram (CELEXA) tablet 20 mg  20 mg Oral q morning Emeterio Reeve, DO   20 mg at 09/08/21 5956  ? dexamethasone (DECADRON) 1 MG/ML solution 4 mg  4  mg Oral Q12H Fritzi Mandes, MD      ? fluticasone furoate-vilanterol (BREO ELLIPTA) 100-25 MCG/ACT 1 puff  1 puff Inhalation Daily Emeterio Reeve, DO   1 puff at 09/08/21 0847  ? And  ? umeclidinium bromide (INCRUSE ELLIPTA) 62.5 MCG/ACT 1 puff  1 puff Inhalation Daily Emeterio Reeve, DO   1 puff at 09/08/21 0847  ? gabapentin (NEURONTIN) capsule 300 mg  300 mg Oral BID Emeterio Reeve, DO   300 mg at 09/08/21 2120  ? insulin aspart (novoLOG) injection 0-20 Units  0-20 Units Subcutaneous TID WC Emeterio Reeve, DO   4 Units at 09/08/21 1648  ? insulin  detemir (LEVEMIR) injection 35 Units  35 Units Subcutaneous Daily Fritzi Mandes, MD      ? latanoprost (XALATAN) 0.005 % ophthalmic solution 1 drop  1 drop Both Eyes Daily Emeterio Reeve, DO   1 drop at 09/08/21 0848  ? LORazepam (ATIVAN) tablet 1 mg  1 mg Oral BID Emeterio Reeve, DO   1 mg at 09/08/21 2120  ? oxyCODONE-acetaminophen (PERCOCET/ROXICET) 5-325 MG per tablet 1 tablet  1 tablet Oral Q6H PRN Emeterio Reeve, DO   1 tablet at 09/09/21 0320  ? sodium chloride flush (NS) 0.9 % injection 10-40 mL  10-40 mL Intracatheter Q12H Lloyd Huger, MD   10 mL at 09/08/21 2121  ? sodium chloride flush (NS) 0.9 % injection 10-40 mL  10-40 mL Intracatheter PRN Lloyd Huger, MD      ? ziprasidone (GEODON) capsule 80 mg  80 mg Oral QHS Emeterio Reeve, DO   80 mg at 09/08/21 2119  ? ? ? ?Discharge Medications: ?Please see discharge summary for a list of discharge medications. ? ?Relevant Imaging Results: ? ?Relevant Lab Results: ? ? ?Additional Information ?[id number] ? ?Deboraha Goar A Kiyoshi Schaab, LCSW ? ? ? ? ?

## 2021-09-09 NOTE — TOC Initial Note (Signed)
Transition of Care (TOC) - Initial/Assessment Note  ? ? ?Patient Details  ?Name: Natalie [name] ?MRN: [id number] ?Date of Birth: 02-27-1961 ? ?Transition of Care (TOC) CM/SW Contact:    ?Arlee Bossard A Shealeigh Dunstan, LCSW ?Phone Number: ?09/09/2021, 8:36 AM ? ?Clinical Narrative:    [facility] is active with [facility]. Per admission plan was to get pt into a SNF for respite care. Pt lives with her son and is unable to maneuver up and down the stairs prior to admission. [facility] is working on this placement as well. CSW has reached out to [facility], [facility], and [facility] (contracted facilities with [facility]) to see if they can accept pt. MD is aware. [facility] Worker is Arts development officer.              ? ? ?Expected Discharge Plan: [place] (snf for respite care) ?Barriers to Discharge: Continued Medical Work up ? ? ?Patient Goals and CMS Choice ?  ?  ?  ? ?Expected Discharge Plan and Services ?Expected Discharge Plan: [place] (snf for respite care) ?In-house Referral: Clinical Social Work ?  ?  ?Living arrangements for the past 2 months: Apartment ?                ?  ?  ?  ?  ?  ?  ?  ?  ?  ?  ? ?Prior Living Arrangements/Services ?Living arrangements for the past 2 months: Apartment ?Lives with:: Adult Children ?Patient language and need for interpreter reviewed:: Yes ?Do you feel safe going back to the place where you live?: Yes      ?Need for Family Participation in Patient Care: Yes (Comment) ?Care giver support system in place?: Yes (comment) ?  ?Criminal Activity/Legal Involvement Pertinent to Current Situation/Hospitalization: No - Comment as needed ? ?Activities of Daily Living ?Home Assistive Devices/Equipment: Gilford Rile (specify type), Eyeglasses ?ADL Screening (condition at time of admission) ?Patient's cognitive ability adequate to safely complete daily activities?: Yes ?Is the patient deaf or have difficulty hearing?: No ?Does the patient have difficulty seeing, even when wearing glasses/contacts?: No ?Does the patient  have difficulty concentrating, remembering, or making decisions?: No ?Patient able to express need for assistance with ADLs?: Yes ?Does the patient have difficulty dressing or bathing?: Yes ?Independently performs ADLs?: No ?Communication: Independent ?Dressing (OT): Needs assistance ?Is this a change from baseline?: Pre-admission baseline ?Grooming: Independent ?Feeding: Independent ?Bathing: Needs assistance ?Is this a change from baseline?: Pre-admission baseline ?Toileting: Needs assistance ?Is this a change from baseline?: Pre-admission baseline ?In/Out Bed: Dependent ?Is this a change from baseline?: Pre-admission baseline ?Walks in Home: Dependent ?Is this a change from baseline?: Pre-admission baseline ?Does the patient have difficulty walking or climbing stairs?: Yes ?Weakness of Legs: Right ?Weakness of Arms/Hands: Right ? ?Permission Sought/Granted ?Permission sought to share information with : Family Supports ?  ? Share Information with NAME: Ka-Lid ?   ? Permission granted to share info w Relationship: son ?   ? ?Emotional Assessment ?Appearance:: Appears stated age ?Attitude/Demeanor/Rapport: Engaged ?  ?Orientation: : Oriented to Self, Oriented to Place, Oriented to  Time, Oriented to Situation ?Alcohol / Substance Use: Not Applicable ?Psych Involvement: No (comment) ? ?Admission diagnosis:  Weakness [R53.1] ?Brain metastases ([place]) [C79.31] ?Metastatic lung cancer (metastasis from lung to other site) [facility]) [C34.90] ?Patient Active Problem List  ? Diagnosis Date Noted  ? Metastatic lung cancer (metastasis from lung to other site) [facility]) - brain (possible spine) mets, resulting in ambulatory dysfunction 09/07/2021  ? Hypokalemia 09/07/2021  ?  CKD (chronic kidney disease) stage 3, GFR 30-59 ml/min (HCC) 09/07/2021  ? Normocytic anemia 09/07/2021  ? Diabetes mellitus type 2, controlled, with complications ([place]) 78/67/5449  ? Depression with anxiety 09/07/2021  ? Brain metastases ([place])   ? Weakness   ? COPD  mixed type ([place]) 08/05/2020  ? OSA on CPAP 08/05/2020  ? Status post robot-assisted surgical procedure 07/12/2020  ? Left lower lobe pulmonary nodule 07/12/2020  ? 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 DKA (diabetic ketoacidoses) 01/20/2020  ? ?PCP:  [facility] [employer] ?Pharmacy:   ?[facility] [place] [place] ?[address] ?[place] [address] ?Phone: [phone] Fax: [phone] ? ? ? ? ?Social Determinants of Health (SDOH) Interventions ?  ? ?Readmission Risk Interventions ?   ? View : No data to display.  ?  ?  ?  ? ? ? ?

## 2021-09-09 NOTE — Progress Notes (Signed)
Called [facility] and gave report to [facility] LPN  at [phone] patient going to room 214, packet given to transport.  All belonging taken with patient  ?

## 2021-09-09 NOTE — Discharge Summary (Signed)
?Physician Discharge Summary ?  ?Patient: Natalie [name] MRN: [id number] DOB: Mar 05, 1961  ?Admit date:     09/07/2021  ?Discharge date: 09/09/21  ?Discharge Physician: Fritzi Mandes  ? ?PCP: [facility]  ? ?Recommendations at discharge:  ? ?Patient will follow-up [facility] program ?Pt will f/u with Dr Baruch Gouty Radiation oncology from Monday 9:15 am 09/12/21  ?follow-up Dr. Grayland Ormond on your scheduled appointment ? ?Discharge Diagnoses: ? 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 ? ? ?Hospital Course: ? ?Natalie [name] is a 61 y.o. female with history of lung cancer, diabetes, hypertension, CVA presents emergency department with weakness of the right hand and right leg.  Last known well was Friday afternoon (5 days prior to admission).  At first she and family thought it was just the foot and now the whole leg she is having to drag when walking. Patient is currently undergoing chemotherapy due to lung cancer with mets ?  ?Right upper and lower extremity weakness suspected due to metastatic brain lesions with largest 4.8 cm with surrounding vasogenic edema in the left parietal lobe ?-- MRI brain confirmed numerous metastatic lesions in the brain largest dominant lesion in the left parietal lobe ?-- continue IV Decadron 4 mg BID--change to po for now ?-- patient seen by Dr. Grayland Ormond ?-- Dr. Baruch Gouty radiation oncology input appreciated ?-- Patient will be scheduled to start whole brain radiation Monday 09/12/21 at 9:15 am ?-- PT OT twill be continued at [facility] ?--hold asa for now ?  ?chronic kidney disease stage III a in the setting of diabetes ?-- creatinine stable ?  ?chronic anemia-- normocytic ?-- likely anemia of chronic disease/chemotherapy effect ?-- hemoglobin stable at 7.9 ?  ?type II diabetes, mild uncontrolled in the setting of steroids ?-- A1c 6.1 ?-- continue levemir,SSI and Trulicity ?  ?Depression with  anxiety ?history of schizoaffective disorder ?-- continue Celexa, Ativan, Geodon ?  ? ?  ? ? ?Consultants: oncology, radiation oncology ?Procedures performed: none ?Disposition: Skilled nursing facility ?Diet recommendation:  ?Discharge Diet Orders (From admission, onward)  ? ?  Start     Ordered  ? 09/09/21 0000  Diet - low sodium heart healthy       ? 09/09/21 1005  ? ?  ?  ? ?  ? ?Cardiac and Carb modified diet ?DISCHARGE MEDICATION: ?Allergies as of 09/09/2021   ?No Known Allergies ?  ? ?  ?Medication List  ?  ? ?STOP taking these medications   ? ?aspirin 81 MG EC tablet ?  ?osimertinib mesylate 40 MG tablet ?Commonly known as: TAGRISSO ?  ? ?  ? ?TAKE these medications   ? ?acetaminophen 650 MG CR tablet ?Commonly known as: TYLENOL ?Take 650 mg by mouth 2 (two) times daily. ?What changed: Another medication with the same name was removed. Continue taking this medication, and follow the directions you see here. ?  ?albuterol 108 (90 Base) MCG/ACT inhaler ?Commonly known as: VENTOLIN HFA ?Inhale 2 puffs into the lungs every 4 (four) hours as needed for wheezing or shortness of breath. ?  ?atorvastatin 40 MG tablet ?Commonly known as: LIPITOR ?Take 40 mg by mouth at bedtime. ?  ?benztropine 1 MG tablet ?Commonly known as: COGENTIN ?Take 1 mg by mouth at bedtime. ?  ?citalopram 20 MG tablet ?Commonly known as: CELEXA ?Take 20 mg by mouth every morning. ?  ?clobetasol cream 0.05 % ?Commonly known as: TEMOVATE ?Apply to chest area twice  daily as needed for itching/rash ?What changed:  ?how much to take ?how to take this ?when to take this ?reasons to take this ?additional instructions ?  ?dexamethasone 1 MG/ML solution ?Commonly known as: DECADRON ?Take 4 mLs (4 mg total) by mouth every 12 (twelve) hours. ?  ?folic acid 1 MG tablet ?Commonly known as: FOLVITE ?Take 1 mg by mouth daily. ?  ?ibuprofen 400 MG tablet ?Commonly known as: ADVIL ?Take 400 mg by mouth 3 (three) times daily. (Take with acetaminophen doses) ?   ?insulin detemir 100 UNIT/ML injection ?Commonly known as: LEVEMIR ?Inject 0.35 mLs (35 Units total) into the skin daily. ?What changed:  ?how much to take ?Another medication with the same name was removed. Continue taking this medication, and follow the directions you see here. ?  ?lactulose 10 GM/15ML solution ?Commonly known as: Lyon ?Take 20 g by mouth daily as needed for mild constipation or moderate constipation. ?  ?latanoprost 0.005 % ophthalmic solution ?Commonly known as: XALATAN ?Place 1 drop into both eyes at bedtime. ?  ?lidocaine 5 % ?Commonly known as: LIDODERM ?Place 2 patches onto the skin daily. (Apply 1 patch to each foot and remove after 12 hours) ?  ?lidocaine-prilocaine cream ?Commonly known as: EMLA ?Apply to affected area once ?What changed:  ?how much to take ?how to take this ?when to take this ?reasons to take this ?additional instructions ?  ?loperamide 2 MG capsule ?Commonly known as: IMODIUM ?Take 2 mg by mouth 2 (two) times daily as needed for diarrhea or loose stools. ?  ?LORazepam 1 MG tablet ?Commonly known as: ATIVAN ?Take 1 mg by mouth 2 (two) times daily. ?  ?Narcan 4 MG/0.1ML Liqd nasal spray kit ?Generic drug: naloxone ?Place 0.4 mg into the nose daily as needed (opioid overdose). ?  ?ondansetron 4 MG disintegrating tablet ?Commonly known as: ZOFRAN-ODT ?Take 4 mg by mouth every 8 (eight) hours as needed for nausea or vomiting. ?  ?oxyCODONE 5 MG immediate release tablet ?Commonly known as: Oxy IR/ROXICODONE ?Take 1 tablet (5 mg total) by mouth every 6 (six) hours as needed for moderate pain or severe pain. ?  ?pregabalin 100 MG capsule ?Commonly known as: LYRICA ?Take 100 mg by mouth daily. ?  ?senna-docusate 8.6-50 MG tablet ?Commonly known as: Senokot-S ?Take 2 tablets by mouth at bedtime. ?  ?traMADol HCl 100 MG Tabs ?Take 50 mg by mouth every 6 (six) hours as needed. ?What changed: reasons to take this ?  ?Trelegy Ellipta 100-62.5-25 MCG/ACT Aepb ?Generic drug:  Fluticasone-Umeclidin-Vilant ?Inhale 1 puff into the lungs daily. ?  ?trolamine salicylate 10 % cream ?Commonly known as: ASPERCREME ?Apply 1 application. topically 3 (three) times daily as needed for muscle pain. ?  ?Trulicity 1.5 FX/9.0WI Sopn ?Generic drug: Dulaglutide ?Inject 1.5 mg into the skin every Thursday. ?  ?ziprasidone 80 MG capsule ?Commonly known as: GEODON ?Take 80 mg by mouth at bedtime. ?  ? ?  ? ? Follow-up Information   ? ? [facility]. Go to.   ?Why: on your appt with [facility] program ?Contact information: ?[address] ?[place] [address] ?[phone] ? ? ?  ?  ? ?  ?  ? ?  ? ?Discharge Exam: ?Danley Danker Weights  ? 09/07/21 1130  ?Weight: 95.9 kg  ? ? ? ?Condition at discharge: fair ? ?The results of significant diagnostics from this hospitalization (including imaging, microbiology, ancillary and laboratory) are listed below for reference.  ? ?Imaging Studies: ?CT HEAD WO CONTRAST ? ?  Result Date: 09/07/2021 ?CLINICAL DATA:  Neuro deficit, stroke suspected. History of lung cancer EXAM: CT HEAD WITHOUT CONTRAST TECHNIQUE: Contiguous axial images were obtained from the base of the skull through the vertex without intravenous contrast. RADIATION DOSE REDUCTION: This exam was performed according to the departmental dose-optimization program which includes automated exposure control, adjustment of the mA and/or kV according to patient size and/or use of iterative reconstruction technique. COMPARISON:  Brain MRI 08/25/2020 FINDINGS: Brain: There is a solid and cystic mass centered in the left parietal lobe measuring up to approximally 4.8 cm AP x 3.7 cm cc by 3.0 cm TV consistent with metastatic disease (3-17, 5-33). There is significant surrounding vasogenic edema resulting in regional sulcal effacement and partial effacement of the left lateral ventricle without midline shift. There is an additional focus of hyperdensity in the right anterior frontal lobe measuring 1.0 cm  consistent with additional metastatic disease, possibly with intralesional hemorrhage. There is no significant mass effect caused by this lesion. Additional suspected metastatic lesions are seen in the right cerebellar hemi

## 2021-09-09 NOTE — Progress Notes (Signed)
Occupational Therapy Treatment ?Patient Details ?Name: Natalie [name] ?MRN: [id number] ?DOB: 1961-03-07 ?Today's Date: 09/09/2021 ? ? ?History of present illness Pt is a 61 y.o. female presenting to hospital 3/22 with weakness of the R hand and R leg (last know well was Friday afternoon); pt having to drag leg when walking; currently undergoing chemotherapy d/t metastatic lung CA.  MRI of brain showing "1. At least 10 intraparenchymal metastatic lesions described in detail above, the largest dominant solid and cystic lesion centered in the left parietal lobe measuring up to 4.7 cm with perilesional  edema and regional mass effect resulting in trace rightward midline  shift.  2. 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  3. No evidence of associated hemorrhage, as was suspected on the prior CT".  Pt admitted with metastatic lung CA, brain mets, and possible spine mets resulting in ambulatory dysfunction; also hypokalemia, CKD, and normocytic anemia.  PMH includes lung CA, DM, htn, CVA, COPD, Hep C, OSA on CPAP, and schizoaffective disorder. ?  ?OT comments ? Upon entering the room,pt seated in recliner chair. Session focused on R NMR and ADLs. OT providing pt with green resistive theraputty to address strengthening and coordination. Pt showing decreased insight to deficits this session as she tells therapist she does not have any difficulty with R UE use. However, as session continues and she has increased difficulty with exercises she acknowledges difficult. Pt needing to dress for discharge with min A for LB dressing to don B socks and shoes. Pt stands with min guard - min A to pull pants over B hips. Pt needing min A to fasten bra and she pulls shirt over head with set up A. Pt remains in recliner chair at end of session with all needs within reach.   ? ?Recommendations for follow up therapy are one component of a multi-disciplinary discharge planning process, led by the attending physician.   Recommendations may be updated based on patient status, additional functional criteria and insurance authorization. ?   ?Follow Up Recommendations ? Skilled nursing-short term rehab (<3 hours/day)  ?  ?Assistance Recommended at Discharge Intermittent Supervision/Assistance  ?Patient can return home with the following ? A little help with walking and/or transfers;A little help with bathing/dressing/bathroom;Help with stairs or ramp for entrance;Assistance with cooking/housework;Assist for transportation ?  ?Equipment Recommendations ? None recommended by OT  ?  ?   ?Precautions / Restrictions Precautions ?Precautions: Fall ?Precaution Comments: Bone metastases ?Restrictions ?Weight Bearing Restrictions: No  ? ? ?  ? ?Mobility Bed Mobility ?  ?  ?  ?  ?  ?  ?  ?General bed mobility comments: seated in recliner chair ?  ? ?Transfers ?Overall transfer level: Needs assistance ?Equipment used: 1 person hand held assist ?Transfers: Sit to/from Stand ?Sit to Stand: Min assist ?  ?  ?  ?  ?  ?  ?  ?  ?Balance Overall balance assessment: Needs assistance ?Sitting-balance support: No upper extremity supported, Feet supported ?Sitting balance-Leahy Scale: Good ?  ?  ?Standing balance support: Single extremity supported ?Standing balance-Leahy Scale: Fair ?  ?  ?  ?  ?  ?  ?  ?  ?  ?  ?  ?  ?   ? ?ADL either performed or assessed with clinical judgement  ? ?ADL Overall ADL's : Needs assistance/impaired ?  ?  ?  ?  ?  ?  ?  ?  ?Upper Body Dressing : Minimal assistance;Sitting ?  ?Lower Body Dressing: Minimal assistance;Sit  to/from stand ?  ?  ?  ?  ?  ?  ?  ?  ?  ?  ? ?Extremity/Trunk Assessment Upper Extremity Assessment ?RUE Deficits / Details: decreased dexterity, speed, and strength in R UE. ?  ?  ?  ?  ?  ? ?Vision Patient Visual Report: No change from baseline ?  ?  ?   ?   ? ?Cognition Arousal/Alertness: Awake/alert ?Behavior During Therapy: [facility] for tasks assessed/performed ?Overall Cognitive Status: Within Functional  Limits for tasks assessed ?  ?  ?  ?  ?  ?  ?  ?  ?  ?  ?  ?  ?  ?  ?  ?  ?  ?  ?  ?   ?   ?   ?   ? ? ?Pertinent Vitals/ Pain       Pain Assessment ?Pain Assessment: No/denies pain ? ?   ? ?Progress Toward Goals ? ?OT Goals(current goals can now be found in the care plan section) ? Progress towards OT goals: Progressing toward goals ? ?Acute Rehab OT Goals ?Patient Stated Goal: to go home ?OT Goal Formulation: With patient ?Time For Goal Achievement: 09/22/21 ?Potential to Achieve Goals: Fair  ?Plan Discharge plan needs to be updated;Frequency remains appropriate   ? ?   ?AM-PAC OT "6 Clicks" Daily Activity     ?Outcome Measure ? ? Help from another person eating meals?: None ?Help from another person taking care of personal grooming?: A Little ?Help from another person toileting, which includes using toliet, bedpan, or urinal?: A Little ?Help from another person bathing (including washing, rinsing, drying)?: A Little ?Help from another person to put on and taking off regular upper body clothing?: A Little ?Help from another person to put on and taking off regular lower body clothing?: A Little ?6 Click Score: 19 ? ?  ?End of Session   ? ?OT Visit Diagnosis: Unsteadiness on feet (R26.81);Repeated falls (R29.6);Muscle weakness (generalized) (M62.81) ?  ?Activity Tolerance Patient tolerated treatment well ?  ?Patient Left in chair;with call bell/phone within reach;with chair alarm set ?  ?Nurse Communication Mobility status ?  ? ?   ? ?Time: 1040-1103 ?OT Time Calculation (min): 23 min ? ?Charges: OT General Charges ?$OT Visit: 1 Visit ?OT Treatments ?$Self Care/Home Management : 8-22 mins ?$Neuromuscular Re-education: 8-22 mins ? ?Darleen [name], [place], OTR/L , CBIS ?ascom [phone]  ?09/09/21, 12:31 PM  ?

## 2021-09-09 NOTE — TOC Transition Note (Addendum)
Transition of Care (TOC) - CM/SW Discharge Note ? ? ?Patient Details  ?Name: Natalie [name] ?MRN: [id number] ?Date of Birth: 06/25/1960 ? ?Transition of Care (TOC) CM/SW Contact:  ?Richard Ritchey A Kendrick Haapala, LCSW ?Phone Number: ?09/09/2021, 10:38 AM ? ? ?Clinical Narrative:    ?Pt will dc to [facility] today for respite care. Confirmed pt will not need a new covid test. Pt will be transported via [facility]. Sharyn Lull [facility] social worker is setting that up and will call CSW once time is determined. Charge RN updated. ? ?RN to call report [phone] ? ?Final next level of care: [place] ?Barriers to Discharge: No Barriers Identified ? ? ?Patient Goals and CMS Choice ?  ?  ?  ? ?Discharge Placement ?  ?           ?Patient chooses bed at:  [facility]) ?Patient to be transferred to facility by: [facility] ?  ?Patient and family notified of of transfer: 09/09/21 ? ?Discharge Plan and Services ?In-house Referral: Clinical Social Work ?  ?           ?  ?  ?  ?  ?  ?  ?  ?  ?  ?  ? ?Social Determinants of Health (SDOH) Interventions ?  ? ? ?Readmission Risk Interventions ?   ? View : No data to display.  ?  ?  ?  ? ? ? ? ? ?

## 2021-09-09 NOTE — Progress Notes (Signed)
Physical Therapy Treatment ?Patient Details ?Name: Natalie [name] ?MRN: [id number] ?DOB: 02-05-1961 ?Today's Date: 09/09/2021 ? ? ?History of Present Illness Pt is a 61 y.o. female presenting to hospital 3/22 with weakness of the R hand and R leg (last know well was Friday afternoon); pt having to drag leg when walking; currently undergoing chemotherapy d/t metastatic lung CA.  MRI of brain showing "1. At least 10 intraparenchymal metastatic lesions described in detail above, the largest dominant solid and cystic lesion centered in the left parietal lobe measuring up to 4.7 cm with perilesional  edema and regional mass effect resulting in trace rightward midline  shift.  2. 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  3. No evidence of associated hemorrhage, as was suspected on the prior CT".  Pt admitted with metastatic lung CA, brain mets, and possible spine mets resulting in ambulatory dysfunction; also hypokalemia, CKD, and normocytic anemia.  PMH includes lung CA, DM, htn, CVA, COPD, Hep C, OSA on CPAP, and schizoaffective disorder. ? ?  ?PT Comments  ? ? Pt resting in bed upon PT arrival; pt initially declining PT but with encouragement of pt's daughter on phone, pt agreeable to trying to walk (pt initially declining PT d/t feeling like she had not changed/improved in her R LE strength since yesterday).  Modified independent with bed mobility; min assist with transfers using RW; and CGA to min assist to ambulate 30 feet with RW (pt with difficulty advancing R LE d/t weakness--see below for gait details).  Pt reports going to SNF today d/t difficulties with walking (TOC confirms pt has SNF bed for today); PT recommendations updated to SNF d/t gait impairments and concerns for pt's ability to manage multiple steps into home. ?   ?Recommendations for follow up therapy are one component of a multi-disciplinary discharge planning process, led by the attending physician.  Recommendations may be updated  based on patient status, additional functional criteria and insurance authorization. ? ?Follow Up Recommendations ? Skilled nursing-short term rehab (<3 hours/day) ?  ?  ?Assistance Recommended at Discharge Intermittent Supervision/Assistance  ?Patient can return home with the following A little help with walking and/or transfers;A little help with bathing/dressing/bathroom;Assistance with cooking/housework;Help with stairs or ramp for entrance;Assist for transportation ?  ?Equipment Recommendations ? Rolling walker (2 wheels);BSC/3in1;Wheelchair (measurements PT);Wheelchair cushion (measurements PT) (youth sized)  ?  ?Recommendations for Other Services OT consult ? ? ?  ?Precautions / Restrictions Precautions ?Precautions: Fall ?Precaution Comments: Bone metastases ?Restrictions ?Weight Bearing Restrictions: No  ?  ? ?Mobility ? Bed Mobility ?Overal bed mobility: Modified Independent ?  ?  ?  ?  ?  ?  ?General bed mobility comments: Semi-supine to sitting edge of bed with mild increased effort to perform on own. ?  ? ?Transfers ?Overall transfer level: Needs assistance ?Equipment used: Rolling walker (2 wheels) ?Transfers: Sit to/from Stand ?Sit to Stand: Min assist ?  ?  ?  ?  ?  ?General transfer comment: vc's for UE placement ?  ? ?Ambulation/Gait ?Ambulation/Gait assistance: Min guard, Min assist ?Gait Distance (Feet): 30 Feet ?Assistive device: Rolling walker (2 wheels) ?  ?Gait velocity: decreased ?  ?  ?General Gait Details: occasional vc's to keep R LE within walker (especially with turns); decreased stance time R LE; difficulty noted advancing R LE (pt externally rotating R LE in order to advance requiring vc's to take longer R LE step length and for R LE positioning in more neutral position) ? ? ?Stairs ?  ?  ?  ?  ?  ? ? ?  Wheelchair Mobility ?  ? ?Modified Rankin (Stroke Patients Only) ?  ? ? ?  ?Balance Overall balance assessment: Needs assistance ?Sitting-balance support: No upper extremity supported,  Feet supported ?Sitting balance-Leahy Scale: Good ?Sitting balance - Comments: steady sitting reaching within BOS ?  ?Standing balance support: Single extremity supported ?Standing balance-Leahy Scale: Fair ?Standing balance comment: steady standing with at least single UE support on RW ?  ?  ?  ?  ?  ?  ?  ?  ?  ?  ?  ?  ? ?  ?Cognition Arousal/Alertness: Awake/alert ?Behavior During Therapy: [facility] for tasks assessed/performed ?Overall Cognitive Status: Within Functional Limits for tasks assessed ?  ?  ?  ?  ?  ?  ?  ?  ?  ?  ?  ?  ?  ?  ?  ?  ?  ?  ?  ? ?  ?Exercises   ? ?  ?General Comments   ?  ?  ? ?Pertinent Vitals/Pain Pain Assessment ?Pain Assessment: No/denies pain ?Vitals (HR and O2 on room air) stable and WFL throughout treatment session.  ? ? ?Home Living   ?  ?  ?  ?  ?  ?  ?  ?  ?  ?   ?  ?Prior Function    ?  ?  ?   ? ?PT Goals (current goals can now be found in the care plan section) Acute Rehab PT Goals ?Patient Stated Goal: to improve strength and mobility ?PT Goal Formulation: With patient ?Time For Goal Achievement: 09/22/21 ?Potential to Achieve Goals: Good ?Progress towards PT goals: Progressing toward goals ? ?  ?Frequency ? ? ? Min 2X/week ? ? ? ?  ?PT Plan Discharge plan needs to be updated (TOC already reports pt has SNF bed for today)  ? ? ?Co-evaluation   ?  ?  ?  ?  ? ?  ?AM-PAC PT "6 Clicks" Mobility   ?Outcome Measure ? Help needed turning from your back to your side while in a flat bed without using bedrails?: None ?Help needed moving from lying on your back to sitting on the side of a flat bed without using bedrails?: None ?Help needed moving to and from a bed to a chair (including a wheelchair)?: A Little ?Help needed standing up from a chair using your arms (e.g., wheelchair or bedside chair)?: A Little ?Help needed to walk in hospital room?: A Little ?Help needed climbing 3-5 steps with a railing? : A Lot ?6 Click Score: 19 ? ?  ?End of Session Equipment Utilized During Treatment:  Gait belt ?Activity Tolerance: Patient limited by fatigue ?Patient left: in chair;with call bell/phone within reach;with chair alarm set ?Nurse Communication: Mobility status;Precautions;Other (comment) (via white board) ?PT Visit Diagnosis: Other abnormalities of gait and mobility (R26.89);Muscle weakness (generalized) (M62.81);Hemiplegia and hemiparesis ?Hemiplegia - Right/Left: Right ?Hemiplegia - dominant/non-dominant: Dominant ?Hemiplegia - caused by: Unspecified ?  ? ? ?Time: 0630-1601 ?PT Time Calculation (min) (ACUTE ONLY): 16 min ? ?Charges:  $Gait Training: 8-22 mins          ?          ?Leitha Bleak, PT ?09/09/21, 11:41 AM ? ? ?

## 2021-09-09 NOTE — Care Management Important Message (Signed)
Important Message ? ?Patient Details  ?Name: Natalie [name] ?MRN: [id number] ?Date of Birth: 19-Jul-1960 ? ? ?Medicare Important Message Given:  N/A - LOS <3 / Initial given by admissions ? ? ? ? ?Juliann Pulse A Emmalea Treanor ?09/09/2021, 8:00 AM ?

## 2021-09-12 ENCOUNTER — Encounter: Payer: Self-pay | Admitting: Oncology

## 2021-09-12 ENCOUNTER — Ambulatory Visit
Admission: RE | Admit: 2021-09-12 | Discharge: 2021-09-12 | Disposition: A | Discharge status: Home / Self Care | Payer: Medicaid Other | Source: Ambulatory Visit | Attending: Radiation Oncology | Admitting: Radiation Oncology

## 2021-09-12 DIAGNOSIS — Z510 Encounter for antineoplastic radiation therapy: Secondary | ICD-10-CM | POA: Diagnosis present

## 2021-09-12 DIAGNOSIS — C3432 Malignant neoplasm of lower lobe, left bronchus or lung: Secondary | ICD-10-CM | POA: Diagnosis present

## 2021-09-12 DIAGNOSIS — C7931 Secondary malignant neoplasm of brain: Secondary | ICD-10-CM | POA: Diagnosis present

## 2021-09-13 ENCOUNTER — Ambulatory Visit
Admission: RE | Admit: 2021-09-13 | Discharge: 2021-09-13 | Disposition: A | Discharge status: Home / Self Care | Payer: Medicaid Other | Source: Ambulatory Visit | Attending: Radiation Oncology | Admitting: Radiation Oncology

## 2021-09-13 DIAGNOSIS — Z510 Encounter for antineoplastic radiation therapy: Secondary | ICD-10-CM | POA: Diagnosis not present

## 2021-09-14 ENCOUNTER — Ambulatory Visit
Admission: RE | Admit: 2021-09-14 | Discharge: 2021-09-14 | Disposition: A | Discharge status: Home / Self Care | Payer: Medicaid Other | Source: Ambulatory Visit | Attending: Radiation Oncology | Admitting: Radiation Oncology

## 2021-09-14 DIAGNOSIS — Z510 Encounter for antineoplastic radiation therapy: Secondary | ICD-10-CM | POA: Diagnosis not present

## 2021-09-15 ENCOUNTER — Ambulatory Visit
Admission: RE | Admit: 2021-09-15 | Discharge: 2021-09-15 | Disposition: A | Discharge status: Home / Self Care | Payer: Medicaid Other | Source: Ambulatory Visit | Attending: Radiation Oncology | Admitting: Radiation Oncology

## 2021-09-15 DIAGNOSIS — Z510 Encounter for antineoplastic radiation therapy: Secondary | ICD-10-CM | POA: Diagnosis not present

## 2021-09-16 ENCOUNTER — Ambulatory Visit
Admission: RE | Admit: 2021-09-16 | Discharge: 2021-09-16 | Disposition: A | Discharge status: Home / Self Care | Payer: Medicaid Other | Source: Ambulatory Visit | Attending: Radiation Oncology | Admitting: Radiation Oncology

## 2021-09-16 DIAGNOSIS — Z510 Encounter for antineoplastic radiation therapy: Secondary | ICD-10-CM | POA: Diagnosis not present

## 2021-09-18 NOTE — Progress Notes (Signed)
?[facility]  ?[phone] [phone] ? ?IDKarma Greaser OB: [birth year]  MR#: [id number]  YYQ#:[id number] ? ?Patient Care Team: ?[facility] as PCP - General ?Telford Nab, RN as Oncology Nurse Navigator ?Lloyd Huger, MD as Consulting Physician (Hematology and Oncology) ? 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  ? ?INTERVAL HISTORY: Patient returns to clinic today for further evaluation, hospital follow-up, and consideration of cycle 3 of Tecentriq.  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  She is also on Decadron.  She has some mild weakness in her right leg, but otherwise feels well.  She has no other neurologic complaints.  She denies any recent fevers or illnesses.  She has a good appetite and denies weight loss.  She has no chest pain, shortness of breath, cough, or hemoptysis.  She denies any nausea, vomiting, constipation, or diarrhea.  She has no urinary complaints.  Patient offers no further specific complaints today. ? ?REVIEW OF SYSTEMS:   ?Review of Systems  ?Constitutional:  Positive for malaise/fatigue. Negative for fever and weight loss.  ?Respiratory: Negative.  Negative for cough, hemoptysis and shortness of breath.   ?Cardiovascular: Negative.  Negative for chest pain and leg swelling.  ?Gastrointestinal: Negative.  Negative for abdominal pain.  ?Genitourinary: Negative.  Negative for dysuria and flank pain.  ?Musculoskeletal:  Negative for back pain and joint pain.  ?     Leg pain  ?Skin: Negative.  Negative for rash.  ?Neurological:  Positive for focal weakness and weakness. Negative for dizziness and headaches.  ?Psychiatric/Behavioral: Negative.  The patient is not nervous/anxious.   ? ?As per HPI. Otherwise, a complete review of systems is negative. ? ?PAST MEDICAL  HISTORY: ?Past Medical History:  ?Diagnosis Date  ? Asthma   ? COPD (chronic obstructive pulmonary disease) ([place])   ? CVA (cerebral vascular accident) [facility]) 2014  ? Depression   ? Diabetes mellitus ([place])   ? Diabetes mellitus without complication ([place])   ? Hepatitis C   ? History of kidney stones   ? Hyperlipidemia   ? Iron deficiency anemia   ? Obesity   ? OSA on CPAP   ? Mild, noncompliant with CPAP said cpap machine was recalled, is waiting for a replacement   ? Paranoia ([place])   ? Schizoaffective disorder, bipolar type ([place])   ? ? ?PAST SURGICAL HISTORY: ?Past Surgical History:  ?Procedure Laterality Date  ? BREAST CYST EXCISION Bilateral 2013  ? Benign  ? CESAREAN SECTION    ? CHOLECYSTECTOMY    ? COLONOSCOPY    ? ESOPHAGOGASTRODUODENOSCOPY  2020  ? done in [place]  ? INGUINAL HERNIA REPAIR Left 2002  ? INTERCOSTAL NERVE BLOCK N/A 07/12/2020  ? Procedure: INTERCOSTAL NERVE BLOCK;  Surgeon: Melrose Nakayama, MD;  Location: [facility];  Service: Thoracic;  Laterality: N/A;  ? IR IMAGING GUIDED PORT INSERTION  08/20/2020  ? LYMPH NODE DISSECTION Left 07/12/2020  ? Procedure: LYMPH NODE DISSECTION;  Surgeon: Melrose Nakayama, MD;  Location: [facility];  Service: Thoracic;  Laterality: Left;  ? UMBILICAL HERNIA REPAIR    ? VIDEO BRONCHOSCOPY WITH ENDOBRONCHIAL NAVIGATION N/A 06/07/2020  ? Procedure: VIDEO BRONCHOSCOPY WITH ENDOBRONCHIAL NAVIGATION;  Surgeon: Tyler Pita, MD;  Location: [facility];  Service: Pulmonary;  Laterality: N/A;  ? ? ?FAMILY HISTORY: ?No family history on file. ? ?ADVANCED  DIRECTIVES (Y/N):  N ? ?HEALTH MAINTENANCE: ?Social History  ? ?Tobacco Use  ? Smoking status: Former  ?  Packs/day: 2.00  ?  Years: 38.00  ?  Pack years: 76.00  ?  Types: Cigarettes  ?  Quit date: 2012  ?  Years since quitting: 11.2  ? Smokeless tobacco: Former  ?Vaping Use  ? Vaping Use: Never used  ?Substance Use Topics  ? Alcohol use: Yes  ?  Comment: occassionaly  ? Drug use: Not Currently  ? ? ? Colonoscopy: ? PAP: ? Bone  density: ? Lipid panel: ? ?No Known Allergies ? ?Current Outpatient Medications  ?Medication Sig Dispense Refill  ? acetaminophen (TYLENOL) 650 MG CR tablet Take 650 mg by mouth 2 (two) times daily.    ? albuterol (VENTOLIN HFA) 108 (90 Base) MCG/ACT inhaler Inhale 2 puffs into the lungs every 4 (four) hours as needed for wheezing or shortness of breath.    ? atorvastatin (LIPITOR) 40 MG tablet Take 40 mg by mouth at bedtime.    ? benztropine (COGENTIN) 1 MG tablet Take 1 mg by mouth at bedtime.    ? citalopram (CELEXA) 20 MG tablet Take 20 mg by mouth every morning.    ? clobetasol cream (TEMOVATE) 0.05 % Apply to chest area twice daily as needed for itching/rash (Patient taking differently: Apply 1 application. topically 2 (two) times daily as needed (rash on chest).) 30 g 1  ? dexamethasone (DECADRON) 1 MG/ML solution Take 4 mLs (4 mg total) by mouth every 12 (twelve) hours. 30 mL 0  ? Dulaglutide (TRULICITY) 1.5 WC/5.8NI SOPN Inject 1.5 mg into the skin every Thursday.    ? Fluticasone-Umeclidin-Vilant (TRELEGY ELLIPTA) 100-62.5-25 MCG/INH AEPB Inhale 1 puff into the lungs daily.    ? folic acid (FOLVITE) 1 MG tablet Take 1 mg by mouth daily.    ? ibuprofen (ADVIL) 400 MG tablet Take 400 mg by mouth 3 (three) times daily. (Take with acetaminophen doses)    ? insulin detemir (LEVEMIR) 100 UNIT/ML injection Inject 0.35 mLs (35 Units total) into the skin daily. 10 mL 11  ? lactulose (CHRONULAC) 10 GM/15ML solution Take 20 g by mouth daily as needed for mild constipation or moderate constipation.    ? latanoprost (XALATAN) 0.005 % ophthalmic solution Place 1 drop into both eyes at bedtime.    ? lidocaine (LIDODERM) 5 % Place 2 patches onto the skin daily. (Apply 1 patch to each foot and remove after 12 hours)    ? lidocaine-prilocaine (EMLA) cream Apply to affected area once (Patient taking differently: Apply 1 application. topically daily as needed (pain prevention).) 30 g 3  ? loperamide (IMODIUM) 2 MG capsule  Take 2 mg by mouth 2 (two) times daily as needed for diarrhea or loose stools.    ? LORazepam (ATIVAN) 1 MG tablet Take 1 mg by mouth 2 (two) times daily.    ? ondansetron (ZOFRAN-ODT) 4 MG disintegrating tablet Take 4 mg by mouth every 8 (eight) hours as needed for nausea or vomiting.    ? oxyCODONE (OXY IR/ROXICODONE) 5 MG immediate release tablet Take 1 tablet (5 mg total) by mouth every 6 (six) hours as needed for moderate pain or severe pain. 10 tablet 0  ? pregabalin (LYRICA) 100 MG capsule Take 100 mg by mouth daily.    ? senna-docusate (SENOKOT-S) 8.6-50 MG tablet Take 2 tablets by mouth at bedtime.    ? traMADol HCl 100 MG TABS Take 50 mg by mouth every 6 (  six) hours as needed. 10 tablet 0  ? trolamine salicylate (ASPERCREME) 10 % cream Apply 1 application. topically 3 (three) times daily as needed for muscle pain.    ? ziprasidone (GEODON) 80 MG capsule Take 80 mg by mouth at bedtime.    ? NARCAN 4 MG/0.1ML LIQD nasal spray kit Place 0.4 mg into the nose daily as needed (opioid overdose). (Patient not taking: Reported on 09/20/2021)    ? ?No current facility-administered medications for this visit.  ? ? ?OBJECTIVE: ?Vitals:  ? 09/20/21 0925  ?BP: 112/65  ?Pulse: 81  ?Resp: 18  ?Temp: (!) 96.8 ?F (36 ?C)  ?SpO2: 99%  ?   Body mass index is 38.56 kg/m?Marland Kitchen    ECOG FS:1 - Symptomatic but completely ambulatory ? ?General: Well-developed, well-nourished, no acute distress.  Sitting in a wheelchair. ?Eyes: Pink conjunctiva, anicteric sclera. ?HEENT: Normocephalic, moist mucous membranes. ?Lungs: No audible wheezing or coughing. ?Heart: Regular rate and rhythm. ?Abdomen: Soft, nontender, no obvious distention. ?Musculoskeletal: No edema, cyanosis, or clubbing. ?Neuro: Alert, answering all questions appropriately. Cranial nerves grossly intact. ?Skin: No rashes or petechiae noted. ?Psych: Normal affect. ? ?LAB RESULTS: ? ?Lab Results  ?Component Value Date  ? NA 132 (L) 09/20/2021  ? K 4.0 09/20/2021  ? CL 99  09/20/2021  ? CO2 22 09/20/2021  ? GLUCOSE 311 (H) 09/20/2021  ? BUN 34 (H) 09/20/2021  ? CREATININE 1.46 (H) 09/20/2021  ? CALCIUM 9.4 09/20/2021  ? PROT 7.0 09/20/2021  ? ALBUMIN 3.7 09/20/2021  ? AST 46 (H) 04/04/202

## 2021-09-19 ENCOUNTER — Ambulatory Visit
Admission: RE | Admit: 2021-09-19 | Discharge: 2021-09-19 | Disposition: A | Discharge status: Home / Self Care | Payer: Medicaid Other | Source: Ambulatory Visit | Attending: Radiation Oncology | Admitting: Radiation Oncology

## 2021-09-19 DIAGNOSIS — C7931 Secondary malignant neoplasm of brain: Secondary | ICD-10-CM | POA: Insufficient documentation

## 2021-09-19 DIAGNOSIS — Z79899 Other long term (current) drug therapy: Secondary | ICD-10-CM | POA: Insufficient documentation

## 2021-09-19 DIAGNOSIS — C3432 Malignant neoplasm of lower lobe, left bronchus or lung: Secondary | ICD-10-CM | POA: Insufficient documentation

## 2021-09-19 DIAGNOSIS — Z510 Encounter for antineoplastic radiation therapy: Secondary | ICD-10-CM | POA: Insufficient documentation

## 2021-09-20 ENCOUNTER — Ambulatory Visit: Payer: Medicaid Other

## 2021-09-20 ENCOUNTER — Ambulatory Visit
Admission: RE | Admit: 2021-09-20 | Discharge: 2021-09-20 | Disposition: A | Discharge status: Home / Self Care | Payer: Medicaid Other | Source: Ambulatory Visit | Attending: Radiation Oncology | Admitting: Radiation Oncology

## 2021-09-20 ENCOUNTER — Inpatient Hospital Stay (HOSPITAL_BASED_OUTPATIENT_CLINIC_OR_DEPARTMENT_OTHER): Payer: Medicaid Other | Admitting: Oncology

## 2021-09-20 ENCOUNTER — Inpatient Hospital Stay: Payer: Medicaid Other | Attending: Oncology

## 2021-09-20 ENCOUNTER — Inpatient Hospital Stay: Payer: Medicaid Other

## 2021-09-20 VITALS — BP 112/65 | HR 81 | Temp 96.8°F | Resp 18 | Ht 62.0 in | Wt 210.8 lb

## 2021-09-20 DIAGNOSIS — C7931 Secondary malignant neoplasm of brain: Secondary | ICD-10-CM | POA: Insufficient documentation

## 2021-09-20 DIAGNOSIS — C3432 Malignant neoplasm of lower lobe, left bronchus or lung: Secondary | ICD-10-CM | POA: Diagnosis not present

## 2021-09-20 DIAGNOSIS — C787 Secondary malignant neoplasm of liver and intrahepatic bile duct: Secondary | ICD-10-CM | POA: Insufficient documentation

## 2021-09-20 DIAGNOSIS — Z79899 Other long term (current) drug therapy: Secondary | ICD-10-CM | POA: Insufficient documentation

## 2021-09-20 DIAGNOSIS — C772 Secondary and unspecified malignant neoplasm of intra-abdominal lymph nodes: Secondary | ICD-10-CM | POA: Insufficient documentation

## 2021-09-20 DIAGNOSIS — Z510 Encounter for antineoplastic radiation therapy: Secondary | ICD-10-CM | POA: Diagnosis not present

## 2021-09-20 DIAGNOSIS — C7951 Secondary malignant neoplasm of bone: Secondary | ICD-10-CM | POA: Insufficient documentation

## 2021-09-20 DIAGNOSIS — Z5112 Encounter for antineoplastic immunotherapy: Secondary | ICD-10-CM | POA: Insufficient documentation

## 2021-09-20 LAB — CBC WITH DIFFERENTIAL/PLATELET
Abs Immature Granulocytes: 0.21 10*3/uL — ABNORMAL HIGH (ref 0.00–0.07)
Basophils Absolute: 0 10*3/uL (ref 0.0–0.1)
Basophils Relative: 0 %
Eosinophils Absolute: 0 10*3/uL (ref 0.0–0.5)
Eosinophils Relative: 0 %
HCT: 30.7 % — ABNORMAL LOW (ref 36.0–46.0)
Hemoglobin: 10 g/dL — ABNORMAL LOW (ref 12.0–15.0)
Immature Granulocytes: 2 %
Lymphocytes Relative: 10 %
Lymphs Abs: 1.2 10*3/uL (ref 0.7–4.0)
MCH: 27.9 pg (ref 26.0–34.0)
MCHC: 32.6 g/dL (ref 30.0–36.0)
MCV: 85.5 fL (ref 80.0–100.0)
Monocytes Absolute: 0.4 10*3/uL (ref 0.1–1.0)
Monocytes Relative: 3 %
Neutro Abs: 10.9 10*3/uL — ABNORMAL HIGH (ref 1.7–7.7)
Neutrophils Relative %: 85 %
Platelets: 222 10*3/uL (ref 150–400)
RBC: 3.59 MIL/uL — ABNORMAL LOW (ref 3.87–5.11)
RDW: 16.1 % — ABNORMAL HIGH (ref 11.5–15.5)
WBC: 12.8 10*3/uL — ABNORMAL HIGH (ref 4.0–10.5)
nRBC: 0 % (ref 0.0–0.2)

## 2021-09-20 LAB — COMPREHENSIVE METABOLIC PANEL
ALT: 68 U/L — ABNORMAL HIGH (ref 0–44)
AST: 46 U/L — ABNORMAL HIGH (ref 15–41)
Albumin: 3.7 g/dL (ref 3.5–5.0)
Alkaline Phosphatase: 172 U/L — ABNORMAL HIGH (ref 38–126)
Anion gap: 11 (ref 5–15)
BUN: 34 mg/dL — ABNORMAL HIGH (ref 8–23)
CO2: 22 mmol/L (ref 22–32)
Calcium: 9.4 mg/dL (ref 8.9–10.3)
Chloride: 99 mmol/L (ref 98–111)
Creatinine, Ser: 1.46 mg/dL — ABNORMAL HIGH (ref 0.44–1.00)
GFR, Estimated: 40 mL/min — ABNORMAL LOW (ref >60)
Glucose, Bld: 311 mg/dL — ABNORMAL HIGH (ref 70–99)
Potassium: 4 mmol/L (ref 3.5–5.1)
Sodium: 132 mmol/L — ABNORMAL LOW (ref 135–145)
Total Bilirubin: 0.7 mg/dL (ref 0.3–1.2)
Total Protein: 7 g/dL (ref 6.5–8.1)

## 2021-09-20 LAB — TSH: TSH: 1.132 u[IU]/mL (ref 0.350–4.500)

## 2021-09-20 MED ORDER — ZOLEDRONIC ACID 4 MG/100ML IV SOLN
4.0000 mg | Freq: Once | INTRAVENOUS | Status: AC
  Administered 2021-09-20: 4 mg via INTRAVENOUS
  Filled 2021-09-20: qty 100

## 2021-09-20 MED ORDER — HEPARIN SOD (PORK) LOCK FLUSH 100 UNIT/ML IV SOLN
INTRAVENOUS | Status: AC
  Administered 2021-09-20: 500 [IU]
  Filled 2021-09-20: qty 5

## 2021-09-20 MED ORDER — SODIUM CHLORIDE 0.9 % IV SOLN
Freq: Once | INTRAVENOUS | Status: AC
  Filled 2021-09-20: qty 250

## 2021-09-20 MED ORDER — HEPARIN SOD (PORK) LOCK FLUSH 100 UNIT/ML IV SOLN
500.0000 [IU] | Freq: Once | INTRAVENOUS | Status: AC | PRN
  Filled 2021-09-20: qty 5

## 2021-09-20 MED ORDER — SODIUM CHLORIDE 0.9 % IV SOLN
1200.0000 mg | Freq: Once | INTRAVENOUS | Status: AC
  Administered 2021-09-20: 1200 mg via INTRAVENOUS
  Filled 2021-09-20: qty 20

## 2021-09-20 NOTE — Progress Notes (Signed)
1025: Creatinine 1.46, Per Mila Homer RPH CrCl is 60 ml/min and okay with 4mg  Zometa. Per Dr. Grayland Ormond okay to proceed with 4 mg Zometa.  ? ?

## 2021-09-20 NOTE — Patient Instructions (Signed)
[facility] CANCER CTR AT [place]  Discharge Instructions: ?Thank you for choosing [facility] to provide your oncology and hematology care.  ?If you have a lab appointment with the [facility], please go directly to the [facility] and check in at the registration area. ? ?Wear comfortable clothing and clothing appropriate for easy access to any Portacath or PICC line.  ? ?We strive to give you quality time with your provider. You may need to reschedule your appointment if you arrive late (15 or more minutes).  Arriving late affects you and other patients whose appointments are after yours.  Also, if you miss three or more appointments without notifying the office, you may be dismissed from the clinic at the provider?s discretion.    ?  ?For prescription refill requests, have your pharmacy contact our office and allow 72 hours for refills to be completed.   ? ?Today you received the following chemotherapy and/or immunotherapy agents Tecentriq     ?  ?To help prevent nausea and vomiting after your treatment, we encourage you to take your nausea medication as directed. ? ?BELOW ARE SYMPTOMS THAT SHOULD BE REPORTED IMMEDIATELY: ?*FEVER GREATER THAN 100.4 F (38 ?C) OR HIGHER ?*CHILLS OR SWEATING ?*NAUSEA AND VOMITING THAT IS NOT CONTROLLED WITH YOUR NAUSEA MEDICATION ?*UNUSUAL SHORTNESS OF BREATH ?*UNUSUAL BRUISING OR BLEEDING ?*URINARY PROBLEMS (pain or burning when urinating, or frequent urination) ?*BOWEL PROBLEMS (unusual diarrhea, constipation, pain near the anus) ?TENDERNESS IN MOUTH AND THROAT WITH OR WITHOUT PRESENCE OF ULCERS (sore throat, sores in mouth, or a toothache) ?UNUSUAL RASH, SWELLING OR PAIN  ?UNUSUAL VAGINAL DISCHARGE OR ITCHING  ? ?Items with * indicate a potential emergency and should be followed up as soon as possible or go to the Emergency Department if any problems should occur. ? ?Please show the CHEMOTHERAPY ALERT CARD or IMMUNOTHERAPY ALERT CARD at check-in to  the Emergency Department and triage nurse. ? ?Should you have questions after your visit or need to cancel or reschedule your appointment, please contact [facility] CANCER [place] AT [place]  [phone] and follow the prompts.  Office hours are 8:00 a.m. to 4:30 p.m. Monday - Friday. Please note that voicemails left after 4:00 p.m. may not be returned until the following business day.  We are closed weekends and major holidays. You have access to a nurse at all times for urgent questions. Please call the main number to the clinic [phone] and follow the prompts. ? ?For any non-urgent questions, you may also contact your provider using MyChart. We now offer e-Visits for anyone 29 and older to request care online for non-urgent symptoms. For details visit [email or web address]. ?  ?Also download the MyChart app! Go to the app store, search "MyChart", open the app, select [facility], and log in with your MyChart username and password. ? ?Due to Covid, a mask is required upon entering the hospital/clinic. If you do not have a mask, one will be given to you upon arrival. For doctor visits, patients may have 1 support person aged 64 or older with them. For treatment visits, patients cannot have anyone with them due to current Covid guidelines and our immunocompromised population.  ? ? ?Zoledronic Acid Injection (Hypercalcemia, Oncology) ?What is this medication? ?ZOLEDRONIC ACID (ZOE le dron ik AS id) slows calcium loss from bones. It high calcium levels in the blood from some kinds of cancer. It may be used in other people at risk for bone loss. ?This medicine may be used for other  purposes; ask your health care provider or pharmacist if you have questions. ?COMMON BRAND NAME(S): Zometa ?What should I tell my care team before I take this medication? ?They need to know if you have any of these conditions: ?cancer ?dehydration ?dental disease ?kidney disease ?liver disease ?low levels of calcium in the  blood ?lung or breathing disease (asthma) ?receiving steroids like dexamethasone or prednisone ?an unusual or allergic reaction to zoledronic acid, other medicines, foods, dyes, or preservatives ?pregnant or trying to get pregnant ?breast-feeding ?How should I use this medication? ?This drug is injected into a vein. It is given by a health care provider in a hospital or clinic setting. ?Talk to your health care provider about the use of this drug in children. Special care may be needed. ?Overdosage: If you think you have taken too much of this medicine contact a poison control center or emergency room at once. ?NOTE: This medicine is only for you. Do not share this medicine with others. ?What if I miss a dose? ?Keep appointments for follow-up doses. It is important not to miss your dose. Call your health care provider if you are unable to keep an appointment. ?What may interact with this medication? ?certain antibiotics given by injection ?NSAIDs, medicines for pain and inflammation, like ibuprofen or naproxen ?some diuretics like bumetanide, furosemide ?teriparatide ?thalidomide ?This list may not describe all possible interactions. Give your health care provider a list of all the medicines, herbs, non-prescription drugs, or dietary supplements you use. Also tell them if you smoke, drink alcohol, or use illegal drugs. Some items may interact with your medicine. ?What should I watch for while using this medication? ?Visit your health care provider for regular checks on your progress. It may be some time before you see the benefit from this drug. ?Some people who take this drug have severe bone, joint, or muscle pain. This drug may also increase your risk for jaw problems or a broken thigh bone. Tell your health care provider right away if you have severe pain in your jaw, bones, joints, or muscles. Tell you health care provider if you have any pain that does not go away or that gets worse. ?Tell your dentist and  dental surgeon that you are taking this drug. You should not have major dental surgery while on this drug. See your dentist to have a dental exam and fix any dental problems before starting this drug. Take good care of your teeth while on this drug. Make sure you see your dentist for regular follow-up appointments. ?You should make sure you get enough calcium and vitamin D while you are taking this drug. Discuss the foods you eat and the vitamins you take with your health care provider. ?Check with your health care provider if you have severe diarrhea, nausea, and vomiting, or if you sweat a lot. The loss of too much body fluid may make it dangerous for you to take this drug. ?You may need blood work done while you are taking this drug. ?Do not become pregnant while taking this drug. Women should inform their health care provider if they wish to become pregnant or think they might be pregnant. There is potential for serious harm to an unborn child. Talk to your health care provider for more information. ?What side effects may I notice from receiving this medication? ?Side effects that you should report to your doctor or health care provider as soon as possible: ?allergic reactions (skin rash, itching or hives; swelling of the face,  lips, or tongue) ?bone pain ?infection (fever, chills, cough, sore throat, pain or trouble passing urine) ?jaw pain, especially after dental work ?joint pain ?kidney injury (trouble passing urine or change in the amount of urine) ?low blood pressure (dizziness; feeling faint or lightheaded, falls; unusually weak or tired) ?low calcium levels (fast heartbeat; muscle cramps or pain; pain, tingling, or numbness in the hands or feet; seizures) ?low magnesium levels (fast, irregular heartbeat; muscle cramp or pain; muscle weakness; tremors; seizures) ?low red blood cell counts (trouble breathing; feeling faint; lightheaded, falls; unusually weak or tired) ?muscle pain ?redness, blistering,  peeling, or loosening of the skin, including inside the mouth ?severe diarrhea ?swelling of the ankles, feet, hands ?trouble breathing ?Side effects that usually do not require medical attention (report to you

## 2021-09-21 ENCOUNTER — Ambulatory Visit
Admission: RE | Admit: 2021-09-21 | Discharge: 2021-09-21 | Disposition: A | Discharge status: Home / Self Care | Payer: Medicaid Other | Source: Ambulatory Visit | Attending: Radiation Oncology | Admitting: Radiation Oncology

## 2021-09-21 DIAGNOSIS — Z510 Encounter for antineoplastic radiation therapy: Secondary | ICD-10-CM | POA: Diagnosis not present

## 2021-09-21 LAB — T4: T4, Total: 4.5 ug/dL (ref 4.5–12.0)

## 2021-09-22 ENCOUNTER — Ambulatory Visit
Admission: RE | Admit: 2021-09-22 | Discharge: 2021-09-22 | Disposition: A | Discharge status: Home / Self Care | Payer: Medicaid Other | Source: Ambulatory Visit | Attending: Radiation Oncology | Admitting: Radiation Oncology

## 2021-09-22 DIAGNOSIS — Z510 Encounter for antineoplastic radiation therapy: Secondary | ICD-10-CM | POA: Diagnosis not present

## 2021-09-23 ENCOUNTER — Ambulatory Visit: Payer: Medicaid Other

## 2021-09-26 ENCOUNTER — Ambulatory Visit: Admission: RE | Admit: 2021-09-26 | Payer: Medicaid Other | Source: Ambulatory Visit

## 2021-09-26 ENCOUNTER — Ambulatory Visit: Payer: Medicaid Other

## 2021-09-26 ENCOUNTER — Ambulatory Visit
Admission: RE | Admit: 2021-09-26 | Discharge: 2021-09-26 | Disposition: A | Discharge status: Home / Self Care | Payer: Medicaid Other | Source: Ambulatory Visit | Attending: Radiation Oncology | Admitting: Radiation Oncology

## 2021-09-26 DIAGNOSIS — Z510 Encounter for antineoplastic radiation therapy: Secondary | ICD-10-CM | POA: Diagnosis not present

## 2021-09-27 ENCOUNTER — Ambulatory Visit: Payer: Medicaid Other

## 2021-09-27 ENCOUNTER — Ambulatory Visit
Admission: RE | Admit: 2021-09-27 | Discharge: 2021-09-27 | Disposition: A | Discharge status: Home / Self Care | Payer: Medicaid Other | Source: Ambulatory Visit | Attending: Radiation Oncology | Admitting: Radiation Oncology

## 2021-09-27 DIAGNOSIS — Z510 Encounter for antineoplastic radiation therapy: Secondary | ICD-10-CM | POA: Diagnosis not present

## 2021-09-28 ENCOUNTER — Ambulatory Visit
Admission: RE | Admit: 2021-09-28 | Discharge: 2021-09-28 | Disposition: A | Discharge status: Home / Self Care | Payer: Medicaid Other | Source: Ambulatory Visit | Attending: Radiation Oncology | Admitting: Radiation Oncology

## 2021-09-28 DIAGNOSIS — Z510 Encounter for antineoplastic radiation therapy: Secondary | ICD-10-CM | POA: Diagnosis not present

## 2021-09-29 ENCOUNTER — Ambulatory Visit
Admission: RE | Admit: 2021-09-29 | Discharge: 2021-09-29 | Disposition: A | Discharge status: Home / Self Care | Payer: Medicaid Other | Source: Ambulatory Visit | Attending: Radiation Oncology | Admitting: Radiation Oncology

## 2021-09-29 ENCOUNTER — Ambulatory Visit: Payer: Medicaid Other

## 2021-09-29 ENCOUNTER — Telehealth: Payer: Self-pay | Admitting: *Deleted

## 2021-09-29 DIAGNOSIS — Z510 Encounter for antineoplastic radiation therapy: Secondary | ICD-10-CM | POA: Diagnosis not present

## 2021-09-29 NOTE — Telephone Encounter (Signed)
Incoming call from patient PCP Dr Coralie Common asking for Dr Baruch Gouty to return her call regarding this patient symptoms and prognosis after completion of therapy [phone] ?

## 2021-09-30 ENCOUNTER — Ambulatory Visit: Payer: Medicaid Other

## 2021-10-07 NOTE — Progress Notes (Signed)
?[place]  ?[phone] [phone] ? ?IDKarma Greaser OB: 1960-09-09  MR#: [id number]  POE#:[id number] ? ?Patient Care Team: ?[facility] as PCP - General ?Telford Nab, RN as Oncology Nurse Navigator ?Lloyd Huger, MD as Consulting Physician (Hematology and Oncology) ? 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  ? ?INTERVAL HISTORY: Patient returns to clinic today for further evaluation and consideration of cycle 4 of Tecentriq.  Her performance status is declining, but she offers no specific complaints today.  She has increased weakness and fatigue.  She has no neurologic complaints.  She denies any recent fevers or illnesses.  She has a fair appetite and denies weight loss.  She has no chest pain, shortness of breath, cough, or hemoptysis.  She denies any nausea, vomiting, constipation, or diarrhea.  She has no urinary complaints.  Patient offers no further specific complaints today. ? ?REVIEW OF SYSTEMS:   ?Review of Systems  ?Constitutional:  Positive for malaise/fatigue. Negative for fever and weight loss.  ?Respiratory: Negative.  Negative for cough, hemoptysis and shortness of breath.   ?Cardiovascular: Negative.  Negative for chest pain and leg swelling.  ?Gastrointestinal: Negative.  Negative for abdominal pain.  ?Genitourinary: Negative.  Negative for dysuria and flank pain.  ?Musculoskeletal:  Negative for back pain and joint pain.  ?Skin: Negative.  Negative for rash.  ?Neurological:  Positive for focal weakness and weakness. Negative for dizziness and headaches.  ?Psychiatric/Behavioral: Negative.  The patient is not nervous/anxious.   ? ?As per HPI. Otherwise, a complete review of systems is negative. ? ?PAST MEDICAL HISTORY: ?Past Medical History:  ?Diagnosis Date  ? Asthma   ? COPD (chronic obstructive pulmonary disease) ([place])   ? CVA (cerebral vascular accident) [facility])  2014  ? Depression   ? Diabetes mellitus ([place])   ? Diabetes mellitus without complication ([place])   ? Hepatitis C   ? History of kidney stones   ? Hyperlipidemia   ? Iron deficiency anemia   ? Obesity   ? OSA on CPAP   ? Mild, noncompliant with CPAP said cpap machine was recalled, is waiting for a replacement   ? Paranoia ([place])   ? Schizoaffective disorder, bipolar type ([place])   ? ? ?PAST SURGICAL HISTORY: ?Past Surgical History:  ?Procedure Laterality Date  ? BREAST CYST EXCISION Bilateral 2013  ? Benign  ? CESAREAN SECTION    ? CHOLECYSTECTOMY    ? COLONOSCOPY    ? ESOPHAGOGASTRODUODENOSCOPY  2020  ? done in [place]  ? INGUINAL HERNIA REPAIR Left 2002  ? INTERCOSTAL NERVE BLOCK N/A 07/12/2020  ? Procedure: INTERCOSTAL NERVE BLOCK;  Surgeon: Melrose Nakayama, MD;  Location: [facility];  Service: Thoracic;  Laterality: N/A;  ? IR IMAGING GUIDED PORT INSERTION  08/20/2020  ? LYMPH NODE DISSECTION Left 07/12/2020  ? Procedure: LYMPH NODE DISSECTION;  Surgeon: Melrose Nakayama, MD;  Location: [facility];  Service: Thoracic;  Laterality: Left;  ? UMBILICAL HERNIA REPAIR    ? VIDEO BRONCHOSCOPY WITH ENDOBRONCHIAL NAVIGATION N/A 06/07/2020  ? Procedure: VIDEO BRONCHOSCOPY WITH ENDOBRONCHIAL NAVIGATION;  Surgeon: Tyler Pita, MD;  Location: [facility];  Service: Pulmonary;  Laterality: N/A;  ? ? ?FAMILY HISTORY: ?History reviewed. No pertinent family history. ? ?ADVANCED DIRECTIVES (Y/N):  N ? ?HEALTH MAINTENANCE: ?Social History  ? ?Tobacco Use  ? Smoking status: Former  ?  Packs/day: 2.00  ?  Years: 38.00  ?  Pack years:  76.00  ?  Types: Cigarettes  ?  Quit date: 2012  ?  Years since quitting: 11.3  ? Smokeless tobacco: Former  ?Vaping Use  ? Vaping Use: Never used  ?Substance Use Topics  ? Alcohol use: Yes  ?  Comment: occassionaly  ? Drug use: Not Currently  ? ? ? Colonoscopy: ? PAP: ? Bone density: ? Lipid panel: ? ?No Known Allergies ? ?Current Outpatient Medications  ?Medication Sig Dispense Refill  ? acetaminophen  (TYLENOL) 650 MG CR tablet Take 650 mg by mouth 2 (two) times daily.    ? albuterol (VENTOLIN HFA) 108 (90 Base) MCG/ACT inhaler Inhale 2 puffs into the lungs every 4 (four) hours as needed for wheezing or shortness of breath.    ? atorvastatin (LIPITOR) 40 MG tablet Take 40 mg by mouth at bedtime.    ? benztropine (COGENTIN) 1 MG tablet Take 1 mg by mouth at bedtime.    ? citalopram (CELEXA) 20 MG tablet Take 20 mg by mouth every morning.    ? clobetasol cream (TEMOVATE) 0.05 % Apply to chest area twice daily as needed for itching/rash (Patient taking differently: Apply 1 application. topically 2 (two) times daily as needed (rash on chest).) 30 g 1  ? dexamethasone (DECADRON) 1 MG/ML solution Take 4 mLs (4 mg total) by mouth every 12 (twelve) hours. 30 mL 0  ? Dulaglutide (TRULICITY) 1.5 LE/7.5TZ SOPN Inject 1.5 mg into the skin every Thursday.    ? Fluticasone-Umeclidin-Vilant (TRELEGY ELLIPTA) 100-62.5-25 MCG/INH AEPB Inhale 1 puff into the lungs daily.    ? folic acid (FOLVITE) 1 MG tablet Take 1 mg by mouth daily.    ? ibuprofen (ADVIL) 400 MG tablet Take 400 mg by mouth 3 (three) times daily. (Take with acetaminophen doses)    ? insulin detemir (LEVEMIR) 100 UNIT/ML injection Inject 0.35 mLs (35 Units total) into the skin daily. 10 mL 11  ? lactulose (CHRONULAC) 10 GM/15ML solution Take 20 g by mouth daily as needed for mild constipation or moderate constipation.    ? latanoprost (XALATAN) 0.005 % ophthalmic solution Place 1 drop into both eyes at bedtime.    ? lidocaine (LIDODERM) 5 % Place 2 patches onto the skin daily. (Apply 1 patch to each foot and remove after 12 hours)    ? lidocaine-prilocaine (EMLA) cream Apply to affected area once (Patient taking differently: Apply 1 application. topically daily as needed (pain prevention).) 30 g 3  ? loperamide (IMODIUM) 2 MG capsule Take 2 mg by mouth 2 (two) times daily as needed for diarrhea or loose stools.    ? LORazepam (ATIVAN) 1 MG tablet Take 1 mg by mouth  2 (two) times daily.    ? ondansetron (ZOFRAN-ODT) 4 MG disintegrating tablet Take 4 mg by mouth every 8 (eight) hours as needed for nausea or vomiting.    ? oxyCODONE (OXY IR/ROXICODONE) 5 MG immediate release tablet Take 1 tablet (5 mg total) by mouth every 6 (six) hours as needed for moderate pain or severe pain. 10 tablet 0  ? pregabalin (LYRICA) 100 MG capsule Take 100 mg by mouth daily.    ? senna-docusate (SENOKOT-S) 8.6-50 MG tablet Take 2 tablets by mouth at bedtime.    ? traMADol HCl 100 MG TABS Take 50 mg by mouth every 6 (six) hours as needed. 10 tablet 0  ? trolamine salicylate (ASPERCREME) 10 % cream Apply 1 application. topically 3 (three) times daily as needed for muscle pain.    ? ziprasidone (  GEODON) 80 MG capsule Take 80 mg by mouth at bedtime.    ? NARCAN 4 MG/0.1ML LIQD nasal spray kit Place 0.4 mg into the nose daily as needed (opioid overdose). (Patient not taking: Reported on 09/20/2021)    ? ?No current facility-administered medications for this visit.  ? ?Facility-Administered Medications Ordered in Other Visits  ?Medication Dose Route Frequency Provider Last Rate Last Admin  ? 0.9 %  sodium chloride infusion   Intravenous Once Grayland Ormond, Kathlene November, MD      ? [facility]) 1,200 mg in sodium chloride 0.9 % 250 mL chemo infusion  1,200 mg Intravenous Once Lloyd Huger, MD      ? heparin lock flush 100 unit/mL  500 Units Intracatheter Once PRN Lloyd Huger, MD      ? ? ?OBJECTIVE: ?Vitals:  ? 10/11/21 0947  ?BP: 121/74  ?Pulse: (!) 106  ?Resp: 16  ?Temp: 97.7 ?F (36.5 ?C)  ?SpO2: 100%  ?   Body mass index is 35.67 kg/m?Marland Kitchen    ECOG FS:3 - Symptomatic, >50% confined to bed ? ?General: Well-developed, well-nourished, no acute distress.  Sitting in a wheelchair. ?Eyes: Pink conjunctiva, anicteric sclera. ?HEENT: Normocephalic, moist mucous membranes. ?Lungs: No audible wheezing or coughing. ?Heart: Regular rate and rhythm. ?Abdomen: Soft, nontender, no obvious  distention. ?Musculoskeletal: No edema, cyanosis, or clubbing. ?Neuro: Alert, answering all questions appropriately. Cranial nerves grossly intact. ?Skin: No rashes or petechiae noted. ?Psych: Normal affect. ? ? ?LAB RESUL

## 2021-10-11 ENCOUNTER — Inpatient Hospital Stay: Payer: Medicaid Other

## 2021-10-11 ENCOUNTER — Encounter: Payer: Self-pay | Admitting: Oncology

## 2021-10-11 ENCOUNTER — Inpatient Hospital Stay: Payer: Medicaid Other | Admitting: Hospice and Palliative Medicine

## 2021-10-11 ENCOUNTER — Inpatient Hospital Stay (HOSPITAL_BASED_OUTPATIENT_CLINIC_OR_DEPARTMENT_OTHER): Payer: Medicaid Other | Admitting: Oncology

## 2021-10-11 VITALS — BP 121/74 | HR 106 | Temp 97.7°F | Resp 16 | Ht 62.0 in | Wt 195.0 lb

## 2021-10-11 DIAGNOSIS — C772 Secondary and unspecified malignant neoplasm of intra-abdominal lymph nodes: Secondary | ICD-10-CM | POA: Insufficient documentation

## 2021-10-11 DIAGNOSIS — C787 Secondary malignant neoplasm of liver and intrahepatic bile duct: Secondary | ICD-10-CM | POA: Diagnosis not present

## 2021-10-11 DIAGNOSIS — C7951 Secondary malignant neoplasm of bone: Secondary | ICD-10-CM | POA: Diagnosis not present

## 2021-10-11 DIAGNOSIS — Z5112 Encounter for antineoplastic immunotherapy: Secondary | ICD-10-CM | POA: Diagnosis present

## 2021-10-11 DIAGNOSIS — C3432 Malignant neoplasm of lower lobe, left bronchus or lung: Secondary | ICD-10-CM

## 2021-10-11 DIAGNOSIS — Z79899 Other long term (current) drug therapy: Secondary | ICD-10-CM | POA: Diagnosis not present

## 2021-10-11 DIAGNOSIS — C7931 Secondary malignant neoplasm of brain: Secondary | ICD-10-CM | POA: Insufficient documentation

## 2021-10-11 LAB — COMPREHENSIVE METABOLIC PANEL
ALT: 73 U/L — ABNORMAL HIGH (ref 0–44)
AST: 79 U/L — ABNORMAL HIGH (ref 15–41)
Albumin: 3.5 g/dL (ref 3.5–5.0)
Alkaline Phosphatase: 227 U/L — ABNORMAL HIGH (ref 38–126)
Anion gap: 10 (ref 5–15)
BUN: 19 mg/dL (ref 8–23)
CO2: 24 mmol/L (ref 22–32)
Calcium: 8.5 mg/dL — ABNORMAL LOW (ref 8.9–10.3)
Chloride: 104 mmol/L (ref 98–111)
Creatinine, Ser: 1.1 mg/dL — ABNORMAL HIGH (ref 0.44–1.00)
GFR, Estimated: 57 mL/min — ABNORMAL LOW (ref >60)
Glucose, Bld: 162 mg/dL — ABNORMAL HIGH (ref 70–99)
Potassium: 3.2 mmol/L — ABNORMAL LOW (ref 3.5–5.1)
Sodium: 138 mmol/L (ref 135–145)
Total Bilirubin: 0.9 mg/dL (ref 0.3–1.2)
Total Protein: 6.6 g/dL (ref 6.5–8.1)

## 2021-10-11 LAB — CBC WITH DIFFERENTIAL/PLATELET
Abs Immature Granulocytes: 0.1 10*3/uL — ABNORMAL HIGH (ref 0.00–0.07)
Basophils Absolute: 0 10*3/uL (ref 0.0–0.1)
Basophils Relative: 0 %
Eosinophils Absolute: 0 10*3/uL (ref 0.0–0.5)
Eosinophils Relative: 0 %
HCT: 29.3 % — ABNORMAL LOW (ref 36.0–46.0)
Hemoglobin: 9.3 g/dL — ABNORMAL LOW (ref 12.0–15.0)
Immature Granulocytes: 2 %
Lymphocytes Relative: 17 %
Lymphs Abs: 0.8 10*3/uL (ref 0.7–4.0)
MCH: 27.5 pg (ref 26.0–34.0)
MCHC: 31.7 g/dL (ref 30.0–36.0)
MCV: 86.7 fL (ref 80.0–100.0)
Monocytes Absolute: 0.3 10*3/uL (ref 0.1–1.0)
Monocytes Relative: 6 %
Neutro Abs: 3.6 10*3/uL (ref 1.7–7.7)
Neutrophils Relative %: 75 %
Platelets: 141 10*3/uL — ABNORMAL LOW (ref 150–400)
RBC: 3.38 MIL/uL — ABNORMAL LOW (ref 3.87–5.11)
RDW: 17.3 % — ABNORMAL HIGH (ref 11.5–15.5)
WBC: 4.9 10*3/uL (ref 4.0–10.5)
nRBC: 0.4 % — ABNORMAL HIGH (ref 0.0–0.2)

## 2021-10-11 LAB — TSH: TSH: 2.09 u[IU]/mL (ref 0.350–4.500)

## 2021-10-11 MED ORDER — SODIUM CHLORIDE 0.9 % IV SOLN
Freq: Once | INTRAVENOUS | Status: AC
  Filled 2021-10-11: qty 250

## 2021-10-11 MED ORDER — HEPARIN SOD (PORK) LOCK FLUSH 100 UNIT/ML IV SOLN
500.0000 [IU] | Freq: Once | INTRAVENOUS | Status: AC | PRN
  Administered 2021-10-11: 500 [IU]
  Filled 2021-10-11: qty 5

## 2021-10-11 MED ORDER — SODIUM CHLORIDE 0.9 % IV SOLN
1200.0000 mg | Freq: Once | INTRAVENOUS | Status: AC
  Administered 2021-10-11: 1200 mg via INTRAVENOUS
  Filled 2021-10-11: qty 20

## 2021-10-11 NOTE — Progress Notes (Signed)
HR 106. Per Dr Grayland Ormond okay to proceed with tx today ?

## 2021-10-11 NOTE — Patient Instructions (Signed)
[facility] CANCER CTR AT [place]  Discharge Instructions: ?Thank you for choosing [facility] to provide your oncology and hematology care.  ?If you have a lab appointment with the [facility], please go directly to the [facility] and check in at the registration area. ? ?Wear comfortable clothing and clothing appropriate for easy access to any Portacath or PICC line.  ? ?We strive to give you quality time with your provider. You may need to reschedule your appointment if you arrive late (15 or more minutes).  Arriving late affects you and other patients whose appointments are after yours.  Also, if you miss three or more appointments without notifying the office, you may be dismissed from the clinic at the provider?s discretion.    ?  ?For prescription refill requests, have your pharmacy contact our office and allow 72 hours for refills to be completed.   ? ?Today you received the following chemotherapy and/or immunotherapy agents: Tecentriq ?    ?  ?To help prevent nausea and vomiting after your treatment, we encourage you to take your nausea medication as directed. ? ?BELOW ARE SYMPTOMS THAT SHOULD BE REPORTED IMMEDIATELY: ?*FEVER GREATER THAN 100.4 F (38 ?C) OR HIGHER ?*CHILLS OR SWEATING ?*NAUSEA AND VOMITING THAT IS NOT CONTROLLED WITH YOUR NAUSEA MEDICATION ?*UNUSUAL SHORTNESS OF BREATH ?*UNUSUAL BRUISING OR BLEEDING ?*URINARY PROBLEMS (pain or burning when urinating, or frequent urination) ?*BOWEL PROBLEMS (unusual diarrhea, constipation, pain near the anus) ?TENDERNESS IN MOUTH AND THROAT WITH OR WITHOUT PRESENCE OF ULCERS (sore throat, sores in mouth, or a toothache) ?UNUSUAL RASH, SWELLING OR PAIN  ?UNUSUAL VAGINAL DISCHARGE OR ITCHING  ? ?Items with * indicate a potential emergency and should be followed up as soon as possible or go to the Emergency Department if any problems should occur. ? ?Please show the CHEMOTHERAPY ALERT CARD or IMMUNOTHERAPY ALERT CARD at check-in  to the Emergency Department and triage nurse. ? ?Should you have questions after your visit or need to cancel or reschedule your appointment, please contact [facility] CANCER [place] AT [place]  [phone] and follow the prompts.  Office hours are 8:00 a.m. to 4:30 p.m. Monday - Friday. Please note that voicemails left after 4:00 p.m. may not be returned until the following business day.  We are closed weekends and major holidays. You have access to a nurse at all times for urgent questions. Please call the main number to the clinic [phone] and follow the prompts. ? ?For any non-urgent questions, you may also contact your provider using MyChart. We now offer e-Visits for anyone 29 and older to request care online for non-urgent symptoms. For details visit [email or web address]. ?  ?Also download the MyChart app! Go to the app store, search "MyChart", open the app, select [facility], and log in with your MyChart username and password. ? ?Due to Covid, a mask is required upon entering the hospital/clinic. If you do not have a mask, one will be given to you upon arrival. For doctor visits, patients may have 1 support person aged 100 or older with them. For treatment visits, patients cannot have anyone with them due to current Covid guidelines and our immunocompromised population. Atezolizumab injection ?What is this medication? ?ATEZOLIZUMAB (a te zoe LIZ ue mab) is a monoclonal antibody. It is used to treat bladder cancer (urothelial cancer), liver cancer, lung cancer, and melanoma. ?This medicine may be used for other purposes; ask your health care provider or pharmacist if you have questions. ?COMMON BRAND NAME(S): Tecentriq ?What  should I tell my care team before I take this medication? ?They need to know if you have any of these conditions: ?autoimmune diseases like Crohn's disease, ulcerative colitis, or lupus ?have had or planning to have an allogeneic stem cell transplant (uses someone else's  stem cells) ?history of organ transplant ?history of radiation to the chest ?nervous system problems like myasthenia gravis or Guillain-Barre syndrome ?an unusual or allergic reaction to atezolizumab, other medicines, foods, dyes, or preservatives ?pregnant or trying to get pregnant ?breast-feeding ?How should I use this medication? ?This medicine is for infusion into a vein. It is given by a health care professional in a hospital or clinic setting. ?A special MedGuide will be given to you before each treatment. Be sure to read this information carefully each time. ?Talk to your pediatrician regarding the use of this medicine in children. Special care may be needed. ?Overdosage: If you think you have taken too much of this medicine contact a poison control center or emergency room at once. ?NOTE: This medicine is only for you. Do not share this medicine with others. ?What if I miss a dose? ?It is important not to miss your dose. Call your doctor or health care professional if you are unable to keep an appointment. ?What may interact with this medication? ?Interactions have not been studied. ?This list may not describe all possible interactions. Give your health care provider a list of all the medicines, herbs, non-prescription drugs, or dietary supplements you use. Also tell them if you smoke, drink alcohol, or use illegal drugs. Some items may interact with your medicine. ?What should I watch for while using this medication? ?Your condition will be monitored carefully while you are receiving this medicine. ?You may need blood work done while you are taking this medicine. ?Do not become pregnant while taking this medicine or for at least 5 months after stopping it. Women should inform their doctor if they wish to become pregnant or think they might be pregnant. There is a potential for serious side effects to an unborn child. Talk to your health care professional or pharmacist for more information. Do not  breast-feed an infant while taking this medicine or for at least 5 months after the last dose. ?What side effects may I notice from receiving this medication? ?Side effects that you should report to your doctor or health care professional as soon as possible: ?allergic reactions like skin rash, itching or hives, swelling of the face, lips, or tongue ?black, tarry stools ?bloody or watery diarrhea ?breathing problems ?changes in vision ?chest pain or chest tightness ?chills ?facial flushing ?fever ?headache ?signs and symptoms of high blood sugar such as dizziness; dry mouth; dry skin; fruity breath; nausea; stomach pain; increased hunger or thirst; increased urination ?signs and symptoms of liver injury like dark yellow or brown urine; general ill feeling or flu-like symptoms; light-colored stools; loss of appetite; nausea; right upper belly pain; unusually weak or tired; yellowing of the eyes or skin ?stomach pain ?trouble passing urine or change in the amount of urine ?Side effects that usually do not require medical attention (report to your doctor or health care professional if they continue or are bothersome): ?bone pain ?cough ?diarrhea ?joint pain ?muscle pain ?muscle weakness ?swelling of arms or legs ?tiredness ?weight loss ?This list may not describe all possible side effects. Call your doctor for medical advice about side effects. You may report side effects to FDA at 1-800-FDA-1088. ?Where should I keep my medication? ?This drug is  given in a hospital or clinic and will not be stored at home. ?NOTE: This sheet is a summary. It may not cover all possible information. If you have questions about this medicine, talk to your doctor, pharmacist, or health care provider. ?? 2023 Elsevier/Gold Standard (2021-05-06 00:00:00) ? ?

## 2021-10-12 LAB — T4: T4, Total: 7.6 ug/dL (ref 4.5–12.0)

## 2021-10-14 ENCOUNTER — Ambulatory Visit (HOSPITAL_COMMUNITY)
Admission: RE | Admit: 2021-10-14 | Discharge: 2021-10-14 | Disposition: A | Discharge status: Home / Self Care | Payer: Medicaid Other | Source: Ambulatory Visit | Attending: Family Medicine | Admitting: Family Medicine

## 2021-10-14 ENCOUNTER — Encounter (HOSPITAL_COMMUNITY): Payer: Self-pay

## 2021-10-14 DIAGNOSIS — C3490 Malignant neoplasm of unspecified part of unspecified bronchus or lung: Secondary | ICD-10-CM

## 2021-10-14 NOTE — Progress Notes (Deleted)
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   INTERVAL HISTORY: Patient returns to clinic today for further evaluation and consideration of cycle 4 of Tecentriq.  Her performance status is declining, but she offers no specific complaints today.  She has increased weakness and fatigue.  She has no neurologic complaints.  She denies any recent fevers or illnesses.  She has a fair appetite and denies weight loss.  She has no chest pain, shortness of breath, cough, or hemoptysis.  She denies any nausea, vomiting, constipation, or diarrhea.  She has no urinary complaints.  Patient offers no further specific complaints today.  REVIEW OF SYSTEMS:   Review of Systems  Constitutional:  Positive for malaise/fatigue. Negative for fever and weight loss.  Respiratory: Negative.  Negative for cough, hemoptysis and shortness of breath.   Cardiovascular: Negative.  Negative for chest pain and leg swelling.  Gastrointestinal: Negative.  Negative for abdominal pain.  Genitourinary: Negative.  Negative for dysuria and flank pain.  Musculoskeletal:  Negative for back pain and joint pain.  Skin: Negative.  Negative for rash.  Neurological:  Positive for focal weakness and weakness. Negative for dizziness and headaches.  Psychiatric/Behavioral: Negative.  The patient is not nervous/anxious.    As per HPI. Otherwise, a complete review of systems is negative.  PAST MEDICAL HISTORY: Past Medical History:  Diagnosis Date   Asthma    COPD (chronic obstructive pulmonary disease) ([place])    CVA (cerebral vascular accident) ([place])  2014   Depression    Diabetes mellitus ([place])    Diabetes mellitus without complication ([place])    Hepatitis C    History of kidney stones    Hyperlipidemia    Iron deficiency anemia    Obesity    OSA on CPAP    Mild, noncompliant with CPAP said cpap machine was recalled, is waiting for a replacement    Paranoia ([place])    Schizoaffective disorder, bipolar type ([place])     PAST SURGICAL HISTORY: Past Surgical History:  Procedure Laterality Date   BREAST CYST EXCISION Bilateral 2013   Benign   CESAREAN SECTION     CHOLECYSTECTOMY     COLONOSCOPY     ESOPHAGOGASTRODUODENOSCOPY  2020   done in [place] Left 2002   INTERCOSTAL NERVE BLOCK N/A 07/12/2020   Procedure: INTERCOSTAL NERVE BLOCK;  Surgeon: Melrose Nakayama, MD;  Location: [place];  Service: Thoracic;  Laterality: N/A;   IR IMAGING GUIDED PORT INSERTION  08/20/2020   LYMPH NODE DISSECTION Left 07/12/2020   Procedure: LYMPH NODE DISSECTION;  Surgeon: Melrose Nakayama, MD;  Location: [place];  Service: Thoracic;  Laterality: Left;   UMBILICAL HERNIA REPAIR     VIDEO BRONCHOSCOPY WITH ENDOBRONCHIAL NAVIGATION N/A 06/07/2020   Procedure: VIDEO BRONCHOSCOPY WITH ENDOBRONCHIAL NAVIGATION;  Surgeon: Tyler Pita, MD;  Location: [facility];  Service: Pulmonary;  Laterality: N/A;    FAMILY HISTORY: No family history on file.  ADVANCED DIRECTIVES (Y/N):  N  HEALTH MAINTENANCE: Social History   Tobacco Use   Smoking status: Former    Packs/day: 2.00    Years: 38.00    Pack years: 76.00  Types: Cigarettes    Quit date: 2012    Years since quitting: 11.3   Smokeless tobacco: Former  Scientific laboratory technician Use: Never used  Substance Use Topics   Alcohol use: Yes    Comment: occassionaly   Drug use: Not Currently     Colonoscopy:  PAP:  Bone density:  Lipid panel:  No Known Allergies  Current Outpatient Medications  Medication Sig Dispense Refill   acetaminophen (TYLENOL) 650 MG CR tablet  Take 650 mg by mouth 2 (two) times daily.     albuterol (VENTOLIN HFA) 108 (90 Base) MCG/ACT inhaler Inhale 2 puffs into the lungs every 4 (four) hours as needed for wheezing or shortness of breath.     atorvastatin (LIPITOR) 40 MG tablet Take 40 mg by mouth at bedtime.     benztropine (COGENTIN) 1 MG tablet Take 1 mg by mouth at bedtime.     citalopram (CELEXA) 20 MG tablet Take 20 mg by mouth every morning.     clobetasol cream (TEMOVATE) 0.05 % Apply to chest area twice daily as needed for itching/rash (Patient taking differently: Apply 1 application. topically 2 (two) times daily as needed (rash on chest).) 30 g 1   dexamethasone (DECADRON) 1 MG/ML solution Take 4 mLs (4 mg total) by mouth every 12 (twelve) hours. 30 mL 0   Dulaglutide (TRULICITY) 1.5 ZO/1.0RU SOPN Inject 1.5 mg into the skin every Thursday.     Fluticasone-Umeclidin-Vilant (TRELEGY ELLIPTA) 100-62.5-25 MCG/INH AEPB Inhale 1 puff into the lungs daily.     folic acid (FOLVITE) 1 MG tablet Take 1 mg by mouth daily.     ibuprofen (ADVIL) 400 MG tablet Take 400 mg by mouth 3 (three) times daily. (Take with acetaminophen doses)     insulin detemir (LEVEMIR) 100 UNIT/ML injection Inject 0.35 mLs (35 Units total) into the skin daily. 10 mL 11   lactulose (CHRONULAC) 10 GM/15ML solution Take 20 g by mouth daily as needed for mild constipation or moderate constipation.     latanoprost (XALATAN) 0.005 % ophthalmic solution Place 1 drop into both eyes at bedtime.     lidocaine (LIDODERM) 5 % Place 2 patches onto the skin daily. (Apply 1 patch to each foot and remove after 12 hours)     lidocaine-prilocaine (EMLA) cream Apply to affected area once (Patient taking differently: Apply 1 application. topically daily as needed (pain prevention).) 30 g 3   loperamide (IMODIUM) 2 MG capsule Take 2 mg by mouth 2 (two) times daily as needed for diarrhea or loose stools.     LORazepam (ATIVAN) 1 MG tablet Take 1 mg by mouth 2 (two) times daily.      NARCAN 4 MG/0.1ML LIQD nasal spray kit Place 0.4 mg into the nose daily as needed (opioid overdose). (Patient not taking: Reported on 09/20/2021)     ondansetron (ZOFRAN-ODT) 4 MG disintegrating tablet Take 4 mg by mouth every 8 (eight) hours as needed for nausea or vomiting.     oxyCODONE (OXY IR/ROXICODONE) 5 MG immediate release tablet Take 1 tablet (5 mg total) by mouth every 6 (six) hours as needed for moderate pain or severe pain. 10 tablet 0   pregabalin (LYRICA) 100 MG capsule Take 100 mg by mouth daily.     senna-docusate (SENOKOT-S) 8.6-50 MG tablet Take 2 tablets by mouth at bedtime.     traMADol HCl 100 MG TABS Take 50 mg by mouth every 6 (six) hours as needed. 10 tablet 0  trolamine salicylate (ASPERCREME) 10 % cream Apply 1 application. topically 3 (three) times daily as needed for muscle pain.     ziprasidone (GEODON) 80 MG capsule Take 80 mg by mouth at bedtime.     No current facility-administered medications for this visit.    OBJECTIVE: There were no vitals filed for this visit.    There is no height or weight on file to calculate BMI.    ECOG FS:3 - Symptomatic, >50% confined to bed  General: Well-developed, well-nourished, no acute distress.  Sitting in a wheelchair. Eyes: Pink conjunctiva, anicteric sclera. HEENT: Normocephalic, moist mucous membranes. Lungs: No audible wheezing or coughing. Heart: Regular rate and rhythm. Abdomen: Soft, nontender, no obvious distention. Musculoskeletal: No edema, cyanosis, or clubbing. Neuro: Alert, answering all questions appropriately. Cranial nerves grossly intact. Skin: No rashes or petechiae noted. Psych: Normal affect.   LAB RESULTS:  Lab Results  Component Value Date   NA 138 10/11/2021   K 3.2 (L) 10/11/2021   CL 104 10/11/2021   CO2 24 10/11/2021   GLUCOSE 162 (H) 10/11/2021   BUN 19 10/11/2021   CREATININE 1.10 (H) 10/11/2021   CALCIUM 8.5 (L) 10/11/2021   PROT 6.6 10/11/2021   ALBUMIN 3.5 10/11/2021   AST 79  (H) 10/11/2021   ALT 73 (H) 10/11/2021   ALKPHOS 227 (H) 10/11/2021   BILITOT 0.9 10/11/2021   GFRNONAA 57 (L) 10/11/2021   GFRAA >60 01/30/2020    Lab Results  Component Value Date   WBC 4.9 10/11/2021   NEUTROABS 3.6 10/11/2021   HGB 9.3 (L) 10/11/2021   HCT 29.3 (L) 10/11/2021   MCV 86.7 10/11/2021   PLT 141 (L) 10/11/2021     STUDIES: No results found.  ONCOLOGY HISTORY: PET scan results from May 06, 2020 reviewed independently with hypermetabolic left lower lobe lung mass and biopsy confirmed malignancy.  Patient also noted to have a right iliac bony hypermetabolism as well as a soft tissue mass adjacent suspicious for isolated metastasis.  Soft tissue mass was biopsied on May 19, 2020 which was negative for malignancy.  Patient underwent resection on July 12, 2020 which revealed 4 positive lymph nodes.  MRI of the brain on August 25, 2020 was negative for metastatic disease.  She completed adjuvant cisplatin and pemetrexed on Oct 28, 2020.  Repeat PET scan results from January 27, 2021 reviewed independently with progressive disease with new metastatic adenopathy in the chest, liver metastasis, and multifocal bone metastasis.  Given her EGFR mutation was initiated on Tagrisso in September 2022.  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  PLAN:    1.  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  Patient wished to continue systemic treatment, therefore Tagrisso was discontinued.  She has a positive PD-L1, therefore she was switched to Tecentriq every 3 weeks.  Proceed with cycle 4 of treatment today.  Given her declining performance status, we also discussed possible  hospice, but patient does not wish to pursue this at this time.  She did agree to further discuss after her PET scan which is scheduled for October 14, 2021.  Return to clinic in 1 week to discuss the results of the PET scan and whether or not  to continue treatments.  Appreciate palliative care input.   2.  Brain metastasis: Patient has now completed XRT.  Continue Decadron taper. 3.  Anemia: Hemoglobin is trended down to 9.3, monitor. 4.  Hyperglycemia: Patient has improved blood glucose control.  Continue tapering Decadron as above. 5.  Neuropathy: Chronic and unchanged.  Continue Lyrica as prescribed. 6.  Bony lesions: Continue with Zometa on the odd-numbered cycles of Tecentriq.   7.  Renal insufficiency: Improved.  Patient creatinine is 1.1 today.   8.  Leg pain/weakness: Chronic and unchanged.  Continue physical therapy as ordered.  Patient receives oxycodone from her primary care physician. 9.  Leukocytosis: Resolved.   10.  Transaminitis: Slightly worse.  Proceed with treatment as above.  PET scan as above. 11.  Hypokalemia: Mild, monitor.  Patient expressed understanding and was in agreement with this plan. She also understands that She can call clinic at any time with any questions, concerns, or complaints.    Cancer Staging  Primary adenocarcinoma of lower lobe of left lung [facility]) Staging form: Lung, AJCC 8th Edition - Pathologic stage from 07/27/2020: Stage IVB (pT2b, pN1, cM1c) - Signed by Lloyd Huger, MD on 02/02/2021   Lloyd Huger, MD   10/14/2021 4:02 PM

## 2021-10-20 ENCOUNTER — Inpatient Hospital Stay: Payer: Medicaid Other | Admitting: Oncology

## 2021-10-20 ENCOUNTER — Inpatient Hospital Stay: Payer: Medicaid Other | Admitting: Hospice and Palliative Medicine

## 2021-10-20 DIAGNOSIS — C3432 Malignant neoplasm of lower lobe, left bronchus or lung: Secondary | ICD-10-CM

## 2021-10-22 NOTE — Progress Notes (Signed)
?Natalie [name]  ?[phone] [phone] ? ?IDKarma Greaser OB: 11-20-1960  MR#: [id number]  ZJQ#:[id number] ? ?Patient Care Team: ?Center, [facility] as PCP - General ?Telford Nab, RN as Oncology Nurse Navigator ?Lloyd Huger, MD as Consulting Physician (Hematology and Oncology) ? 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  ? ?INTERVAL HISTORY: Patient returns to clinic today for further evaluation and discussion of her imaging results.  Her performance status continues to be decreased and she has chronic weakness and fatigue.  She has no neurologic complaints.  She denies any recent fevers or illnesses.  She has a fair appetite and denies weight loss.  She has no chest pain, shortness of breath, cough, or hemoptysis.  She denies any nausea, vomiting, constipation, or diarrhea.  She has no urinary complaints.  Patient offers no further specific complaints today. ? ?REVIEW OF SYSTEMS:   ?Review of Systems  ?Constitutional:  Positive for malaise/fatigue. Negative for fever and weight loss.  ?Respiratory: Negative.  Negative for cough, hemoptysis and shortness of breath.   ?Cardiovascular: Negative.  Negative for chest pain and leg swelling.  ?Gastrointestinal: Negative.  Negative for abdominal pain.  ?Genitourinary: Negative.  Negative for dysuria and flank pain.  ?Musculoskeletal:  Negative for back pain and joint pain.  ?Skin: Negative.  Negative for rash.  ?Neurological:  Positive for focal weakness and weakness. Negative for dizziness and headaches.  ?Psychiatric/Behavioral: Negative.  The patient is not nervous/anxious.   ? ?As per HPI. Otherwise, a complete review of systems is negative. ? ?PAST MEDICAL HISTORY: ?Past Medical History:  ?Diagnosis Date  ? Asthma   ? COPD (chronic obstructive pulmonary disease) ([place])   ? CVA (cerebral vascular accident) [facility]) 2014  ? Depression   ? Diabetes  mellitus ([place])   ? Diabetes mellitus without complication ([place])   ? Hepatitis C   ? History of kidney stones   ? Hyperlipidemia   ? Iron deficiency anemia   ? Obesity   ? OSA on CPAP   ? Mild, noncompliant with CPAP said cpap machine was recalled, is waiting for a replacement   ? Paranoia ([place])   ? Schizoaffective disorder, bipolar type ([place])   ? ? ?PAST SURGICAL HISTORY: ?Past Surgical History:  ?Procedure Laterality Date  ? BREAST CYST EXCISION Bilateral 2013  ? Benign  ? CESAREAN SECTION    ? CHOLECYSTECTOMY    ? COLONOSCOPY    ? ESOPHAGOGASTRODUODENOSCOPY  2020  ? done in [place]  ? INGUINAL HERNIA REPAIR Left 2002  ? INTERCOSTAL NERVE BLOCK N/A 07/12/2020  ? Procedure: INTERCOSTAL NERVE BLOCK;  Surgeon: Melrose Nakayama, MD;  Location: [place];  Service: Thoracic;  Laterality: N/A;  ? IR IMAGING GUIDED PORT INSERTION  08/20/2020  ? LYMPH NODE DISSECTION Left 07/12/2020  ? Procedure: LYMPH NODE DISSECTION;  Surgeon: Melrose Nakayama, MD;  Location: [place];  Service: Thoracic;  Laterality: Left;  ? UMBILICAL HERNIA REPAIR    ? VIDEO BRONCHOSCOPY WITH ENDOBRONCHIAL NAVIGATION N/A 06/07/2020  ? Procedure: VIDEO BRONCHOSCOPY WITH ENDOBRONCHIAL NAVIGATION;  Surgeon: Tyler Pita, MD;  Location: [facility];  Service: Pulmonary;  Laterality: N/A;  ? ? ?FAMILY HISTORY: ?History reviewed. No pertinent family history. ? ?ADVANCED DIRECTIVES (Y/N):  N ? ?HEALTH MAINTENANCE: ?Social History  ? ?Tobacco Use  ? Smoking status: Former  ?  Packs/day: 2.00  ?  Years: 38.00  ?  Pack years: 76.00  ?  Types: Cigarettes  ?  Quit date: 2012  ?  Years since quitting: 11.3  ? Smokeless tobacco: Former  ?Vaping Use  ? Vaping Use: Never used  ?Substance Use Topics  ? Alcohol use: Yes  ?  Comment: occassionaly  ? Drug use: Not Currently  ? ? ? Colonoscopy: ? PAP: ? Bone density: ? Lipid panel: ? ?No Known Allergies ? ?Current Outpatient Medications  ?Medication Sig Dispense Refill  ? acetaminophen (TYLENOL) 650 MG CR tablet Take 650 mg  by mouth 2 (two) times daily.    ? albuterol (VENTOLIN HFA) 108 (90 Base) MCG/ACT inhaler Inhale 2 puffs into the lungs every 4 (four) hours as needed for wheezing or shortness of breath.    ? atorvastatin (LIPITOR) 40 MG tablet Take 40 mg by mouth at bedtime.    ? benztropine (COGENTIN) 1 MG tablet Take 1 mg by mouth at bedtime.    ? citalopram (CELEXA) 20 MG tablet Take 20 mg by mouth every morning.    ? clobetasol cream (TEMOVATE) 0.05 % Apply to chest area twice daily as needed for itching/rash (Patient taking differently: Apply 1 application. topically 2 (two) times daily as needed (rash on chest).) 30 g 1  ? Dulaglutide (TRULICITY) 1.5 ZO/1.0RU SOPN Inject 1.5 mg into the skin every Thursday.    ? Fluticasone-Umeclidin-Vilant (TRELEGY ELLIPTA) 100-62.5-25 MCG/INH AEPB Inhale 1 puff into the lungs daily.    ? folic acid (FOLVITE) 1 MG tablet Take 1 mg by mouth daily.    ? ibuprofen (ADVIL) 400 MG tablet Take 400 mg by mouth 3 (three) times daily. (Take with acetaminophen doses)    ? insulin detemir (LEVEMIR) 100 UNIT/ML injection Inject 0.35 mLs (35 Units total) into the skin daily. 10 mL 11  ? latanoprost (XALATAN) 0.005 % ophthalmic solution Place 1 drop into both eyes at bedtime.    ? lidocaine (LIDODERM) 5 % Place 2 patches onto the skin daily. (Apply 1 patch to each foot and remove after 12 hours)    ? lidocaine-prilocaine (EMLA) cream Apply to affected area once (Patient taking differently: Apply 1 application. topically daily as needed (pain prevention).) 30 g 3  ? LORazepam (ATIVAN) 1 MG tablet Take 1 mg by mouth 2 (two) times daily.    ? oxyCODONE (OXY IR/ROXICODONE) 5 MG immediate release tablet Take 1 tablet (5 mg total) by mouth every 6 (six) hours as needed for moderate pain or severe pain. 10 tablet 0  ? pregabalin (LYRICA) 100 MG capsule Take 100 mg by mouth daily.    ? senna-docusate (SENOKOT-S) 8.6-50 MG tablet Take 2 tablets by mouth at bedtime.    ? traMADol HCl 100 MG TABS Take 50 mg by  mouth every 6 (six) hours as needed. 10 tablet 0  ? trolamine salicylate (ASPERCREME) 10 % cream Apply 1 application. topically 3 (three) times daily as needed for muscle pain.    ? ziprasidone (GEODON) 80 MG capsule Take 80 mg by mouth at bedtime.    ? dexamethasone (DECADRON) 1 MG/ML solution Take 4 mLs (4 mg total) by mouth every 12 (twelve) hours. (Patient not taking: Reported on 10/28/2021) 30 mL 0  ? lactulose (CHRONULAC) 10 GM/15ML solution Take 20 g by mouth daily as needed for mild constipation or moderate constipation. (Patient not taking: Reported on 10/28/2021)    ? loperamide (IMODIUM) 2 MG capsule Take 2 mg by mouth 2 (two) times daily as needed for diarrhea or loose stools. (Patient not taking: Reported on 10/28/2021)    ? NARCAN  4 MG/0.1ML LIQD nasal spray kit Place 0.4 mg into the nose daily as needed (opioid overdose). (Patient not taking: Reported on 09/20/2021)    ? ondansetron (ZOFRAN-ODT) 4 MG disintegrating tablet Take 4 mg by mouth every 8 (eight) hours as needed for nausea or vomiting. (Patient not taking: Reported on 10/28/2021)    ? ?No current facility-administered medications for this visit.  ? ? ?OBJECTIVE: ?Vitals:  ? 10/28/21 1029  ?BP: (!) 130/56  ?Pulse: 70  ?Resp: 16  ?Temp: (!) 96 ?F (35.6 ?C)  ?SpO2: 100%  ?   There is no height or weight on file to calculate BMI.    ECOG FS:3 - Symptomatic, >50% confined to bed ? ?General: Well-developed, well-nourished, no acute distress.  Sitting in a wheelchair. ?Eyes: Pink conjunctiva, anicteric sclera. ?HEENT: Normocephalic, moist mucous membranes. ?Lungs: No audible wheezing or coughing. ?Heart: Regular rate and rhythm. ?Abdomen: Soft, nontender, no obvious distention. ?Musculoskeletal: No edema, cyanosis, or clubbing. ?Neuro: Alert, answering all questions appropriately. Cranial nerves grossly intact. ?Skin: No rashes or petechiae noted. ?Psych: Normal affect. ? ?LAB RESULTS: ? ?Lab Results  ?Component Value Date  ? NA 138 10/11/2021  ? K 3.2  (L) 10/11/2021  ? CL 104 10/11/2021  ? CO2 24 10/11/2021  ? GLUCOSE 162 (H) 10/11/2021  ? BUN 19 10/11/2021  ? CREATININE 1.10 (H) 10/11/2021  ? CALCIUM 8.5 (L) 10/11/2021  ? PROT 6.6 10/11/2021  ? ALBUMIN 3.5

## 2021-10-25 ENCOUNTER — Ambulatory Visit
Admission: RE | Admit: 2021-10-25 | Discharge: 2021-10-25 | Disposition: A | Discharge status: Home / Self Care | Payer: Medicaid Other | Source: Ambulatory Visit | Attending: Family Medicine | Admitting: Family Medicine

## 2021-10-25 DIAGNOSIS — R911 Solitary pulmonary nodule: Secondary | ICD-10-CM | POA: Insufficient documentation

## 2021-10-25 DIAGNOSIS — C3490 Malignant neoplasm of unspecified part of unspecified bronchus or lung: Secondary | ICD-10-CM | POA: Diagnosis present

## 2021-10-25 LAB — GLUCOSE, CAPILLARY: Glucose-Capillary: 56 mg/dL — ABNORMAL LOW (ref 70–99)

## 2021-10-25 IMAGING — CT NM PET TUM IMG RESTAG (PS) SKULL BASE T - THIGH
7 series · 25 of 25 positions shown · non-contrast
Comparison: None Available.

CLINICAL DATA: Subsequent treatment strategy for non-small cell
lung cancer. Brain metastasis recent radiation therapy

EXAM:
NUCLEAR MEDICINE PET SKULL BASE TO THIGH
TECHNIQUE: 10.5 mCi F-18 FDG was injected intravenously. Full-ring PET imaging
was performed from the skull base to thigh after the radiotracer. CT
data was obtained and used for attenuation correction and anatomic
localization.
Fasting blood glucose: 56 mg/dl

[Series 2: ct slices · axial · 3.8mm · 1.37mm/px · z∈[-791,-52]mm · 5 of 227 slices shown]
[im 1/227]
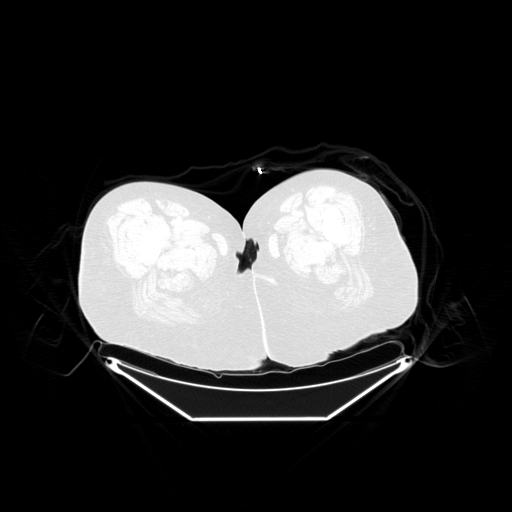
[im 57/227]
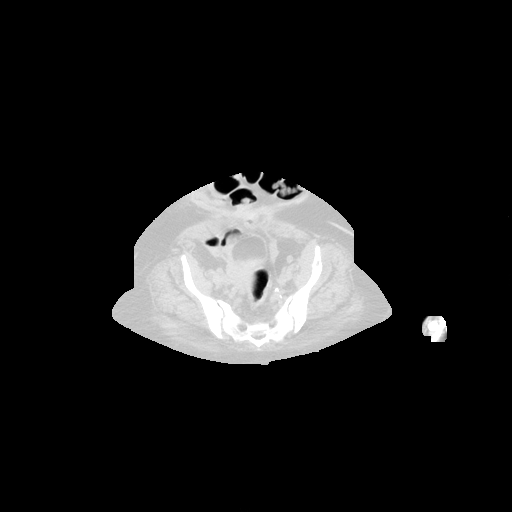
[im 114/227  brain]
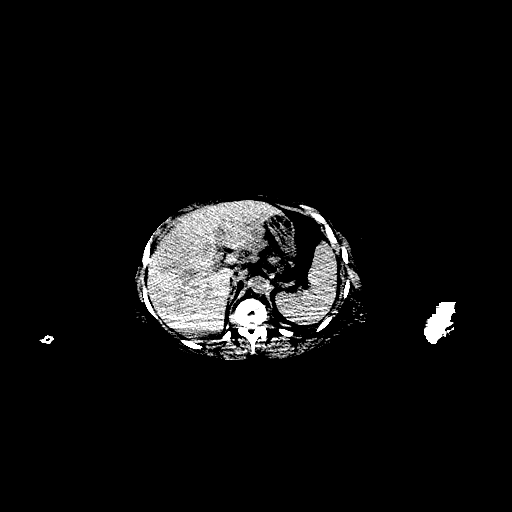
[im 170/227]
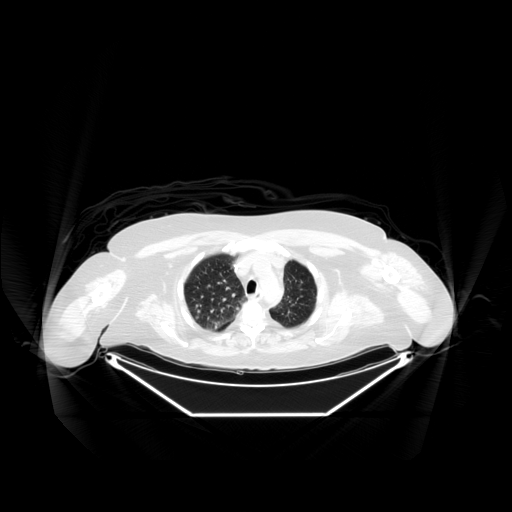
[im 227/227]
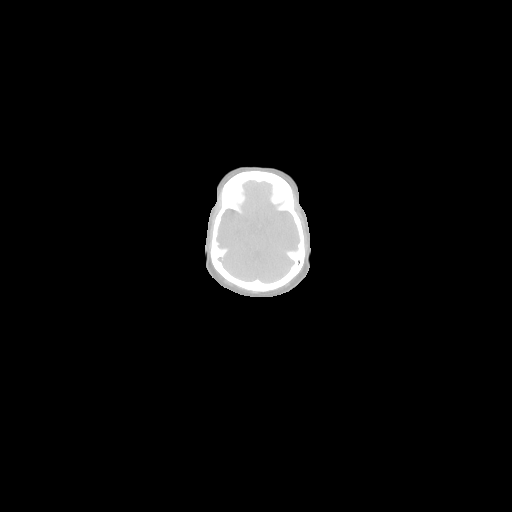

[Series 3: pet ac 3d body · axial · 3.3mm · 5.47mm/px · z∈[-790,-51]mm · 5 of 227 slices shown]
[im 1/227]
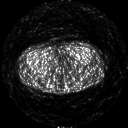
[im 57/227]
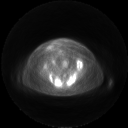
[im 114/227]
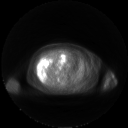
[im 170/227]
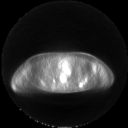
[im 227/227]
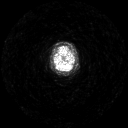

[Series 4: pet nac 3d body · axial · 3.3mm · 5.47mm/px · z∈[-790,-51]mm · 5 of 227 slices shown]
[im 1/227]
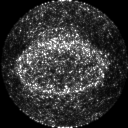
[im 57/227]
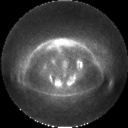
[im 114/227]
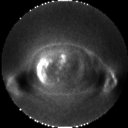
[im 170/227]
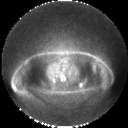
[im 227/227]
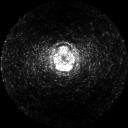

[Series 303: pet axial · axial · 3.3mm · 5.47mm/px · z∈[-791,-51]mm · 5 of 227 slices shown]
[im 1/227]
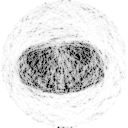
[im 57/227]
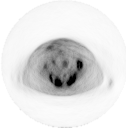
[im 114/227]
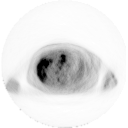
[im 170/227]
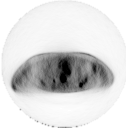
[im 227/227]
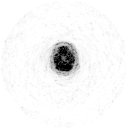

[Series 304: pet sagittal · sagittal · 5.5mm · 5.94mm/px · 3 of 122 slices shown]
[im 1/122]
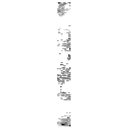
[im 61/122]
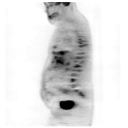
[im 122/122]
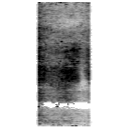

[Series 305: pet coronal · coronal · 5.5mm · 5.94mm/px · 1 of 65 slices shown]
[im 1/65]
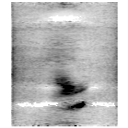

[Series 8591: results mm oncology reading · 1.4mm · 1.20mm/px · 1 of 15 slices shown]
[im 1/15]
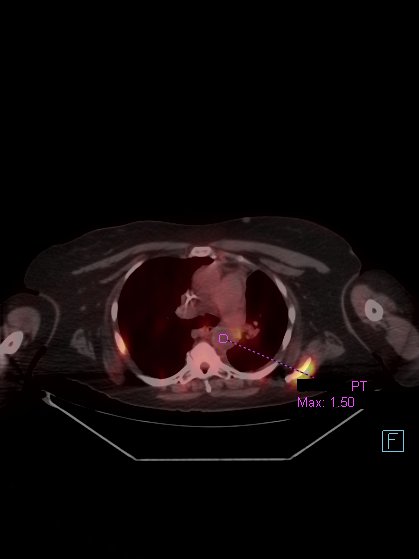

[25 of 25 positions shown; findings below may reference images not displayed]

FINDINGS: Mediastinal blood pool activity: SUV max

Liver activity: SUV max NA

NECK: No hypermetabolic lymph nodes in the neck.

Incidental CT findings: none

CHEST: New hypermetabolic prevascular lymph node measures 13 mm
short axis (image 55/2) with SUV max 8.7.

Hypermetabolic ill-defined perihilar thickening in the LEFT hilum
measuring approximately 3.7 cm with SUV max equal 7.3 (image 73
compares SUV max equal 6.5.

Enlargement of hypermetabolic nodule in the LEFT upper lobe
measuring 12 mm (image 61/2) increased from 8 point 0 mm with SUV
max equal 7.6 (image 62)

Incidental CT findings: none

ABDOMEN/PELVIS: Progression of hepatic metastasis. Previous discrete
lesions in the RIGHT hepatic lobe are are now a confluent
hypermetabolic mass measuring up to 16 cm x 9 cm with SUV max equal
14.9. Similar confluence of nodules in the lateral segment of the
LEFT hepatic lobe now measuring up to 7.8 cm with SUV max equal 14.

Enlargement of hypermetabolic lesion in inferior RIGHT hepatic lobe
additionally.

Hypermetabolic gastrohepatic ligament node measuring 19 mm with SUV
max equal 18.3 is new from prior.

Incidental CT findings: none

SKELETON: Cortical destructive lesion in the LEFT iliac wing with
SUV max equal 9.9 compared SUV max equal 10.8. Intense metabolic
activity within the RIGHT sacral ala with SUV max equal 9.7.

Multiple sites through the spine with intense metabolic activity
similar prior. Nearly every vertebral body is involved. Underlying
sclerotic lesions on CT.

Metastatic disease to the LEFT scapula similar prior with SUV max
equal in a 11.5 compared SUV max equal 6.

Progression in size and activity in the proximal RIGHT humerus with
SUV max equal 11.5 compared SUV max equal 5.9.

Soft tissue metastasis in the LEFT flank musculature measures 2.8 cm
compared to 2.4 cm (image 103) with SUV max equal 8.0 compared SUV
max equal 7.1.

Incidental CT findings: none
IMPRESSION: 1. Progression of non-small cell lung cancer.
2. Marked progression within the liver with confluence of previously
metastatic lesions into large confluent lobar lesions. New
hypermetabolic gastrohepatic lymph node.
3. New hypermetabolic mediastinal adenopathy and enlargement
hypermetabolic LEFT upper lobe pulmonary nodule.
4. Progression of skeletal metastasis with increased metabolic
activity in size of several lesions. Widespread skeletal metastasis
with involvement nearly vertebral body.

## 2021-10-25 MED ORDER — FLUDEOXYGLUCOSE F - 18 (FDG) INJECTION
10.1000 | Freq: Once | INTRAVENOUS | Status: AC | PRN
  Administered 2021-10-25: 10.47 via INTRAVENOUS

## 2021-10-28 ENCOUNTER — Encounter: Payer: Self-pay | Admitting: Oncology

## 2021-10-28 ENCOUNTER — Telehealth: Payer: Self-pay | Admitting: Radiation Oncology

## 2021-10-28 ENCOUNTER — Inpatient Hospital Stay (HOSPITAL_BASED_OUTPATIENT_CLINIC_OR_DEPARTMENT_OTHER): Payer: Medicaid Other | Admitting: Hospice and Palliative Medicine

## 2021-10-28 ENCOUNTER — Inpatient Hospital Stay: Payer: Medicaid Other | Attending: Oncology | Admitting: Oncology

## 2021-10-28 ENCOUNTER — Ambulatory Visit: Payer: Medicaid Other | Admitting: Radiation Oncology

## 2021-10-28 VITALS — BP 130/56 | HR 70 | Temp 96.0°F | Resp 16

## 2021-10-28 DIAGNOSIS — C787 Secondary malignant neoplasm of liver and intrahepatic bile duct: Secondary | ICD-10-CM | POA: Diagnosis not present

## 2021-10-28 DIAGNOSIS — C3432 Malignant neoplasm of lower lobe, left bronchus or lung: Secondary | ICD-10-CM | POA: Diagnosis present

## 2021-10-28 DIAGNOSIS — C7931 Secondary malignant neoplasm of brain: Secondary | ICD-10-CM | POA: Insufficient documentation

## 2021-10-28 DIAGNOSIS — Z515 Encounter for palliative care: Secondary | ICD-10-CM

## 2021-10-28 DIAGNOSIS — C7951 Secondary malignant neoplasm of bone: Secondary | ICD-10-CM | POA: Diagnosis not present

## 2021-10-28 NOTE — Telephone Encounter (Signed)
Per [facility] rep, called saying Provider at facility stated that pt can cancell appt.Cherylann Banas  ?

## 2021-10-28 NOTE — Progress Notes (Signed)
Patient was seen this morning by Dr. Grayland Ormond.  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  Patient has had declining performance status and is felt to have exhausted treatment options.  Comfort care/hospice was recommended and agreed to by patient and son. ? ?I called and spoke with [facility] physician, Dr. Ovid Curd, who will coordinate end-of-life care. ? ?Case and plan discussed with Dr. Grayland Ormond. ? ?No charge note. ?

## 2021-11-02 ENCOUNTER — Ambulatory Visit: Payer: Medicaid Other | Admitting: Radiation Oncology

## 2021-12-17 DEATH — deceased
